# Patient Record
Sex: Female | Born: 1985 | Race: Black or African American | Hispanic: No | Marital: Single | Smoking: Current some day smoker
Health system: Southern US, Community
[De-identification: ages and names within clinical notes are randomized; demographics above are authoritative.]

## PROBLEM LIST (undated history)

## (undated) ENCOUNTER — Inpatient Hospital Stay (HOSPITAL_COMMUNITY): Payer: Self-pay

## (undated) DIAGNOSIS — A749 Chlamydial infection, unspecified: Secondary | ICD-10-CM

## (undated) DIAGNOSIS — F4310 Post-traumatic stress disorder, unspecified: Secondary | ICD-10-CM

## (undated) DIAGNOSIS — B009 Herpesviral infection, unspecified: Secondary | ICD-10-CM

## (undated) DIAGNOSIS — N200 Calculus of kidney: Secondary | ICD-10-CM

## (undated) DIAGNOSIS — B999 Unspecified infectious disease: Secondary | ICD-10-CM

## (undated) DIAGNOSIS — O98211 Gonorrhea complicating pregnancy, first trimester: Secondary | ICD-10-CM

## (undated) DIAGNOSIS — N739 Female pelvic inflammatory disease, unspecified: Secondary | ICD-10-CM

## (undated) DIAGNOSIS — F319 Bipolar disorder, unspecified: Secondary | ICD-10-CM

## (undated) DIAGNOSIS — F32A Depression, unspecified: Secondary | ICD-10-CM

## (undated) DIAGNOSIS — R87619 Unspecified abnormal cytological findings in specimens from cervix uteri: Secondary | ICD-10-CM

## (undated) DIAGNOSIS — O98811 Other maternal infectious and parasitic diseases complicating pregnancy, first trimester: Secondary | ICD-10-CM

## (undated) DIAGNOSIS — N83209 Unspecified ovarian cyst, unspecified side: Secondary | ICD-10-CM

## (undated) DIAGNOSIS — L732 Hidradenitis suppurativa: Secondary | ICD-10-CM

## (undated) DIAGNOSIS — K056 Periodontal disease, unspecified: Secondary | ICD-10-CM

## (undated) DIAGNOSIS — Z8669 Personal history of other diseases of the nervous system and sense organs: Secondary | ICD-10-CM

## (undated) DIAGNOSIS — L309 Dermatitis, unspecified: Secondary | ICD-10-CM

## (undated) DIAGNOSIS — F329 Major depressive disorder, single episode, unspecified: Secondary | ICD-10-CM

## (undated) DIAGNOSIS — Z202 Contact with and (suspected) exposure to infections with a predominantly sexual mode of transmission: Secondary | ICD-10-CM

## (undated) DIAGNOSIS — IMO0002 Reserved for concepts with insufficient information to code with codable children: Secondary | ICD-10-CM

## (undated) DIAGNOSIS — Z8619 Personal history of other infectious and parasitic diseases: Secondary | ICD-10-CM

## (undated) HISTORY — DX: Personal history of other diseases of the nervous system and sense organs: Z86.69

## (undated) HISTORY — DX: Personal history of other infectious and parasitic diseases: Z86.19

## (undated) HISTORY — PX: NO PAST SURGERIES: SHX2092

## (undated) HISTORY — DX: Chlamydial infection, unspecified: A74.9

## (undated) HISTORY — DX: Major depressive disorder, single episode, unspecified: F32.9

## (undated) HISTORY — DX: Reserved for concepts with insufficient information to code with codable children: IMO0002

## (undated) HISTORY — DX: Unspecified abnormal cytological findings in specimens from cervix uteri: R87.619

## (undated) HISTORY — DX: Herpesviral infection, unspecified: B00.9

## (undated) HISTORY — DX: Gonorrhea complicating pregnancy, first trimester: O98.211

## (undated) HISTORY — DX: Other maternal infectious and parasitic diseases complicating pregnancy, first trimester: O98.811

## (undated) HISTORY — DX: Depression, unspecified: F32.A

---
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
## 2004-01-30 ENCOUNTER — Emergency Department (HOSPITAL_COMMUNITY): Admission: EM | Admit: 2004-01-30 | Discharge: 2004-01-30 | Payer: Self-pay | Admitting: Emergency Medicine

## 2004-04-11 ENCOUNTER — Emergency Department (HOSPITAL_COMMUNITY): Admission: EM | Admit: 2004-04-11 | Discharge: 2004-04-11 | Payer: Self-pay | Admitting: Family Medicine

## 2005-08-01 ENCOUNTER — Inpatient Hospital Stay (HOSPITAL_COMMUNITY): Admission: AD | Admit: 2005-08-01 | Discharge: 2005-08-01 | Payer: Self-pay | Admitting: Obstetrics & Gynecology

## 2005-11-19 ENCOUNTER — Emergency Department (HOSPITAL_COMMUNITY): Admission: EM | Admit: 2005-11-19 | Discharge: 2005-11-19 | Payer: Self-pay | Admitting: Family Medicine

## 2006-08-08 ENCOUNTER — Emergency Department (HOSPITAL_COMMUNITY): Admission: EM | Admit: 2006-08-08 | Discharge: 2006-08-08 | Payer: Self-pay | Admitting: Emergency Medicine

## 2007-05-21 ENCOUNTER — Emergency Department (HOSPITAL_COMMUNITY): Admission: EM | Admit: 2007-05-21 | Discharge: 2007-05-21 | Payer: Self-pay | Admitting: Family Medicine

## 2007-05-22 ENCOUNTER — Inpatient Hospital Stay (HOSPITAL_COMMUNITY): Admission: AD | Admit: 2007-05-22 | Discharge: 2007-05-22 | Payer: Self-pay | Admitting: Obstetrics

## 2007-09-02 ENCOUNTER — Emergency Department (HOSPITAL_COMMUNITY): Admission: EM | Admit: 2007-09-02 | Discharge: 2007-09-02 | Payer: Self-pay | Admitting: Emergency Medicine

## 2007-11-18 ENCOUNTER — Emergency Department (HOSPITAL_COMMUNITY): Admission: EM | Admit: 2007-11-18 | Discharge: 2007-11-18 | Payer: Self-pay | Admitting: Family Medicine

## 2007-12-16 ENCOUNTER — Emergency Department (HOSPITAL_COMMUNITY): Admission: EM | Admit: 2007-12-16 | Discharge: 2007-12-16 | Payer: Self-pay | Admitting: Emergency Medicine

## 2008-08-18 ENCOUNTER — Emergency Department (HOSPITAL_COMMUNITY): Admission: EM | Admit: 2008-08-18 | Discharge: 2008-08-18 | Payer: Self-pay | Admitting: Emergency Medicine

## 2008-09-08 ENCOUNTER — Emergency Department (HOSPITAL_COMMUNITY): Admission: EM | Admit: 2008-09-08 | Discharge: 2008-09-08 | Payer: Self-pay | Admitting: Emergency Medicine

## 2009-04-20 ENCOUNTER — Inpatient Hospital Stay (HOSPITAL_COMMUNITY): Admission: AD | Admit: 2009-04-20 | Discharge: 2009-04-20 | Payer: Self-pay | Admitting: Obstetrics

## 2009-09-07 ENCOUNTER — Emergency Department (HOSPITAL_COMMUNITY): Admission: EM | Admit: 2009-09-07 | Discharge: 2009-09-08 | Payer: Self-pay | Admitting: Emergency Medicine

## 2010-07-15 ENCOUNTER — Inpatient Hospital Stay (HOSPITAL_COMMUNITY)
Admission: AD | Admit: 2010-07-15 | Discharge: 2010-07-15 | Disposition: A | Discharge status: Home / Self Care | Payer: Medicaid Other | Source: Ambulatory Visit | Attending: Obstetrics | Admitting: Obstetrics

## 2010-07-15 DIAGNOSIS — N760 Acute vaginitis: Secondary | ICD-10-CM | POA: Insufficient documentation

## 2010-07-15 DIAGNOSIS — N949 Unspecified condition associated with female genital organs and menstrual cycle: Secondary | ICD-10-CM | POA: Insufficient documentation

## 2010-07-15 DIAGNOSIS — A6000 Herpesviral infection of urogenital system, unspecified: Secondary | ICD-10-CM | POA: Insufficient documentation

## 2010-07-15 LAB — WET PREP, GENITAL: Yeast Wet Prep HPF POC: NONE SEEN

## 2010-07-16 LAB — GC/CHLAMYDIA PROBE AMP, GENITAL
Chlamydia, DNA Probe: NEGATIVE
GC Probe Amp, Genital: NEGATIVE

## 2010-09-05 LAB — URINALYSIS, ROUTINE W REFLEX MICROSCOPIC
Nitrite: NEGATIVE
Specific Gravity, Urine: 1.028 (ref 1.005–1.030)
Urobilinogen, UA: 0.2 mg/dL (ref 0.0–1.0)

## 2010-09-05 LAB — GC/CHLAMYDIA PROBE AMP, GENITAL
Chlamydia, DNA Probe: NEGATIVE
GC Probe Amp, Genital: NEGATIVE

## 2010-09-05 LAB — URINE MICROSCOPIC-ADD ON

## 2010-09-05 LAB — WET PREP, GENITAL

## 2010-09-15 LAB — URINALYSIS, ROUTINE W REFLEX MICROSCOPIC
Glucose, UA: NEGATIVE mg/dL
Leukocytes, UA: NEGATIVE
Protein, ur: NEGATIVE mg/dL
Specific Gravity, Urine: 1.025 (ref 1.005–1.030)
pH: 5.5 (ref 5.0–8.0)

## 2010-09-15 LAB — DIFFERENTIAL
Basophils Absolute: 0.1 10*3/uL (ref 0.0–0.1)
Basophils Relative: 2 % — ABNORMAL HIGH (ref 0–1)
Eosinophils Relative: 1 % (ref 0–5)
Monocytes Absolute: 0.5 10*3/uL (ref 0.1–1.0)
Neutro Abs: 2.8 10*3/uL (ref 1.7–7.7)

## 2010-09-15 LAB — CBC
Hemoglobin: 14.1 g/dL (ref 12.0–15.0)
MCHC: 33.9 g/dL (ref 30.0–36.0)
Platelets: 254 10*3/uL (ref 150–400)
RDW: 12.9 % (ref 11.5–15.5)

## 2010-09-15 LAB — URINE MICROSCOPIC-ADD ON

## 2010-09-15 LAB — WET PREP, GENITAL: Yeast Wet Prep HPF POC: NONE SEEN

## 2010-09-15 LAB — GC/CHLAMYDIA PROBE AMP, GENITAL: GC Probe Amp, Genital: NEGATIVE

## 2010-09-23 LAB — COMPREHENSIVE METABOLIC PANEL
ALT: 31 U/L (ref 0–35)
Alkaline Phosphatase: 92 U/L (ref 39–117)
CO2: 25 mEq/L (ref 19–32)
Glucose, Bld: 109 mg/dL — ABNORMAL HIGH (ref 70–99)
Potassium: 3.9 mEq/L (ref 3.5–5.1)
Sodium: 137 mEq/L (ref 135–145)
Total Protein: 8.1 g/dL (ref 6.0–8.3)

## 2010-09-23 LAB — URINALYSIS, ROUTINE W REFLEX MICROSCOPIC
Glucose, UA: NEGATIVE mg/dL
pH: 6 (ref 5.0–8.0)

## 2010-09-23 LAB — DIFFERENTIAL
Basophils Relative: 0 % (ref 0–1)
Eosinophils Absolute: 0 10*3/uL (ref 0.0–0.7)
Monocytes Relative: 4 % (ref 3–12)
Neutrophils Relative %: 80 % — ABNORMAL HIGH (ref 43–77)

## 2010-09-23 LAB — URINE MICROSCOPIC-ADD ON

## 2010-09-23 LAB — GLUCOSE, CAPILLARY

## 2010-09-23 LAB — POCT PREGNANCY, URINE: Preg Test, Ur: NEGATIVE

## 2010-09-23 LAB — RAPID URINE DRUG SCREEN, HOSP PERFORMED: Barbiturates: NOT DETECTED

## 2010-09-23 LAB — CBC
Hemoglobin: 15.7 g/dL — ABNORMAL HIGH (ref 12.0–15.0)
RBC: 5.14 MIL/uL — ABNORMAL HIGH (ref 3.87–5.11)
RDW: 12.7 % (ref 11.5–15.5)
WBC: 7 10*3/uL (ref 4.0–10.5)

## 2010-12-05 LAB — RPR: RPR: NONREACTIVE

## 2010-12-05 LAB — GC/CHLAMYDIA PROBE AMP, GENITAL: Chlamydia: NEGATIVE

## 2010-12-13 ENCOUNTER — Inpatient Hospital Stay (HOSPITAL_COMMUNITY)
Admission: AD | Admit: 2010-12-13 | Discharge: 2010-12-13 | Disposition: A | Discharge status: Home / Self Care | Payer: Medicaid Other | Source: Ambulatory Visit | Attending: Obstetrics | Admitting: Obstetrics

## 2010-12-13 DIAGNOSIS — O209 Hemorrhage in early pregnancy, unspecified: Secondary | ICD-10-CM

## 2010-12-13 LAB — CBC
MCH: 30 pg (ref 26.0–34.0)
MCHC: 34 g/dL (ref 30.0–36.0)
MCV: 88.2 fL (ref 78.0–100.0)
Platelets: 245 10*3/uL (ref 150–400)
RDW: 12.5 % (ref 11.5–15.5)

## 2010-12-14 LAB — RH IMMUNE GLOBULIN WORKUP (NOT WOMEN'S HOSP): Unit division: 0

## 2011-02-21 ENCOUNTER — Encounter (HOSPITAL_COMMUNITY): Payer: Self-pay | Admitting: *Deleted

## 2011-02-21 ENCOUNTER — Inpatient Hospital Stay (HOSPITAL_COMMUNITY)
Admission: AD | Admit: 2011-02-21 | Discharge: 2011-02-21 | Disposition: A | Discharge status: Home / Self Care | Payer: Medicaid Other | Source: Ambulatory Visit | Attending: Obstetrics | Admitting: Obstetrics

## 2011-02-21 DIAGNOSIS — R109 Unspecified abdominal pain: Secondary | ICD-10-CM

## 2011-02-21 DIAGNOSIS — O26899 Other specified pregnancy related conditions, unspecified trimester: Secondary | ICD-10-CM

## 2011-02-21 DIAGNOSIS — O4700 False labor before 37 completed weeks of gestation, unspecified trimester: Secondary | ICD-10-CM | POA: Insufficient documentation

## 2011-02-21 LAB — URINALYSIS, ROUTINE W REFLEX MICROSCOPIC
Nitrite: NEGATIVE
Protein, ur: NEGATIVE mg/dL
Specific Gravity, Urine: >1.03 — ABNORMAL HIGH (ref 1.005–1.030)
Urobilinogen, UA: 1 mg/dL (ref 0.0–1.0)

## 2011-02-21 NOTE — ED Provider Notes (Signed)
History   Pt presents today via EMS c/o ctx that started about 1hr ago. She denis vag dc or bleeding. She reports GFM. Her last episode of intercourse was last night.   Chief Complaint  Patient presents with  . Abdominal Pain   HPI  OB History    Grav Para Term Preterm Abortions TAB SAB Ect Mult Living   1               No past medical history on file.  No past surgical history on file.  No family history on file.  History  Substance Use Topics  . Smoking status: Not on file  . Smokeless tobacco: Not on file  . Alcohol Use: Not on file    Allergies: Allergies not on file  No prescriptions prior to admission    Review of Systems  Constitutional: Negative for fever.  Cardiovascular: Negative for chest pain.  Gastrointestinal: Positive for abdominal pain. Negative for nausea, vomiting, diarrhea and constipation.  Genitourinary: Negative for dysuria, urgency, frequency and hematuria.  Neurological: Negative for dizziness and headaches.  Psychiatric/Behavioral: Negative for depression and suicidal ideas.   Physical Exam   Blood pressure 114/61, pulse 79, temperature 98.2 F (36.8 C), temperature source Oral, resp. rate 20.  Physical Exam  Constitutional: She is oriented to person, place, and time. She appears well-developed and well-nourished. No distress.  HENT:  Head: Normocephalic and atraumatic.  Eyes: EOM are normal. Pupils are equal, round, and reactive to light.  GI: Soft. She exhibits no distension. There is no tenderness. There is no rebound and no guarding.  Genitourinary: No bleeding around the vagina. No vaginal discharge found.       Cervix is Lg/closed and firm.  Neurological: She is alert and oriented to person, place, and time.  Skin: Skin is warm and dry. She is not diaphoretic.  Psychiatric: She has a normal mood and affect. Her behavior is normal. Judgment and thought content normal.    MAU Course  Procedures  NST shows no ctx.  Results  for orders placed during the hospital encounter of 02/21/11 (from the past 48 hour(s))  URINALYSIS, ROUTINE W REFLEX MICROSCOPIC     Status: Abnormal   Collection Time   02/21/11  4:29 PM      Component Value Range Comment   Color, Urine YELLOW  YELLOW     Appearance CLEAR  CLEAR     Specific Gravity, Urine >1.030 (*) 1.005 - 1.030     pH 6.0  5.0 - 8.0     Glucose, UA NEGATIVE  NEGATIVE (mg/dL)    Hgb urine dipstick NEGATIVE  NEGATIVE     Bilirubin Urine NEGATIVE  NEGATIVE     Ketones, ur NEGATIVE  NEGATIVE (mg/dL)    Protein, ur NEGATIVE  NEGATIVE (mg/dL)    Urobilinogen, UA 1.0  0.0 - 1.0 (mg/dL)    Nitrite NEGATIVE  NEGATIVE     Leukocytes, UA NEGATIVE  NEGATIVE  MICROSCOPIC NOT DONE ON URINES WITH NEGATIVE PROTEIN, BLOOD, LEUKOCYTES, NITRITE, OR GLUCOSE <1000 mg/dL.     Assessment and Plan  Abd pain: no evidence of PTL at this time. Pt sx most likely secondary to recent intercourse. Pt sx have now completely resolved. She has f/u scheduled. Discussed diet, activity, risks, and precautions.  Clinton Gallant. Tyrek Lawhorn III, DrHSc, MPAS, PA-C  02/21/2011, 4:18 PM   Henrietta Hoover, PA 02/21/11 1654

## 2011-02-21 NOTE — ED Notes (Signed)
Bedside US confirms vtx position

## 2011-02-21 NOTE — Progress Notes (Signed)
States started having pain around 3 p.m.  No bleeding or leaking.  Had intercourse last nighj.  Came in by EMS

## 2011-03-07 LAB — COMPREHENSIVE METABOLIC PANEL
ALT: 27
AST: 27
Albumin: 4.3
Alkaline Phosphatase: 98
CO2: 22
Chloride: 102
Creatinine, Ser: 0.91
GFR calc Af Amer: >60
GFR calc non Af Amer: >60
Potassium: 3.1 — ABNORMAL LOW
Sodium: 135
Total Bilirubin: 1.1

## 2011-03-07 LAB — DIFFERENTIAL
Basophils Absolute: 0
Basophils Relative: 0
Eosinophils Absolute: 0
Eosinophils Relative: 0
Lymphocytes Relative: 14
Monocytes Absolute: 0.4

## 2011-03-07 LAB — URINALYSIS, ROUTINE W REFLEX MICROSCOPIC
Hgb urine dipstick: NEGATIVE
Ketones, ur: 40 — AB
Protein, ur: 30 — AB
Urobilinogen, UA: 0.2

## 2011-03-07 LAB — LIPASE, BLOOD: Lipase: 25

## 2011-03-07 LAB — POCT PREGNANCY, URINE: Operator id: 108131

## 2011-03-07 LAB — URINE MICROSCOPIC-ADD ON

## 2011-03-07 LAB — CBC
MCV: 86.9
Platelets: 259
RBC: 5.02
WBC: 7.5

## 2011-03-10 LAB — URINALYSIS, ROUTINE W REFLEX MICROSCOPIC
Bilirubin Urine: NEGATIVE
Hgb urine dipstick: NEGATIVE
Ketones, ur: 40 — AB
Nitrite: NEGATIVE
Specific Gravity, Urine: 1.041 — ABNORMAL HIGH
Urobilinogen, UA: 0.2

## 2011-03-10 LAB — POCT I-STAT, CHEM 8
BUN: 14
Calcium, Ion: 1.09 — ABNORMAL LOW
Chloride: 100
HCT: 51 — ABNORMAL HIGH
Sodium: 135
TCO2: 26

## 2011-03-10 LAB — POCT PREGNANCY, URINE
Operator id: 261601
Preg Test, Ur: NEGATIVE

## 2011-03-10 LAB — URINE MICROSCOPIC-ADD ON

## 2011-03-21 LAB — POCT URINALYSIS DIP (DEVICE)
Hgb urine dipstick: NEGATIVE
Nitrite: NEGATIVE
Urobilinogen, UA: 2 — ABNORMAL HIGH
pH: 6.5

## 2011-03-21 LAB — POCT PREGNANCY, URINE
Operator id: 116391
Preg Test, Ur: NEGATIVE

## 2011-03-21 LAB — WET PREP, GENITAL

## 2011-04-12 ENCOUNTER — Other Ambulatory Visit: Payer: Self-pay | Admitting: Obstetrics

## 2011-04-20 ENCOUNTER — Inpatient Hospital Stay (HOSPITAL_COMMUNITY)
Admission: AD | Admit: 2011-04-20 | Discharge: 2011-04-20 | Disposition: A | Discharge status: Home / Self Care | Payer: Medicaid Other | Source: Ambulatory Visit | Attending: Obstetrics | Admitting: Obstetrics

## 2011-04-20 DIAGNOSIS — Z3480 Encounter for supervision of other normal pregnancy, unspecified trimester: Secondary | ICD-10-CM | POA: Insufficient documentation

## 2011-04-20 DIAGNOSIS — Z298 Encounter for other specified prophylactic measures: Secondary | ICD-10-CM | POA: Insufficient documentation

## 2011-04-20 DIAGNOSIS — Z2989 Encounter for other specified prophylactic measures: Secondary | ICD-10-CM | POA: Insufficient documentation

## 2011-04-20 MED ORDER — RHO D IMMUNE GLOBULIN 1500 UNIT/2ML IJ SOLN
300.0000 ug | Freq: Once | INTRAMUSCULAR | Status: AC
  Administered 2011-04-20: 300 ug via INTRAMUSCULAR
  Filled 2011-04-20: qty 2

## 2011-04-20 NOTE — Plan of Care (Signed)
Rhophylac information sheet given to the patient waiting in the lobby. Explained the time required to process the order of 1 1/2 hours. Patient understands.

## 2011-04-21 LAB — RH IG WORKUP (INCLUDES ABO/RH): Antibody Screen: NEGATIVE

## 2011-05-22 ENCOUNTER — Inpatient Hospital Stay (HOSPITAL_COMMUNITY)
Admission: AD | Admit: 2011-05-22 | Discharge: 2011-05-23 | Disposition: A | Discharge status: Home / Self Care | Payer: Medicaid Other | Source: Ambulatory Visit | Attending: Obstetrics | Admitting: Obstetrics

## 2011-05-22 DIAGNOSIS — L02219 Cutaneous abscess of trunk, unspecified: Secondary | ICD-10-CM | POA: Insufficient documentation

## 2011-05-22 DIAGNOSIS — O99891 Other specified diseases and conditions complicating pregnancy: Secondary | ICD-10-CM | POA: Insufficient documentation

## 2011-05-22 DIAGNOSIS — L03319 Cellulitis of trunk, unspecified: Secondary | ICD-10-CM | POA: Insufficient documentation

## 2011-05-22 DIAGNOSIS — O4700 False labor before 37 completed weeks of gestation, unspecified trimester: Secondary | ICD-10-CM | POA: Insufficient documentation

## 2011-05-22 DIAGNOSIS — L02214 Cutaneous abscess of groin: Secondary | ICD-10-CM

## 2011-05-22 MED ORDER — CEPHALEXIN 500 MG PO CAPS
500.0000 mg | ORAL_CAPSULE | Freq: Once | ORAL | Status: AC
  Administered 2011-05-22: 500 mg via ORAL
  Filled 2011-05-22: qty 1

## 2011-05-22 MED ORDER — OXYCODONE-ACETAMINOPHEN 5-325 MG PO TABS
2.0000 | ORAL_TABLET | Freq: Once | ORAL | Status: AC
  Administered 2011-05-22: 2 via ORAL
  Filled 2011-05-22: qty 2

## 2011-05-22 NOTE — Progress Notes (Signed)
Pt c/o pain in right groin.  States she felt it coming on a few days ago and it got worse today; says she can't walk or close her legs.

## 2011-05-22 NOTE — ED Provider Notes (Signed)
History     No chief complaint on file.  HPI This is a 25 y.o. female at [redacted]w[redacted]d who presents via EMS with c/o painful right groin lesion. States it started two days ago and has gotten progressively worse. Also complains of pelvic pressure and frequency.    OB History    Grav Para Term Preterm Abortions TAB SAB Ect Mult Living   1               No past medical history on file.  No past surgical history on file.  No family history on file.  History  Substance Use Topics  . Smoking status: Not on file  . Smokeless tobacco: Not on file  . Alcohol Use: Not on file    Allergies: No Known Allergies  Prescriptions prior to admission  Medication Sig Dispense Refill  . prenatal vitamin w/FE, FA (PRENATAL 1 + 1) 27-1 MG TABS Take 1 tablet by mouth daily.        . valACYclovir (VALTREX) 1000 MG tablet Take 500 mg by mouth daily.          ROS As above  Physical Exam   There were no vitals taken for this visit.  Physical Exam  Constitutional: She is oriented to person, place, and time. She appears well-developed and well-nourished.  HENT:  Head: Normocephalic.  Cardiovascular: Normal rate.   Respiratory: Effort normal.  GI: Soft.  Genitourinary: Vagina normal and uterus normal. No vaginal discharge found.        FHR reactive UCs irregular  Musculoskeletal: Normal range of motion.  Neurological: She is alert and oriented to person, place, and time.  Skin: Skin is warm and dry.       There is a quarter sized abscess on Right Groin which is firm and not fluctuant. There is no point to it. + erethema. Slightly elongated.   Psychiatric: She has a normal mood and affect.    MAU Course  Procedures  MDM   Assessment and Plan  A:  Right Groin Abscess      Contractions and pelvic discomfort related to late pregnancy P:  D/C home per Dr Gaynell Face      Rx Keflex 500mg  qid x 7 days      Rx Percocet      Given one dose of each while here      Follow up in  office  [facility] 05/22/2011, 11:24 PM

## 2011-05-23 ENCOUNTER — Encounter (HOSPITAL_COMMUNITY): Payer: Self-pay | Admitting: Advanced Practice Midwife

## 2011-05-23 MED ORDER — OXYCODONE-ACETAMINOPHEN 5-325 MG PO TABS: 1.0000 | ORAL_TABLET | ORAL | Status: AC | PRN

## 2011-05-23 MED ORDER — CEPHALEXIN 500 MG PO CAPS: 500.0000 mg | ORAL_CAPSULE | Freq: Four times a day (QID) | ORAL | Status: AC

## 2011-06-14 NOTE — L&D Delivery Note (Signed)
Delivery Note At 3:53 PM a viable female was delivered via Vaginal, Spontaneous Delivery (Presentation: Middle Occiput Anterior).  APGAR: 8, 9; weight 7 lb 1.1 oz (3205 g).   Placenta status: , .  Cord:  with the following complications: None.  Cord pH: not done  Anesthesia: Epidural  Episiotomy:  Lacerations:  Suture Repair: 2.0 Est. Blood Loss (mL):   Mom to postpartum.  Baby to nursery-stable.  Nobuo Nunziata A 06/28/2011, 4:05 PM

## 2011-06-23 ENCOUNTER — Encounter (HOSPITAL_COMMUNITY): Payer: Self-pay | Admitting: *Deleted

## 2011-06-23 ENCOUNTER — Telehealth (HOSPITAL_COMMUNITY): Payer: Self-pay | Admitting: *Deleted

## 2011-06-23 NOTE — Telephone Encounter (Signed)
Preadmission screen  

## 2011-06-28 ENCOUNTER — Inpatient Hospital Stay (HOSPITAL_COMMUNITY)
Admission: AD | Admit: 2011-06-28 | Discharge: 2011-06-30 | DRG: 775 | Disposition: A | Discharge status: Home / Self Care | Payer: Medicaid Other | Source: Ambulatory Visit | Attending: Obstetrics | Admitting: Obstetrics

## 2011-06-28 ENCOUNTER — Inpatient Hospital Stay (HOSPITAL_COMMUNITY): Payer: Medicaid Other | Admitting: Anesthesiology

## 2011-06-28 ENCOUNTER — Encounter (HOSPITAL_COMMUNITY): Payer: Self-pay | Admitting: Anesthesiology

## 2011-06-28 ENCOUNTER — Encounter (HOSPITAL_COMMUNITY): Payer: Self-pay | Admitting: *Deleted

## 2011-06-28 LAB — RH IG WORKUP (INCLUDES ABO/RH)
ABO/RH(D): B NEG
Fetal Screen: NEGATIVE
Unit division: 0

## 2011-06-28 LAB — CBC
HCT: 38.6 % (ref 36.0–46.0)
Platelets: 227 10*3/uL (ref 150–400)
RDW: 13.8 % (ref 11.5–15.5)
WBC: 8.2 10*3/uL (ref 4.0–10.5)

## 2011-06-28 LAB — STREP B DNA PROBE: GBS: NEGATIVE

## 2011-06-28 MED ORDER — EPHEDRINE 5 MG/ML INJ: 10.0000 mg | INTRAVENOUS | Status: DC | PRN

## 2011-06-28 MED ORDER — LACTATED RINGERS IV SOLN
INTRAVENOUS | Status: DC
  Administered 2011-06-28: 125 mL/h via INTRAVENOUS
  Administered 2011-06-28: 07:00:00 via INTRAVENOUS

## 2011-06-28 MED ORDER — LACTATED RINGERS IV SOLN
500.0000 mL | INTRAVENOUS | Status: DC | PRN
  Administered 2011-06-28: 500 mL via INTRAVENOUS

## 2011-06-28 MED ORDER — ONDANSETRON HCL 4 MG/2ML IJ SOLN: 4.0000 mg | INTRAMUSCULAR | Status: DC | PRN

## 2011-06-28 MED ORDER — OXYTOCIN BOLUS FROM INFUSION
500.0000 mL | Freq: Once | INTRAVENOUS | Status: DC
  Administered 2011-06-28: 500 mL via INTRAVENOUS
  Filled 2011-06-28: qty 500

## 2011-06-28 MED ORDER — ONDANSETRON HCL 4 MG/2ML IJ SOLN
4.0000 mg | Freq: Four times a day (QID) | INTRAMUSCULAR | Status: DC | PRN
  Administered 2011-06-28: 4 mg via INTRAVENOUS
  Filled 2011-06-28: qty 2

## 2011-06-28 MED ORDER — LACTATED RINGERS IV SOLN: 500.0000 mL | Freq: Once | INTRAVENOUS | Status: DC

## 2011-06-28 MED ORDER — PHENYLEPHRINE 40 MCG/ML (10ML) SYRINGE FOR IV PUSH (FOR BLOOD PRESSURE SUPPORT)
80.0000 ug | PREFILLED_SYRINGE | INTRAVENOUS | Status: DC | PRN
  Filled 2011-06-28: qty 5

## 2011-06-28 MED ORDER — EPHEDRINE 5 MG/ML INJ
10.0000 mg | INTRAVENOUS | Status: DC | PRN
  Filled 2011-06-28: qty 4

## 2011-06-28 MED ORDER — TETANUS-DIPHTH-ACELL PERTUSSIS 5-2.5-18.5 LF-MCG/0.5 IM SUSP: 0.5000 mL | Freq: Once | INTRAMUSCULAR | Status: DC

## 2011-06-28 MED ORDER — LIDOCAINE HCL (PF) 1 % IJ SOLN: 30.0000 mL | INTRAMUSCULAR | Status: DC | PRN

## 2011-06-28 MED ORDER — OXYCODONE-ACETAMINOPHEN 5-325 MG PO TABS: 2.0000 | ORAL_TABLET | ORAL | Status: DC | PRN

## 2011-06-28 MED ORDER — DIBUCAINE 1 % RE OINT: 1.0000 "application " | TOPICAL_OINTMENT | RECTAL | Status: DC | PRN

## 2011-06-28 MED ORDER — BENZOCAINE-MENTHOL 20-0.5 % EX AERO
1.0000 "application " | INHALATION_SPRAY | CUTANEOUS | Status: DC | PRN
  Administered 2011-06-28: 1 via TOPICAL

## 2011-06-28 MED ORDER — SENNOSIDES-DOCUSATE SODIUM 8.6-50 MG PO TABS
2.0000 | ORAL_TABLET | Freq: Every day | ORAL | Status: DC
  Administered 2011-06-29: 2 via ORAL

## 2011-06-28 MED ORDER — ZOLPIDEM TARTRATE 5 MG PO TABS: 5.0000 mg | ORAL_TABLET | Freq: Every evening | ORAL | Status: DC | PRN

## 2011-06-28 MED ORDER — OXYTOCIN 20 UNITS IN LACTATED RINGERS INFUSION - SIMPLE: 125.0000 mL/h | Freq: Once | INTRAVENOUS | Status: DC

## 2011-06-28 MED ORDER — SIMETHICONE 80 MG PO CHEW: 80.0000 mg | CHEWABLE_TABLET | ORAL | Status: DC | PRN

## 2011-06-28 MED ORDER — IBUPROFEN 600 MG PO TABS: 600.0000 mg | ORAL_TABLET | Freq: Four times a day (QID) | ORAL | Status: DC | PRN

## 2011-06-28 MED ORDER — SODIUM BICARBONATE 8.4 % IV SOLN
INTRAVENOUS | Status: DC | PRN
  Administered 2011-06-28: 4 mL via EPIDURAL

## 2011-06-28 MED ORDER — PHENYLEPHRINE 40 MCG/ML (10ML) SYRINGE FOR IV PUSH (FOR BLOOD PRESSURE SUPPORT): 80.0000 ug | PREFILLED_SYRINGE | INTRAVENOUS | Status: DC | PRN

## 2011-06-28 MED ORDER — ACETAMINOPHEN 325 MG PO TABS: 650.0000 mg | ORAL_TABLET | ORAL | Status: DC | PRN

## 2011-06-28 MED ORDER — IBUPROFEN 600 MG PO TABS
600.0000 mg | ORAL_TABLET | Freq: Four times a day (QID) | ORAL | Status: DC
  Administered 2011-06-28 – 2011-06-30 (×8): 600 mg via ORAL
  Filled 2011-06-28 (×7): qty 1

## 2011-06-28 MED ORDER — LANOLIN HYDROUS EX OINT: TOPICAL_OINTMENT | CUTANEOUS | Status: DC | PRN

## 2011-06-28 MED ORDER — DIPHENHYDRAMINE HCL 25 MG PO CAPS
25.0000 mg | ORAL_CAPSULE | Freq: Four times a day (QID) | ORAL | Status: DC | PRN
  Administered 2011-06-28: 25 mg via ORAL
  Filled 2011-06-28: qty 1

## 2011-06-28 MED ORDER — PRENATAL MULTIVITAMIN CH
1.0000 | ORAL_TABLET | Freq: Every day | ORAL | Status: DC
  Administered 2011-06-29 – 2011-06-30 (×2): 1 via ORAL
  Filled 2011-06-28 (×2): qty 1

## 2011-06-28 MED ORDER — ONDANSETRON HCL 4 MG PO TABS: 4.0000 mg | ORAL_TABLET | ORAL | Status: DC | PRN

## 2011-06-28 MED ORDER — OXYTOCIN 20 UNITS IN LACTATED RINGERS INFUSION - SIMPLE
1.0000 m[IU]/min | INTRAVENOUS | Status: DC
  Administered 2011-06-28: 1 m[IU]/min via INTRAVENOUS
  Filled 2011-06-28: qty 1000

## 2011-06-28 MED ORDER — FLEET ENEMA 7-19 GM/118ML RE ENEM: 1.0000 | ENEMA | RECTAL | Status: DC | PRN

## 2011-06-28 MED ORDER — CITRIC ACID-SODIUM CITRATE 334-500 MG/5ML PO SOLN: 30.0000 mL | ORAL | Status: DC | PRN

## 2011-06-28 MED ORDER — WITCH HAZEL-GLYCERIN EX PADS: 1.0000 "application " | MEDICATED_PAD | CUTANEOUS | Status: DC | PRN

## 2011-06-28 MED ORDER — PROMETHAZINE HCL 25 MG/ML IJ SOLN: 12.5000 mg | INTRAMUSCULAR | Status: DC | PRN

## 2011-06-28 MED ORDER — FERROUS SULFATE 325 (65 FE) MG PO TABS
325.0000 mg | ORAL_TABLET | Freq: Two times a day (BID) | ORAL | Status: DC
  Administered 2011-06-29 – 2011-06-30 (×3): 325 mg via ORAL
  Filled 2011-06-28 (×4): qty 1

## 2011-06-28 MED ORDER — FENTANYL 2.5 MCG/ML BUPIVACAINE 1/10 % EPIDURAL INFUSION (WH - ANES)
14.0000 mL/h | INTRAMUSCULAR | Status: DC
  Administered 2011-06-28 (×2): 14 mL/h via EPIDURAL
  Filled 2011-06-28 (×3): qty 60

## 2011-06-28 MED ORDER — BUTORPHANOL TARTRATE 2 MG/ML IJ SOLN: 1.0000 mg | INTRAMUSCULAR | Status: DC | PRN

## 2011-06-28 MED ORDER — DIPHENHYDRAMINE HCL 50 MG/ML IJ SOLN: 12.5000 mg | INTRAMUSCULAR | Status: DC | PRN

## 2011-06-28 MED ORDER — TERBUTALINE SULFATE 1 MG/ML IJ SOLN: 0.2500 mg | Freq: Once | INTRAMUSCULAR | Status: DC | PRN

## 2011-06-28 MED ORDER — BENZOCAINE-MENTHOL 20-0.5 % EX AERO
INHALATION_SPRAY | CUTANEOUS | Status: AC
  Administered 2011-06-28: 1 via TOPICAL
  Filled 2011-06-28: qty 56

## 2011-06-28 MED ORDER — OXYCODONE-ACETAMINOPHEN 5-325 MG PO TABS
1.0000 | ORAL_TABLET | ORAL | Status: DC | PRN
  Administered 2011-06-28 – 2011-06-29 (×5): 1 via ORAL
  Administered 2011-06-29: 2 via ORAL
  Administered 2011-06-30 (×2): 1 via ORAL
  Filled 2011-06-28 (×9): qty 1

## 2011-06-28 MED ORDER — FENTANYL 2.5 MCG/ML BUPIVACAINE 1/10 % EPIDURAL INFUSION (WH - ANES)
INTRAMUSCULAR | Status: DC | PRN
  Administered 2011-06-28: 14 mL/h via EPIDURAL

## 2011-06-28 NOTE — Progress Notes (Signed)
Maternal tracing noted with pt sitting directly up in bed.

## 2011-06-28 NOTE — Progress Notes (Signed)
1553 spontaneous vaginal delivery of viable female apgars 8 and 0

## 2011-06-28 NOTE — Anesthesia Procedure Notes (Signed)

## 2011-06-28 NOTE — Progress Notes (Signed)
Pt up to bathroom at this time

## 2011-06-28 NOTE — Progress Notes (Signed)
Notified of pt presenting for labor eval.  Notified of VE and contraction pattern.  Order to monitor for an hour and recheck cervix.  Call Dr. Gaynell Face with VE.

## 2011-06-28 NOTE — Progress Notes (Signed)
Pt presents to mau for contractions that started around 0700 yesterday. States + fetal movement.

## 2011-06-28 NOTE — Anesthesia Preprocedure Evaluation (Signed)
Anesthesia Evaluation  Patient identified by MRN, date of birth, ID band Patient awake    Reviewed: Allergy & Precautions, H&P , Patient's Chart, lab work & pertinent test results  Airway Mallampati: II TM Distance: >3 FB Neck ROM: full    Dental  (+) Teeth Intact   Pulmonary  clear to auscultation        Cardiovascular regular Normal    Neuro/Psych    GI/Hepatic   Endo/Other    Renal/GU      Musculoskeletal   Abdominal   Peds  Hematology   Anesthesia Other Findings       Reproductive/Obstetrics (+) Pregnancy                           Anesthesia Physical Anesthesia Plan  ASA: III  Anesthesia Plan: Epidural   Post-op Pain Management:    Induction:   Airway Management Planned:   Additional Equipment:   Intra-op Plan:   Post-operative Plan:   Informed Consent: I have reviewed the patients History and Physical, chart, labs and discussed the procedure including the risks, benefits and alternatives for the proposed anesthesia with the patient or authorized representative who has indicated his/her understanding and acceptance.   Dental Advisory Given  Plan Discussed with:   Anesthesia Plan Comments: (Labs checked- platelets confirmed with RN in room. Fetal heart tracing, per RN, reported to be stable enough for sitting procedure. Discussed epidural, and patient consents to the procedure:  included risk of possible headache,backache, failed block, allergic reaction, and nerve injury. This patient was asked if she had any questions or concerns before the procedure started. )        Anesthesia Quick Evaluation  

## 2011-06-28 NOTE — Progress Notes (Signed)
Notified of minimal cervical change.  Notified of FHR and contraction pattern.  Admit orders received.

## 2011-06-28 NOTE — H&P (Signed)
And and and is Dr. Francoise Ceo dictating the history and physical on  Diamond Burton she's a 26 year old gravida 2 para 101 at 40 weeks and 4 days is now in labor and he is due on 1113 cervix is 3 cm 90% vertex -2-3 membranes were ruptured artificially the fluid clear IUPC was inserted her GBS was negative patient was is Rh- and she received RhoGAM she also has a history of herpes and has been on Valtrex 500 mg daily throat pregnancy she is contracting irregularly Past medical history history of herpes Past surgical history negative Social history negative System review negative Physical exam revealed a well-developed female in labor HEENT negative Heart regular rhythm no murmurs no gallops Breasts negative Abdomen term estimated fetal weight 7 pounds Pelvic as described above Extremities negative and and

## 2011-06-29 ENCOUNTER — Inpatient Hospital Stay (HOSPITAL_COMMUNITY): Admission: RE | Admit: 2011-06-29 | Payer: Medicaid Other | Source: Ambulatory Visit

## 2011-06-29 LAB — CBC
HCT: 35.1 % — ABNORMAL LOW (ref 36.0–46.0)
Hemoglobin: 12 g/dL (ref 12.0–15.0)
RDW: 13.8 % (ref 11.5–15.5)
WBC: 12.4 10*3/uL — ABNORMAL HIGH (ref 4.0–10.5)

## 2011-06-29 MED ORDER — RHO D IMMUNE GLOBULIN 1500 UNIT/2ML IJ SOLN
300.0000 ug | Freq: Once | INTRAMUSCULAR | Status: AC
  Administered 2011-06-29: 300 ug via INTRAMUSCULAR
  Filled 2011-06-29: qty 2

## 2011-06-29 NOTE — Anesthesia Postprocedure Evaluation (Signed)
  Anesthesia Post-op Note  Patient: Diamond Burton  Procedure(s) Performed: * No procedures listed *  Patient Location: Mother/Baby  Anesthesia Type: Epidural  Level of Consciousness: awake, alert  and oriented  Airway and Oxygen Therapy: Patient Spontanous Breathing  Post-op Pain: mild  Post-op Assessment: Patient's Cardiovascular Status Stable, Respiratory Function Stable, Patent Airway, No signs of Nausea or vomiting and Pain level controlled  Post-op Vital Signs: stable  Complications: No apparent anesthesia complications

## 2011-06-29 NOTE — Progress Notes (Signed)
UR chart review completed.  

## 2011-06-29 NOTE — Progress Notes (Signed)
Patient ID: Diamond Burton, female   DOB: Dec 01, 1985, 26 y.o.   MRN: [id number] Postpartum day one Vital signs normal Fundus firm Lochia moderate Legs negative doing well

## 2011-06-30 NOTE — Progress Notes (Signed)
Pt was discharged before Sw could see. Referral reason, history of depression.  

## 2011-06-30 NOTE — Discharge Summary (Signed)
Obstetric Discharge Summary Reason for Admission: onset of labor Prenatal Procedures: none Intrapartum Procedures: spontaneous vaginal delivery Postpartum Procedures: none Complications-Operative and Postpartum: none Hemoglobin  Date Value Range Status  06/29/2011 12.0  12.0-15.0 (g/dL) Final     HCT  Date Value Range Status  06/29/2011 35.1* 36.0-46.0 (%) Final    Discharge Diagnoses: Term Pregnancy-delivered  Discharge Information: Date: 06/30/2011 Activity: pelvic rest Diet: routine Medications: Percocet Condition: stable Instructions: refer to practice specific booklet Discharge to: home Follow-up Information    Follow up with Shelva Hetzer A, MD. Call in 6 weeks.   Contact information:   [address] [place] [address] [phone]          Newborn Data: Live born female  Birth Weight: 7 lb 1.1 oz (3205 g) APGAR: 8, 9  Home with mother.  Diamond Burton A 06/30/2011, 7:01 AM

## 2011-07-11 ENCOUNTER — Other Ambulatory Visit: Payer: Self-pay | Admitting: Obstetrics

## 2011-10-08 ENCOUNTER — Emergency Department (HOSPITAL_COMMUNITY)
Admission: EM | Admit: 2011-10-08 | Discharge: 2011-10-08 | Disposition: A | Discharge status: Home / Self Care | Payer: Medicaid Other | Attending: Emergency Medicine | Admitting: Emergency Medicine

## 2011-10-08 ENCOUNTER — Encounter (HOSPITAL_COMMUNITY): Payer: Self-pay | Admitting: *Deleted

## 2011-10-08 DIAGNOSIS — X838XXA Intentional self-harm by other specified means, initial encounter: Secondary | ICD-10-CM | POA: Insufficient documentation

## 2011-10-08 DIAGNOSIS — S61519A Laceration without foreign body of unspecified wrist, initial encounter: Secondary | ICD-10-CM

## 2011-10-08 DIAGNOSIS — S61509A Unspecified open wound of unspecified wrist, initial encounter: Secondary | ICD-10-CM | POA: Insufficient documentation

## 2011-10-08 MED ORDER — TETANUS-DIPHTH-ACELL PERTUSSIS 5-2.5-18.5 LF-MCG/0.5 IM SUSP
0.5000 mL | Freq: Once | INTRAMUSCULAR | Status: AC
  Administered 2011-10-08: 0.5 mL via INTRAMUSCULAR
  Filled 2011-10-08: qty 0.5

## 2011-10-08 MED ORDER — BACITRACIN ZINC 500 UNIT/GM EX OINT
TOPICAL_OINTMENT | CUTANEOUS | Status: AC
  Administered 2011-10-08: 1
  Filled 2011-10-08: qty 3.6

## 2011-10-08 NOTE — Discharge Instructions (Signed)
Laceration Care, Adult A laceration is a cut that goes through all layers of the skin. The cut goes into the tissue beneath the skin. HOME CARE For stitches (sutures) or staples:  Keep the cut clean and dry.   If you have a bandage (dressing), change it at least once a day. Change the bandage if it gets wet or dirty, or as told by your doctor.   Wash the cut with soap and water 2 times a day. Rinse the cut with water. Pat it dry with a clean towel.   Put a thin layer of medicated cream on the cut as told by your doctor.   You may shower after the first 24 hours. Do not soak the cut in water until the stitches are removed.   Only take medicines as told by your doctor.   Have your stitches or staples removed as told by your doctor.  For skin adhesive strips:  Keep the cut clean and dry.   Do not get the strips wet. You may take a bath, but be careful to keep the cut dry.   If the cut gets wet, pat it dry with a clean towel.   The strips will fall off on their own. Do not remove the strips that are still stuck to the cut.  For wound glue:  You may shower or take baths. Do not soak or scrub the cut. Do not swim. Avoid heavy sweating until the glue falls off on its own. After a shower or bath, pat the cut dry with a clean towel.   Do not put medicine on your cut until the glue falls off.   If you have a bandage, do not put tape over the glue.   Avoid lots of sunlight or tanning lamps until the glue falls off. Put sunscreen on the cut for the first year to reduce your scar.   The glue will fall off on its own. Do not pick at the glue.  You may need a tetanus shot if:  You cannot remember when you had your last tetanus shot.   You have never had a tetanus shot.  If you need a tetanus shot and you choose not to have one, you may get tetanus. Sickness from tetanus can be serious. GET HELP RIGHT AWAY IF:   Your pain does not get better with medicine.   Your arm, hand, leg, or  foot loses feeling (numbness) or changes color.   Your cut is bleeding.   Your joint feels weak, or you cannot use your joint.   You have painful lumps on your body.   Your cut is red, puffy (swollen), or painful.   You have a red line on the skin near the cut.   You have yellowish-white fluid (pus) coming from the cut.   You have a fever.   You have a bad smell coming from the cut or bandage.   Your cut breaks open before or after stitches are removed.   You notice something coming out of the cut, such as wood or glass.   You cannot move a finger or toe.  MAKE SURE YOU:   Understand these instructions.   Will watch your condition.   Will get help right away if you are not doing well or get worse.  Document Released: 11/16/2007 Document Revised: 05/19/2011 Document Reviewed: 11/23/2010 [facility] Patient Information 2012 [place], [place]. Patient may use Tylenol or ibuprofen for discomfort.  Every 4-6 hours as needed  You should have the sutures removed in 10 days

## 2011-10-08 NOTE — ED Notes (Signed)
NP at bedside.
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
## 2011-10-08 NOTE — ED Notes (Addendum)
CSI at bedside for photographs, [employer] also at bedside. Pt tearful but cooperative at present, will monitor.
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
## 2011-10-08 NOTE — ED Notes (Signed)
NP at bedside to suture lac.

## 2011-10-09 NOTE — ED Provider Notes (Signed)
Medical screening examination/treatment/procedure(s) were performed by non-physician practitioner and as supervising physician I was immediately available for consultation/collaboration.  Toy Baker, MD 10/09/11 [phone]

## 2012-03-09 ENCOUNTER — Emergency Department (HOSPITAL_COMMUNITY): Payer: Medicaid Other

## 2012-03-09 ENCOUNTER — Emergency Department (HOSPITAL_COMMUNITY)
Admission: EM | Admit: 2012-03-09 | Discharge: 2012-03-10 | Disposition: A | Discharge status: Home / Self Care | Payer: Medicaid Other | Attending: Emergency Medicine | Admitting: Emergency Medicine

## 2012-03-09 ENCOUNTER — Encounter (HOSPITAL_COMMUNITY): Payer: Self-pay | Admitting: Emergency Medicine

## 2012-03-09 DIAGNOSIS — F17200 Nicotine dependence, unspecified, uncomplicated: Secondary | ICD-10-CM | POA: Insufficient documentation

## 2012-03-09 DIAGNOSIS — F419 Anxiety disorder, unspecified: Secondary | ICD-10-CM

## 2012-03-09 DIAGNOSIS — F411 Generalized anxiety disorder: Secondary | ICD-10-CM | POA: Insufficient documentation

## 2012-03-09 DIAGNOSIS — M940 Chondrocostal junction syndrome [Tietze]: Secondary | ICD-10-CM

## 2012-03-09 DIAGNOSIS — E876 Hypokalemia: Secondary | ICD-10-CM | POA: Insufficient documentation

## 2012-03-09 LAB — URINALYSIS, ROUTINE W REFLEX MICROSCOPIC
Glucose, UA: NEGATIVE mg/dL
Hgb urine dipstick: NEGATIVE
Protein, ur: NEGATIVE mg/dL

## 2012-03-09 LAB — POCT I-STAT, CHEM 8
BUN: 13 mg/dL (ref 6–23)
Creatinine, Ser: 1.1 mg/dL (ref 0.50–1.10)
Glucose, Bld: 92 mg/dL (ref 70–99)
Hemoglobin: 17 g/dL — ABNORMAL HIGH (ref 12.0–15.0)
TCO2: 25 mmol/L (ref 0–100)

## 2012-03-09 LAB — POCT PREGNANCY, URINE: Preg Test, Ur: NEGATIVE

## 2012-03-09 MED ORDER — POTASSIUM CHLORIDE 20 MEQ/15ML (10%) PO LIQD
40.0000 meq | Freq: Once | ORAL | Status: AC
  Administered 2012-03-09: 40 meq via ORAL
  Filled 2012-03-09: qty 30

## 2012-03-09 MED ORDER — IBUPROFEN 800 MG PO TABS
800.0000 mg | ORAL_TABLET | Freq: Once | ORAL | Status: AC
  Administered 2012-03-09: 800 mg via ORAL
  Filled 2012-03-09: qty 1

## 2012-03-09 MED ORDER — HYDROXYZINE HCL 25 MG PO TABS: 25.0000 mg | ORAL_TABLET | Freq: Four times a day (QID) | ORAL | Status: DC

## 2012-03-09 NOTE — ED Provider Notes (Signed)
History     CSN: [id number]  Arrival date & time 03/09/12  1729   First MD Initiated Contact with Patient 03/09/12 2138      Chief Complaint  Patient presents with  . Chest Pain    (Consider location/radiation/quality/duration/timing/severity/associated sxs/prior treatment) HPI  26 y.o. female INAD c/o pain to sternum and and SOB worsening over the course of 24 hours. Feels better when taking deep breath, movement exacerbates. Pain is 8/10. Under stress. Denies cough, fever, nausea vomiting, change in bowel or bladder habits. No family history of early cardiac death. No recent travel or supplemental estrogen.   Past Medical History  Diagnosis Date  . History of gonorrhea   . History of chlamydia   . Herpes     frequent outbreaks on valtrex 500  . Abnormal Pap smear   . Depression     while pregnant was on zoloft but now counseling  . Erb's palsy     L arm    Past Surgical History  Procedure Date  . No past surgeries     Family History  Problem Relation Age of Onset  . Depression Mother   . Diabetes Mother   . Hypertension Mother   . Asthma Sister   . Asthma Daughter   . Hypertension Maternal Grandmother     History  Substance Use Topics  . Smoking status: Current Every Day Smoker -- 0.5 packs/day    Types: Cigarettes  . Smokeless tobacco: Never Used  . Alcohol Use: Yes     occ    OB History    Grav Para Term Preterm Abortions TAB SAB Ect Mult Living   2 2 2       2       Review of Systems  Constitutional: Negative for fever.  Respiratory: Positive for shortness of breath. Negative for wheezing.   Cardiovascular: Positive for chest pain.  Gastrointestinal: Negative for nausea, vomiting, abdominal pain and diarrhea.  All other systems reviewed and are negative.    Allergies  Review of patient's allergies indicates no known allergies.  Home Medications   Current Outpatient Rx  Name Route Sig Dispense Refill  . PRESCRIPTION MEDICATION Oral  Take 1-2 tablets by mouth daily. Medication name unknown-fluid pill    . VALACYCLOVIR HCL 500 MG PO TABS Oral Take 500 mg by mouth daily.      BP 131/83  Pulse 80  Temp 97.8 F (36.6 C) (Oral)  Resp 17  SpO2 100%  LMP 01/21/2012  Physical Exam  Nursing note and vitals reviewed. Constitutional: She is oriented to person, place, and time. She appears well-developed and well-nourished. No distress.  HENT:  Head: Normocephalic.  Mouth/Throat: Oropharynx is clear and moist.  Eyes: Conjunctivae normal and EOM are normal. Pupils are equal, round, and reactive to light.  Neck: Normal range of motion. Neck supple. No JVD present.  Cardiovascular: Normal rate, regular rhythm, normal heart sounds and intact distal pulses.  Exam reveals no gallop and no friction rub.   No murmur heard. Pulmonary/Chest: Effort normal and breath sounds normal. No stridor. No respiratory distress. She has no wheezes. She has no rales. She exhibits no tenderness.       Pain to sternum it reproducible by palpation  Abdominal: Soft. Bowel sounds are normal. She exhibits no distension and no mass. There is no tenderness. There is no rebound and no guarding.  Musculoskeletal: Normal range of motion. She exhibits no edema and no tenderness.  Neurological: She is alert and  oriented to person, place, and time.  Skin: Skin is warm.  Psychiatric: She has a normal mood and affect.    ED Course  Procedures (including critical care time)  Labs Reviewed  POCT I-STAT, CHEM 8 - Abnormal; Notable for the following:    Potassium 3.1 (*)     Calcium, Ion 1.10 (*)     Hemoglobin 17.0 (*)     HCT 50.0 (*)     All other components within normal limits  URINALYSIS, ROUTINE W REFLEX MICROSCOPIC  POCT PREGNANCY, URINE  LAB REPORT - SCANNED   No results found.   Date: 03/12/2012  Rate: 101  Rhythm: normal sinus rhythm  QRS Axis: normal  Intervals: normal  ST/T Wave abnormalities: normal  Conduction Disutrbances:none   Narrative Interpretation:   Old EKG Reviewed: none available   1. Costochondritis   2. Hypokalemia   3. Anxiety       MDM  Patient with nonischemic EKG and clear chest x-ray. I-STAT shows hypokalemia at 3.1, which I will replete orally. Vital signs stable and patient stable for discharge at this time with outpatient followup. Return precautions given.        Wynetta Emery, PA-C 03/12/12 [phone]

## 2012-03-09 NOTE — ED Notes (Signed)
Pt is concerned that she may pass out RN offered pt O2 and deep breathing exercise. Pt is comfortable at this time.

## 2012-03-09 NOTE — ED Notes (Signed)
Reassurance given, pt c/o dizziness. Updated on care, explained that we will continue to monitor.

## 2012-03-09 NOTE — ED Notes (Signed)
C/O having shortness of breath 2 days ago. States that this AM it became worse. C/O having a pulling chest pain that is located at the lower end of her sternum.  Pressing this area worsens the pain. Patient states that she has been under a lot of stress lately. Denies nausea and vomiting but C/O having cold sweats.

## 2012-03-09 NOTE — ED Notes (Signed)
Pt to ED via [facility].  Reports R sided chest "pulling sensation" with nausea and sob.  Denies vomiting. Pt states last time she felt like this she was pregnant.  Reports LMP 01/21/12

## 2012-03-09 NOTE — ED Notes (Addendum)
Pt sounds congested and nasal of voice quality, but denies being sick or having allergies.

## 2012-03-11 ENCOUNTER — Encounter (HOSPITAL_COMMUNITY): Payer: Self-pay | Admitting: Emergency Medicine

## 2012-03-11 ENCOUNTER — Emergency Department (HOSPITAL_COMMUNITY)
Admission: EM | Admit: 2012-03-11 | Discharge: 2012-03-11 | Disposition: A | Discharge status: Home / Self Care | Payer: Medicaid Other | Attending: Emergency Medicine | Admitting: Emergency Medicine

## 2012-03-11 DIAGNOSIS — R0789 Other chest pain: Secondary | ICD-10-CM

## 2012-03-11 DIAGNOSIS — F17200 Nicotine dependence, unspecified, uncomplicated: Secondary | ICD-10-CM | POA: Insufficient documentation

## 2012-03-11 DIAGNOSIS — R071 Chest pain on breathing: Secondary | ICD-10-CM | POA: Insufficient documentation

## 2012-03-11 DIAGNOSIS — F419 Anxiety disorder, unspecified: Secondary | ICD-10-CM

## 2012-03-11 DIAGNOSIS — F411 Generalized anxiety disorder: Secondary | ICD-10-CM | POA: Insufficient documentation

## 2012-03-11 MED ORDER — ALPRAZOLAM 0.5 MG PO TABS: 0.5000 mg | ORAL_TABLET | Freq: Three times a day (TID) | ORAL | Status: DC | PRN

## 2012-03-11 MED ORDER — IBUPROFEN 600 MG PO TABS: 600.0000 mg | ORAL_TABLET | Freq: Four times a day (QID) | ORAL | Status: DC | PRN

## 2012-03-11 NOTE — ED Provider Notes (Signed)
History     CSN: [id number]  Arrival date & time 03/11/12  1750   First MD Initiated Contact with Patient 03/11/12 2255      Chief Complaint  Patient presents with  . Chest Pain  . Shortness of Breath  . Panic Attack    (Consider location/radiation/quality/duration/timing/severity/associated sxs/prior treatment) Patient is a 26 y.o. female presenting with chest pain and shortness of breath. The history is provided by the patient.  Chest Pain The chest pain began 3 - 5 days ago. Primary symptoms include shortness of breath. Pertinent negatives for primary symptoms include no abdominal pain, no nausea and no vomiting.  Pertinent negatives for associated symptoms include no numbness and no weakness.    Shortness of Breath  Associated symptoms include chest pain and shortness of breath.   patient has had left-sided chest pain the last 3 days. She was seen in ER 2 days ago and diagnosed with chest wall pain/costochondritis. She states she's had continued pain. She states she was given some medicine for her anxiety at that time too but has not taken it. She states she's worried about it still. No trouble breathing currently. No cough. No fevers. No leg swelling. She smokes about a pack a week. She denies personal cardiac disease.  Past Medical History  Diagnosis Date  . History of gonorrhea   . History of chlamydia   . Herpes     frequent outbreaks on valtrex 500  . Abnormal Pap smear   . Depression     while pregnant was on zoloft but now counseling  . Erb's palsy     L arm    Past Surgical History  Procedure Date  . No past surgeries     Family History  Problem Relation Age of Onset  . Depression Mother   . Diabetes Mother   . Hypertension Mother   . Asthma Sister   . Asthma Daughter   . Hypertension Maternal Grandmother     History  Substance Use Topics  . Smoking status: Current Every Day Smoker -- 0.5 packs/day    Types: Cigarettes  . Smokeless tobacco: Never  Used  . Alcohol Use: Yes     occ    OB History    Grav Para Term Preterm Abortions TAB SAB Ect Mult Living   2 2 2       2       Review of Systems  Constitutional: Negative for activity change and appetite change.  HENT: Negative for neck stiffness.   Eyes: Negative for pain.  Respiratory: Positive for shortness of breath. Negative for chest tightness.   Cardiovascular: Positive for chest pain. Negative for leg swelling.  Gastrointestinal: Negative for nausea, vomiting, abdominal pain and diarrhea.  Genitourinary: Negative for flank pain.  Musculoskeletal: Negative for back pain.  Skin: Negative for rash.  Neurological: Negative for weakness, numbness and headaches.  Psychiatric/Behavioral: Negative for behavioral problems.    Allergies  Review of patient's allergies indicates no known allergies.  Home Medications   Current Outpatient Rx  Name Route Sig Dispense Refill  . HYDROXYZINE HCL 25 MG PO TABS Oral Take 1 tablet (25 mg total) by mouth every 6 (six) hours. 12 tablet 0  . VALACYCLOVIR HCL 500 MG PO TABS Oral Take 500 mg by mouth daily.    [name] ALPRAZOLAM 0.5 MG PO TABS Oral Take 1 tablet (0.5 mg total) by mouth 3 (three) times daily as needed for anxiety. 10 tablet 0  . IBUPROFEN 600 MG  PO TABS Oral Take 1 tablet (600 mg total) by mouth every 6 (six) hours as needed for pain. 20 tablet 0    BP 121/78  Pulse 60  Temp 98.7 F (37.1 C) (Oral)  Resp 18  SpO2 100%  LMP 02/21/2012  Physical Exam  Nursing note and vitals reviewed. Constitutional: She is oriented to person, place, and time. She appears well-developed and well-nourished.  HENT:  Head: Normocephalic and atraumatic.  Eyes: EOM are normal. Pupils are equal, round, and reactive to light.  Neck: Normal range of motion. Neck supple.  Cardiovascular: Normal rate, regular rhythm and normal heart sounds.   No murmur heard. Pulmonary/Chest: Effort normal and breath sounds normal. No respiratory distress. She has  no wheezes. She has no rales. She exhibits tenderness.       Tenderness right parasternal area without rash.  Abdominal: Soft. Bowel sounds are normal. She exhibits no distension. There is no tenderness. There is no rebound and no guarding.  Musculoskeletal: Normal range of motion. She exhibits no edema.  Neurological: She is alert and oriented to person, place, and time. No cranial nerve deficit.  Skin: Skin is warm and dry.  Psychiatric: She has a normal mood and affect. Her speech is normal.       anxious    ED Course  Procedures (including critical care time)  Labs Reviewed - No data to display No results found.   1. Chest wall pain   2. Anxiety     Date: 03/11/2012  Rate: 101  Rhythm: sinus tachycardia  QRS Axis: normal  Intervals: normal  ST/T Wave abnormalities: normal  Conduction Disutrbances:none  Narrative Interpretation:   Old EKG Reviewed: unchanged     MDM  Patient presents with continued right chest wall pain. Tender to palpation. Lungs are clear. EKG is reassuring. Seen 2 days ago for the same. Negative chest x-ray at that time. Doubt pulmonary embolism. Most likely anxiety of the chest wall pain. She'll be given anti-inflammatories and Xanax.        Juliet Rude. Rubin Payor, MD 03/11/12 [phone]

## 2012-03-11 NOTE — ED Notes (Signed)
MD at bedside. 

## 2012-03-11 NOTE — ED Notes (Addendum)
Pt reports panic attack that began two days ago, which she obtained treatment from a [facility]. Pt reports chest pain in mid chest. Pt reports panic attack started 30 minutes ago. Pt reports anxiety, shortness of breath, and multiple stressors.Oxygen saturation 100% on room air, lungs CTA,. NAD.

## 2012-03-11 NOTE — ED Notes (Signed)
Discharge instructions reviewed. Patient verbalizes understanding.

## 2012-03-11 NOTE — ED Notes (Signed)
Pt sts she began feeling chest pain x 3 days ago, was seen at [facility] x 2 days ago and told that she has inflammation of her rib cartridge and that this is normal. Sts she has been having anxiety attacks and shob. Patient has had EKG. Pain 10/10 when she takes deep breaths and sts that palpation makes it worse. Pt sts she feels dehydrated.

## 2012-03-12 NOTE — ED Provider Notes (Signed)
Medical screening examination/treatment/procedure(s) were performed by non-physician practitioner and as supervising physician I was immediately available for consultation/collaboration.  Benney Somm[place] R. Mary-Ann Pennella, MD 03/12/12 2138 

## 2012-05-25 ENCOUNTER — Encounter (HOSPITAL_BASED_OUTPATIENT_CLINIC_OR_DEPARTMENT_OTHER): Payer: Self-pay | Admitting: *Deleted

## 2012-05-25 DIAGNOSIS — F411 Generalized anxiety disorder: Secondary | ICD-10-CM | POA: Insufficient documentation

## 2012-05-25 DIAGNOSIS — F3289 Other specified depressive episodes: Secondary | ICD-10-CM | POA: Insufficient documentation

## 2012-05-25 DIAGNOSIS — B009 Herpesviral infection, unspecified: Secondary | ICD-10-CM | POA: Insufficient documentation

## 2012-05-25 DIAGNOSIS — Z79899 Other long term (current) drug therapy: Secondary | ICD-10-CM | POA: Insufficient documentation

## 2012-05-25 DIAGNOSIS — F17200 Nicotine dependence, unspecified, uncomplicated: Secondary | ICD-10-CM | POA: Insufficient documentation

## 2012-05-25 DIAGNOSIS — F329 Major depressive disorder, single episode, unspecified: Secondary | ICD-10-CM | POA: Insufficient documentation

## 2012-05-25 DIAGNOSIS — Z8619 Personal history of other infectious and parasitic diseases: Secondary | ICD-10-CM | POA: Insufficient documentation

## 2012-05-25 NOTE — ED Notes (Signed)
Pt is from [facility] for marijuana use and sts she has had increased anxiety for the past 2 weeks but she does not have any anxiety meds ordered there.

## 2012-05-26 ENCOUNTER — Emergency Department (HOSPITAL_BASED_OUTPATIENT_CLINIC_OR_DEPARTMENT_OTHER)
Admission: EM | Admit: 2012-05-26 | Discharge: 2012-05-26 | Disposition: A | Discharge status: Home / Self Care | Payer: Medicaid Other | Attending: Emergency Medicine | Admitting: Emergency Medicine

## 2012-05-26 DIAGNOSIS — F419 Anxiety disorder, unspecified: Secondary | ICD-10-CM

## 2012-05-26 MED ORDER — HYDROXYZINE HCL 25 MG PO TABS
50.0000 mg | ORAL_TABLET | Freq: Once | ORAL | Status: AC
  Administered 2012-05-26: 50 mg via ORAL
  Filled 2012-05-26: qty 2

## 2012-05-26 MED ORDER — HYDROXYZINE HCL 50 MG PO TABS: 50.0000 mg | ORAL_TABLET | Freq: Four times a day (QID) | ORAL | Status: DC | PRN

## 2012-05-26 NOTE — ED Provider Notes (Signed)
History     CSN: [id number]  Arrival date & time 05/25/12  2348   First MD Initiated Contact with Patient 05/26/12 0126      Chief Complaint  Patient presents with  . Anxiety    (Consider location/radiation/quality/duration/timing/severity/associated sxs/prior treatment) HPI This is a 26 year old female who who states she was recently sent to [facility] drug rehabilitation center by her probation officer because her urine drug screen showed positive for marijuana. She has about a one-year history of panic attacks. They may happen several times a day. They may last several minutes to over an hour. They're characterized by chest tightness, tachycardia, periorbital and digital paresthesias as well as itching. She was previously on Xanax for these but is not permitted Xanax or other controlled substances in rehabilitation. She requests something to help with her symptoms. She states that she misses her children and the stress of this is causing her panic attacks to get worse over about the past 2 weeks. She denies any suicidal or homicidal ideation. She is not having a panic attack at the present moment.  Past Medical History  Diagnosis Date  . History of gonorrhea   . History of chlamydia   . Herpes     frequent outbreaks on valtrex 500  . Abnormal Pap smear   . Depression     while pregnant was on zoloft but now counseling  . Erb's palsy     L arm    Past Surgical History  Procedure Date  . No past surgeries     Family History  Problem Relation Age of Onset  . Depression Mother   . Diabetes Mother   . Hypertension Mother   . Asthma Sister   . Asthma Daughter   . Hypertension Maternal Grandmother     History  Substance Use Topics  . Smoking status: Current Every Day Smoker -- 0.5 packs/day    Types: Cigarettes  . Smokeless tobacco: Never Used  . Alcohol Use: Yes     Comment: occ    OB History    Grav Para Term Preterm Abortions TAB SAB Ect Mult Living   2 2 2        2       Review of Systems  All other systems reviewed and are negative.    Allergies  Review of patient's allergies indicates no known allergies.  Home Medications   Current Outpatient Rx  Name  Route  Sig  Dispense  Refill  . ALPRAZOLAM 0.5 MG PO TABS   Oral   Take 1 tablet (0.5 mg total) by mouth 3 (three) times daily as needed for anxiety.   10 tablet   0   . IBUPROFEN 600 MG PO TABS   Oral   Take 1 tablet (600 mg total) by mouth every 6 (six) hours as needed for pain.   20 tablet   0   . VALACYCLOVIR HCL 500 MG PO TABS   Oral   Take 500 mg by mouth daily.         [name] HYDROXYZINE HCL 25 MG PO TABS   Oral   Take 1 tablet (25 mg total) by mouth every 6 (six) hours.   12 tablet   0     BP 124/76  Pulse 69  Temp 97.9 F (36.6 C) (Oral)  Resp 18  SpO2 100%  LMP 04/23/2012  Physical Exam General: Well-developed, well-nourished female in no acute distress; appearance consistent with age of record HENT: normocephalic, atraumatic Eyes:  pupils equal round and reactive to light; extraocular muscles intact Neck: supple Heart: regular rate and rhythm Lungs: clear to auscultation bilaterally Abdomen: soft; nondistended Extremities: No deformity; full range of motion Neurologic: Awake, alert; motor function intact in all extremities and symmetric; no facial droop Skin: Warm and dry Psychiatric: Normal mood and affect    ED Course  Procedures (including critical care time)     MDM  We will try patient on Atarax. This is not a controlled substance nor is it associated with abuse. She was advised that it may cause drowsiness.        Hanley Seamen, MD 05/26/12 [phone]

## 2012-05-26 NOTE — ED Notes (Signed)
MD at bedside. 

## 2012-05-30 ENCOUNTER — Emergency Department (HOSPITAL_BASED_OUTPATIENT_CLINIC_OR_DEPARTMENT_OTHER)
Admission: EM | Admit: 2012-05-30 | Discharge: 2012-05-31 | Disposition: A | Discharge status: Home / Self Care | Payer: Medicaid Other | Attending: Emergency Medicine | Admitting: Emergency Medicine

## 2012-05-30 ENCOUNTER — Encounter (HOSPITAL_BASED_OUTPATIENT_CLINIC_OR_DEPARTMENT_OTHER): Payer: Self-pay | Admitting: *Deleted

## 2012-05-30 DIAGNOSIS — N390 Urinary tract infection, site not specified: Secondary | ICD-10-CM | POA: Insufficient documentation

## 2012-05-30 DIAGNOSIS — F17200 Nicotine dependence, unspecified, uncomplicated: Secondary | ICD-10-CM | POA: Insufficient documentation

## 2012-05-30 DIAGNOSIS — F329 Major depressive disorder, single episode, unspecified: Secondary | ICD-10-CM | POA: Insufficient documentation

## 2012-05-30 DIAGNOSIS — F3289 Other specified depressive episodes: Secondary | ICD-10-CM | POA: Insufficient documentation

## 2012-05-30 DIAGNOSIS — Z8669 Personal history of other diseases of the nervous system and sense organs: Secondary | ICD-10-CM | POA: Insufficient documentation

## 2012-05-30 DIAGNOSIS — L98499 Non-pressure chronic ulcer of skin of other sites with unspecified severity: Secondary | ICD-10-CM

## 2012-05-30 DIAGNOSIS — Z79899 Other long term (current) drug therapy: Secondary | ICD-10-CM | POA: Insufficient documentation

## 2012-05-30 DIAGNOSIS — Z8619 Personal history of other infectious and parasitic diseases: Secondary | ICD-10-CM | POA: Insufficient documentation

## 2012-05-30 DIAGNOSIS — Z3202 Encounter for pregnancy test, result negative: Secondary | ICD-10-CM | POA: Insufficient documentation

## 2012-05-30 LAB — URINALYSIS, ROUTINE W REFLEX MICROSCOPIC
Bilirubin Urine: NEGATIVE
Glucose, UA: NEGATIVE mg/dL
Hgb urine dipstick: NEGATIVE
Protein, ur: NEGATIVE mg/dL
Specific Gravity, Urine: 1.023 (ref 1.005–1.030)
Urobilinogen, UA: 1 mg/dL (ref 0.0–1.0)

## 2012-05-30 LAB — URINE MICROSCOPIC-ADD ON

## 2012-05-30 NOTE — ED Provider Notes (Addendum)
History     CSN: [id number]  Arrival date & time 05/30/12  2308   First MD Initiated Contact with Patient 05/30/12 2359      Chief Complaint  Patient presents with  . Genital lesions     (Consider location/radiation/quality/duration/timing/severity/associated sxs/prior treatment) HPI This is a 26 year old female who is in [facility] by court order after failing a drug screen. She is here with a two-day history of sores on her left buttock near the inferior aspect of her vulva. She states she has a history of herpes but does not believe that this is herpes. She states the lesions burn when urine gets on them. She denies frank dysuria. She is also complaining of anxiety and is requesting Xanax; she was seen here several days ago for the same thing and prescribed Atarax and told that she is not permitted to take benzodiazepines while at [facility]. The symptoms are mild.  Past Medical History  Diagnosis Date  . History of gonorrhea   . History of chlamydia   . Herpes     frequent outbreaks on valtrex 500  . Abnormal Pap smear   . Depression     while pregnant was on zoloft but now counseling  . Erb's palsy     L arm    Past Surgical History  Procedure Date  . No past surgeries     Family History  Problem Relation Age of Onset  . Depression Mother   . Diabetes Mother   . Hypertension Mother   . Asthma Sister   . Asthma Daughter   . Hypertension Maternal Grandmother     History  Substance Use Topics  . Smoking status: Current Every Day Smoker -- 0.5 packs/day    Types: Cigarettes  . Smokeless tobacco: Never Used  . Alcohol Use: Yes     Comment: occ    OB History    Grav Para Term Preterm Abortions TAB SAB Ect Mult Living   2 2 2       2       Review of Systems  All other systems reviewed and are negative.    Allergies  Review of patient's allergies indicates no known allergies.  Home Medications   Current Outpatient Rx  Name  Route  Sig  Dispense  Refill   . HYDROXYZINE HCL 25 MG PO TABS   Oral   Take 1 tablet (25 mg total) by mouth every 6 (six) hours.   12 tablet   0   . HYDROXYZINE HCL 50 MG PO TABS   Oral   Take 1 tablet (50 mg total) by mouth every 6 (six) hours as needed for itching or anxiety.   30 tablet   0   . IBUPROFEN 600 MG PO TABS   Oral   Take 1 tablet (600 mg total) by mouth every 6 (six) hours as needed for pain.   20 tablet   0   . VALACYCLOVIR HCL 500 MG PO TABS   Oral   Take 500 mg by mouth daily.         [name] ALPRAZOLAM 0.5 MG PO TABS   Oral   Take 1 tablet (0.5 mg total) by mouth 3 (three) times daily as needed for anxiety.   10 tablet   0     BP 120/68  Pulse 73  Temp 98.1 F (36.7 C) (Oral)  Resp 18  Ht 5\' 6"  (1.676 m)  Wt 215 lb (97.523 kg)  BMI 34.70 kg/m2  SpO2 100%  LMP 05/14/2012  Breastfeeding? No  Physical Exam General: Well-developed, well-nourished female in no acute distress; appearance consistent with age of record HENT: normocephalic, atraumatic Eyes: pupils equal round and reactive to light; extraocular muscles intact Neck: supple Heart: regular rate and rhythm Lungs: clear to auscultation bilaterally Abdomen: soft; nondistended GU: Normal external genitalia; 2 small (0.5cm) ulcerations of skin of left buttock lateral and inferior to the vulva without surrounding edema or erythema Extremities: No deformity; full range of motion Neurologic: Awake, alert; motor function intact in all extremities and symmetric; no facial droop Skin: Warm and dry Psychiatric: Repeatedly asking for anxiolytics    ED Course  Procedures (including critical care time)     MDM   Nursing notes and vitals signs, including pulse oximetry, reviewed.  Summary of this visit's results, reviewed by myself:  Labs:  Results for orders placed during the hospital encounter of 05/30/12 (from the past 24 hour(s))  URINALYSIS, ROUTINE W REFLEX MICROSCOPIC     Status: Abnormal   Collection Time    05/30/12 11:18 PM      Component Value Range   Color, Urine YELLOW  YELLOW   APPearance CLEAR  CLEAR   Specific Gravity, Urine 1.023  1.005 - 1.030   pH 6.0  5.0 - 8.0   Glucose, UA NEGATIVE  NEGATIVE mg/dL   Hgb urine dipstick NEGATIVE  NEGATIVE   Bilirubin Urine NEGATIVE  NEGATIVE   Ketones, ur NEGATIVE  NEGATIVE mg/dL   Protein, ur NEGATIVE  NEGATIVE mg/dL   Urobilinogen, UA 1.0  0.0 - 1.0 mg/dL   Nitrite NEGATIVE  NEGATIVE   Leukocytes, UA MODERATE (*) NEGATIVE  PREGNANCY, URINE     Status: Normal   Collection Time   05/30/12 11:18 PM      Component Value Range   Preg Test, Ur NEGATIVE  NEGATIVE  URINE MICROSCOPIC-ADD ON     Status: Abnormal   Collection Time   05/30/12 11:18 PM      Component Value Range   Squamous Epithelial / LPF RARE  RARE   WBC, UA 7-10  <3 WBC/hpf   RBC / HPF 0-2  <3 RBC/hpf   Bacteria, UA FEW (*) RARE   Urine-Other MUCOUS PRESENT              Hanley Seamen, MD 05/31/12 0013  Hanley Seamen, MD 05/31/12 1610

## 2012-05-30 NOTE — ED Notes (Signed)
Pt also c/o anxiety attack and needed to be put back on her Xanax which they d/c'd while she has been at [facility]. Pt is in NAD in triage.

## 2012-05-30 NOTE — ED Notes (Signed)
Pt here from [facility] c/o sores to her vaginal area for few days. Pt has hx of herpes but states this is not a herpes outbreak.

## 2012-05-31 MED ORDER — LIDOCAINE HCL 2 % EX GEL
CUTANEOUS | Status: DC | PRN
  Administered 2012-05-31: 20 via TOPICAL
  Filled 2012-05-31: qty 20

## 2012-05-31 MED ORDER — NITROFURANTOIN MONOHYD MACRO 100 MG PO CAPS
100.0000 mg | ORAL_CAPSULE | Freq: Once | ORAL | Status: AC
  Administered 2012-05-31: 100 mg via ORAL
  Filled 2012-05-31: qty 1

## 2012-05-31 MED ORDER — NITROFURANTOIN MONOHYD MACRO 100 MG PO CAPS: 100.0000 mg | ORAL_CAPSULE | Freq: Two times a day (BID) | ORAL | Status: DC

## 2012-05-31 MED ORDER — MUPIROCIN CALCIUM 2 % EX CREA
TOPICAL_CREAM | Freq: Once | CUTANEOUS | Status: AC
  Administered 2012-05-31: via TOPICAL
  Filled 2012-05-31: qty 15

## 2012-05-31 NOTE — ED Notes (Signed)
MD at bedside. 

## 2012-06-01 LAB — URINE CULTURE

## 2013-11-07 ENCOUNTER — Encounter (HOSPITAL_COMMUNITY): Payer: Self-pay | Admitting: Emergency Medicine

## 2013-11-07 ENCOUNTER — Emergency Department (INDEPENDENT_AMBULATORY_CARE_PROVIDER_SITE_OTHER)
Admission: EM | Admit: 2013-11-07 | Discharge: 2013-11-07 | Disposition: A | Discharge status: Home / Self Care | Payer: Medicaid Other | Source: Home / Self Care | Attending: Family Medicine | Admitting: Family Medicine

## 2013-11-07 DIAGNOSIS — L0291 Cutaneous abscess, unspecified: Secondary | ICD-10-CM

## 2013-11-07 DIAGNOSIS — L0390 Cellulitis, unspecified: Secondary | ICD-10-CM

## 2013-11-07 LAB — POCT PREGNANCY, URINE: PREG TEST UR: NEGATIVE

## 2013-11-07 MED ORDER — TRAMADOL HCL 50 MG PO TABS: 50.0000 mg | ORAL_TABLET | Freq: Four times a day (QID) | ORAL | Status: DC | PRN

## 2013-11-07 MED ORDER — SODIUM BICARBONATE 4 % IV SOLN
INTRAVENOUS | Status: AC
  Filled 2013-11-07: qty 5

## 2013-11-07 MED ORDER — SULFAMETHOXAZOLE-TRIMETHOPRIM 800-160 MG PO TABS: 2.0000 | ORAL_TABLET | Freq: Two times a day (BID) | ORAL | Status: DC

## 2013-11-07 NOTE — Discharge Instructions (Signed)
Thank you for coming in today. Return in 3 days to remove the packing.  Use contraception while taking the antibiotic.   Abscess Care After An abscess (also called a boil or furuncle) is an infected area that contains a collection of pus. Signs and symptoms of an abscess include pain, tenderness, redness, or hardness, or you may feel a moveable soft area under your skin. An abscess can occur anywhere in the body. The infection may spread to surrounding tissues causing cellulitis. A cut (incision) by the surgeon was made over your abscess and the pus was drained out. Gauze may have been packed into the space to provide a drain that will allow the cavity to heal from the inside outwards. The boil may be painful for 5 to 7 days. Most people with a boil do not have high fevers. Your abscess, if seen early, may not have localized, and may not have been lanced. If not, another appointment may be required for this if it does not get better on its own or with medications. HOME CARE INSTRUCTIONS   Only take over-the-counter or prescription medicines for pain, discomfort, or fever as directed by your caregiver.  When you bathe, soak and then remove gauze or iodoform packs at least daily or as directed by your caregiver. You may then wash the wound gently with mild soapy water. Repack with gauze or do as your caregiver directs. SEEK IMMEDIATE MEDICAL CARE IF:   You develop increased pain, swelling, redness, drainage, or bleeding in the wound site.  You develop signs of generalized infection including muscle aches, chills, fever, or a general ill feeling.  An oral temperature above 102 F (38.9 C) develops, not controlled by medication. See your caregiver for a recheck if you develop any of the symptoms described above. If medications (antibiotics) were prescribed, take them as directed. Document Released: 12/16/2004 Document Revised: 08/22/2011 Document Reviewed: 08/13/2007 [facility] Patient Information  2014 [place], [place].

## 2013-11-07 NOTE — ED Notes (Signed)
Pt c/o multiple abscess on left axilla onset yest Sx include pain 10/10 and swelling Denies f/v/n/d, drainage Alert w/no signs of acute distress.

## 2013-11-07 NOTE — ED Provider Notes (Signed)
Diamond Burton is a 28 y.o. female who presents to Urgent Care today for abscess left axilla present for one day. Patient has had recurrent abscesses that drained spontaneously in the same area. No fevers or chills nausea vomiting or diarrhea. Pain is severe. No treatments tried yet.   Past Medical History  Diagnosis Date  . History of gonorrhea   . History of chlamydia   . Herpes     frequent outbreaks on valtrex 500  . Abnormal Pap smear   . Depression     while pregnant was on zoloft but now counseling  . Erb's palsy     L arm   History  Substance Use Topics  . Smoking status: Current Every Day Smoker -- 0.50 packs/day    Types: Cigarettes  . Smokeless tobacco: Never Used  . Alcohol Use: Yes     Comment: occ   ROS as above Medications: No current facility-administered medications for this encounter.   Current Outpatient Prescriptions  Medication Sig Dispense Refill  . ALPRAZolam (XANAX) 0.5 MG tablet Take 1 tablet (0.5 mg total) by mouth 3 (three) times daily as needed for anxiety.  10 tablet  0  . hydrOXYzine (ATARAX/VISTARIL) 25 MG tablet Take 1 tablet (25 mg total) by mouth every 6 (six) hours.  12 tablet  0  . hydrOXYzine (ATARAX/VISTARIL) 50 MG tablet Take 1 tablet (50 mg total) by mouth every 6 (six) hours as needed for itching or anxiety.  30 tablet  0  . ibuprofen (ADVIL,MOTRIN) 600 MG tablet Take 1 tablet (600 mg total) by mouth every 6 (six) hours as needed for pain.  20 tablet  0  . nitrofurantoin, macrocrystal-monohydrate, (MACROBID) 100 MG capsule Take 1 capsule (100 mg total) by mouth 2 (two) times daily.  14 capsule  0  . sulfamethoxazole-trimethoprim (SEPTRA DS) 800-160 MG per tablet Take 2 tablets by mouth 2 (two) times daily.  28 tablet  0  . traMADol (ULTRAM) 50 MG tablet Take 1 tablet (50 mg total) by mouth every 6 (six) hours as needed.  15 tablet  0  . valACYclovir (VALTREX) 500 MG tablet Take 500 mg by mouth daily.        Exam:  BP 123/79  Pulse 69   Temp(Src) 97.7 F (36.5 C) (Oral)  Resp 20  SpO2 100% Gen: Well NAD Skin: Left axilla with indurated erythematous area of fluctuance Center. Tender to touch.  Abscess incision and drainage:  Consent obtained and timeout performed. Skin cleansed with alcohol. Cold spray and lidocaine with epinephrine and bicarbonate buffer injected into the area of fluctuance. 5 mL used.  The area of fluctuance was sharply incised and a moderate amount of pus was drained and cultured. More dissection was used to break up loculations. The initial incision was enlarged and the wound was packed with about 3 inches of 1/2 inch packing material. A dressing was applied. Patient tolerated the procedure well  Results for orders placed during the hospital encounter of 11/07/13 (from the past 24 hour(s))  POCT PREGNANCY, URINE     Status: None   Collection Time    11/07/13  4:22 PM      Result Value Ref Range   Preg Test, Ur NEGATIVE  NEGATIVE   No results found.  Assessment and Plan: 28 y.o. female with abscess incised and drained. Culture pending. EmpiricTreatment with Bactrim. Tramadol for pain control. Return in 3 days for wound check and packing removal.  Discussed warning signs or symptoms. Please see  discharge instructions. Patient expresses understanding.    Rodolph Bong, MD 11/07/13 [phone]

## 2013-11-10 LAB — CULTURE, ROUTINE-ABSCESS

## 2014-04-14 ENCOUNTER — Encounter (HOSPITAL_COMMUNITY): Payer: Self-pay | Admitting: Emergency Medicine

## 2014-06-03 ENCOUNTER — Encounter (HOSPITAL_COMMUNITY): Payer: Self-pay | Admitting: *Deleted

## 2014-06-03 ENCOUNTER — Inpatient Hospital Stay (HOSPITAL_COMMUNITY): Payer: Medicaid Other

## 2014-06-03 ENCOUNTER — Inpatient Hospital Stay (HOSPITAL_COMMUNITY)
Admission: AD | Admit: 2014-06-03 | Discharge: 2014-06-03 | Disposition: A | Discharge status: Home / Self Care | Payer: Self-pay | Source: Ambulatory Visit | Attending: Family Medicine | Admitting: Family Medicine

## 2014-06-03 DIAGNOSIS — N8320 Unspecified ovarian cysts: Secondary | ICD-10-CM | POA: Insufficient documentation

## 2014-06-03 DIAGNOSIS — N83202 Unspecified ovarian cyst, left side: Secondary | ICD-10-CM

## 2014-06-03 DIAGNOSIS — R102 Pelvic and perineal pain: Secondary | ICD-10-CM | POA: Insufficient documentation

## 2014-06-03 LAB — WET PREP, GENITAL
Clue Cells Wet Prep HPF POC: NONE SEEN
Trich, Wet Prep: NONE SEEN
YEAST WET PREP: NONE SEEN

## 2014-06-03 LAB — URINE MICROSCOPIC-ADD ON

## 2014-06-03 LAB — CBC
HCT: 38.6 % (ref 36.0–46.0)
Hemoglobin: 13.1 g/dL (ref 12.0–15.0)
MCH: 30 pg (ref 26.0–34.0)
MCHC: 33.9 g/dL (ref 30.0–36.0)
MCV: 88.5 fL (ref 78.0–100.0)
PLATELETS: 267 10*3/uL (ref 150–400)
RBC: 4.36 MIL/uL (ref 3.87–5.11)
RDW: 12.8 % (ref 11.5–15.5)
WBC: 6 10*3/uL (ref 4.0–10.5)

## 2014-06-03 LAB — URINALYSIS, ROUTINE W REFLEX MICROSCOPIC
Bilirubin Urine: NEGATIVE
Glucose, UA: NEGATIVE mg/dL
Ketones, ur: NEGATIVE mg/dL
LEUKOCYTES UA: NEGATIVE
NITRITE: NEGATIVE
PH: 6.5 (ref 5.0–8.0)
Protein, ur: NEGATIVE mg/dL
SPECIFIC GRAVITY, URINE: 1.02 (ref 1.005–1.030)
Urobilinogen, UA: 0.2 mg/dL (ref 0.0–1.0)

## 2014-06-03 LAB — HCG, QUANTITATIVE, PREGNANCY

## 2014-06-03 LAB — POCT PREGNANCY, URINE: Preg Test, Ur: NEGATIVE

## 2014-06-03 MED ORDER — LACTATED RINGERS IV BOLUS (SEPSIS)
1000.0000 mL | Freq: Once | INTRAVENOUS | Status: AC
  Administered 2014-06-03: 1000 mL via INTRAVENOUS

## 2014-06-03 MED ORDER — OXYCODONE-ACETAMINOPHEN 5-325 MG PO TABS: 1.0000 | ORAL_TABLET | ORAL | Status: DC | PRN

## 2014-06-03 MED ORDER — KETOROLAC TROMETHAMINE 30 MG/ML IJ SOLN
30.0000 mg | INTRAMUSCULAR | Status: AC
  Administered 2014-06-03: 30 mg via INTRAVENOUS
  Filled 2014-06-03: qty 1

## 2014-06-03 MED ORDER — OXYCODONE-ACETAMINOPHEN 5-325 MG PO TABS
2.0000 | ORAL_TABLET | ORAL | Status: AC
  Administered 2014-06-03: 2 via ORAL
  Filled 2014-06-03: qty 2

## 2014-06-03 NOTE — MAU Note (Signed)
Pt came in by EMS with C/O severe abd pain that feels like contractions.  Pt states she started her period yesterday, pain has become increasingly severe.

## 2014-06-03 NOTE — Discharge Instructions (Signed)
Ovarian Cyst  An ovarian cyst is a fluid-filled sac that forms on an ovary. The ovaries are small organs that produce eggs in women. Various types of cysts can form on the ovaries. Most are not cancerous. Many do not cause problems, and they often go away on their own. Some may cause symptoms and require treatment. Common types of ovarian cysts include:   Functional cysts--These cysts may occur every month during the menstrual cycle. This is normal. The cysts usually go away with the next menstrual cycle if the woman does not get pregnant. Usually, there are no symptoms with a functional cyst.   Endometrioma cysts--These cysts form from the tissue that lines the uterus. They are also called "chocolate cysts" because they become filled with blood that turns brown. This type of cyst can cause pain in the lower abdomen during intercourse and with your menstrual period.   Cystadenoma cysts--This type develops from the cells on the outside of the ovary. These cysts can get very big and cause lower abdomen pain and pain with intercourse. This type of cyst can twist on itself, cut off its blood supply, and cause severe pain. It can also easily rupture and cause a lot of pain.   Dermoid cysts--This type of cyst is sometimes found in both ovaries. These cysts may contain different kinds of body tissue, such as skin, teeth, hair, or cartilage. They usually do not cause symptoms unless they get very big.   Theca lutein cysts--These cysts occur when too much of a certain hormone (human chorionic gonadotropin) is produced and overstimulates the ovaries to produce an egg. This is most common after procedures used to assist with the conception of a baby (in vitro fertilization).  CAUSES    Fertility drugs can cause a condition in which multiple large cysts are formed on the ovaries. This is called ovarian hyperstimulation syndrome.   A condition called polycystic ovary syndrome can cause hormonal imbalances that can lead to  nonfunctional ovarian cysts.  SIGNS AND SYMPTOMS   Many ovarian cysts do not cause symptoms. If symptoms are present, they may include:   Pelvic pain or pressure.   Pain in the lower abdomen.   Pain during sexual intercourse.   Increasing girth (swelling) of the abdomen.   Abnormal menstrual periods.   Increasing pain with menstrual periods.   Stopping having menstrual periods without being pregnant.  DIAGNOSIS   These cysts are commonly found during a routine or annual pelvic exam. Tests may be ordered to find out more about the cyst. These tests may include:   Ultrasound.   X-ray of the pelvis.   CT scan.   MRI.   Blood tests.  TREATMENT   Many ovarian cysts go away on their own without treatment. Your health care provider may want to check your cyst regularly for 2-3 months to see if it changes. For women in menopause, it is particularly important to monitor a cyst closely because of the higher rate of ovarian cancer in menopausal women. When treatment is needed, it may include any of the following:   A procedure to drain the cyst (aspiration). This may be done using a long needle and ultrasound. It can also be done through a laparoscopic procedure. This involves using a thin, lighted tube with a tiny camera on the end (laparoscope) inserted through a small incision.   Surgery to remove the whole cyst. This may be done using laparoscopic surgery or an open surgery involving a larger incision   in the lower abdomen.   Hormone treatment or birth control pills. These methods are sometimes used to help dissolve a cyst.  HOME CARE INSTRUCTIONS    Only take over-the-counter or prescription medicines as directed by your health care provider.   Follow up with your health care provider as directed.   Get regular pelvic exams and Pap tests.  SEEK MEDICAL CARE IF:    Your periods are late, irregular, or painful, or they stop.   Your pelvic pain or abdominal pain does not go away.   Your abdomen becomes  larger or swollen.   You have pressure on your bladder or trouble emptying your bladder completely.   You have pain during sexual intercourse.   You have feelings of fullness, pressure, or discomfort in your stomach.   You lose weight for no apparent reason.   You feel generally ill.   You become constipated.   You lose your appetite.   You develop acne.   You have an increase in body and facial hair.   You are gaining weight, without changing your exercise and eating habits.   You think you are pregnant.  SEEK IMMEDIATE MEDICAL CARE IF:    You have increasing abdominal pain.   You feel sick to your stomach (nauseous), and you throw up (vomit).   You develop a fever that comes on suddenly.   You have abdominal pain during a bowel movement.   Your menstrual periods become heavier than usual.  MAKE SURE YOU:   Understand these instructions.   Will watch your condition.   Will get help right away if you are not doing well or get worse.  Document Released: 05/30/2005 Document Revised: 06/04/2013 Document Reviewed: 02/04/2013  Exit[employer] Patient Information 2015 [employer]. This information is not intended to replace advice given to you by your health care provider. Make sure you discuss any questions you have with your health care provider.      Pelvic Pain  Female pelvic pain can be caused by many different things and start from a variety of places. Pelvic pain refers to pain that is located in the lower half of the abdomen and between your hips. The pain may occur over a short period of time (acute) or may be reoccurring (chronic). The cause of pelvic pain may be related to disorders affecting the female reproductive organs (gynecologic), but it may also be related to the bladder, kidney stones, an intestinal complication, or muscle or skeletal problems. Getting help right away for pelvic pain is important, especially if there has been severe, sharp, or a sudden onset of unusual pain. It is also  important to get help right away because some types of pelvic pain can be life threatening.   CAUSES   Below are only some of the causes of pelvic pain. The causes of pelvic pain can be in one of several categories.    Gynecologic.   Pelvic inflammatory disease.   Sexually transmitted infection.   Ovarian cyst or a twisted ovarian ligament (ovarian torsion).   Uterine lining that grows outside the uterus (endometriosis).   Fibroids, cysts, or tumors.   Ovulation.   Pregnancy.   Pregnancy that occurs outside the uterus (ectopic pregnancy).   Miscarriage.   Labor.   Abruption of the placenta or ruptured uterus.   Infection.   Uterine infection (endometritis).   Bladder infection.   Diverticulitis.   Miscarriage related to a uterine infection (septic abortion).   Bladder.   Inflammation   of the bladder (cystitis).   Kidney stone(s).   Gastrointestinal.   Constipation.   Diverticulitis.   Neurologic.   Trauma.   Feeling pelvic pain because of mental or emotional causes (psychosomatic).   Cancers of the bowel or pelvis.  EVALUATION   Your caregiver will want to take a careful history of your concerns. This includes recent changes in your health, a careful gynecologic history of your periods (menses), and a sexual history. Obtaining your family history and medical history is also important. Your caregiver may suggest a pelvic exam. A pelvic exam will help identify the location and severity of the pain. It also helps in the evaluation of which organ system may be involved. In order to identify the cause of the pelvic pain and be properly treated, your caregiver may order tests. These tests may include:    A pregnancy test.   Pelvic ultrasonography.   An X-ray exam of the abdomen.   A urinalysis or evaluation of vaginal discharge.   Blood tests.  HOME CARE INSTRUCTIONS    Only take over-the-counter or prescription medicines for pain, discomfort, or fever as directed by your caregiver.     Rest as directed by your caregiver.    Eat a balanced diet.    Drink enough fluids to make your urine clear or pale yellow, or as directed.    Avoid sexual intercourse if it causes pain.    Apply warm or cold compresses to the lower abdomen depending on which one helps the pain.    Avoid stressful situations.    Keep a journal of your pelvic pain. Write down when it started, where the pain is located, and if there are things that seem to be associated with the pain, such as food or your menstrual cycle.   Follow up with your caregiver as directed.   SEEK MEDICAL CARE IF:   Your medicine does not help your pain.   You have abnormal vaginal discharge.  SEEK IMMEDIATE MEDICAL CARE IF:    You have heavy bleeding from the vagina.    Your pelvic pain increases.    You feel light-headed or faint.    You have chills.    You have pain with urination or blood in your urine.    You have uncontrolled diarrhea or vomiting.    You have a fever or persistent symptoms for more than 3 days.   You have a fever and your symptoms suddenly get worse.    You are being physically or sexually abused.   MAKE SURE YOU:   Understand these instructions.   Will watch your condition.   Will get help if you are not doing well or get worse.  Document Released: 04/26/2004 Document Revised: 10/14/2013 Document Reviewed: 09/19/2011  [employer] Patient Information 2015 [employer]. This information is not intended to replace advice given to you by your health care provider. Make sure you discuss any questions you have with your health care provider.

## 2014-06-03 NOTE — MAU Provider Note (Signed)
History     CSN: [id number]  Arrival date and time: 06/03/14 1014   First Provider Initiated Contact with Patient 06/03/14 1051      Chief Complaint  Patient presents with  . Abdominal Pain   HPI Diamond Burton 28 y.o. [id number] nonpregnant female complains of terrible cramps.  Her period started yesterday with the cramps.  Bleeding was heavier yesterday and lighter tdoay.  She was due to have her menses 05/29/14.  She has not used any contraception/hormones.  Cramping is worse today 12/10.  She has nausea, lightheadedness, sweating, chills.  She has not eaten today.  She denies fever, diarrhea, constipation.   OB History    Gravida Para Term Preterm AB TAB SAB Ectopic Multiple Living   2 2 2       2       Past Medical History  Diagnosis Date  . History of gonorrhea   . History of chlamydia   . Herpes     frequent outbreaks on valtrex 500  . Abnormal Pap smear   . Depression     while pregnant was on zoloft but now counseling  . Erb's palsy     L arm    Past Surgical History  Procedure Laterality Date  . No past surgeries      Family History  Problem Relation Age of Onset  . Depression Mother   . Diabetes Mother   . Hypertension Mother   . Asthma Sister   . Asthma Daughter   . Hypertension Maternal Grandmother     History  Substance Use Topics  . Smoking status: Current Every Day Smoker -- 0.50 packs/day    Types: Cigarettes  . Smokeless tobacco: Never Used  . Alcohol Use: No     Comment: occ    Allergies: No Known Allergies  Prescriptions prior to admission  Medication Sig Dispense Refill Last Dose  . ibuprofen (ADVIL,MOTRIN) 600 MG tablet Take 1 tablet (600 mg total) by mouth every 6 (six) hours as needed for pain. 20 tablet 0 06/02/2014 at Unknown time  . ALPRAZolam (XANAX) 0.5 MG tablet Take 1 tablet (0.5 mg total) by mouth 3 (three) times daily as needed for anxiety. (Patient not taking: Reported on 06/03/2014) 10 tablet 0 Unknown at Unknown time   . hydrOXYzine (ATARAX/VISTARIL) 25 MG tablet Take 1 tablet (25 mg total) by mouth every 6 (six) hours. (Patient not taking: Reported on 06/03/2014) 12 tablet 0 Unknown at Unknown time  . hydrOXYzine (ATARAX/VISTARIL) 50 MG tablet Take 1 tablet (50 mg total) by mouth every 6 (six) hours as needed for itching or anxiety. (Patient not taking: Reported on 06/03/2014) 30 tablet 0 Unknown at Unknown time  . nitrofurantoin, macrocrystal-monohydrate, (MACROBID) 100 MG capsule Take 1 capsule (100 mg total) by mouth 2 (two) times daily. (Patient not taking: Reported on 06/03/2014) 14 capsule 0 Unknown at Unknown time  . sulfamethoxazole-trimethoprim (SEPTRA DS) 800-160 MG per tablet Take 2 tablets by mouth 2 (two) times daily. (Patient not taking: Reported on 06/03/2014) 28 tablet 0   . traMADol (ULTRAM) 50 MG tablet Take 1 tablet (50 mg total) by mouth every 6 (six) hours as needed. (Patient not taking: Reported on 06/03/2014) 15 tablet 0     ROS Pertinent ROS in HPI Physical Exam   Blood pressure 127/66, pulse 72, temperature 97.6 F (36.4 C), temperature source Oral, resp. rate 20, last menstrual period 06/02/2014.  Physical Exam  Constitutional: She is oriented to person, place, and time. She appears  well-developed and well-nourished. No distress.  HENT:  Head: Normocephalic and atraumatic.  Eyes: EOM are normal.  Neck: Normal range of motion.  Cardiovascular: Normal rate and regular rhythm.   Respiratory: Effort normal and breath sounds normal. No respiratory distress.  GI: Soft. Bowel sounds are normal. She exhibits no distension. There is no tenderness. There is no rebound.  Genitourinary:  Small amt of dark red blood present in vagina.  Musculoskeletal: Normal range of motion.  Neurological: She is alert and oriented to person, place, and time.  Skin: Skin is warm and dry.  Psychiatric: She has a normal mood and affect.    MAU Course  Procedures  MDM Quant HCG ordered as pt is not  convinced by urine PRT.  Quant is negative.  IM Toradol given.  Pt reports it only brings pain down to 9/10.  Percocet x 2 given and pelvic U/S. Ovarian cyst on U/S.  Pt reports pain level 0/10.  Will discharge with symptom control.  Assessment and Plan  A: Pelvic pain.Ovarian Cyst  P: Discharge to home Percocet PRN May use OTC Ibuprofen for symptom control See Dr. Gaynell FaceMarshall asap for further management Patient may return to MAU as needed or if her condition were to change or worsen   Bertram Denvereague Clark, Karen E 06/03/2014, 10:52 AM

## 2014-06-04 LAB — GC/CHLAMYDIA PROBE AMP
CT Probe RNA: NEGATIVE
GC Probe RNA: NEGATIVE

## 2014-09-06 ENCOUNTER — Encounter (HOSPITAL_COMMUNITY): Payer: Self-pay | Admitting: Emergency Medicine

## 2014-09-06 ENCOUNTER — Emergency Department (HOSPITAL_COMMUNITY): Payer: Medicaid Other

## 2014-09-06 ENCOUNTER — Emergency Department (HOSPITAL_COMMUNITY)
Admission: EM | Admit: 2014-09-06 | Discharge: 2014-09-06 | Disposition: A | Discharge status: Home / Self Care | Payer: Self-pay | Attending: Emergency Medicine | Admitting: Emergency Medicine

## 2014-09-06 DIAGNOSIS — Z3202 Encounter for pregnancy test, result negative: Secondary | ICD-10-CM | POA: Insufficient documentation

## 2014-09-06 DIAGNOSIS — R1013 Epigastric pain: Secondary | ICD-10-CM | POA: Insufficient documentation

## 2014-09-06 DIAGNOSIS — R1031 Right lower quadrant pain: Secondary | ICD-10-CM | POA: Insufficient documentation

## 2014-09-06 DIAGNOSIS — Z8619 Personal history of other infectious and parasitic diseases: Secondary | ICD-10-CM | POA: Insufficient documentation

## 2014-09-06 DIAGNOSIS — Z79899 Other long term (current) drug therapy: Secondary | ICD-10-CM | POA: Insufficient documentation

## 2014-09-06 DIAGNOSIS — Z8659 Personal history of other mental and behavioral disorders: Secondary | ICD-10-CM | POA: Insufficient documentation

## 2014-09-06 DIAGNOSIS — R1011 Right upper quadrant pain: Secondary | ICD-10-CM | POA: Insufficient documentation

## 2014-09-06 DIAGNOSIS — R109 Unspecified abdominal pain: Secondary | ICD-10-CM

## 2014-09-06 DIAGNOSIS — Z720 Tobacco use: Secondary | ICD-10-CM | POA: Insufficient documentation

## 2014-09-06 LAB — URINALYSIS, ROUTINE W REFLEX MICROSCOPIC
Bilirubin Urine: NEGATIVE
Glucose, UA: NEGATIVE mg/dL
Hgb urine dipstick: NEGATIVE
Ketones, ur: NEGATIVE mg/dL
Leukocytes, UA: NEGATIVE
Nitrite: NEGATIVE
Protein, ur: NEGATIVE mg/dL
Specific Gravity, Urine: 1.024 (ref 1.005–1.030)
Urobilinogen, UA: 0.2 mg/dL (ref 0.0–1.0)
pH: 5 (ref 5.0–8.0)

## 2014-09-06 LAB — CBC WITH DIFFERENTIAL/PLATELET
Basophils Absolute: 0 10*3/uL (ref 0.0–0.1)
Basophils Relative: 0 % (ref 0–1)
Eosinophils Absolute: 0.1 10*3/uL (ref 0.0–0.7)
Eosinophils Relative: 1 % (ref 0–5)
HCT: 41.7 % (ref 36.0–46.0)
Hemoglobin: 13.8 g/dL (ref 12.0–15.0)
Lymphocytes Relative: 18 % (ref 12–46)
Lymphs Abs: 1.7 10*3/uL (ref 0.7–4.0)
MCH: 30 pg (ref 26.0–34.0)
MCHC: 33.1 g/dL (ref 30.0–36.0)
MCV: 90.7 fL (ref 78.0–100.0)
Monocytes Absolute: 0.5 10*3/uL (ref 0.1–1.0)
Monocytes Relative: 6 % (ref 3–12)
Neutro Abs: 6.8 10*3/uL (ref 1.7–7.7)
Neutrophils Relative %: 75 % (ref 43–77)
Platelets: 263 10*3/uL (ref 150–400)
RBC: 4.6 MIL/uL (ref 3.87–5.11)
RDW: 13.2 % (ref 11.5–15.5)
WBC: 9.1 10*3/uL (ref 4.0–10.5)

## 2014-09-06 LAB — COMPREHENSIVE METABOLIC PANEL
ALT: 15 U/L (ref 0–35)
AST: 16 U/L (ref 0–37)
Albumin: 3.9 g/dL (ref 3.5–5.2)
Alkaline Phosphatase: 83 U/L (ref 39–117)
Anion gap: 7 (ref 5–15)
BUN: 12 mg/dL (ref 6–23)
CO2: 24 mmol/L (ref 19–32)
Calcium: 8.5 mg/dL (ref 8.4–10.5)
Chloride: 105 mmol/L (ref 96–112)
Creatinine, Ser: 0.73 mg/dL (ref 0.50–1.10)
GFR calc Af Amer: >90 mL/min (ref >90)
GFR calc non Af Amer: >90 mL/min (ref >90)
Glucose, Bld: 99 mg/dL (ref 70–99)
Potassium: 4 mmol/L (ref 3.5–5.1)
Sodium: 136 mmol/L (ref 135–145)
Total Bilirubin: 0.2 mg/dL — ABNORMAL LOW (ref 0.3–1.2)
Total Protein: 7.4 g/dL (ref 6.0–8.3)

## 2014-09-06 LAB — POC URINE PREG, ED: Preg Test, Ur: NEGATIVE

## 2014-09-06 MED ORDER — SODIUM CHLORIDE 0.9 % IV BOLUS (SEPSIS)
1000.0000 mL | Freq: Once | INTRAVENOUS | Status: AC
  Administered 2014-09-06: 1000 mL via INTRAVENOUS

## 2014-09-06 MED ORDER — HYDROCODONE-ACETAMINOPHEN 5-325 MG PO TABS: 1.0000 | ORAL_TABLET | Freq: Four times a day (QID) | ORAL | Status: DC | PRN

## 2014-09-06 MED ORDER — PROMETHAZINE HCL 25 MG PO TABS: 25.0000 mg | ORAL_TABLET | Freq: Three times a day (TID) | ORAL | Status: DC | PRN

## 2014-09-06 MED ORDER — ONDANSETRON HCL 4 MG/2ML IJ SOLN
4.0000 mg | Freq: Once | INTRAMUSCULAR | Status: AC
  Administered 2014-09-06: 4 mg via INTRAVENOUS
  Filled 2014-09-06: qty 2

## 2014-09-06 MED ORDER — MORPHINE SULFATE 4 MG/ML IJ SOLN
4.0000 mg | Freq: Once | INTRAMUSCULAR | Status: AC
  Administered 2014-09-06: 4 mg via INTRAVENOUS
  Filled 2014-09-06: qty 1

## 2014-09-06 NOTE — ED Notes (Signed)
Patient here with complaints of nausea, vomiting that started yesterday. 4mg  Zofran given in route with some relief. CBG 92.

## 2014-09-06 NOTE — ED Notes (Signed)
Bed: FA21WA03 Expected date: 09/06/14 Expected time: 1:05 PM Means of arrival: Ambulance Comments: abd pain

## 2014-09-06 NOTE — ED Provider Notes (Signed)
CSN: [id number]     Arrival date & time 09/06/14  1315 History   First MD Initiated Contact with Patient 09/06/14 1331     Chief Complaint  Patient presents with  . Nausea  . Emesis     (Consider location/radiation/quality/duration/timing/severity/associated sxs/prior Treatment) HPI   The patient is a 29 y/o female who presents to the emergency department with nausea, vomiting,  abdominal and back pain. The nausea was present upon waking this morning, she has vomited several times but not since presenting to the ED via EMS. She notes that her sheets were wet with sweat upon waking. She also noted shooting back pain in the lower right back and abdominal cramping on the right side. The patient states this abdominal pain is somewhat like the pain she has had with an ovarian cyst on her left side. The pain is relieved by sitting up. She has not found any aggravating factors saying it comes and goes "randomly." She states that she ate leftover fried rice, hotdogs, and fish yesterday. She has not tolerated any food today due to nausea. She denies headache, fever, rigors, cough, chest pain, palpitations, dysuria, hematuria, vaginal discharge or bleeding, weakness, numbness/tingling. She denies shortness of breath apart from episodes of vomiting. She does endorse an episode of diarrhea this morning without blood.    Past Medical History  Diagnosis Date  . History of gonorrhea   . History of chlamydia   . Herpes     frequent outbreaks on valtrex 500  . Abnormal Pap smear   . Depression     while pregnant was on zoloft but now counseling  . Erb's palsy     L arm   Past Surgical History  Procedure Laterality Date  . No past surgeries     Family History  Problem Relation Age of Onset  . Depression Mother   . Diabetes Mother   . Hypertension Mother   . Asthma Sister   . Asthma Daughter   . Hypertension Maternal Grandmother    History  Substance Use Topics  . Smoking status: Current  Every Day Smoker -- 0.50 packs/day    Types: Cigarettes  . Smokeless tobacco: Never Used  . Alcohol Use: No     Comment: occ   OB History    Gravida Para Term Preterm AB TAB SAB Ectopic Multiple Living   Review of Systems  All other systems negative except as documented in the HPI. All pertinent positives and negatives as reviewed in the HPI.   Allergies  Review of patient's allergies indicates no known allergies.  Home Medications   Prior to Admission medications   Medication Sig Start Date End Date Taking? Authorizing Provider  ibuprofen (ADVIL,MOTRIN) 200 MG tablet Take 1,000 mg by mouth every 6 (six) hours as needed for headache or moderate pain.   Yes Historical Provider, MD  oxyCODONE-acetaminophen (ROXICET) 5-325 MG per tablet Take 1-2 tablets by mouth every 4 (four) hours as needed for severe pain. 06/03/14  Yes Scot Jun Teague Clark, PA-C  valACYclovir (VALTREX) 500 MG tablet Take 500 mg by mouth 2 (two) times daily as needed (outbreak.).   Yes Historical Provider, MD  ibuprofen (ADVIL,MOTRIN) 600 MG tablet Take 1 tablet (600 mg total) by mouth every 6 (six) hours as needed for pain. Patient not taking: Reported on 09/06/2014 03/11/12   Benjiman Core, MD   BP 106/66 mmHg  Pulse 62  Temp(Src) 97.3 F (36.3 C) (Oral)  Resp 16  SpO2 100% Physical Exam  Constitutional: She is oriented to person, place, and time. She appears well-developed and well-nourished. No distress.  HENT:  Head: Normocephalic and atraumatic.  Mouth/Throat: Oropharynx is clear and moist.  Eyes: EOM are normal. Pupils are equal, round, and reactive to light.  Neck: Normal range of motion. Neck supple.  Cardiovascular: Normal rate, regular rhythm, normal heart sounds and intact distal pulses.  Exam reveals no gallop and no friction rub.   No murmur heard. Pulmonary/Chest: Effort normal and breath sounds normal. No respiratory distress.  Abdominal: Soft. Bowel sounds are normal.  She exhibits no distension and no mass. There is tenderness in the right upper quadrant, right lower quadrant and epigastric area. There is no rebound and no CVA tenderness.  Musculoskeletal:       Lumbar back: She exhibits normal range of motion, no tenderness, no deformity and no laceration.  Lymphadenopathy:    She has no cervical adenopathy.  Neurological: She is alert and oriented to person, place, and time.  Skin: Skin is warm and dry. No rash noted. She is not diaphoretic. No erythema.  Psychiatric: She has a normal mood and affect.  Nursing note and vitals reviewed.   ED Course  Procedures (including critical care time) Labs Review Labs Reviewed  COMPREHENSIVE METABOLIC PANEL - Abnormal; Notable for the following:    Total Bilirubin 0.2 (*)    All other components within normal limits  URINALYSIS, ROUTINE W REFLEX MICROSCOPIC  CBC WITH DIFFERENTIAL/PLATELET  POC URINE PREG, ED    Imaging Review No results found.  Patient is feeling better following pain medications and antiemetics with fluids. Awaiting CT scan findings. Santiago GladHeather Laisure PA-C will follow up on the scan   Charlestine NightChristopher Loveda Colaizzi, PA-C 09/07/14 [phone]  Glynn OctaveStephen Rancour, MD 09/07/14 [phone]

## 2014-09-06 NOTE — ED Notes (Signed)
Pt ambulated to restroom with steady gait.

## 2014-09-06 NOTE — ED Notes (Signed)
Family updated on status (awaiting Ct results)

## 2014-09-06 NOTE — ED Provider Notes (Signed)
4:15 PM Patient signed out to me by Ebbie Ridgehris Lawyer, PA-C at shift change.  Patient presented today with nausea, vomiting, and diarrhea onset earlier today.  She was also complaining of flank pain.  Symptoms improved at this time.  CT renal stone study pending.  Plan is for the patient to be discharged home if negative.    4:50 PM IMPRESSION: 1. 5 mm nonobstructive calculus in the lower pole collecting system of the left kidney. 2. No ureteral stones or findings of urinary tract obstruction are noted at this time. 3. Trace volume of free fluid in the cul-de-sac, presumably physiologic in this young female patient. 4. Normal appendix.  CT scan not showing any obstructive stone.  Feel that the patient is stable for discharge.  Discussed CT results with patient at this time.  She reports that pain and nausea are controlled at this time.  Santiago GladHeather Hyman Crossan, PA-C 09/06/14 2100  Elwin MochaBlair Walden, MD 09/06/14 [phone]

## 2014-09-06 NOTE — Discharge Instructions (Signed)
Return here as needed.  Follow-up with your primary care doctor.  Slowly increase her fluid intake.  I think the nausea and vomiting is related to a viral type illness and then GI tract

## 2015-01-15 ENCOUNTER — Encounter (HOSPITAL_COMMUNITY): Payer: Self-pay | Admitting: *Deleted

## 2015-01-15 ENCOUNTER — Inpatient Hospital Stay (HOSPITAL_COMMUNITY): Payer: Medicaid Other

## 2015-01-15 ENCOUNTER — Inpatient Hospital Stay (HOSPITAL_COMMUNITY)
Admission: AD | Admit: 2015-01-15 | Discharge: 2015-01-15 | Disposition: A | Discharge status: Home / Self Care | Payer: Self-pay | Source: Ambulatory Visit | Attending: Family Medicine | Admitting: Family Medicine

## 2015-01-15 DIAGNOSIS — F17210 Nicotine dependence, cigarettes, uncomplicated: Secondary | ICD-10-CM | POA: Insufficient documentation

## 2015-01-15 DIAGNOSIS — R102 Pelvic and perineal pain: Secondary | ICD-10-CM

## 2015-01-15 DIAGNOSIS — L02221 Furuncle of abdominal wall: Secondary | ICD-10-CM | POA: Insufficient documentation

## 2015-01-15 LAB — URINALYSIS, ROUTINE W REFLEX MICROSCOPIC
BILIRUBIN URINE: NEGATIVE
GLUCOSE, UA: NEGATIVE mg/dL
Hgb urine dipstick: NEGATIVE
KETONES UR: NEGATIVE mg/dL
LEUKOCYTES UA: NEGATIVE
Nitrite: NEGATIVE
PROTEIN: NEGATIVE mg/dL
SPECIFIC GRAVITY, URINE: 1.015 (ref 1.005–1.030)
Urobilinogen, UA: 0.2 mg/dL (ref 0.0–1.0)
pH: 8 (ref 5.0–8.0)

## 2015-01-15 LAB — POCT PREGNANCY, URINE: PREG TEST UR: NEGATIVE

## 2015-01-15 MED ORDER — CEPHALEXIN 500 MG PO CAPS: 500.0000 mg | ORAL_CAPSULE | Freq: Four times a day (QID) | ORAL | Status: DC

## 2015-01-15 NOTE — MAU Note (Signed)
Pt presents complaining of lower abdominal pain and a lump in her vagina. Hx of cyst on left ovary. LMP 01/11/2015. Denies vaginal bleeding or discharge.

## 2015-01-15 NOTE — [facility] Provider Note (Signed)
None     Chief Complaint:  Abdominal Pain   Diamond Burton is  29 y.o. [id number].  Patient's last menstrual period was 01/11/2015.Diamond Burton  Her pregnancy status is negative.  She presents complaining of Abdominal Pain for several weeks. Has had an ovarian cyst and feels like this may be the same thing.  LMP 7/31. C/O "lump" onlower abdomen (not vaginal) for 2 weeks. Tender to touch.  Was thinking that her periods are Irregular, but upon further questioning, she has one q 31 days, but the flow can vary from 3-7 days.    Past Medical History  Diagnosis Date  . History of gonorrhea   . History of chlamydia   . Herpes     frequent outbreaks on valtrex 500  . Abnormal Pap smear   . Depression     while pregnant was on zoloft but now counseling  . Erb's palsy     L arm    Past Surgical History  Procedure Laterality Date  . No past surgeries      Family History  Problem Relation Age of Onset  . Depression Mother   . Diabetes Mother   . Hypertension Mother   . Asthma Sister   . Asthma Daughter   . Hypertension Maternal Grandmother     History  Substance Use Topics  . Smoking status: Current Every Day Smoker -- 0.50 packs/day    Types: Cigarettes  . Smokeless tobacco: Never Used  . Alcohol Use: No     Comment: occ    Allergies: No Known Allergies  Prescriptions prior to admission  Medication Sig Dispense Refill Last Dose  . HYDROcodone-acetaminophen (NORCO/VICODIN) 5-325 MG per tablet Take 1 tablet by mouth every 6 (six) hours as needed for moderate pain. 15 tablet 0   . ibuprofen (ADVIL,MOTRIN) 200 MG tablet Take 1,000 mg by mouth every 6 (six) hours as needed for headache or moderate pain.   Past Week at Unknown time  . ibuprofen (ADVIL,MOTRIN) 600 MG tablet Take 1 tablet (600 mg total) by mouth every 6 (six) hours as needed for pain. (Patient not taking: Reported on 09/06/2014) 20 tablet 0 06/02/2014 at Unknown time  . oxyCODONE-acetaminophen (ROXICET) 5-325 MG per tablet Take  1-2 tablets by mouth every 4 (four) hours as needed for severe pain. 10 tablet 0 didnt fill the prescription  . promethazine (PHENERGAN) 25 MG tablet Take 1 tablet (25 mg total) by mouth every 8 (eight) hours as needed for nausea or vomiting. 15 tablet 0   . valACYclovir (VALTREX) 500 MG tablet Take 500 mg by mouth 2 (two) times daily as needed (outbreak.).   unknown     Review of Systems   Constitutional: Negative for fever and chills Eyes: Negative for visual disturbances Respiratory: Negative for shortness of breath, dyspnea Cardiovascular: Negative for chest pain or palpitations  Gastrointestinal: Negative for vomiting, diarrhea and constipation Genitourinary: Negative for dysuria and urgency Musculoskeletal: Negative for back pain, joint pain, myalgias  Neurological: Negative for dizziness and headaches     Physical Exam   Blood pressure 132/59, pulse 69, temperature 98.1 F (36.7 C), temperature source Oral, resp. rate 18, height 5\' 5"  (1.651 m), weight 100.789 kg (222 lb 3.2 oz), last menstrual period 01/11/2015.  General: General appearance - alert, well appearing, and in no distress Chest - normal resp effort Heart - normal rate and regular rhythm Abdomen - tenderness noted in LLQ.  1-2 cm furuncle noted on lower abdominal wall.  No erythema, not ready  to lance Pelvic - examination not indicated Extremities - no pedal edema noted   Labs: Results for orders placed or performed during the hospital encounter of 01/15/15 (from the past 24 hour(s))  Urinalysis, Routine w reflex microscopic (not at [facility])   Collection Time: 01/15/15  5:26 PM  Result Value Ref Range   Color, Urine YELLOW YELLOW   APPearance CLEAR CLEAR   Specific Gravity, Urine 1.015 1.005 - 1.030   pH 8.0 5.0 - 8.0   Glucose, UA NEGATIVE NEGATIVE mg/dL   Hgb urine dipstick NEGATIVE NEGATIVE   Bilirubin Urine NEGATIVE NEGATIVE   Ketones, ur NEGATIVE NEGATIVE mg/dL   Protein, ur NEGATIVE NEGATIVE mg/dL    Urobilinogen, UA 0.2 0.0 - 1.0 mg/dL   Nitrite NEGATIVE NEGATIVE   Leukocytes, UA NEGATIVE NEGATIVE  Pregnancy, urine POC   Collection Time: 01/15/15  5:51 PM  Result Value Ref Range   Preg Test, Ur NEGATIVE NEGATIVE   Imaging Studies:   CLINICAL DATA:  Low pelvic pain.   EXAM: TRANSABDOMINAL AND TRANSVAGINAL ULTRASOUND OF PELVIS   TECHNIQUE: Both transabdominal and transvaginal ultrasound examinations of the pelvis were performed. Transabdominal technique was performed for global imaging of the pelvis including uterus, ovaries, adnexal regions, and pelvic cul-de-sac. It was necessary to proceed with endovaginal exam following the transabdominal exam to visualize the ovaries and endometrium.   COMPARISON:  06/03/2014   FINDINGS: Uterus   Measurements: 9 x 4 x 5 cm. No fibroids or other mass visualized.   Endometrium   Thickness: 6 mm.  No focal abnormality visualized.   Right ovary   Measurements: 26 x 21 x 21 mm. Normal appearance/no adnexal mass.   Left ovary   Measurements: 26 x 20 x 22 mm. Normal appearance/no adnexal mass.   Other findings   No free fluid.   IMPRESSION: Normal pelvic ultrasound.       Assessment: Abdominal wall furuncle  Plan: Furuncle care, Keflex  QID  CRESENZO-DISHMAN,Afiya Ferrebee

## 2015-01-15 NOTE — Discharge Instructions (Signed)
Abscess An abscess is an infected area that contains a collection of pus and debris.It can occur in almost any part of the body. An abscess is also known as a furuncle or boil. CAUSES  An abscess occurs when tissue gets infected. This can occur from blockage of oil or sweat glands, infection of hair follicles, or a minor injury to the skin. As the body tries to fight the infection, pus collects in the area and creates pressure under the skin. This pressure causes pain. People with weakened immune systems have difficulty fighting infections and get certain abscesses more often.  SYMPTOMS Usually an abscess develops on the skin and becomes a painful mass that is red, warm, and tender. If the abscess forms under the skin, you may feel a moveable soft area under the skin. Some abscesses break open (rupture) on their own, but most will continue to get worse without care. The infection can spread deeper into the body and eventually into the bloodstream, causing you to feel ill.  DIAGNOSIS  Your caregiver will take your medical history and perform a physical exam. A sample of fluid may also be taken from the abscess to determine what is causing your infection. TREATMENT  Your caregiver may prescribe antibiotic medicines to fight the infection. Your caregiver may need to make a small cut (incision) in the abscess to drain the pus. In some cases, gauze is packed into the abscess to reduce pain and to continue draining the area. HOME CARE INSTRUCTIONS   Only take over-the-counter or prescription medicines for pain, discomfort, or fever as directed by your caregiver.  If you were prescribed antibiotics, take them as directed. Finish them even if you start to feel better.  You may apply warm compresses to the area  To avoid spreading the infection:  Keep your draining abscess covered with a bandage.  Wash your hands well.  Do not share personal care items, towels, or whirlpools with others.  Avoid  skin contact with others.  Keep your skin and clothes clean around the abscess.  Keep all follow-up appointments as directed by your caregiver. SEEK MEDICAL CARE IF:   You have increased pain, swelling, redness, fluid drainage, or bleeding.  You have muscle aches, chills, or a general ill feeling.  You have a fever. MAKE SURE YOU:   Understand these instructions.  Will watch your condition.  Will get help right away if you are not doing well or get worse. Document Released: 03/09/2005 Document Revised: 11/29/2011 Document Reviewed: 08/12/2011 [facility] Patient Information 2015 [place], [place]. This information is not intended to replace advice given to you by your health care provider. Make sure you discuss any questions you have with your health care provider.

## 2015-07-19 ENCOUNTER — Encounter (HOSPITAL_COMMUNITY): Payer: Self-pay | Admitting: Nurse Practitioner

## 2015-07-19 ENCOUNTER — Emergency Department (HOSPITAL_COMMUNITY)
Admission: EM | Admit: 2015-07-19 | Discharge: 2015-07-19 | Disposition: A | Discharge status: Home / Self Care | Payer: Self-pay | Attending: Emergency Medicine | Admitting: Emergency Medicine

## 2015-07-19 DIAGNOSIS — N9412 Deep dyspareunia: Secondary | ICD-10-CM | POA: Insufficient documentation

## 2015-07-19 DIAGNOSIS — Z8619 Personal history of other infectious and parasitic diseases: Secondary | ICD-10-CM | POA: Insufficient documentation

## 2015-07-19 DIAGNOSIS — L0291 Cutaneous abscess, unspecified: Secondary | ICD-10-CM

## 2015-07-19 DIAGNOSIS — Z87891 Personal history of nicotine dependence: Secondary | ICD-10-CM | POA: Insufficient documentation

## 2015-07-19 DIAGNOSIS — Z3202 Encounter for pregnancy test, result negative: Secondary | ICD-10-CM | POA: Insufficient documentation

## 2015-07-19 DIAGNOSIS — L02412 Cutaneous abscess of left axilla: Secondary | ICD-10-CM | POA: Insufficient documentation

## 2015-07-19 DIAGNOSIS — N730 Acute parametritis and pelvic cellulitis: Secondary | ICD-10-CM

## 2015-07-19 DIAGNOSIS — Z8659 Personal history of other mental and behavioral disorders: Secondary | ICD-10-CM | POA: Insufficient documentation

## 2015-07-19 DIAGNOSIS — L03112 Cellulitis of left axilla: Secondary | ICD-10-CM

## 2015-07-19 DIAGNOSIS — K0889 Other specified disorders of teeth and supporting structures: Secondary | ICD-10-CM

## 2015-07-19 HISTORY — DX: Periodontal disease, unspecified: K05.6

## 2015-07-19 LAB — CBC WITH DIFFERENTIAL/PLATELET
BASOS ABS: 0.1 10*3/uL (ref 0.0–0.1)
BASOS PCT: 1 %
EOS ABS: 0.1 10*3/uL (ref 0.0–0.7)
EOS PCT: 2 %
HCT: 41.8 % (ref 36.0–46.0)
Hemoglobin: 14.5 g/dL (ref 12.0–15.0)
LYMPHS PCT: 35 %
Lymphs Abs: 2.2 10*3/uL (ref 0.7–4.0)
MCH: 30.2 pg (ref 26.0–34.0)
MCHC: 34.7 g/dL (ref 30.0–36.0)
MCV: 87.1 fL (ref 78.0–100.0)
MONO ABS: 0.6 10*3/uL (ref 0.1–1.0)
Monocytes Relative: 9 %
Neutro Abs: 3.3 10*3/uL (ref 1.7–7.7)
Neutrophils Relative %: 53 %
PLATELETS: 243 10*3/uL (ref 150–400)
RBC: 4.8 MIL/uL (ref 3.87–5.11)
RDW: 13.2 % (ref 11.5–15.5)
WBC: 6.2 10*3/uL (ref 4.0–10.5)

## 2015-07-19 LAB — I-STAT BETA HCG BLOOD, ED (MC, WL, AP ONLY)

## 2015-07-19 LAB — URINALYSIS, ROUTINE W REFLEX MICROSCOPIC
Bilirubin Urine: NEGATIVE
GLUCOSE, UA: NEGATIVE mg/dL
Hgb urine dipstick: NEGATIVE
KETONES UR: 40 mg/dL — AB
NITRITE: NEGATIVE
PH: 5.5 (ref 5.0–8.0)
PROTEIN: NEGATIVE mg/dL
Specific Gravity, Urine: 1.013 (ref 1.005–1.030)

## 2015-07-19 LAB — URINE MICROSCOPIC-ADD ON: RBC / HPF: NONE SEEN RBC/hpf (ref 0–5)

## 2015-07-19 LAB — WET PREP, GENITAL
SPERM: NONE SEEN
Trich, Wet Prep: NONE SEEN
YEAST WET PREP: NONE SEEN

## 2015-07-19 LAB — COMPREHENSIVE METABOLIC PANEL
ALBUMIN: 3.9 g/dL (ref 3.5–5.0)
ALT: 16 U/L (ref 14–54)
AST: 21 U/L (ref 15–41)
Alkaline Phosphatase: 80 U/L (ref 38–126)
Anion gap: 13 (ref 5–15)
BUN: 10 mg/dL (ref 6–20)
CHLORIDE: 101 mmol/L (ref 101–111)
CO2: 21 mmol/L — AB (ref 22–32)
CREATININE: 0.9 mg/dL (ref 0.44–1.00)
Calcium: 9.3 mg/dL (ref 8.9–10.3)
GFR calc Af Amer: >60 mL/min (ref >60)
Glucose, Bld: 79 mg/dL (ref 65–99)
POTASSIUM: 3.9 mmol/L (ref 3.5–5.1)
SODIUM: 135 mmol/L (ref 135–145)
Total Bilirubin: 0.7 mg/dL (ref 0.3–1.2)
Total Protein: 8.1 g/dL (ref 6.5–8.1)

## 2015-07-19 LAB — LIPASE, BLOOD: LIPASE: 33 U/L (ref 11–51)

## 2015-07-19 MED ORDER — METRONIDAZOLE 500 MG PO TABS: 500.0000 mg | ORAL_TABLET | Freq: Two times a day (BID) | ORAL | Status: DC

## 2015-07-19 MED ORDER — HYDROCODONE-ACETAMINOPHEN 5-325 MG PO TABS: 1.0000 | ORAL_TABLET | Freq: Four times a day (QID) | ORAL | Status: DC | PRN

## 2015-07-19 MED ORDER — AZITHROMYCIN 250 MG PO TABS
1000.0000 mg | ORAL_TABLET | Freq: Once | ORAL | Status: AC
  Administered 2015-07-19: 1000 mg via ORAL
  Filled 2015-07-19: qty 4

## 2015-07-19 MED ORDER — HYDROCODONE-ACETAMINOPHEN 5-325 MG PO TABS
2.0000 | ORAL_TABLET | Freq: Once | ORAL | Status: AC
  Administered 2015-07-19: 2 via ORAL
  Filled 2015-07-19: qty 2

## 2015-07-19 MED ORDER — DOXYCYCLINE HYCLATE 100 MG PO CAPS: 100.0000 mg | ORAL_CAPSULE | Freq: Two times a day (BID) | ORAL | Status: DC

## 2015-07-19 MED ORDER — CEFTRIAXONE SODIUM 250 MG IJ SOLR
250.0000 mg | Freq: Once | INTRAMUSCULAR | Status: AC
  Administered 2015-07-19: 250 mg via INTRAMUSCULAR
  Filled 2015-07-19: qty 250

## 2015-07-19 MED ORDER — LIDOCAINE-EPINEPHRINE (PF) 2 %-1:200000 IJ SOLN
10.0000 mL | Freq: Once | INTRAMUSCULAR | Status: DC
  Filled 2015-07-19: qty 10

## 2015-07-19 NOTE — ED Notes (Signed)
Pt states she is here for multiple complaints. She c/o painful abscess with purulent drainage, redness to L armpit onset last week. She also reports painful red sores to L breast around nipple since last week. She also states her entire gums are hurintg, severe pain, "feels like they are about to come out my mouth," states it keeps her up at night and she has been taking aleve with some relief but it wears off before time for the next dose. She reports spotting blood after sex 3 weeks ago and then she had irregular vaginal bleeding since. She has noticed some mild pelvic cramps also

## 2015-07-19 NOTE — Discharge Instructions (Signed)
Cellulitis °Cellulitis is an infection of the skin and the tissue beneath it. The infected area is usually red and tender. Cellulitis occurs most often in the arms and lower legs.  °CAUSES  °Cellulitis is caused by bacteria that enter the skin through cracks or cuts in the skin. The most common types of bacteria that cause cellulitis are staphylococci and streptococci. °SIGNS AND SYMPTOMS  °· Redness and warmth. °· Swelling. °· Tenderness or pain. °· Fever. °DIAGNOSIS  °Your health care provider can usually determine what is wrong based on a physical exam. Blood tests may also be done. °TREATMENT  °Treatment usually involves taking an antibiotic medicine. °HOME CARE INSTRUCTIONS  °· Take your antibiotic medicine as directed by your health care provider. Finish the antibiotic even if you start to feel better. °· Keep the infected arm or leg elevated to reduce swelling. °· Apply a warm cloth to the affected area up to 4 times per day to relieve pain. °· Take medicines only as directed by your health care provider. °· Keep all follow-up visits as directed by your health care provider. °SEEK MEDICAL CARE IF:  °· You notice red streaks coming from the infected area. °· Your red area gets larger or turns dark in color. °· Your bone or joint underneath the infected area becomes painful after the skin has healed. °· Your infection returns in the same area or another area. °· You notice a swollen bump in the infected area. °· You develop new symptoms. °· You have a fever. °SEEK IMMEDIATE MEDICAL CARE IF:  °· You feel very sleepy. °· You develop vomiting or diarrhea. °· You have a general ill feeling (malaise) with muscle aches and pains. °  °This information is not intended to replace advice given to you by your health care provider. Make sure you discuss any questions you have with your health care provider. °  °Document Released: 03/09/2005 Document Revised: 02/18/2015 Document Reviewed: 08/15/2011 °Elsevier Interactive  Patient Education ©2016 [employer] ° °Abscess °An abscess is an infected area that contains a collection of pus and debris. It can occur in almost any part of the body. An abscess is also known as a furuncle or boil. °CAUSES  °An abscess occurs when tissue gets infected. This can occur from blockage of oil or sweat glands, infection of hair follicles, or a minor injury to the skin. As the body tries to fight the infection, pus collects in the area and creates pressure under the skin. This pressure causes pain. People with weakened immune systems have difficulty fighting infections and get certain abscesses more often.  °SYMPTOMS °Usually an abscess develops on the skin and becomes a painful mass that is red, warm, and tender. If the abscess forms under the skin, you may feel a moveable soft area under the skin. Some abscesses break open (rupture) on their own, but most will continue to get worse without care. The infection can spread deeper into the body and eventually into the bloodstream, causing you to feel ill.  °DIAGNOSIS  °Your caregiver will take your medical history and perform a physical exam. A sample of fluid may also be taken from the abscess to determine what is causing your infection. °TREATMENT  °Your caregiver may prescribe antibiotic medicines to fight the infection. However, taking antibiotics alone usually does not cure an abscess. Your caregiver may need to make a small cut (incision) in the abscess to drain the pus. In some cases, gauze is packed into the abscess to reduce   pain and to continue draining the area. HOME CARE INSTRUCTIONS   Only take over-the-counter or prescription medicines for pain, discomfort, or fever as directed by your caregiver.  If you were prescribed antibiotics, take them as directed. Finish them even if you start to feel better.  If gauze is used, follow your caregiver's directions for changing the gauze.  To avoid spreading the infection:  Keep your  draining abscess covered with a bandage.  Wash your hands well.  Do not share personal care items, towels, or whirlpools with others.  Avoid skin contact with others.  Keep your skin and clothes clean around the abscess.  Keep all follow-up appointments as directed by your caregiver. SEEK MEDICAL CARE IF:   You have increased pain, swelling, redness, fluid drainage, or bleeding.  You have muscle aches, chills, or a general ill feeling.  You have a fever. MAKE SURE YOU:   Understand these instructions.  Will watch your condition.  Will get help right away if you are not doing well or get worse.   This information is not intended to replace advice given to you by your health care provider. Make sure you discuss any questions you have with your health care provider.   Document Released: 03/09/2005 Document Revised: 11/29/2011 Document Reviewed: 08/12/2011 Elsevier Interactive Patient Education 2016 [employer] Pelvic Inflammatory Disease Pelvic inflammatory disease (PID) refers to an infection in some or all of the female organs. The infection can be in the uterus, ovaries, fallopian tubes, or the surrounding tissues in the pelvis. PID can cause abdominal or pelvic pain that comes on suddenly (acute pelvic pain). PID is a serious infection because it can lead to lasting (chronic) pelvic pain or the inability to have children (infertility). CAUSES This condition is most often caused by an infection that is spread during sexual contact. However, the infection can also be caused by the normal bacteria that are found in the vaginal tissues if these bacteria travel upward into the reproductive organs. PID can also occur following:  The birth of a baby.  A miscarriage.  An abortion.  Major pelvic surgery.  The use of an intrauterine device (IUD).  A sexual assault. RISK FACTORS This condition is more likely to develop in women who:  Are younger than 31 years of age.  Are  sexually active at [facility] age.  Use nonbarrier contraception.  Have multiple sexual partners.  Have sex with someone who has symptoms of an STD (sexually transmitted disease).  Use oral contraception. At times, certain behaviors can also increase the possibility of getting PID, such as:  Using a vaginal douche.  Having an IUD in place. SYMPTOMS Symptoms of this condition include:  Abdominal or pelvic pain.  Fever.  Chills.  Abnormal vaginal discharge.  Abnormal uterine bleeding.  Unusual pain shortly after the end of a menstrual period.  Painful urination.  Pain with sexual intercourse.  Nausea and vomiting. DIAGNOSIS To diagnose this condition, your health care provider will do a physical exam and take your medical history. A pelvic exam typically reveals great tenderness in the uterus and the surrounding pelvic tissues. You may also have tests, such as:  Lab tests, including a pregnancy test, blood tests, and urine test.  Culture tests of the vagina and cervix to check for an STD.  Ultrasound.  A laparoscopic procedure to look inside the pelvis.  Examining vaginal secretions under a microscope. TREATMENT Treatment for this condition may involve one or more approaches.  Antibiotic medicines may  be prescribed to be taken by mouth.  Sexual partners may need to be treated if the infection is caused by an STD.  For more severe cases, hospitalization may be needed to give antibiotics directly into a vein through an IV tube.  Surgery may be needed if other treatments do not help, but this is rare. It may take weeks until you are completely well. If you are diagnosed with PID, you should also be checked for human immunodeficiency virus (HIV). Your health care provider may test you for infection again 3 months after treatment. You should not have unprotected sex. HOME CARE INSTRUCTIONS  Take over-the-counter and prescription medicines only as told by your health  care provider.  If you were prescribed an antibiotic medicine, take it as told by your health care provider. Do not stop taking the antibiotic even if you start to feel better.  Do not have sexual intercourse until treatment is completed or as told by your health care provider. If PID is confirmed, your recent sexual partners will need treatment, especially if you had unprotected sex.  Keep all follow-up visits as told by your health care provider. This is important. SEEK MEDICAL CARE IF:  You have increased or abnormal vaginal discharge.  Your pain does not improve.  You vomit.  You have a fever.  You cannot tolerate your medicines.  Your partner has an STD.  You have pain when you urinate. SEEK IMMEDIATE MEDICAL CARE IF:  You have increased abdominal or pelvic pain.  You have chills.  Your symptoms are not better in 72 hours even with treatment.   This information is not intended to replace advice given to you by your health care provider. Make sure you discuss any questions you have with your health care provider.   Document Released: 05/30/2005 Document Revised: 02/18/2015 Document Reviewed: 07/07/2014 Elsevier Interactive Patient Education [employer]

## 2015-07-19 NOTE — ED Provider Notes (Signed)
CSN: [id number]     Arrival date & time 07/19/15  1323 History   First MD Initiated Contact with Patient 07/19/15 1626     Chief Complaint  Patient presents with  . Skin Problem  . Vaginal Bleeding  . Dental Pain     (Consider location/radiation/quality/duration/timing/severity/associated sxs/prior Treatment) HPI Comments: Patient presents to the emergency department with multiple complaints.  1. Pelvic pain: Patient states that she has had some vaginal discharge, and some mild to moderate bilateral pelvic pain. She reports having some cramping. She reports having some spotting after intercourse 3 weeks ago. She is tried taking ibuprofen with no relief.  2. Dental pain: Patient states that she has gum disease. States that her gums have been hurting her. She denies any bleeding. Denies any associated fevers, chills, nausea, or vomiting. She does not have a dentist. She has tried ibuprofen with no relief.  3. Abscess: Patient complains of a left axillary abscess. She states that it has been developing over the past week. It has ruptured and is oozing purulent discharge. She denies any associated fevers or chills. Her symptoms are aggravated with palpation. She has tried ibuprofen with no relief.  The history is provided by the patient. No language interpreter was used.    Past Medical History  Diagnosis Date  . History of gonorrhea   . History of chlamydia   . Herpes     frequent outbreaks on valtrex 500  . Abnormal Pap smear   . Depression     while pregnant was on zoloft but now counseling  . Erb's palsy     L arm  . Periodontal disease    Past Surgical History  Procedure Laterality Date  . No past surgeries     Family History  Problem Relation Age of Onset  . Depression Mother   . Diabetes Mother   . Hypertension Mother   . Asthma Sister   . Asthma Daughter   . Hypertension Maternal Grandmother    Social History  Substance Use Topics  . Smoking status: Former Smoker  -- 0.50 packs/day    Types: Cigarettes  . Smokeless tobacco: Never Used  . Alcohol Use: Yes     Comment: occ   OB History    Gravida Para Term Preterm AB TAB SAB Ectopic Multiple Living   Review of Systems  Constitutional: Negative for fever and chills.  HENT: Positive for dental problem.   Respiratory: Negative for shortness of breath.   Cardiovascular: Negative for chest pain.  Gastrointestinal: Negative for nausea, vomiting, diarrhea and constipation.  Genitourinary: Positive for vaginal discharge and dyspareunia. Negative for dysuria.  Skin: Positive for color change and wound.  All other systems reviewed and are negative.     Allergies  Review of patient's allergies indicates no known allergies.  Home Medications   Prior to Admission medications   Medication Sig Start Date End Date Taking? Authorizing Provider  ibuprofen (ADVIL,MOTRIN) 200 MG tablet Take 800 mg by mouth every 6 (six) hours as needed for headache or moderate pain.    Yes Historical Provider, MD  naproxen sodium (ALEVE) 220 MG tablet Take 660 mg by mouth 2 (two) times daily as needed (pain).   Yes Historical Provider, MD  cephALEXin (KEFLEX) 500 MG capsule Take 1 capsule (500 mg total) by mouth 4 (four) times daily. Patient not taking: Reported on 07/19/2015 01/15/15   Jacklyn Shell, CNM  valACYclovir (VALTREX) 500 MG tablet Take 500 mg by mouth 2 (two) times daily as needed (outbreak.).    Historical Provider, MD   BP 136/95 mmHg  Pulse 68  Temp(Src) 97.9 F (36.6 C) (Oral)  Resp 22  SpO2 100% Physical Exam  Constitutional: She is oriented to person, place, and time. She appears well-developed and well-nourished.  HENT:  Head: Normocephalic and atraumatic.  Eyes: Conjunctivae and EOM are normal. Pupils are equal, round, and reactive to light.  Neck: Normal range of motion. Neck supple.  Cardiovascular: Normal rate and regular rhythm.  Exam reveals no gallop and no friction  rub.   No murmur heard. Pulmonary/Chest: Effort normal and breath sounds normal. No respiratory distress. She has no wheezes. She has no rales. She exhibits no tenderness.  Abdominal: Soft. Bowel sounds are normal. She exhibits no distension and no mass. There is no tenderness. There is no rebound and no guarding.  Genitourinary:  Pelvic exam chaperoned by female ER tech, no right or left adnexal tenderness, no uterine tenderness, moderate white vaginal discharge, no bleeding, no CMT or friability, no foreign body, no injury to the external genitalia, no other significant findings   Musculoskeletal: Normal range of motion. She exhibits no edema or tenderness.  Neurological: She is alert and oriented to person, place, and time.  Skin: Skin is warm and dry.  2 x 3 cm Draining abscess to left axilla, no induration  Psychiatric: She has a normal mood and affect. Her behavior is normal. Judgment and thought content normal.  Nursing note and vitals reviewed.   ED Course  Procedures (including critical care time) Results for orders placed or performed during the hospital encounter of 07/19/15  Wet prep, genital  Result Value Ref Range   Yeast Wet Prep HPF POC NONE SEEN NONE SEEN   Trich, Wet Prep NONE SEEN NONE SEEN   Clue Cells Wet Prep HPF POC PRESENT (A) NONE SEEN   WBC, Wet Prep HPF POC MANY (A) NONE SEEN   Sperm NONE SEEN   CBC with Differential/Platelet  Result Value Ref Range   WBC 6.2 4.0 - 10.5 K/uL   RBC 4.80 3.87 - 5.11 MIL/uL   Hemoglobin 14.5 12.0 - 15.0 g/dL   HCT 16.1 09.6 - 04.5 %   MCV 87.1 78.0 - 100.0 fL   MCH 30.2 26.0 - 34.0 pg   MCHC 34.7 30.0 - 36.0 g/dL   RDW 40.9 81.1 - 91.4 %   Platelets 243 150 - 400 K/uL   Neutrophils Relative % 53 %   Neutro Abs 3.3 1.7 - 7.7 K/uL   Lymphocytes Relative 35 %   Lymphs Abs 2.2 0.7 - 4.0 K/uL   Monocytes Relative 9 %   Monocytes Absolute 0.6 0.1 - 1.0 K/uL   Eosinophils Relative 2 %   Eosinophils Absolute 0.1 0.0 - 0.7  K/uL   Basophils Relative 1 %   Basophils Absolute 0.1 0.0 - 0.1 K/uL  Comprehensive metabolic panel  Result Value Ref Range   Sodium 135 135 - 145 mmol/L   Potassium 3.9 3.5 - 5.1 mmol/L   Chloride 101 101 - 111 mmol/L   CO2 21 (L) 22 - 32 mmol/L   Glucose, Bld 79 65 - 99 mg/dL   BUN 10 6 - 20 mg/dL   Creatinine, Ser 7.82 0.44 - 1.00 mg/dL   Calcium 9.3 8.9 - 95.6 mg/dL   Total Protein 8.1 6.5 - 8.1 g/dL   Albumin 3.9 3.5 - 5.0  g/dL   AST 21 15 - 41 U/L   ALT 16 14 - 54 U/L   Alkaline Phosphatase 80 38 - 126 U/L   Total Bilirubin 0.7 0.3 - 1.2 mg/dL   GFR calc non Af Amer >60 >60 mL/min   GFR calc Af Amer >60 >60 mL/min   Anion gap 13 5 - 15  Lipase, blood  Result Value Ref Range   Lipase 33 11 - 51 U/L  Urinalysis, Routine w reflex microscopic (not at [facility])  Result Value Ref Range   Color, Urine YELLOW YELLOW   APPearance CLOUDY (A) CLEAR   Specific Gravity, Urine 1.013 1.005 - 1.030   pH 5.5 5.0 - 8.0   Glucose, UA NEGATIVE NEGATIVE mg/dL   Hgb urine dipstick NEGATIVE NEGATIVE   Bilirubin Urine NEGATIVE NEGATIVE   Ketones, ur 40 (A) NEGATIVE mg/dL   Protein, ur NEGATIVE NEGATIVE mg/dL   Nitrite NEGATIVE NEGATIVE   Leukocytes, UA TRACE (A) NEGATIVE  Urine microscopic-add on  Result Value Ref Range   Squamous Epithelial / LPF 6-30 (A) NONE SEEN   WBC, UA 6-30 0 - 5 WBC/hpf   RBC / HPF NONE SEEN 0 - 5 RBC/hpf   Bacteria, UA RARE (A) NONE SEEN  I-Stat beta hCG blood, ED ([facility], [facility], [facility] only)  Result Value Ref Range   I-stat hCG, quantitative <5.0 <5 mIU/mL   Comment 3           No results found.  I have personally reviewed and evaluated these images and lab results as part of my medical decision-making.   EKG Interpretation None      MDM   Final diagnoses:  PID (acute pelvic inflammatory disease)  Abscess  Cellulitis of left axilla  Pain, dental    1. Vaginal discharge/pelvic cramps: Patient had copious amount of vaginal discharge. No bleeding. She does  have dyspareunia. Concern for PID. Will treat with Rocephin, azithromycin, and doxycycline. Wet prep is pending.  2. Dental pain: Patient will need to follow-up with her dentist. No evidence of abscess.  3. Abscess: Patient's left axillary abscess is draining spontaneously. Incision and drainage in ED. Moderate amount of purulent discharge. Patient should have adequate coverage with the doxycycline.  Patient will need to follow-up with her primary care provider and her OB/GYN. Treatment plan discussed in detail with the patient. She has been given written and verbal instructions. She is stable and ready for discharge.   Roxy Horseman, PA-C 07/19/15 1930  Arby Barrette, MD 07/19/15 [phone]

## 2015-07-20 LAB — GC/CHLAMYDIA PROBE AMP (~~LOC~~) NOT AT ARMC
Chlamydia: POSITIVE — AB
Neisseria Gonorrhea: NEGATIVE

## 2015-07-21 ENCOUNTER — Telehealth (HOSPITAL_COMMUNITY): Payer: Self-pay

## 2015-07-21 NOTE — Telephone Encounter (Signed)
Spoke with pt. Verified ID. Informed of labs. Treated per protocol. [employer]HS form faxed. Pt informed to abstain from sexual activity x 10 days and to notify partner for testing and treatment.  

## 2015-07-21 NOTE — Telephone Encounter (Signed)
Positive for chlamydia Treated per protocol. [employer] form faxed. Attempting to contact. Unable to reach by telephone. Letter sent to address on record.  

## 2015-08-01 ENCOUNTER — Telehealth (HOSPITAL_COMMUNITY): Payer: Self-pay

## 2015-08-01 NOTE — Telephone Encounter (Signed)
Unable to contact pt by mail or telephone. Unable to communicate lab results or treatment changes. 

## 2015-08-20 ENCOUNTER — Emergency Department (HOSPITAL_COMMUNITY)
Admission: EM | Admit: 2015-08-20 | Discharge: 2015-08-20 | Disposition: A | Discharge status: Home / Self Care | Payer: Self-pay | Attending: Emergency Medicine | Admitting: Emergency Medicine

## 2015-08-20 ENCOUNTER — Encounter (HOSPITAL_COMMUNITY): Payer: Self-pay | Admitting: Cardiology

## 2015-08-20 ENCOUNTER — Other Ambulatory Visit (HOSPITAL_COMMUNITY)
Admission: RE | Admit: 2015-08-20 | Discharge: 2015-08-20 | Disposition: A | Discharge status: Home / Self Care | Payer: Self-pay | Source: Ambulatory Visit | Attending: Emergency Medicine | Admitting: Emergency Medicine

## 2015-08-20 ENCOUNTER — Emergency Department (INDEPENDENT_AMBULATORY_CARE_PROVIDER_SITE_OTHER)
Admission: EM | Admit: 2015-08-20 | Discharge: 2015-08-20 | Disposition: A | Discharge status: Home / Self Care | Payer: Self-pay | Source: Home / Self Care | Attending: Emergency Medicine | Admitting: Emergency Medicine

## 2015-08-20 ENCOUNTER — Encounter (HOSPITAL_COMMUNITY): Payer: Self-pay | Admitting: *Deleted

## 2015-08-20 DIAGNOSIS — Z202 Contact with and (suspected) exposure to infections with a predominantly sexual mode of transmission: Secondary | ICD-10-CM | POA: Insufficient documentation

## 2015-08-20 DIAGNOSIS — N898 Other specified noninflammatory disorders of vagina: Secondary | ICD-10-CM

## 2015-08-20 DIAGNOSIS — N760 Acute vaginitis: Secondary | ICD-10-CM | POA: Insufficient documentation

## 2015-08-20 DIAGNOSIS — R110 Nausea: Secondary | ICD-10-CM | POA: Insufficient documentation

## 2015-08-20 DIAGNOSIS — L02419 Cutaneous abscess of limb, unspecified: Secondary | ICD-10-CM

## 2015-08-20 DIAGNOSIS — Z113 Encounter for screening for infections with a predominantly sexual mode of transmission: Secondary | ICD-10-CM | POA: Insufficient documentation

## 2015-08-20 DIAGNOSIS — L02411 Cutaneous abscess of right axilla: Secondary | ICD-10-CM | POA: Insufficient documentation

## 2015-08-20 LAB — POCT PREGNANCY, URINE: Preg Test, Ur: NEGATIVE

## 2015-08-20 MED ORDER — TRAMADOL HCL 50 MG PO TABS: 50.0000 mg | ORAL_TABLET | Freq: Four times a day (QID) | ORAL | Status: DC | PRN

## 2015-08-20 MED ORDER — SULFAMETHOXAZOLE-TRIMETHOPRIM 800-160 MG PO TABS: 1.0000 | ORAL_TABLET | Freq: Two times a day (BID) | ORAL | Status: AC

## 2015-08-20 NOTE — ED Notes (Signed)
Pt  Reports   Vaginal  Discharge     X  1  Week      -  Pt  Wants  To  Be  Checked  For  Std          Pt  Reports     Had    STD  THE  NOT  SO  DISTANT  PAST      SHE  ALSO  REPORTS   BOILS  UNDER  ARMS

## 2015-08-20 NOTE — ED Notes (Signed)
Pt reports she has been having vaginal dc for the past week. States she has also been nauseated and has boils under her right arm.

## 2015-08-20 NOTE — Discharge Instructions (Signed)
Abscess An abscess (boil or furuncle) is an infected area on or under the skin. This area is filled with yellowish-white fluid (pus) and other material (debris). HOME CARE   Only take medicines as told by your doctor.  If you were given antibiotic medicine, take it as directed. Finish the medicine even if you start to feel better.  If gauze is used, follow your doctor's directions for changing the gauze.  To avoid spreading the infection:  Keep your abscess covered with a bandage.  Wash your hands well.  Do not share personal care items, towels, or whirlpools with others.  Avoid skin contact with others.  Keep your skin and clothes clean around the abscess.  Keep all doctor visits as told. GET HELP RIGHT AWAY IF:   You have more pain, puffiness (swelling), or redness in the wound site.  You have more fluid or blood coming from the wound site.  You have muscle aches, chills, or you feel sick.  You have a fever. MAKE SURE YOU:   Understand these instructions.  Will watch your condition.  Will get help right away if you are not doing well or get worse.   This information is not intended to replace advice given to you by your health care provider. Make sure you discuss any questions you have with your health care provider.   Document Released: 11/16/2007 Document Revised: 11/29/2011 Document Reviewed: 08/13/2011 Elsevier Interactive Patient Education [employer]  Gonorrhea Gonorrhea is an infection that can cause serious problems. If left untreated, the infection may:   Damage the female or female organs.   Cause women to be unable to have children (sterility).   Harm a fetus if the infected woman is pregnant.  It is important to get treatment for gonorrhea as soon as possible. It is also necessary that all your sexual partners be tested for the infection.  CAUSES  Gonorrhea is caused by bacteria called Neisseria gonorrhoeae. The infection is spread from  person to person, usually by sexual contact (such as by anal, vaginal, or oral means). A newborn can contract the infection from his or her mother during birth.  RISK FACTORS  Being a woman younger than 31 years of age who is sexually active.  Being a woman 59 years of age or older who has:  A new sex partner.  More than one sex partner.  A sex partner who has a sexually transmitted disease (STD).  Using condoms inconsistently.  Currently having, or having previously had, an STD.  Exchanging sex or money or drugs. SYMPTOMS  Some people with gonorrhea do not have symptoms. Symptoms may be different in females and males.  Females The most common symptoms are:   Pain in the lower abdomen.   Fever with or without chills.  Other symptoms include:   Abnormal vaginal discharge.   Painful intercourse.   Burning or itching of the vagina or lips of the vagina.   Abnormal vaginal bleeding.   Pain when urinating.   Long-lasting (chronic) pain in the lower abdomen, especially during menstruation or intercourse.   Inability to become pregnant.   Going into premature labor.   Irritation, pain, bleeding, or discharge from the rectum. This may occur if the infection was spread by anal sex.   Sore throat or swollen lymph nodes in the neck. This may occur if the infection was spread by oral sex.  Males The most common symptoms are:   Discharge from the penis.   Pain  or burning during urination.   Pain or swelling in the testicles. Other symptoms may include:   Irritation, pain, bleeding, or discharge from the rectum. This may occur if the infection was spread by anal sex.   Sore throat, fever, or swollen lymph nodes in the neck. This may occur if the infection was spread by oral sex.  DIAGNOSIS  A diagnosis is made after a physical exam is done and a sample of discharge is examined under a microscope for the presence of the bacteria. The discharge may be  taken from the urethra, cervix, throat, or rectum.  TREATMENT  Gonorrhea is treated with antibiotic medicines. It is important for treatment to begin as soon as possible. Early treatment may prevent some problems from developing. Do not have sex. Avoid all types of sexual activity for 7 days after treatment is complete and until any sex partners have been treated. HOME CARE INSTRUCTIONS   Take medicines only as directed by your health care provider.   Take your antibiotic medicine as directed by your health care provider. Finish the antibiotic even if you start to feel better. Incomplete treatment will put you at risk for continued infection.   Do not have sex until treatment is complete or as directed by your health care provider.   Keep all follow-up visits as directed by your health care provider.   Not all test results are available during your visit. If your test results are not back during the visit, make an appointment with your health care provider to find out the results. Do not assume everything is normal if you have not heard from your health care provider or the medical facility. It is your responsibility to get your test results.  If you test positive for gonorrhea, inform your recent sexual partners. They need to be checked for gonorrhea even if they do not have symptoms. They may need treatment, even if they test negative for gonorrhea.  SEEK MEDICAL CARE IF:   You develop any bad reaction to the medicine you were prescribed. This may include:   A rash.   Nausea.   Vomiting.   Diarrhea.   Your symptoms do not improve after a few days of taking antibiotics.   Your symptoms get worse.   You develop increased pain, such as in the testicles (for males) or in the abdomen (for females).  You have a fever. MAKE SURE YOU:   Understand these instructions.  Will watch your condition.  Will get help right away if you are not doing well or get worse.   This  information is not intended to replace advice given to you by your health care provider. Make sure you discuss any questions you have with your health care provider.   Document Released: 05/27/2000 Document Revised: 06/20/2014 Document Reviewed: 12/05/2012 Elsevier Interactive Patient Education 2016 [employer] Chlamydia Test WHY AM I HAVING THIS TEST? This a test to see if you have chlamydia. Chlamydia is a common sexually transmitted disease (STD). Your health care provider may perform this test if you:  Are sexually active.  Have another STD.  Have complaints about pelvic pain, vaginal discharge, or both. WHAT KIND OF SAMPLE IS TAKEN? Depending on your symptoms, your health care provider may collect any one of the following samples:  A blood sample. This is usually collected by inserting a needle into a vein.  A tissue sample. This is collected by swabbing tissue of the eye, urethra, or cervix.  A sample of  sputum. This is collected by having you cough into a sterile container that is provided by the lab. HOW DO I PREPARE FOR THE TEST? There is no preparation required for this test. HOW ARE THE TEST RESULTS REPORTED? Your test results will be reported as either positive or negative. It is your responsibility to obtain your test results. Ask the lab or department performing the test when and how you will get your results. WHAT DO THE RESULTS MEAN? A positive result means that you have a chlamydia infection. Talk with your health care provider to discuss your results, treatment options, and if necessary, the need for more tests. Talk with your health care provider if you have any questions about your results.   This information is not intended to replace advice given to you by your health care provider. Make sure you discuss any questions you have with your health care provider.   Document Released: 06/22/2004 Document Revised: 06/20/2014 Document Reviewed: 10/23/2013 Elsevier  Interactive Patient Education [employer]

## 2015-08-20 NOTE — ED Provider Notes (Signed)
CSN: [id number]     Arrival date & time 08/20/15  1300 History   First MD Initiated Contact with Patient 08/20/15 1315     No chief complaint on file.  (Consider location/radiation/quality/duration/timing/severity/associated sxs/prior Treatment) HPI History obtained from patient:  Pt presents with the cc [id number] discharge Duration of symptoms:1 week Treatment prior to arrival: Recent PID treated at that time.  Pain score:2 Other symptoms include:yellow discharge   Past Medical History  Diagnosis Date  . History of gonorrhea   . History of chlamydia   . Herpes     frequent outbreaks on valtrex 500  . Abnormal Pap smear   . Depression     while pregnant was on zoloft but now counseling  . Erb's palsy     L arm  . Periodontal disease    Past Surgical History  Procedure Laterality Date  . No past surgeries     Family History  Problem Relation Age of Onset  . Depression Mother   . Diabetes Mother   . Hypertension Mother   . Asthma Sister   . Asthma Daughter   . Hypertension Maternal Grandmother    Social History  Substance Use Topics  . Smoking status: Former Smoker -- 0.50 packs/day    Types: Cigarettes  . Smokeless tobacco: Never Used  . Alcohol Use: Yes     Comment: occ   OB History    Gravida Para Term Preterm AB TAB SAB Ectopic Multiple Living   Review of Systems Vaginal discharge Allergies  Review of patient's allergies indicates no known allergies.  Home Medications   Prior to Admission medications   Medication Sig Start Date End Date Taking? Authorizing Provider  cephALEXin (KEFLEX) 500 MG capsule Take 1 capsule (500 mg total) by mouth 4 (four) times daily. Patient not taking: Reported on 07/19/2015 01/15/15   Jacklyn Shell, CNM  doxycycline (VIBRAMYCIN) 100 MG capsule Take 1 capsule (100 mg total) by mouth 2 (two) times daily. 07/19/15   Roxy Horseman, PA-C  HYDROcodone-acetaminophen (NORCO/VICODIN) 5-325 MG tablet Take  1-2 tablets by mouth every 6 (six) hours as needed. 07/19/15   Roxy Horseman, PA-C  ibuprofen (ADVIL,MOTRIN) 200 MG tablet Take 800 mg by mouth every 6 (six) hours as needed for headache or moderate pain.     Historical Provider, MD  metroNIDAZOLE (FLAGYL) 500 MG tablet Take 1 tablet (500 mg total) by mouth 2 (two) times daily. 07/19/15   Roxy Horseman, PA-C  naproxen sodium (ALEVE) 220 MG tablet Take 660 mg by mouth 2 (two) times daily as needed (pain).    Historical Provider, MD  valACYclovir (VALTREX) 500 MG tablet Take 500 mg by mouth 2 (two) times daily as needed (outbreak.).    Historical Provider, MD   Meds Ordered and Administered this Visit  Medications - No data to display  BP 104/73 mmHg  Pulse 62  Temp(Src) 98.3 F (36.8 C) (Oral)  Resp 16  SpO2 100%  LMP 07/27/2015 No data found.   Physical Exam NURSES NOTES AND VITAL SIGNS REVIEWED. CONSTITUTIONAL: Well developed, well nourished, no acute distress HEENT: normocephalic, atraumatic, right and left TM's are normal EYES: Conjunctiva normal NECK:normal ROM, supple, no adenopathy PULMONARY:No respiratory distress, normal effort, Lungs: CTAb/l, no wheezes, or increased work of breathing CARDIOVASCULAR: RRR, no murmur ABDOMEN: soft, ND, NT, +'ve BS MUSCULOSKELETAL: Normal ROM of all extremities,  SKIN: warm and dry without rash PSYCHIATRIC: Mood and  affect, behavior are normal Pelvic exam not done ED Course  Procedures (including critical care time)  Labs Review Labs Reviewed  URINE CYTOLOGY ANCILLARY ONLY    Imaging Review No results found.   Visual Acuity Review  Right Eye Distance:   Left Eye Distance:   Bilateral Distance:    Right Eye Near:   Left Eye Near:    Bilateral Near:         MDM   1. Vaginal discharge   2. Abscess, axilla    Patient is reassured that there are no issues that require transfer to higher level of care at this time.  Patient is advised to continue home symptomatic  treatment. Prescription is sent to  pharmacy patient has indicated.  Patient is advised that if there are new or worsening symptoms or attend the emergency department, or contact primary care provider. Instructions of care provided discharged home in stable condition. Return to work/school note provided.  THIS NOTE WAS GENERATED USING A VOICE RECOGNITION SOFTWARE PROGRAM. ALL REASONABLE EFFORTS  WERE MADE TO PROOFREAD THIS DOCUMENT FOR ACCURACY.     Tharon AquasFrank C Deyona Soza, PA 08/20/15 1415

## 2015-08-21 LAB — URINE CYTOLOGY ANCILLARY ONLY
CHLAMYDIA, DNA PROBE: NEGATIVE
NEISSERIA GONORRHEA: NEGATIVE
TRICH (WINDOWPATH): NEGATIVE

## 2015-08-27 ENCOUNTER — Telehealth (HOSPITAL_COMMUNITY): Payer: Self-pay | Admitting: Emergency Medicine

## 2015-08-27 NOTE — ED Notes (Signed)
x1 attempt  Called pt; # is invalid Need to give lab results from recent visit on 3/8  Per Dr. Dayton ScrapeMurray,  Please let patient know that tests for gonorrhea/chlamydia/trichomonas were negative. Recheck or followup pcp/Bernard Gaynell FaceMarshall for persistent sx's. LM

## 2016-01-27 ENCOUNTER — Encounter (HOSPITAL_COMMUNITY): Payer: Self-pay | Admitting: Emergency Medicine

## 2016-01-27 DIAGNOSIS — Z87891 Personal history of nicotine dependence: Secondary | ICD-10-CM | POA: Insufficient documentation

## 2016-01-27 DIAGNOSIS — L02411 Cutaneous abscess of right axilla: Secondary | ICD-10-CM | POA: Insufficient documentation

## 2016-01-27 MED ORDER — OXYCODONE-ACETAMINOPHEN 5-325 MG PO TABS
1.0000 | ORAL_TABLET | ORAL | Status: DC | PRN
  Administered 2016-01-27: 1 via ORAL
  Filled 2016-01-27: qty 1

## 2016-01-27 NOTE — ED Triage Notes (Signed)
Pt is c/o pain under her right arm  Pt states she has a "gash" under her arm  Pt states it hurts so bad it feels like it is going to fall off

## 2016-01-28 ENCOUNTER — Emergency Department (HOSPITAL_COMMUNITY)
Admission: EM | Admit: 2016-01-28 | Discharge: 2016-01-28 | Disposition: A | Discharge status: Home / Self Care | Payer: Medicaid Other | Attending: Emergency Medicine | Admitting: Emergency Medicine

## 2016-01-28 DIAGNOSIS — L02411 Cutaneous abscess of right axilla: Secondary | ICD-10-CM

## 2016-01-28 MED ORDER — LIDOCAINE-EPINEPHRINE 2 %-1:100000 IJ SOLN
20.0000 mL | Freq: Once | INTRAMUSCULAR | Status: AC
  Administered 2016-01-28: 20 mL
  Filled 2016-01-28: qty 1

## 2016-01-28 MED ORDER — OXYCODONE-ACETAMINOPHEN 5-325 MG PO TABS
1.0000 | ORAL_TABLET | Freq: Once | ORAL | Status: AC
  Administered 2016-01-28: 1 via ORAL
  Filled 2016-01-28: qty 1

## 2016-01-28 MED ORDER — OXYCODONE-ACETAMINOPHEN 5-325 MG PO TABS: 1.0000 | ORAL_TABLET | ORAL | 0 refills | Status: DC | PRN

## 2016-01-28 MED ORDER — DOXYCYCLINE HYCLATE 100 MG PO CAPS: 100.0000 mg | ORAL_CAPSULE | Freq: Two times a day (BID) | ORAL | 0 refills | Status: DC

## 2016-01-28 NOTE — ED Provider Notes (Signed)
[facility]-EMERGENCY DEPT Provider Note   CSN: [id number] Arrival date & time: 01/27/16  2300     History   Chief Complaint Chief Complaint  Patient presents with  . Abscess    HPI Diamond Burton is a 30 y.o. female.  Patient with recurrent axillary abscesses presents with new right abscess x 2 days. No fever. She reports minimal active drainage.    The history is provided by the patient. No language interpreter was used.  Abscess  Associated symptoms: no fever and no nausea     Past Medical History:  Diagnosis Date  . Abnormal Pap smear   . Depression    while pregnant was on zoloft but now counseling  . Erb's palsy    L arm  . Herpes    frequent outbreaks on valtrex 500  . History of chlamydia   . History of gonorrhea   . Periodontal disease     There are no active problems to display for this patient.   Past Surgical History:  Procedure Laterality Date  . NO PAST SURGERIES      OB History    Gravida Para Term Preterm AB Living   2 2 2     2    SAB TAB Ectopic Multiple Live Births           2       Home Medications    Prior to Admission medications   Medication Sig Start Date End Date Taking? Authorizing Provider  cephALEXin (KEFLEX) 500 MG capsule Take 1 capsule (500 mg total) by mouth 4 (four) times daily. Patient not taking: Reported on 07/19/2015 01/15/15   Jacklyn ShellFrances Cresenzo-Dishmon, CNM  doxycycline (VIBRAMYCIN) 100 MG capsule Take 1 capsule (100 mg total) by mouth 2 (two) times daily. 07/19/15   Roxy Horsemanobert Browning, PA-C  HYDROcodone-acetaminophen (NORCO/VICODIN) 5-325 MG tablet Take 1-2 tablets by mouth every 6 (six) hours as needed. 07/19/15   Roxy Horsemanobert Browning, PA-C  ibuprofen (ADVIL,MOTRIN) 200 MG tablet Take 800 mg by mouth every 6 (six) hours as needed for headache or moderate pain.     Historical Provider, MD  metroNIDAZOLE (FLAGYL) 500 MG tablet Take 1 tablet (500 mg total) by mouth 2 (two) times daily. 07/19/15   Roxy Horsemanobert Browning, PA-C  naproxen sodium  (ALEVE) 220 MG tablet Take 660 mg by mouth 2 (two) times daily as needed (pain).    Historical Provider, MD  traMADol (ULTRAM) 50 MG tablet Take 1 tablet (50 mg total) by mouth every 6 (six) hours as needed. 08/20/15   Tharon AquasFrank C Patrick, PA  valACYclovir (VALTREX) 500 MG tablet Take 500 mg by mouth 2 (two) times daily as needed (outbreak.).    Historical Provider, MD    Family History Family History  Problem Relation Age of Onset  . Depression Mother   . Diabetes Mother   . Hypertension Mother   . Asthma Sister   . Asthma Daughter   . Hypertension Maternal Grandmother     Social History Social History  Substance Use Topics  . Smoking status: Former Smoker    Packs/day: 0.50    Types: Cigarettes  . Smokeless tobacco: Never Used  . Alcohol use No     Comment: occ     Allergies   Review of patient's allergies indicates no known allergies.   Review of Systems Review of Systems  Constitutional: Negative for fever.  Gastrointestinal: Negative for nausea.  Musculoskeletal: Positive for myalgias.  Skin: Positive for wound.  Neurological: Negative for numbness.  Physical Exam Updated Vital Signs BP 123/67 (BP Location: Left Arm)   Pulse 88   Resp 20   LMP 12/26/2015   SpO2 99%   Physical Exam  Constitutional: She appears well-developed and well-nourished. No distress.  Cardiovascular: Intact distal pulses.   Musculoskeletal:  Right axilla has widespread induration with 1-2 cm areas of fluctuance x 2, one with minimal active drainage. Significantly tender.   Skin: Skin is warm and dry. There is erythema.  Erythematous area extends to proximal upper arm medially.     ED Treatments / Results  Labs (all labs ordered are listed, but only abnormal results are displayed) Labs Reviewed - No data to display  EKG  EKG Interpretation None       Radiology No results found.  Procedures Procedures (including critical care time) INCISION AND DRAINAGE Performed by:  Elpidio AnisUPSTILL, Avianah Pellman A Consent: Verbal consent obtained. Risks and benefits: risks, benefits and alternatives were discussed Type: abscess  Body area: right axilla  Anesthesia: local infiltration  Incision was made with a scalpel.  Local anesthetic: lidocaine 2% w/epinephrine  Anesthetic total: 2 ml  Complexity: complex Blunt dissection to break up loculations  Drainage: purulent  Drainage amount: moderate, purulent  Packing material: none  Patient tolerance: Patient tolerated the procedure well with no immediate complications.    Medications Ordered in ED Medications  oxyCODONE-acetaminophen (PERCOCET/ROXICET) 5-325 MG per tablet 1 tablet (1 tablet Oral Given 01/27/16 2325)  lidocaine-EPINEPHrine (XYLOCAINE W/EPI) 2 %-1:100000 (with pres) injection 20 mL (not administered)  oxyCODONE-acetaminophen (PERCOCET/ROXICET) 5-325 MG per tablet 1 tablet (1 tablet Oral Given 01/28/16 0334)     Initial Impression / Assessment and Plan / ED Course  I have reviewed the triage vital signs and the nursing notes.  Pertinent labs & imaging results that were available during my care of the patient were reviewed by me and considered in my medical decision making (see chart for details).  Clinical Course    Patient with hidradenitis now with recurrent right axillary abscess requiring I&D. No fever. Has surrounding cellulitis. Will start antibiotics, provide pain management. Recommended 2 day recheck.   Final Clinical Impressions(s) / ED Diagnoses   Final diagnoses:  None   1. Right axillary abscess  New Prescriptions New Prescriptions   No medications on file     Elpidio AnisShari Jhoselyn Ruffini, Cordelia Poche-C 01/28/16 0501    Paula LibraJohn Molpus, MD 01/28/16 [phone]

## 2016-03-08 ENCOUNTER — Encounter: Payer: Self-pay | Admitting: *Deleted

## 2016-03-23 ENCOUNTER — Emergency Department (HOSPITAL_COMMUNITY)
Admission: EM | Admit: 2016-03-23 | Discharge: 2016-03-23 | Disposition: A | Discharge status: Home / Self Care | Payer: Self-pay | Attending: Emergency Medicine | Admitting: Emergency Medicine

## 2016-03-23 ENCOUNTER — Encounter (HOSPITAL_COMMUNITY): Payer: Self-pay

## 2016-03-23 DIAGNOSIS — L02412 Cutaneous abscess of left axilla: Secondary | ICD-10-CM | POA: Insufficient documentation

## 2016-03-23 DIAGNOSIS — Z87891 Personal history of nicotine dependence: Secondary | ICD-10-CM | POA: Insufficient documentation

## 2016-03-23 DIAGNOSIS — L732 Hidradenitis suppurativa: Secondary | ICD-10-CM | POA: Insufficient documentation

## 2016-03-23 DIAGNOSIS — R079 Chest pain, unspecified: Secondary | ICD-10-CM | POA: Insufficient documentation

## 2016-03-23 DIAGNOSIS — Z79899 Other long term (current) drug therapy: Secondary | ICD-10-CM | POA: Insufficient documentation

## 2016-03-23 DIAGNOSIS — Z792 Long term (current) use of antibiotics: Secondary | ICD-10-CM | POA: Insufficient documentation

## 2016-03-23 DIAGNOSIS — R0602 Shortness of breath: Secondary | ICD-10-CM | POA: Insufficient documentation

## 2016-03-23 HISTORY — DX: Hidradenitis suppurativa: L73.2

## 2016-03-23 LAB — I-STAT TROPONIN, ED: Troponin i, poc: 0 ng/mL (ref 0.00–0.08)

## 2016-03-23 LAB — CBC WITH DIFFERENTIAL/PLATELET
Basophils Absolute: 0.1 10*3/uL (ref 0.0–0.1)
Basophils Relative: 1 %
Eosinophils Absolute: 0.1 10*3/uL (ref 0.0–0.7)
Eosinophils Relative: 1 %
HCT: 40.2 % (ref 36.0–46.0)
Hemoglobin: 14 g/dL (ref 12.0–15.0)
Lymphocytes Relative: 27 %
Lymphs Abs: 2.8 10*3/uL (ref 0.7–4.0)
MCH: 29.6 pg (ref 26.0–34.0)
MCHC: 34.8 g/dL (ref 30.0–36.0)
MCV: 85 fL (ref 78.0–100.0)
Monocytes Absolute: 0.7 10*3/uL (ref 0.1–1.0)
Monocytes Relative: 7 %
Neutro Abs: 7 10*3/uL (ref 1.7–7.7)
Neutrophils Relative %: 64 %
Platelets: 300 10*3/uL (ref 150–400)
RBC: 4.73 MIL/uL (ref 3.87–5.11)
RDW: 13.4 % (ref 11.5–15.5)
WBC: 10.7 10*3/uL — ABNORMAL HIGH (ref 4.0–10.5)

## 2016-03-23 MED ORDER — OXYCODONE-ACETAMINOPHEN 5-325 MG PO TABS
2.0000 | ORAL_TABLET | Freq: Once | ORAL | Status: AC
  Administered 2016-03-23: 2 via ORAL
  Filled 2016-03-23: qty 2

## 2016-03-23 MED ORDER — CLINDAMYCIN HCL 300 MG PO CAPS
300.0000 mg | ORAL_CAPSULE | Freq: Once | ORAL | Status: AC
  Administered 2016-03-23: 300 mg via ORAL
  Filled 2016-03-23 (×2): qty 1

## 2016-03-23 MED ORDER — OXYCODONE-ACETAMINOPHEN 5-325 MG PO TABS: 1.0000 | ORAL_TABLET | ORAL | 0 refills | Status: DC | PRN

## 2016-03-23 MED ORDER — LIDOCAINE HCL (PF) 1 % IJ SOLN
30.0000 mL | Freq: Once | INTRAMUSCULAR | Status: AC
  Administered 2016-03-23: 30 mL
  Filled 2016-03-23: qty 30

## 2016-03-23 MED ORDER — SULFAMETHOXAZOLE-TRIMETHOPRIM 800-160 MG PO TABS: 1.0000 | ORAL_TABLET | Freq: Once | ORAL | Status: DC

## 2016-03-23 MED ORDER — OXYCODONE-ACETAMINOPHEN 5-325 MG PO TABS
1.0000 | ORAL_TABLET | ORAL | Status: DC | PRN
  Administered 2016-03-23: 1 via ORAL
  Filled 2016-03-23 (×2): qty 1

## 2016-03-23 MED ORDER — CLINDAMYCIN HCL 300 MG PO CAPS: 300.0000 mg | ORAL_CAPSULE | Freq: Two times a day (BID) | ORAL | 0 refills | Status: DC

## 2016-03-23 NOTE — ED Provider Notes (Signed)
[facility]PT Provider Note   CSN: [id number] Arrival date & time: 03/23/16  1428  By signing my name below, I, Placido SouLogan Joldersma, attest that this documentation has been prepared under the direction and in the presence of Emerson Electriclexandra Safir Michalec, PA-C.  Electronically Signed: Placido SouLogan Joldersma, ED Scribe. 03/23/16. 5:52 PM.    History   Chief Complaint Chief Complaint  Patient presents with  . Recurrent Skin Infections    HPI HPI Comments: Diamond Burton is a 30 y.o. female with a h/o hydradenitis, anxiety and Erb's Palsy who presents to the Emergency Department complaining of multiple worsening points of pain and swelling to her bilateral axillary regions onset a few days ago. Pt reports associated CP and SOB which occurs when her pain worsens and when she is anxious. Patient with mild drainage from the affected regions. Her pain worsens with palpation and UE movement. Pt has no known drug allergies. She denies fevers, chills, nausea, vomiting or any urinary symptoms. Patient recently had an incision and drainage completed 1 month ago. She has not been on antibiotics since that time.  The history is provided by the patient. No language interpreter was used.    Past Medical History:  Diagnosis Date  . Abnormal Pap smear   . Depression    while pregnant was on zoloft but now counseling  . Erb's palsy    L arm  . Herpes    frequent outbreaks on valtrex 500  . History of chlamydia   . History of gonorrhea   . Hydradenitis   . Periodontal disease     Patient Active Problem List   Diagnosis Date Noted  . Hydradenitis     Past Surgical History:  Procedure Laterality Date  . NO PAST SURGERIES      OB History    Gravida Para Term Preterm AB Living   2 2 2     2    SAB TAB Ectopic Multiple Live Births           2       Home Medications    Prior to Admission medications   Medication Sig Start Date End Date Taking? Authorizing Provider  cephALEXin (KEFLEX) 500 MG capsule  Take 1 capsule (500 mg total) by mouth 4 (four) times daily. Patient not taking: Reported on 07/19/2015 01/15/15   Jacklyn ShellFrances Cresenzo-Dishmon, CNM  clindamycin (CLEOCIN) 300 MG capsule Take 1 capsule (300 mg total) by mouth 2 (two) times daily. 03/23/16   Emi HolesAlexandra M Yuette Putnam, PA-C  doxycycline (VIBRAMYCIN) 100 MG capsule Take 1 capsule (100 mg total) by mouth 2 (two) times daily. 01/28/16   Elpidio AnisShari Upstill, PA-C  HYDROcodone-acetaminophen (NORCO/VICODIN) 5-325 MG tablet Take 1-2 tablets by mouth every 6 (six) hours as needed. 07/19/15   Roxy Horsemanobert Browning, PA-C  ibuprofen (ADVIL,MOTRIN) 200 MG tablet Take 800 mg by mouth every 6 (six) hours as needed for headache or moderate pain.     Historical Provider, MD  metroNIDAZOLE (FLAGYL) 500 MG tablet Take 1 tablet (500 mg total) by mouth 2 (two) times daily. 07/19/15   Roxy Horsemanobert Browning, PA-C  naproxen sodium (ALEVE) 220 MG tablet Take 660 mg by mouth 2 (two) times daily as needed (pain).    Historical Provider, MD  oxyCODONE-acetaminophen (PERCOCET/ROXICET) 5-325 MG tablet Take 1-2 tablets by mouth every 4 (four) hours as needed for severe pain. 03/23/16   Emi HolesAlexandra M [place] Oborn, PA-C  traMADol (ULTRAM) 50 MG tablet Take 1 tablet (50 mg total) by mouth every 6 (six) hours as needed. 08/20/15  Tharon Aquas, PA  valACYclovir (VALTREX) 500 MG tablet Take 500 mg by mouth 2 (two) times daily as needed (outbreak.).    Historical Provider, MD    Family History Family History  Problem Relation Age of Onset  . Depression Mother   . Diabetes Mother   . Hypertension Mother   . Asthma Sister   . Asthma Daughter   . Hypertension Maternal Grandmother     Social History Social History  Substance Use Topics  . Smoking status: Former Smoker    Packs/day: 0.50    Types: Cigarettes  . Smokeless tobacco: Never Used  . Alcohol use No     Comment: occ     Allergies   Review of patient's allergies indicates no known allergies.   Review of Systems Review of Systems    Constitutional: Negative for chills and fever.  Respiratory: Positive for shortness of breath.   Cardiovascular: Positive for chest pain.  Gastrointestinal: Negative for nausea and vomiting.  Genitourinary: Negative for difficulty urinating, dysuria, frequency, hematuria and urgency.  Musculoskeletal: Positive for myalgias.  Skin: Positive for color change and rash.   Physical Exam Updated Vital Signs BP 131/68 (BP Location: Right Arm)   Pulse 86   Temp 98.4 F (36.9 C) (Oral)   Resp 16   LMP 02/24/2016 (Approximate)   SpO2 100%   Physical Exam  Constitutional: She appears well-developed and well-nourished. No distress.  HENT:  Head: Normocephalic and atraumatic.  Mouth/Throat: Oropharynx is clear and moist. No oropharyngeal exudate.  Eyes: Conjunctivae are normal. Pupils are equal, round, and reactive to light. Right eye exhibits no discharge. Left eye exhibits no discharge. No scleral icterus.  Neck: Normal range of motion. Neck supple. No thyromegaly present.  Cardiovascular: Normal rate, regular rhythm, normal heart sounds and intact distal pulses.  Exam reveals no gallop and no friction rub.   No murmur heard. Pulmonary/Chest: Effort normal and breath sounds normal. No stridor. No respiratory distress. She has no wheezes. She has no rales.  Abdominal: Soft. Bowel sounds are normal. She exhibits no distension. There is no tenderness. There is no rebound and no guarding.  Musculoskeletal: She exhibits no edema.  Lymphadenopathy:    She has no cervical adenopathy.  Neurological: She is alert. Coordination normal.  Skin: Skin is warm and dry. No rash noted. She is not diaphoretic. No pallor.  1 area fluctuance to left axilla measuring around 2 x 3 cm; areas of induration and small areas of drainage in bilateral axilla; see images  Psychiatric: She has a normal mood and affect.  Nursing note and vitals reviewed.        ED Treatments / Results  Labs (all labs ordered  are listed, but only abnormal results are displayed) Labs Reviewed  CBC WITH DIFFERENTIAL/PLATELET - Abnormal; Notable for the following:       Result Value   WBC 10.7 (*)    All other components within normal limits  I-STAT TROPOININ, ED    EKG  EKG Interpretation  Date/Time:  Wednesday March 23 2016 18:03:26 EDT Ventricular Rate:  69 PR Interval:  132 QRS Duration: 88 QT Interval:  372 QTC Calculation: 398 R Axis:   76 Text Interpretation:  Normal sinus rhythm Normal ECG Normal ECG Confirmed by Gerhard Munch  MD ([id number]) on 03/23/2016 6:20:29 PM       Radiology No results found.  Procedures .Marland KitchenIncision and Drainage Date/Time: 03/23/2016 6:35 PM Performed by: Emi Holes Authorized by: Emi Holes  Consent:    Consent obtained:  Verbal   Consent given by:  Patient   Risks discussed:  Pain Location:    Type:  Abscess   Location: L axilla. Anesthesia (see MAR for exact dosages):    Anesthesia method:  Local infiltration   Local anesthetic:  Lidocaine 1% w/o epi Procedure type:    Complexity:  Simple Procedure details:    Scalpel blade:  11   Drainage:  Purulent and bloody   Drainage amount:  Copious   Wound treatment:  Wound left open   Packing materials:  None Post-procedure details:    Patient tolerance of procedure:  Tolerated well, no immediate complications     DIAGNOSTIC STUDIES: Oxygen Saturation is 100% on RA, normal by my interpretation.    COORDINATION OF CARE: 5:47 PM Discussed next steps with pt. Pt verbalized understanding and is agreeable with the plan.    Medications Ordered in ED Medications  oxyCODONE-acetaminophen (PERCOCET/ROXICET) 5-325 MG per tablet 1 tablet (1 tablet Oral Given 03/23/16 1453)  oxyCODONE-acetaminophen (PERCOCET/ROXICET) 5-325 MG per tablet 2 tablet (2 tablets Oral Given 03/23/16 1811)  lidocaine (PF) (XYLOCAINE) 1 % injection 30 mL (30 mLs Infiltration Given 03/23/16 1815)  clindamycin (CLEOCIN)  capsule 300 mg (300 mg Oral Given 03/23/16 2017)     Initial Impression / Assessment and Plan / ED Course  I have reviewed the triage vital signs and the nursing notes.  Pertinent labs & imaging results that were available during my care of the patient were reviewed by me and considered in my medical decision making (see chart for details).  Clinical Course   Patient controlled the ED with Percocet.  Patient with skin abscess. Incision and drainage performed in the ED today. Wound recheck in 2 days. Supportive care and return precautions discussed.  Pt sent home with clindamycin and Percocet for pain control. Patient advised to also by probiotics as to prevent stomach upset and C. difficile. Patient complaining of chest pain and shortness of breath initially. EKG shows NSR. Troponin 0.0. Chest pain and shortness of breath resolved after patient's pain was controlled and after the procedure when patient was calmer. Patient advised to follow up and establish care with PCP. Patient understands and agrees with plan. I discussed patient case with Dr. Jeraldine Loots who guided patient's management and agrees with plan. Patient vitals stable throughout ED course and discharged in satisfactory condition.    I personally performed the services described in this documentation, which was scribed in my presence. The recorded information has been reviewed and is accurate.  Final Clinical Impressions(s) / ED Diagnoses   Final diagnoses:  Hydradenitis  Abscess of left axilla    New Prescriptions Discharge Medication List as of 03/23/2016  8:07 PM    START taking these medications   Details  clindamycin (CLEOCIN) 300 MG capsule Take 1 capsule (300 mg total) by mouth 2 (two) times daily., Starting Wed 03/23/2016, Print         Emi Holes, PA-C 03/23/16 2211    Gerhard Munch, MD 03/23/16 2252

## 2016-03-23 NOTE — Discharge Instructions (Signed)
Medications: Clindamycin, Percocet  Treatment: Take clindamycin twice daily as prescribed for 1 week. Take 1-2 Percocet every 4-6 hours as needed for severe pain. Do not drive or operate machinery when taking this medication and only take as prescribed. Keep dressing applied for 24 hours. At this time tomorrow, wash with warm soapy water and apply clean dressing.  Follow-up: Please return to emergency department in 2 days for wound recheck. Please return sooner if you develop any fever, increasing pain, redness, swelling, streaking from the area. Please follow-up and establish care with a primary care provider by calling the resources provided to you by the case manager.

## 2016-03-23 NOTE — Progress Notes (Addendum)
Pt c/o chest pain with movement. Pt denies any pain when taking a deep breath in. Pt was talking on the phone and does not appear in distress.left armpit I and D'd. Pt tolerated but stated she did not think she could have the other side done. Exudate serrous with think yellow and a foul odor. (6:45pm)Report to oncoming shift. (7:10pm)Pt given warm blankets and a pillow and asked if she could rest for a few minutes.

## 2016-03-23 NOTE — ED Triage Notes (Signed)
She c/o "bad abscesses under both my arms". She states hx of hydradenitis with multiple drainage of same. She denies fever and is in no distress.

## 2016-03-23 NOTE — Progress Notes (Signed)
EDCM spoke to patient at bedside. Patient confirms she does not have a pcp or insurance living in [place].  EDCM provided patient with contact information to [facility], informed patient of services there and walk in times.  EDCM also provided patient with list of pcps who accept self pay patients, list of discount pharmacies and websites needymeds.org and GoodRX.com for medication assistance, phone number to inquire about the orange card, phone number to inquire about Medicaid, phone number to inquire about the Affordable Care Act, financial resources in the community such as local churches, [employer], urban ministries, and dental assistance for uninsured patients.  Patient thankful for resources.  No further EDCM needs at this time. 

## 2016-05-29 ENCOUNTER — Inpatient Hospital Stay (HOSPITAL_COMMUNITY)
Admission: AD | Admit: 2016-05-29 | Discharge: 2016-05-29 | Disposition: A | Discharge status: Home / Self Care | Payer: Medicaid Other | Source: Ambulatory Visit | Attending: Obstetrics and Gynecology | Admitting: Obstetrics and Gynecology

## 2016-05-29 ENCOUNTER — Encounter (HOSPITAL_COMMUNITY): Payer: Self-pay | Admitting: *Deleted

## 2016-05-29 DIAGNOSIS — R109 Unspecified abdominal pain: Secondary | ICD-10-CM | POA: Insufficient documentation

## 2016-05-29 DIAGNOSIS — Z87442 Personal history of urinary calculi: Secondary | ICD-10-CM | POA: Insufficient documentation

## 2016-05-29 DIAGNOSIS — R102 Pelvic and perineal pain: Secondary | ICD-10-CM

## 2016-05-29 DIAGNOSIS — Z833 Family history of diabetes mellitus: Secondary | ICD-10-CM | POA: Diagnosis not present

## 2016-05-29 DIAGNOSIS — Z825 Family history of asthma and other chronic lower respiratory diseases: Secondary | ICD-10-CM | POA: Insufficient documentation

## 2016-05-29 DIAGNOSIS — Z8249 Family history of ischemic heart disease and other diseases of the circulatory system: Secondary | ICD-10-CM | POA: Diagnosis not present

## 2016-05-29 DIAGNOSIS — O3481 Maternal care for other abnormalities of pelvic organs, first trimester: Secondary | ICD-10-CM | POA: Diagnosis not present

## 2016-05-29 DIAGNOSIS — Z79899 Other long term (current) drug therapy: Secondary | ICD-10-CM | POA: Diagnosis not present

## 2016-05-29 DIAGNOSIS — O26891 Other specified pregnancy related conditions, first trimester: Secondary | ICD-10-CM | POA: Insufficient documentation

## 2016-05-29 DIAGNOSIS — O26899 Other specified pregnancy related conditions, unspecified trimester: Secondary | ICD-10-CM | POA: Diagnosis not present

## 2016-05-29 DIAGNOSIS — O99341 Other mental disorders complicating pregnancy, first trimester: Secondary | ICD-10-CM | POA: Diagnosis not present

## 2016-05-29 DIAGNOSIS — Z3A12 12 weeks gestation of pregnancy: Secondary | ICD-10-CM | POA: Diagnosis present

## 2016-05-29 DIAGNOSIS — Z87891 Personal history of nicotine dependence: Secondary | ICD-10-CM | POA: Insufficient documentation

## 2016-05-29 HISTORY — DX: Calculus of kidney: N20.0

## 2016-05-29 HISTORY — DX: Unspecified infectious disease: B99.9

## 2016-05-29 HISTORY — DX: Dermatitis, unspecified: L30.9

## 2016-05-29 HISTORY — DX: Unspecified ovarian cyst, unspecified side: N83.209

## 2016-05-29 HISTORY — DX: Female pelvic inflammatory disease, unspecified: N73.9

## 2016-05-29 LAB — URINALYSIS, ROUTINE W REFLEX MICROSCOPIC
BILIRUBIN URINE: NEGATIVE
GLUCOSE, UA: NEGATIVE mg/dL
Hgb urine dipstick: NEGATIVE
KETONES UR: NEGATIVE mg/dL
Leukocytes, UA: NEGATIVE
Nitrite: NEGATIVE
Protein, ur: NEGATIVE mg/dL
Specific Gravity, Urine: 1.02 (ref 1.005–1.030)
pH: 6 (ref 5.0–8.0)

## 2016-05-29 LAB — WET PREP, GENITAL
CLUE CELLS WET PREP: NONE SEEN
Sperm: NONE SEEN
Trich, Wet Prep: NONE SEEN
Yeast Wet Prep HPF POC: NONE SEEN

## 2016-05-29 MED ORDER — VALACYCLOVIR HCL 500 MG PO TABS: 500.0000 mg | ORAL_TABLET | Freq: Two times a day (BID) | ORAL | 2 refills | Status: DC | PRN

## 2016-05-29 NOTE — Discharge Instructions (Signed)
Round Ligament Pain Introduction The round ligament is a cord of muscle and tissue that helps to support the uterus. It can become a source of pain during pregnancy if it becomes stretched or twisted as the baby grows. The pain usually begins in the second trimester of pregnancy, and it can come and go until the baby is delivered. It is not a serious problem, and it does not cause harm to the baby. Round ligament pain is usually a short, sharp, and pinching pain, but it can also be a dull, lingering, and aching pain. The pain is felt in the lower side of the abdomen or in the groin. It usually starts deep in the groin and moves up to the outside of the hip area. Pain can occur with:  A sudden change in position.  Rolling over in bed.  Coughing or sneezing.  Physical activity. Follow these instructions at home: Watch your condition for any changes. Take these steps to help with your pain:  When the pain starts, relax. Then try:  Sitting down.  Flexing your knees up to your abdomen.  Lying on your side with one pillow under your abdomen and another pillow between your legs.  Sitting in a warm bath for 15-20 minutes or until the pain goes away.  Take over-the-counter and prescription medicines only as told by your health care provider.  Move slowly when you sit and stand.  Avoid long walks if they cause pain.  Stop or lessen your physical activities if they cause pain. Contact a health care provider if:  Your pain does not go away with treatment.  You feel pain in your back that you did not have before.  Your medicine is not helping. Get help right away if:  You develop a fever or chills.  You develop uterine contractions.  You develop vaginal bleeding.  You develop nausea or vomiting.  You develop diarrhea.  You have pain when you urinate. This information is not intended to replace advice given to you by your health care provider. Make sure you discuss any questions  you have with your health care provider. Document Released: 03/08/2008 Document Revised: 11/05/2015 Document Reviewed: 08/06/2014  2017 [employer]vier  

## 2016-05-29 NOTE — MAU Provider Note (Signed)
History   Patient Diamond Burton is a 30 year old G3P2002 at 12 weeks and 4 days here with complaints of suprapubic pain since 7 am this morning.   CSN: [id number]  Arrival date and time: 05/29/16 [id number]   First Provider Initiated Contact with Patient 05/29/16 [phone]      Chief Complaint  Patient presents with  . Abdominal Pain   Abdominal Pain  This is a new problem. The current episode started today. The onset quality is sudden. The problem has been unchanged. The pain is located in the suprapubic region. The pain is at a severity of 8/10. The quality of the pain is sharp. The abdominal pain does not radiate. Associated symptoms include constipation. Pertinent negatives include no anorexia, arthralgias, belching, diarrhea, dysuria, fever, flatus, frequency, headaches, hematochezia, hematuria, melena, myalgias, nausea, vomiting or weight loss. The pain is aggravated by certain positions. Relieved by: sitting still. She has tried nothing for the symptoms. The treatment provided no relief.    OB History    Gravida Para Term Preterm AB Living   3 2 2     2    SAB TAB Ectopic Multiple Live Births           2      Past Medical History:  Diagnosis Date  . Abnormal Pap smear   . Depression    while pregnant was on zoloft but now counseling  . Eczema   . Erb's palsy    L arm  . Erb's palsy    left arm  . Herpes    frequent outbreaks on valtrex 500  . History of chlamydia   . History of gonorrhea   . Hydradenitis   . Infection    UTI  . Kidney stones   . Ovarian cyst   . Periodontal disease   . PID (pelvic inflammatory disease)     Past Surgical History:  Procedure Laterality Date  . NO PAST SURGERIES      Family History  Problem Relation Age of Onset  . Depression Mother   . Diabetes Mother   . Hypertension Mother   . Heart disease Mother   . Asthma Sister   . Asthma Daughter   . Hypertension Maternal Grandmother   . Diabetes Maternal Grandmother     Social  History  Substance Use Topics  . Smoking status: Former Smoker    Packs/day: 0.50    Types: Cigarettes  . Smokeless tobacco: Never Used  . Alcohol use No     Comment: occ    Allergies: No Known Allergies  Prescriptions Prior to Admission  Medication Sig Dispense Refill Last Dose  . cephALEXin (KEFLEX) 500 MG capsule Take 1 capsule (500 mg total) by mouth 4 (four) times daily. (Patient not taking: Reported on 07/19/2015) 40 capsule 0 Not Taking at Unknown time  . clindamycin (CLEOCIN) 300 MG capsule Take 1 capsule (300 mg total) by mouth 2 (two) times daily. 14 capsule 0   . doxycycline (VIBRAMYCIN) 100 MG capsule Take 1 capsule (100 mg total) by mouth 2 (two) times daily. 20 capsule 0   . HYDROcodone-acetaminophen (NORCO/VICODIN) 5-325 MG tablet Take 1-2 tablets by mouth every 6 (six) hours as needed. 12 tablet 0   . ibuprofen (ADVIL,MOTRIN) 200 MG tablet Take 800 mg by mouth every 6 (six) hours as needed for headache or moderate pain.    07/18/2015 at Unknown time  . metroNIDAZOLE (FLAGYL) 500 MG tablet Take 1 tablet (500 mg total) by mouth 2 (  two) times daily. 14 tablet 0   . naproxen sodium (ALEVE) 220 MG tablet Take 660 mg by mouth 2 (two) times daily as needed (pain).   07/19/2015 at 800  . oxyCODONE-acetaminophen (PERCOCET/ROXICET) 5-325 MG tablet Take 1-2 tablets by mouth every 4 (four) hours as needed for severe pain. 15 tablet 0   . traMADol (ULTRAM) 50 MG tablet Take 1 tablet (50 mg total) by mouth every 6 (six) hours as needed. 15 tablet 0   . valACYclovir (VALTREX) 500 MG tablet Take 500 mg by mouth 2 (two) times daily as needed (outbreak.).   2 years ago    Review of Systems  Constitutional: Negative for fever and weight loss.  HENT: Negative.   Eyes: Negative.   Respiratory: Negative.   Cardiovascular: Negative.   Gastrointestinal: Positive for abdominal pain and constipation. Negative for anorexia, diarrhea, flatus, hematochezia, melena, nausea and vomiting.  Genitourinary:  Negative for dysuria, frequency and hematuria.  Musculoskeletal: Negative for arthralgias and myalgias.  Skin: Negative.   Neurological: Negative for headaches.  Psychiatric/Behavioral: Negative.    Physical Exam   Blood pressure 116/66, pulse 89, temperature 98.9 F (37.2 C), temperature source Oral, resp. rate 18, last menstrual period 02/24/2016.  Physical Exam  Constitutional: She is oriented to person, place, and time. She appears well-developed and well-nourished.  HENT:  Head: Normocephalic.  Neck: Normal range of motion.  Respiratory: Effort normal. No respiratory distress. She has no wheezes. She has no rales. She exhibits no tenderness.  GI: Soft. Bowel sounds are normal. She exhibits no distension and no mass. There is no tenderness. There is no rebound and no guarding.  Genitourinary: No vaginal discharge found.  Genitourinary Comments: One small herpetic lesion present on left labia. External labia and vaginal normal. Cervix is pink and nonerythematous. No CMT.   Musculoskeletal: Normal range of motion.  Neurological: She is alert and oriented to person, place, and time.  Skin: Skin is warm and dry.  Psychiatric: She has a normal mood and affect.    MAU Course  Procedures  MDM -wet prep -Gc CT  Assessment and Plan  -Round ligament pain  -Wet prep normal, GC CT pending.  -UA normal  -Patient to be discharged home with instructions for maintaining a health pregnancy, belly band, tylenol and heating pad for pain. Instructed to return to MAU if pain increases or bleeding begins.   -Rx for Valtrex to be sent  Charlesetta GaribaldiKathryn Lorraine Mansoor Hillyard CNM 05/29/2016, 8:57 AM

## 2016-05-29 NOTE — MAU Note (Addendum)
Pain in lower abd started this morning.woke her during the night, pressure in LLQ/groin area.  Feels like inflammation.  Denies any bleeding.

## 2016-05-30 LAB — GC/CHLAMYDIA PROBE AMP (~~LOC~~) NOT AT ARMC
CHLAMYDIA, DNA PROBE: NEGATIVE
Neisseria Gonorrhea: NEGATIVE

## 2016-06-01 ENCOUNTER — Ambulatory Visit (INDEPENDENT_AMBULATORY_CARE_PROVIDER_SITE_OTHER): Payer: Medicaid Other | Admitting: Obstetrics & Gynecology

## 2016-06-01 ENCOUNTER — Encounter: Payer: Self-pay | Admitting: Obstetrics & Gynecology

## 2016-06-01 ENCOUNTER — Other Ambulatory Visit (HOSPITAL_COMMUNITY)
Admission: RE | Admit: 2016-06-01 | Discharge: 2016-06-01 | Disposition: A | Discharge status: Home / Self Care | Payer: Medicaid Other | Source: Ambulatory Visit | Attending: Obstetrics & Gynecology | Admitting: Obstetrics & Gynecology

## 2016-06-01 DIAGNOSIS — Z01419 Encounter for gynecological examination (general) (routine) without abnormal findings: Secondary | ICD-10-CM | POA: Diagnosis present

## 2016-06-01 DIAGNOSIS — Z3481 Encounter for supervision of other normal pregnancy, first trimester: Secondary | ICD-10-CM

## 2016-06-01 DIAGNOSIS — O98119 Syphilis complicating pregnancy, unspecified trimester: Secondary | ICD-10-CM

## 2016-06-01 DIAGNOSIS — Z8619 Personal history of other infectious and parasitic diseases: Secondary | ICD-10-CM | POA: Insufficient documentation

## 2016-06-01 DIAGNOSIS — Z1151 Encounter for screening for human papillomavirus (HPV): Secondary | ICD-10-CM | POA: Diagnosis present

## 2016-06-01 DIAGNOSIS — O0990 Supervision of high risk pregnancy, unspecified, unspecified trimester: Secondary | ICD-10-CM

## 2016-06-01 DIAGNOSIS — O98111 Syphilis complicating pregnancy, first trimester: Secondary | ICD-10-CM

## 2016-06-01 DIAGNOSIS — O09899 Supervision of other high risk pregnancies, unspecified trimester: Secondary | ICD-10-CM | POA: Insufficient documentation

## 2016-06-01 NOTE — Patient Instructions (Signed)
Second Trimester of Pregnancy The second trimester is from week 13 through week 28 (months 4 through 6). The second trimester is often a time when you feel your best. Your body has also adjusted to being pregnant, and you begin to feel better physically. Usually, morning sickness has lessened or quit completely, you may have more energy, and you may have an increase in appetite. The second trimester is also a time when the fetus is growing rapidly. At the end of the sixth month, the fetus is about 9 inches long and weighs about 1 pounds. You will likely begin to feel the baby move (quickening) between 18 and 20 weeks of the pregnancy. Body changes during your second trimester Your body continues to go through many changes during your second trimester. The changes vary from woman to woman.  Your weight will continue to increase. You will notice your lower abdomen bulging out.  You may begin to get stretch marks on your hips, abdomen, and breasts.  You may develop headaches that can be relieved by medicines. The medicines should be approved by your health care provider.  You may urinate more often because the fetus is pressing on your bladder.  You may develop or continue to have heartburn as a result of your pregnancy.  You may develop constipation because certain hormones are causing the muscles that push waste through your intestines to slow down.  You may develop hemorrhoids or swollen, bulging veins (varicose veins).  You may have back pain. This is caused by:  Weight gain.  Pregnancy hormones that are relaxing the joints in your pelvis.  A shift in weight and the muscles that support your balance.  Your breasts will continue to grow and they will continue to become tender.  Your gums may bleed and may be sensitive to brushing and flossing.  Dark spots or blotches (chloasma, mask of pregnancy) may develop on your face. This will likely fade after the baby is born.  A dark line  from your belly button to the pubic area (linea nigra) may appear. This will likely fade after the baby is born.  You may have changes in your hair. These can include thickening of your hair, rapid growth, and changes in texture. Some women also have hair loss during or after pregnancy, or hair that feels dry or thin. Your hair will most likely return to normal after your baby is born. What to expect at prenatal visits During a routine prenatal visit:  You will be weighed to make sure you and the fetus are growing normally.  Your blood pressure will be taken.  Your abdomen will be measured to track your baby's growth.  The fetal heartbeat will be listened to.  Any test results from the previous visit will be discussed. Your health care provider may ask you:  How you are feeling.  If you are feeling the baby move.  If you have had any abnormal symptoms, such as leaking fluid, bleeding, severe headaches, or abdominal cramping.  If you are using any tobacco products, including cigarettes, chewing tobacco, and electronic cigarettes.  If you have any questions. Other tests that may be performed during your second trimester include:  Blood tests that check for:  Low iron levels (anemia).  Gestational diabetes (between 24 and 28 weeks).  Rh antibodies. This is to check for a protein on red blood cells (Rh factor).  Urine tests to check for infections, diabetes, or protein in the urine.  An ultrasound to   confirm the proper growth and development of the baby.  An amniocentesis to check for possible genetic problems.  Fetal screens for spina bifida and Down syndrome.  HIV (human immunodeficiency virus) testing. Routine prenatal testing includes screening for HIV, unless you choose not to have this test. Follow these instructions at home: Eating and drinking  Continue to eat regular, healthy meals.  Avoid raw meat, uncooked cheese, cat litter boxes, and soil used by cats. These  carry germs that can cause birth defects in the baby.  Take your prenatal vitamins.  Take 1500-2000 mg of calcium daily starting at the 20th week of pregnancy until you deliver your baby.  If you develop constipation:  Take over-the-counter or prescription medicines.  Drink enough fluid to keep your urine clear or pale yellow.  Eat foods that are high in fiber, such as fresh fruits and vegetables, whole grains, and beans.  Limit foods that are high in fat and processed sugars, such as fried and sweet foods. Activity  Exercise only as directed by your health care provider. Experiencing uterine cramps is a good sign to stop exercising.  Avoid heavy lifting, wear low heel shoes, and practice good posture.  Wear your seat belt at all times when driving.  Rest with your legs elevated if you have leg cramps or low back pain.  Wear a good support bra for breast tenderness.  Do not use hot tubs, steam rooms, or saunas. Lifestyle  Avoid all smoking, herbs, alcohol, and unprescribed drugs. These chemicals affect the formation and growth of the baby.  Do not use any products that contain nicotine or tobacco, such as cigarettes and e-cigarettes. If you need help quitting, ask your health care provider.  A sexual relationship may be continued unless your health care provider directs you otherwise. General instructions  Follow your health care provider's instructions regarding medicine use. There are medicines that are either safe or unsafe to take during pregnancy.  Take warm sitz baths to soothe any pain or discomfort caused by hemorrhoids. Use hemorrhoid cream if your health care provider approves.  If you develop varicose veins, wear support hose. Elevate your feet for 15 minutes, 3-4 times a day. Limit salt in your diet.  Visit your dentist if you have not gone yet during your pregnancy. Use a soft toothbrush to brush your teeth and be gentle when you floss.  Keep all follow-up  prenatal visits as told by your health care provider. This is important. Contact a health care provider if:  You have dizziness.  You have mild pelvic cramps, pelvic pressure, or nagging pain in the abdominal area.  You have persistent nausea, vomiting, or diarrhea.  You have a bad smelling vaginal discharge.  You have pain with urination. Get help right away if:  You have a fever.  You are leaking fluid from your vagina.  You have spotting or bleeding from your vagina.  You have severe abdominal cramping or pain.  You have rapid weight gain or weight loss.  You have shortness of breath with chest pain.  You notice sudden or extreme swelling of your face, hands, ankles, feet, or legs.  You have not felt your baby move in over an hour.  You have severe headaches that do not go away with medicine.  You have vision changes. Summary  The second trimester is from week 13 through week 28 (months 4 through 6). It is also a time when the fetus is growing rapidly.  Your body goes   through many changes during pregnancy. The changes vary from woman to woman.  Avoid all smoking, herbs, alcohol, and unprescribed drugs. These chemicals affect the formation and growth your baby.  Do not use any tobacco products, such as cigarettes, chewing tobacco, and e-cigarettes. If you need help quitting, ask your health care provider.  Contact your health care provider if you have any questions. Keep all prenatal visits as told by your health care provider. This is important. This information is not intended to replace advice given to you by your health care provider. Make sure you discuss any questions you have with your health care provider. Document Released: 05/24/2001 Document Revised: 11/05/2015 Document Reviewed: 07/31/2012 Elsevier Interactive Patient Education  2017 [employer]  

## 2016-06-01 NOTE — Progress Notes (Signed)
Patient has unsure dating. She also has questions about her syphilis diagnosis.

## 2016-06-01 NOTE — Progress Notes (Signed)
Subjective:    Diamond Burton is a G3P2002 2163w0d being seen today for her first obstetrical visit.  Her obstetrical history is significant for syphilis treated in october. Patient does not intend to breast feed. Pregnancy history fully reviewed.  Patient reports no complaints.  Vitals:   06/01/16 1111  BP: 112/71  Pulse: 88  Weight: 219 lb 6.4 oz (99.5 kg)    HISTORY: OB History  Gravida Para Term Preterm AB Living  3 2 2     2   SAB TAB Ectopic Multiple Live Births          2    # Outcome Date GA Lbr Len/2nd Weight Sex Delivery Anes PTL Lv  3 Current           2 Term 06/28/11 2730w4d 12:32 / 00:21 7 lb 1.1 oz (3.205 kg) F Vag-Spont EPI  LIV     Birth Comments: WNL  1 Term 2005 8063w0d  6 lb 8 oz (2.948 kg) F    LIV     Past Medical History:  Diagnosis Date  . Abnormal Pap smear   . Depression    while pregnant was on zoloft but now counseling  . Eczema   . Erb's palsy    L arm  . Erb's palsy    left arm  . Herpes    frequent outbreaks on valtrex 500  . History of chlamydia   . History of gonorrhea   . Hydradenitis   . Infection    UTI  . Kidney stones   . Ovarian cyst   . Periodontal disease   . PID (pelvic inflammatory disease)    Past Surgical History:  Procedure Laterality Date  . NO PAST SURGERIES     Family History  Problem Relation Age of Onset  . Depression Mother   . Diabetes Mother   . Hypertension Mother   . Heart disease Mother   . Asthma Sister   . Asthma Daughter   . Hypertension Maternal Grandmother   . Diabetes Maternal Grandmother      Exam    Uterus:     Pelvic Exam:    Perineum: No Hemorrhoids   Vulva: normal   Vagina:  normal mucosa   pH:     Cervix: no lesions   Adnexa: normal adnexa   Bony Pelvis: average  System: Breast:  normal appearance, no masses or tenderness   Skin: normal coloration and turgor, no rashes    Neurologic: oriented, normal mood   Extremities: normal strength, tone, and muscle mass   HEENT  thyroid without masses   Mouth/Teeth mucous membranes moist, pharynx normal without lesions and dental hygiene good   Neck supple   Cardiovascular: regular rate and rhythm   Respiratory:  appears well, vitals normal, no respiratory distress, acyanotic, normal RR   Abdomen: soft, non-tender; bowel sounds normal; no masses,  no organomegaly   Urinary: urethral meatus normal      Assessment:    Pregnancy: [id number] Patient Active Problem List   Diagnosis Date Noted  . Supervision of high risk pregnancy, antepartum 06/01/2016  . History of herpes genitalis 06/01/2016  . Syphilis affecting pregnancy, antepartum 06/01/2016  . Hydradenitis         Plan:     Initial labs drawn. Prenatal vitamins. Problem list reviewed and updated. Genetic Screening discussed Quad Screen: 15+ week.  Ultrasound discussed; fetal survfollow syphilis tiey: 18+ weeks.  Follow up in 4 weeks. 50% of 30 min visit spent on  counseling and coordination of care.  Follow syphilis titers   Scheryl DarterJames Arnold 06/01/2016

## 2016-06-03 LAB — PRENATAL PROFILE I(LABCORP)
Antibody Screen: NEGATIVE
BASOS ABS: 0.1 10*3/uL (ref 0.0–0.2)
Basos: 1 %
EOS (ABSOLUTE): 0.3 10*3/uL (ref 0.0–0.4)
EOS: 4 %
HEP B S AG: NEGATIVE
Hematocrit: 40.4 % (ref 34.0–46.6)
Hemoglobin: 13 g/dL (ref 11.1–15.9)
IMMATURE GRANS (ABS): 0 10*3/uL (ref 0.0–0.1)
IMMATURE GRANULOCYTES: 0 %
LYMPHS ABS: 2.7 10*3/uL (ref 0.7–3.1)
LYMPHS: 35 %
MCH: 29.3 pg (ref 26.6–33.0)
MCHC: 32.2 g/dL (ref 31.5–35.7)
MCV: 91 fL (ref 79–97)
MONOCYTES: 6 %
Monocytes Absolute: 0.5 10*3/uL (ref 0.1–0.9)
NEUTROS PCT: 54 %
Neutrophils Absolute: 4.3 10*3/uL (ref 1.4–7.0)
PLATELETS: 314 10*3/uL (ref 150–379)
RBC: 4.44 x10E6/uL (ref 3.77–5.28)
RDW: 14 % (ref 12.3–15.4)
RH TYPE: NEGATIVE
RPR: REACTIVE — AB
RUBELLA: 2.3 {index} (ref >0.99)
WBC: 7.7 10*3/uL (ref 3.4–10.8)

## 2016-06-03 LAB — CYTOLOGY - PAP
DIAGNOSIS: NEGATIVE
HPV (WINDOPATH): NOT DETECTED

## 2016-06-03 LAB — URINE CULTURE

## 2016-06-03 LAB — HEMOGLOBINOPATHY EVALUATION
HEMOGLOBIN A2 QUANTITATION: 2.7 % (ref 1.8–3.2)
HGB C: 0 %
HGB S: 0 %
HGB VARIANT: 0 %
Hemoglobin F Quantitation: 0 % (ref 0.0–2.0)
Hgb A: 97.3 % (ref 96.4–98.8)

## 2016-06-03 LAB — HIV ANTIBODY (ROUTINE TESTING W REFLEX): HIV SCREEN 4TH GENERATION: NONREACTIVE

## 2016-06-03 LAB — RPR, QUANT+TP ABS (REFLEX)
Rapid Plasma Reagin, Quant: 1:1 {titer} — ABNORMAL HIGH
T Pallidum Abs: POSITIVE — AB

## 2016-06-09 LAB — TOXASSURE SELECT 13 (MW), URINE

## 2016-06-13 NOTE — L&D Delivery Note (Signed)
Indication for operative vaginal delivery: Persistent fetal bradycardia/prolonged decel  Risks of vacuum assistance were discussed in detail, including but not limited to, bleeding, infection, damage to maternal tissues, fetal cephalohematoma, inability to effect vaginal delivery of the head or shoulder dystocia that cannot be resolved by established maneuvers and need for emergency cesarean section.  Patient gave verbal consent.  Patient was examined and found to be fully dilated with fetal station of +2. The soft vacuum soft cup was positioned over the sagittal suture 3 cm anterior to posterior fontanelle.  Pressure was then increased to 500 mmHg, and the patient was instructed to push.  Pulling was administered along the pelvic curve.  1 pull was administered during 1 push, NO popoffs.  The infant was then delivered atraumatically   31 y.o. G3P2002 at 4614w6d delivered via vacuum assistance a viable female infant in cephalic, LOA position. Loose nuchal cord x3, easily reduced at perineum. Right anterior shoulder delivered with ease. Thick meconium fluid followed delivery of baby. 60 sec delayed cord clamping. Cord clamped x2 and cut. Placenta delivered spontaneously intact, with 3VC. Fundus firm on exam with massage and pitocin. Good hemostasis noted.  Anesthesia: Epidural Laceration: Shallow 2nd degree perineal Suture: 3-0 Vicryl Good hemostasis noted. EBL: 100 cc  Mom and baby recovering in LDR.    Apgars: APGAR (1 MIN): 8   APGAR (5 MINS): 9     Weight: Pending skin to skin  Sponge and instrument count were correct x2. Placenta sent to pathology.  Dr. Shawnie PonsPratt was present for entire vacuum delivery.  Jen MowElizabeth Mumaw, DO [facility] Fellow [facility] 11/29/2016, 5:06 AM .

## 2016-07-05 ENCOUNTER — Ambulatory Visit (INDEPENDENT_AMBULATORY_CARE_PROVIDER_SITE_OTHER): Payer: Medicaid Other | Admitting: Certified Nurse Midwife

## 2016-07-05 ENCOUNTER — Other Ambulatory Visit (HOSPITAL_COMMUNITY)
Admission: RE | Admit: 2016-07-05 | Discharge: 2016-07-05 | Disposition: A | Discharge status: Home / Self Care | Payer: Medicaid Other | Source: Ambulatory Visit | Attending: Certified Nurse Midwife | Admitting: Certified Nurse Midwife

## 2016-07-05 VITALS — BP 125/73 | HR 93 | Temp 99.1°F | Wt 218.6 lb

## 2016-07-05 DIAGNOSIS — O0990 Supervision of high risk pregnancy, unspecified, unspecified trimester: Secondary | ICD-10-CM

## 2016-07-05 DIAGNOSIS — O98119 Syphilis complicating pregnancy, unspecified trimester: Secondary | ICD-10-CM

## 2016-07-05 DIAGNOSIS — Z8619 Personal history of other infectious and parasitic diseases: Secondary | ICD-10-CM

## 2016-07-05 DIAGNOSIS — O98112 Syphilis complicating pregnancy, second trimester: Secondary | ICD-10-CM

## 2016-07-05 NOTE — Progress Notes (Signed)
   PRENATAL VISIT NOTE  Subjective:  Diamond Burton is a 31 y.o. G3P2002 at 5281w6d being seen today for ongoing prenatal care.  She is currently monitored for the following issues for this high-risk pregnancy and has Hydradenitis; Supervision of high risk pregnancy, antepartum; History of herpes genitalis; and Syphilis affecting pregnancy, antepartum on her problem list.  Patient reports no bleeding, no contractions, no cramping, no leaking and rectal pain ?discharge, and constipation.  OTC miralax and colace..  Contractions: Not present. Vag. Bleeding: None.   . Denies leaking of fluid.   The following portions of the patient's history were reviewed and updated as appropriate: allergies, current medications, past family history, past medical history, past social history, past surgical history and problem list. Problem list updated.  Objective:   Vitals:   07/05/16 1057  BP: 125/73  Pulse: 93  Temp: 99.1 F (37.3 C)  Weight: 218 lb 9.6 oz (99.2 kg)    Fetal Status: Fetal Heart Rate (bpm): 142 Fundal Height: 18 cm       General:  Alert, oriented and cooperative. Patient is in no acute distress.  Skin: Skin is warm and dry. No rash noted.   Cardiovascular: Normal heart rate noted  Respiratory: Normal respiratory effort, no problems with respiration noted  Abdomen: Soft, gravid, appropriate for gestational age. Pain/Pressure: Present     Pelvic:  Cervical exam deferred        Extremities: Normal range of motion.  Edema: None  Mental Status: Normal mood and affect. Normal behavior. Normal judgment and thought content.   Rectal: no external hemorrhoids noted, sphincter intact.    Assessment and Plan:  Pregnancy: G3P2002 at 4281w6d  1. Syphilis affecting pregnancy, antepartum     Every trimester RPR titer.  Last titer: 1:1.    2. Supervision of high risk pregnancy, antepartum      ?vaginal discharge versus rectal - Cervicovaginal ancillary only - US MFM OB DETAIL +14 WK; Future -  MaterniT21 PLUS Core+SCA - AFP, Serum, Open Spina Bifida  3. History of herpes genitalis     Valtrex @36  weeks.   Preterm labor symptoms and general obstetric precautions including but not limited to vaginal bleeding, contractions, leaking of fluid and fetal movement were reviewed in detail with the patient. Please refer to After Visit Summary for other counseling recommendations.  Return in about 4 weeks (around 08/02/2016) for ROB.   Roe Coombsachelle A Jonah Nestle, CNM

## 2016-07-06 LAB — CERVICOVAGINAL ANCILLARY ONLY
BACTERIAL VAGINITIS: NEGATIVE
Candida vaginitis: NEGATIVE
Chlamydia: NEGATIVE
NEISSERIA GONORRHEA: NEGATIVE
TRICH (WINDOWPATH): NEGATIVE

## 2016-07-10 LAB — AFP, SERUM, OPEN SPINA BIFIDA
AFP MoM: 0.8
AFP VALUE AFPOSL: 28.3 ng/mL
GEST. AGE ON COLLECTION DATE: 17.6 wk
MATERNAL AGE AT EDD: 31.5 a
OSBR Risk 1 IN: 10000
Test Results:: NEGATIVE
WEIGHT: 218 [lb_av]

## 2016-07-11 ENCOUNTER — Other Ambulatory Visit: Payer: Self-pay | Admitting: *Deleted

## 2016-07-11 LAB — MATERNIT21 PLUS CORE+SCA
CHROMOSOME 13: NEGATIVE
CHROMOSOME 21: NEGATIVE
Chromosome 18: NEGATIVE
Y Chromosome: DETECTED

## 2016-07-11 MED ORDER — PRENATE MINI 18-0.6-0.4-350 MG PO CAPS: 1.0000 | ORAL_CAPSULE | Freq: Every day | ORAL | 11 refills | Status: DC

## 2016-07-11 NOTE — Progress Notes (Signed)
Pt states she called office this morning for PNV Rx to be sent. No PNV on med list. Prenate mini sent in to pharmacy.

## 2016-07-14 ENCOUNTER — Other Ambulatory Visit: Payer: Self-pay | Admitting: Certified Nurse Midwife

## 2016-07-14 ENCOUNTER — Ambulatory Visit (HOSPITAL_COMMUNITY)
Admission: RE | Admit: 2016-07-14 | Discharge: 2016-07-14 | Disposition: A | Discharge status: Home / Self Care | Payer: Medicaid Other | Source: Ambulatory Visit | Attending: Certified Nurse Midwife | Admitting: Certified Nurse Midwife

## 2016-07-14 DIAGNOSIS — O0990 Supervision of high risk pregnancy, unspecified, unspecified trimester: Secondary | ICD-10-CM

## 2016-07-14 DIAGNOSIS — O358XX0 Maternal care for other (suspected) fetal abnormality and damage, not applicable or unspecified: Secondary | ICD-10-CM | POA: Insufficient documentation

## 2016-07-14 DIAGNOSIS — Z363 Encounter for antenatal screening for malformations: Secondary | ICD-10-CM | POA: Diagnosis present

## 2016-07-14 DIAGNOSIS — Z3A20 20 weeks gestation of pregnancy: Secondary | ICD-10-CM | POA: Diagnosis not present

## 2016-08-02 ENCOUNTER — Ambulatory Visit (INDEPENDENT_AMBULATORY_CARE_PROVIDER_SITE_OTHER): Payer: Medicaid Other | Admitting: Certified Nurse Midwife

## 2016-08-02 VITALS — BP 112/71 | HR 80 | Wt 220.5 lb

## 2016-08-02 DIAGNOSIS — O98119 Syphilis complicating pregnancy, unspecified trimester: Secondary | ICD-10-CM

## 2016-08-02 DIAGNOSIS — E6609 Other obesity due to excess calories: Secondary | ICD-10-CM

## 2016-08-02 DIAGNOSIS — Z6833 Body mass index (BMI) 33.0-33.9, adult: Secondary | ICD-10-CM

## 2016-08-02 DIAGNOSIS — L732 Hidradenitis suppurativa: Secondary | ICD-10-CM

## 2016-08-02 DIAGNOSIS — O99210 Obesity complicating pregnancy, unspecified trimester: Secondary | ICD-10-CM | POA: Insufficient documentation

## 2016-08-02 DIAGNOSIS — Z8619 Personal history of other infectious and parasitic diseases: Secondary | ICD-10-CM

## 2016-08-02 DIAGNOSIS — O09892 Supervision of other high risk pregnancies, second trimester: Secondary | ICD-10-CM

## 2016-08-02 DIAGNOSIS — O0990 Supervision of high risk pregnancy, unspecified, unspecified trimester: Secondary | ICD-10-CM

## 2016-08-02 DIAGNOSIS — Z6791 Unspecified blood type, Rh negative: Secondary | ICD-10-CM | POA: Insufficient documentation

## 2016-08-02 DIAGNOSIS — O26899 Other specified pregnancy related conditions, unspecified trimester: Secondary | ICD-10-CM | POA: Insufficient documentation

## 2016-08-02 NOTE — Patient Instructions (Addendum)
Second Trimester of Pregnancy The second trimester is from week 13 through week 28 (months 4 through 6). The second trimester is often a time when you feel your best. Your body has also adjusted to being pregnant, and you begin to feel better physically. Usually, morning sickness has lessened or quit completely, you may have more energy, and you may have an increase in appetite. The second trimester is also a time when the fetus is growing rapidly. At the end of the sixth month, the fetus is about 9 inches long and weighs about 1 pounds. You will likely begin to feel the baby move (quickening) between 18 and 20 weeks of the pregnancy. Body changes during your second trimester Your body continues to go through many changes during your second trimester. The changes vary from woman to woman.  Your weight will continue to increase. You will notice your lower abdomen bulging out.  You may begin to get stretch marks on your hips, abdomen, and breasts.  You may develop headaches that can be relieved by medicines. The medicines should be approved by your health care provider.  You may urinate more often because the fetus is pressing on your bladder.  You may develop or continue to have heartburn as a result of your pregnancy.  You may develop constipation because certain hormones are causing the muscles that push waste through your intestines to slow down.  You may develop hemorrhoids or swollen, bulging veins (varicose veins).  You may have back pain. This is caused by:  Weight gain.  Pregnancy hormones that are relaxing the joints in your pelvis.  A shift in weight and the muscles that support your balance.  Your breasts will continue to grow and they will continue to become tender.  Your gums may bleed and may be sensitive to brushing and flossing.  Dark spots or blotches (chloasma, mask of pregnancy) may develop on your face. This will likely fade after the baby is born.  A dark line  from your belly button to the pubic area (linea nigra) may appear. This will likely fade after the baby is born.  You may have changes in your hair. These can include thickening of your hair, rapid growth, and changes in texture. Some women also have hair loss during or after pregnancy, or hair that feels dry or thin. Your hair will most likely return to normal after your baby is born. What to expect at prenatal visits During a routine prenatal visit:  You will be weighed to make sure you and the fetus are growing normally.  Your blood pressure will be taken.  Your abdomen will be measured to track your baby's growth.  The fetal heartbeat will be listened to.  Any test results from the previous visit will be discussed. Your health care provider may ask you:  How you are feeling.  If you are feeling the baby move.  If you have had any abnormal symptoms, such as leaking fluid, bleeding, severe headaches, or abdominal cramping.  If you are using any tobacco products, including cigarettes, chewing tobacco, and electronic cigarettes.  If you have any questions. Other tests that may be performed during your second trimester include:  Blood tests that check for:  Low iron levels (anemia).  Gestational diabetes (between 24 and 28 weeks).  Rh antibodies. This is to check for a protein on red blood cells (Rh factor).  Urine tests to check for infections, diabetes, or protein in the urine.  An ultrasound to   confirm the proper growth and development of the baby.  An amniocentesis to check for possible genetic problems.  Fetal screens for spina bifida and Down syndrome.  HIV (human immunodeficiency virus) testing. Routine prenatal testing includes screening for HIV, unless you choose not to have this test. Follow these instructions at home: Eating and drinking  Continue to eat regular, healthy meals.  Avoid raw meat, uncooked cheese, cat litter boxes, and soil used by cats. These  carry germs that can cause birth defects in the baby.  Take your prenatal vitamins.  Take 1500-2000 mg of calcium daily starting at the 20th week of pregnancy until you deliver your baby.  If you develop constipation:  Take over-the-counter or prescription medicines.  Drink enough fluid to keep your urine clear or pale yellow.  Eat foods that are high in fiber, such as fresh fruits and vegetables, whole grains, and beans.  Limit foods that are high in fat and processed sugars, such as fried and sweet foods. Activity  Exercise only as directed by your health care provider. Experiencing uterine cramps is a good sign to stop exercising.  Avoid heavy lifting, wear low heel shoes, and practice good posture.  Wear your seat belt at all times when driving.  Rest with your legs elevated if you have leg cramps or low back pain.  Wear a good support bra for breast tenderness.  Do not use hot tubs, steam rooms, or saunas. Lifestyle  Avoid all smoking, herbs, alcohol, and unprescribed drugs. These chemicals affect the formation and growth of the baby.  Do not use any products that contain nicotine or tobacco, such as cigarettes and e-cigarettes. If you need help quitting, ask your health care provider.  A sexual relationship may be continued unless your health care provider directs you otherwise. General instructions  Follow your health care provider's instructions regarding medicine use. There are medicines that are either safe or unsafe to take during pregnancy.  Take warm sitz baths to soothe any pain or discomfort caused by hemorrhoids. Use hemorrhoid cream if your health care provider approves.  If you develop varicose veins, wear support hose. Elevate your feet for 15 minutes, 3-4 times a day. Limit salt in your diet.  Visit your dentist if you have not gone yet during your pregnancy. Use a soft toothbrush to brush your teeth and be gentle when you floss.  Keep all follow-up  prenatal visits as told by your health care provider. This is important. Contact a health care provider if:  You have dizziness.  You have mild pelvic cramps, pelvic pressure, or nagging pain in the abdominal area.  You have persistent nausea, vomiting, or diarrhea.  You have a bad smelling vaginal discharge.  You have pain with urination. Get help right away if:  You have a fever.  You are leaking fluid from your vagina.  You have spotting or bleeding from your vagina.  You have severe abdominal cramping or pain.  You have rapid weight gain or weight loss.  You have shortness of breath with chest pain.  You notice sudden or extreme swelling of your face, hands, ankles, feet, or legs.  You have not felt your baby move in over an hour.  You have severe headaches that do not go away with medicine.  You have vision changes. Summary  The second trimester is from week 13 through week 28 (months 4 through 6). It is also a time when the fetus is growing rapidly.  Your body goes   through many changes during pregnancy. The changes vary from woman to woman.  Avoid all smoking, herbs, alcohol, and unprescribed drugs. These chemicals affect the formation and growth your baby.  Do not use any tobacco products, such as cigarettes, chewing tobacco, and e-cigarettes. If you need help quitting, ask your health care provider.  Contact your health care provider if you have any questions. Keep all prenatal visits as told by your health care provider. This is important. This information is not intended to replace advice given to you by your health care provider. Make sure you discuss any questions you have with your health care provider. Document Released: 05/24/2001 Document Revised: 11/05/2015 Document Reviewed: 07/31/2012 [employer]vier Interactive Patient Education  2017 [employer]  

## 2016-08-02 NOTE — Progress Notes (Signed)
   PRENATAL VISIT NOTE  Subjective:  Diamond Burton is a 31 y.o. G3P2002 at 55104w6d being seen today for ongoing prenatal care.  She is currently monitored for the following issues for this high-risk pregnancy and has Hydradenitis; Supervision of high risk pregnancy, antepartum; History of herpes genitalis; Syphilis affecting pregnancy, antepartum; Rh negative state in antepartum period; and Obese on her problem list.  Patient reports no complaints.  Contractions: Not present. Vag. Bleeding: None.  Movement: Present. Denies leaking of fluid.   The following portions of the patient's history were reviewed and updated as appropriate: allergies, current medications, past family history, past medical history, past social history, past surgical history and problem list. Problem list updated.  Objective:   Vitals:   08/02/16 1106  BP: 112/71  Pulse: 80  Weight: 220 lb 8 oz (100 kg)    Fetal Status: Fetal Heart Rate (bpm): 150 Fundal Height: 21 cm Movement: Present     General:  Alert, oriented and cooperative. Patient is in no acute distress.  Skin: Skin is warm and dry. No rash noted.   Cardiovascular: Normal heart rate noted  Respiratory: Normal respiratory effort, no problems with respiration noted  Abdomen: Soft, gravid, appropriate for gestational age. Pain/Pressure: Absent     Pelvic:  Cervical exam deferred        Extremities: Normal range of motion.  Edema: None  Mental Status: Normal mood and affect. Normal behavior. Normal judgment and thought content.   Assessment and Plan:  Pregnancy: G3P2002 at 23104w6d  1. Supervision of high risk pregnancy, antepartum     Doing well.   - RPR - Hemoglobin A1c  2. Hydradenitis     Stable  3. Syphilis affecting pregnancy, antepartum      RPR/Titers today  4. Rh negative state in antepartum period     Rhogam @28  wks  5. History of herpes genitalis     On valtrex  6. Class 1 obesity due to excess calories without serious comorbidity  with body mass index (BMI) of 33.0 to 33.9 in adult      Weight gain this pregnancy 11 lb total.   Preterm labor symptoms and general obstetric precautions including but not limited to vaginal bleeding, contractions, leaking of fluid and fetal movement were reviewed in detail with the patient. Please refer to After Visit Summary for other counseling recommendations.  Return in about 4 weeks (around 08/30/2016) for ROB, HOB.   Roe Coombsachelle A Mohanad Carsten, CNM

## 2016-08-03 LAB — RPR: RPR: NONREACTIVE

## 2016-08-03 LAB — HEMOGLOBIN A1C
Est. average glucose Bld gHb Est-mCnc: 100
HEMOGLOBIN A1C: 5.1 % (ref 4.8–5.6)

## 2016-08-05 ENCOUNTER — Other Ambulatory Visit: Payer: Self-pay | Admitting: Certified Nurse Midwife

## 2016-08-30 ENCOUNTER — Telehealth: Payer: Self-pay | Admitting: *Deleted

## 2016-08-30 ENCOUNTER — Ambulatory Visit (INDEPENDENT_AMBULATORY_CARE_PROVIDER_SITE_OTHER): Payer: Medicaid Other | Admitting: Obstetrics & Gynecology

## 2016-08-30 VITALS — BP 129/73 | HR 82 | Wt 227.6 lb

## 2016-08-30 DIAGNOSIS — O0990 Supervision of high risk pregnancy, unspecified, unspecified trimester: Secondary | ICD-10-CM

## 2016-08-30 DIAGNOSIS — L732 Hidradenitis suppurativa: Secondary | ICD-10-CM

## 2016-08-30 DIAGNOSIS — O0992 Supervision of high risk pregnancy, unspecified, second trimester: Secondary | ICD-10-CM

## 2016-08-30 MED ORDER — FLUCONAZOLE 150 MG PO TABS: 150.0000 mg | ORAL_TABLET | Freq: Once | ORAL | 0 refills | Status: AC

## 2016-08-30 MED ORDER — SULFAMETHOXAZOLE-TRIMETHOPRIM 800-160 MG PO TABS: 1.0000 | ORAL_TABLET | Freq: Two times a day (BID) | ORAL | 0 refills | Status: DC

## 2016-08-30 NOTE — Progress Notes (Signed)
Patient complains of vaginal pain, itching,discharge and swelling. She also complains of thinning of her hair and eyebrows.

## 2016-08-30 NOTE — Progress Notes (Signed)
   PRENATAL VISIT NOTE  Subjective:  Diamond Burton is a [age] y.o. G3P2002 at 3037w6d being seen today for ongoing prenatal care.  She is currently monitored for the following issues for this low-risk pregnancy and has Hydradenitis; Supervision of high risk pregnancy, antepartum; History of herpes genitalis; Syphilis affecting pregnancy, antepartum; Rh negative state in antepartum period; and Obese on her problem list. She has lesions on her vulva Patient reports vaginal irritation.  Contractions: Not present. Vag. Bleeding: None.  Movement: Present. Denies leaking of fluid.   The following portions of the patient's history were reviewed and updated as appropriate: allergies, current medications, past family history, past medical history, past social history, past surgical history and problem list. Problem list updated.  Objective:   Vitals:   08/30/16 1102  BP: 129/73  Pulse: 82  Weight: 227 lb 9.6 oz (103.2 kg)    Fetal Status: Fetal Heart Rate (bpm): 141   Movement: Present     General:  Alert, oriented and cooperative. Patient is in no acute distress.  Skin: Skin is warm and dry. No rash noted.   Cardiovascular: Normal heart rate noted  Respiratory: Normal respiratory effort, no problems with respiration noted  Abdomen: Soft, gravid, appropriate for gestational age. Pain/Pressure: Present     Pelvic:  Cervical exam deferred        Extremities: Normal range of motion.  Edema: None  Mental Status: Normal mood and affect. Normal behavior. Normal judgment and thought content.  Folliculitis on right vulva, perineum and right axilla Assessment and Plan:  Pregnancy: G3P2002 at 7237w6d  1. Supervision of high risk pregnancy, antepartum Folliculitis, Bactrim for 7 days and diflucan prn - fluconazole (DIFLUCAN) 150 MG tablet; Take 1 tablet (150 mg total) by mouth once.  Dispense: 1 tablet; Refill: 0  2. Hydradenitis  - sulfamethoxazole-trimethoprim (BACTRIM DS,SEPTRA DS) 800-160 MG  tablet; Take 1 tablet by mouth 2 (two) times daily.  Dispense: 14 tablet; Refill: 0  Preterm labor symptoms and general obstetric precautions including but not limited to vaginal bleeding, contractions, leaking of fluid and fetal movement were reviewed in detail with the patient. Please refer to After Visit Summary for other counseling recommendations.  Return in about 3 weeks (around 09/20/2016) for 2 hr GTT.   Adam PhenixJames G Joeanthony Seeling, MD

## 2016-08-30 NOTE — Patient Instructions (Signed)
Third Trimester of Pregnancy The third trimester is from week 28 through week 40 (months 7 through 9). The third trimester is a time when the unborn baby (fetus) is growing rapidly. At the end of the ninth month, the fetus is about 20 inches in length and weighs 6-10 pounds. Body changes during your third trimester Your body will continue to go through many changes during pregnancy. The changes vary from woman to woman. During the third trimester:  Your weight will continue to increase. You can expect to gain 25-35 pounds (11-16 kg) by the end of the pregnancy.  You may begin to get stretch marks on your hips, abdomen, and breasts.  You may urinate more often because the fetus is moving lower into your pelvis and pressing on your bladder.  You may develop or continue to have heartburn. This is caused by increased hormones that slow down muscles in the digestive tract.  You may develop or continue to have constipation because increased hormones slow digestion and cause the muscles that push waste through your intestines to relax.  You may develop hemorrhoids. These are swollen veins (varicose veins) in the rectum that can itch or be painful.  You may develop swollen, bulging veins (varicose veins) in your legs.  You may have increased body aches in the pelvis, back, or thighs. This is due to weight gain and increased hormones that are relaxing your joints.  You may have changes in your hair. These can include thickening of your hair, rapid growth, and changes in texture. Some women also have hair loss during or after pregnancy, or hair that feels dry or thin. Your hair will most likely return to normal after your baby is born.  Your breasts will continue to grow and they will continue to become tender. A yellow fluid (colostrum) may leak from your breasts. This is the first milk you are producing for your baby.  Your belly button may stick out.  You may notice more swelling in your hands,  face, or ankles.  You may have increased tingling or numbness in your hands, arms, and legs. The skin on your belly may also feel numb.  You may feel short of breath because of your expanding uterus.  You may have more problems sleeping. This can be caused by the size of your belly, increased need to urinate, and an increase in your body's metabolism.  You may notice the fetus "dropping," or moving lower in your abdomen (lightening).  You may have increased vaginal discharge.  You may notice your joints feel loose and you may have pain around your pelvic bone.  What to expect at prenatal visits You will have prenatal exams every 2 weeks until week 36. Then you will have weekly prenatal exams. During a routine prenatal visit:  You will be weighed to make sure you and the baby are growing normally.  Your blood pressure will be taken.  Your abdomen will be measured to track your baby's growth.  The fetal heartbeat will be listened to.  Any test results from the previous visit will be discussed.  You may have a cervical check near your due date to see if your cervix has softened or thinned (effaced).  You will be tested for Group B streptococcus. This happens between 35 and 37 weeks.  Your health care provider may ask you:  What your birth plan is.  How you are feeling.  If you are feeling the baby move.  If you have had   any abnormal symptoms, such as leaking fluid, bleeding, severe headaches, or abdominal cramping.  If you are using any tobacco products, including cigarettes, chewing tobacco, and electronic cigarettes.  If you have any questions.  Other tests or screenings that may be performed during your third trimester include:  Blood tests that check for low iron levels (anemia).  Fetal testing to check the health, activity level, and growth of the fetus. Testing is done if you have certain medical conditions or if there are problems during the  pregnancy.  Nonstress test (NST). This test checks the health of your baby to make sure there are no signs of problems, such as the baby not getting enough oxygen. During this test, a belt is placed around your belly. The baby is made to move, and its heart rate is monitored during movement.  What is false labor? False labor is a condition in which you feel small, irregular tightenings of the muscles in the womb (contractions) that usually go away with rest, changing position, or drinking water. These are called Braxton Hicks contractions. Contractions may last for hours, days, or even weeks before true labor sets in. If contractions come at regular intervals, become more frequent, increase in intensity, or become painful, you should see your health care provider. What are the signs of labor?  Abdominal cramps.  Regular contractions that start at 10 minutes apart and become stronger and more frequent with time.  Contractions that start on the top of the uterus and spread down to the lower abdomen and back.  Increased pelvic pressure and dull back pain.  A watery or bloody mucus discharge that comes from the vagina.  Leaking of amniotic fluid. This is also known as your "water breaking." It could be a slow trickle or a gush. Let your health care provider know if it has a color or strange odor. If you have any of these signs, call your health care provider right away, even if it is before your due date. Follow these instructions at home: Medicines  Follow your health care provider's instructions regarding medicine use. Specific medicines may be either safe or unsafe to take during pregnancy.  Take a prenatal vitamin that contains at least 600 micrograms (mcg) of folic acid.  If you develop constipation, try taking a stool softener if your health care provider approves. Eating and drinking  Eat a balanced diet that includes fresh fruits and vegetables, whole grains, good sources of protein  such as meat, eggs, or tofu, and low-fat dairy. Your health care provider will help you determine the amount of weight gain that is right for you.  Avoid raw meat and uncooked cheese. These carry germs that can cause birth defects in the baby.  If you have low calcium intake from food, talk to your health care provider about whether you should take a daily calcium supplement.  Eat four or five small meals rather than three large meals a day.  Limit foods that are high in fat and processed sugars, such as fried and sweet foods.  To prevent constipation: ? Drink enough fluid to keep your urine clear or pale yellow. ? Eat foods that are high in fiber, such as fresh fruits and vegetables, whole grains, and beans. Activity  Exercise only as directed by your health care provider. Most women can continue their usual exercise routine during pregnancy. Try to exercise for 30 minutes at least 5 days a week. Stop exercising if you experience uterine contractions.  Avoid heavy   lifting.  Do not exercise in extreme heat or humidity, or at high altitudes.  Wear low-heel, comfortable shoes.  Practice good posture.  You may continue to have sex unless your health care provider tells you otherwise. Relieving pain and discomfort  Take frequent breaks and rest with your legs elevated if you have leg cramps or low back pain.  Take warm sitz baths to soothe any pain or discomfort caused by hemorrhoids. Use hemorrhoid cream if your health care provider approves.  Wear a good support bra to prevent discomfort from breast tenderness.  If you develop varicose veins: ? Wear support pantyhose or compression stockings as told by your healthcare provider. ? Elevate your feet for 15 minutes, 3-4 times a day. Prenatal care  Write down your questions. Take them to your prenatal visits.  Keep all your prenatal visits as told by your health care provider. This is important. Safety  Wear your seat belt at  all times when driving.  Make a list of emergency phone numbers, including numbers for family, friends, the hospital, and police and fire departments. General instructions  Avoid cat litter boxes and soil used by cats. These carry germs that can cause birth defects in the baby. If you have a cat, ask someone to clean the litter box for you.  Do not travel far distances unless it is absolutely necessary and only with the approval of your health care provider.  Do not use hot tubs, steam rooms, or saunas.  Do not drink alcohol.  Do not use any products that contain nicotine or tobacco, such as cigarettes and e-cigarettes. If you need help quitting, ask your health care provider.  Do not use any medicinal herbs or unprescribed drugs. These chemicals affect the formation and growth of the baby.  Do not douche or use tampons or scented sanitary pads.  Do not cross your legs for long periods of time.  To prepare for the arrival of your baby: ? Take prenatal classes to understand, practice, and ask questions about labor and delivery. ? Make a trial run to the hospital. ? Visit the hospital and tour the maternity area. ? Arrange for maternity or paternity leave through employers. ? Arrange for family and friends to take care of pets while you are in the hospital. ? Purchase a rear-facing car seat and make sure you know how to install it in your car. ? Pack your hospital bag. ? Prepare the baby's nursery. Make sure to remove all pillows and stuffed animals from the baby's crib to prevent suffocation.  Visit your dentist if you have not gone during your pregnancy. Use a soft toothbrush to brush your teeth and be gentle when you floss. Contact a health care provider if:  You are unsure if you are in labor or if your water has broken.  You become dizzy.  You have mild pelvic cramps, pelvic pressure, or nagging pain in your abdominal area.  You have lower back pain.  You have persistent  nausea, vomiting, or diarrhea.  You have an unusual or bad smelling vaginal discharge.  You have pain when you urinate. Get help right away if:  Your water breaks before 37 weeks.  You have regular contractions less than 5 minutes apart before 37 weeks.  You have a fever.  You are leaking fluid from your vagina.  You have spotting or bleeding from your vagina.  You have severe abdominal pain or cramping.  You have rapid weight loss or weight gain.    You have shortness of breath with chest pain.  You notice sudden or extreme swelling of your face, hands, ankles, feet, or legs.  Your baby makes fewer than 10 movements in 2 hours.  You have severe headaches that do not go away when you take medicine.  You have vision changes. Summary  The third trimester is from week 28 through week 40, months 7 through 9. The third trimester is a time when the unborn baby (fetus) is growing rapidly.  During the third trimester, your discomfort may increase as you and your baby continue to gain weight. You may have abdominal, leg, and back pain, sleeping problems, and an increased need to urinate.  During the third trimester your breasts will keep growing and they will continue to become tender. A yellow fluid (colostrum) may leak from your breasts. This is the first milk you are producing for your baby.  False labor is a condition in which you feel small, irregular tightenings of the muscles in the womb (contractions) that eventually go away. These are called Braxton Hicks contractions. Contractions may last for hours, days, or even weeks before true labor sets in.  Signs of labor can include: abdominal cramps; regular contractions that start at 10 minutes apart and become stronger and more frequent with time; watery or bloody mucus discharge that comes from the vagina; increased pelvic pressure and dull back pain; and leaking of amniotic fluid. This information is not intended to replace advice  given to you by your health care provider. Make sure you discuss any questions you have with your health care provider. Document Released: 05/24/2001 Document Revised: 11/05/2015 Document Reviewed: 07/31/2012 Elsevier Interactive Patient Education  2017 [employer]  

## 2016-08-30 NOTE — Telephone Encounter (Signed)
Pharmacy calling about antibiotic prescribed by provider- would like to verify that provider wants that particular antibiotic due to class of medication and patient's pregnant state. Please call them to verify. They will not fill without a call back.

## 2016-08-31 ENCOUNTER — Telehealth: Payer: Self-pay | Admitting: *Deleted

## 2016-08-31 NOTE — Telephone Encounter (Signed)
[facility] is calling requesting call back from provider regarding Rx written for patient.  Pharmacist: Hessie DienerAlan [phone]

## 2016-09-02 ENCOUNTER — Telehealth: Payer: Self-pay | Admitting: *Deleted

## 2016-09-02 NOTE — Telephone Encounter (Signed)
Pharmacy calling to request a change in Rx for patient. Call forwarded to Dr Clearance CootsHarper and he contacted them to change Rx.

## 2016-09-20 ENCOUNTER — Other Ambulatory Visit: Payer: Medicaid Other

## 2016-09-20 ENCOUNTER — Encounter: Payer: Self-pay | Admitting: Obstetrics

## 2016-09-20 ENCOUNTER — Ambulatory Visit (INDEPENDENT_AMBULATORY_CARE_PROVIDER_SITE_OTHER): Payer: Medicaid Other | Admitting: Obstetrics

## 2016-09-20 ENCOUNTER — Encounter: Payer: Self-pay | Admitting: *Deleted

## 2016-09-20 VITALS — BP 116/77 | HR 79 | Wt 229.4 lb

## 2016-09-20 DIAGNOSIS — Z3480 Encounter for supervision of other normal pregnancy, unspecified trimester: Secondary | ICD-10-CM

## 2016-09-20 DIAGNOSIS — Z3483 Encounter for supervision of other normal pregnancy, third trimester: Secondary | ICD-10-CM

## 2016-09-20 DIAGNOSIS — O0990 Supervision of high risk pregnancy, unspecified, unspecified trimester: Secondary | ICD-10-CM

## 2016-09-20 NOTE — Progress Notes (Signed)
Subjective:  Diamond Burton is a 31 y.o. G3P2002 at [redacted]w[redacted]d being seen today for ongoing prenatal care.  She is currently monitored for the following issues for this low-risk pregnancy and has Hydradenitis; Supervision of high risk pregnancy, antepartum; History of herpes genitalis; Syphilis affecting pregnancy, antepartum; Rh negative state in antepartum period; and Obese on her problem list.  Patient reports no complaints.  Contractions: Not present. Vag. Bleeding: None.  Movement: Present. Denies leaking of fluid.   The following portions of the patient's history were reviewed and updated as appropriate: allergies, current medications, past family history, past medical history, past social history, past surgical history and problem list. Problem list updated.  Objective:   Vitals:   09/20/16 0958  BP: 116/77  Pulse: 79  Weight: 229 lb 6.4 oz (104.1 kg)    Fetal Status: Fetal Heart Rate (bpm): 140   Movement: Present     General:  Alert, oriented and cooperative. Patient is in no acute distress.  Skin: Skin is warm and dry. No rash noted.   Cardiovascular: Normal heart rate noted  Respiratory: Normal respiratory effort, no problems with respiration noted  Abdomen: Soft, gravid, appropriate for gestational age. Pain/Pressure: Present     Pelvic:  Cervical exam deferred        Extremities: Normal range of motion.  Edema: None  Mental Status: Normal mood and affect. Normal behavior. Normal judgment and thought content.   Urinalysis:      Assessment and Plan:  Pregnancy: G3P2002 at 105w6d  1. Supervision of high risk pregnancy, antepartum Rx: - Glucose Tolerance, 2 Hours w/1 Hour - CBC - HIV antibody (with reflex) - RPR - Antibody screen  Preterm labor symptoms and general obstetric precautions including but not limited to vaginal bleeding, contractions, leaking of fluid and fetal movement were reviewed in detail with the patient. Please refer to After Visit Summary for other  counseling recommendations.  Return in 2 weeks (on 10/04/2016).   Brock Bad, MDPatient ID: Diamond Burton, female   DOB: Sep 22, 1985, 31 y.o.   MRN: [id number]

## 2016-09-22 LAB — CBC
HEMOGLOBIN: 12.2 g/dL (ref 11.1–15.9)
Hematocrit: 36.7 % (ref 34.0–46.6)
MCH: 29.9 pg (ref 26.6–33.0)
MCHC: 33.2 g/dL (ref 31.5–35.7)
MCV: 90 fL (ref 79–97)
PLATELETS: 281 10*3/uL (ref 150–379)
RBC: 4.08 x10E6/uL (ref 3.77–5.28)
RDW: 14.7 % (ref 12.3–15.4)
WBC: 8.1 10*3/uL (ref 3.4–10.8)

## 2016-09-22 LAB — RPR, QUANT+TP ABS (REFLEX): T Pallidum Abs: POSITIVE — AB

## 2016-09-22 LAB — GLUCOSE TOLERANCE, 2 HOURS W/ 1HR
GLUCOSE, FASTING: 81 mg/dL (ref 65–91)
Glucose, 1 hour: 108 mg/dL (ref 65–179)
Glucose, 2 hour: 99 mg/dL (ref 65–152)

## 2016-09-22 LAB — RPR: RPR Ser Ql: REACTIVE — AB

## 2016-09-22 LAB — HIV ANTIBODY (ROUTINE TESTING W REFLEX): HIV Screen 4th Generation wRfx: NONREACTIVE

## 2016-09-22 LAB — ANTIBODY SCREEN: ANTIBODY SCREEN: NEGATIVE

## 2016-10-04 ENCOUNTER — Ambulatory Visit (INDEPENDENT_AMBULATORY_CARE_PROVIDER_SITE_OTHER): Payer: Medicaid Other | Admitting: Obstetrics

## 2016-10-04 ENCOUNTER — Encounter: Payer: Self-pay | Admitting: Obstetrics

## 2016-10-04 ENCOUNTER — Other Ambulatory Visit (HOSPITAL_COMMUNITY)
Admission: RE | Admit: 2016-10-04 | Discharge: 2016-10-04 | Disposition: A | Discharge status: Home / Self Care | Payer: Medicaid Other | Source: Ambulatory Visit | Attending: Obstetrics | Admitting: Obstetrics

## 2016-10-04 VITALS — BP 124/80 | HR 79 | Temp 98.7°F | Wt 232.0 lb

## 2016-10-04 DIAGNOSIS — N898 Other specified noninflammatory disorders of vagina: Secondary | ICD-10-CM

## 2016-10-04 DIAGNOSIS — O09893 Supervision of other high risk pregnancies, third trimester: Secondary | ICD-10-CM

## 2016-10-04 DIAGNOSIS — E6609 Other obesity due to excess calories: Secondary | ICD-10-CM

## 2016-10-04 DIAGNOSIS — Z6791 Unspecified blood type, Rh negative: Secondary | ICD-10-CM

## 2016-10-04 DIAGNOSIS — Z3A30 30 weeks gestation of pregnancy: Secondary | ICD-10-CM | POA: Diagnosis not present

## 2016-10-04 DIAGNOSIS — Z6833 Body mass index (BMI) 33.0-33.9, adult: Secondary | ICD-10-CM

## 2016-10-04 DIAGNOSIS — O26893 Other specified pregnancy related conditions, third trimester: Secondary | ICD-10-CM | POA: Insufficient documentation

## 2016-10-04 DIAGNOSIS — O26899 Other specified pregnancy related conditions, unspecified trimester: Principal | ICD-10-CM

## 2016-10-04 DIAGNOSIS — A6009 Herpesviral infection of other urogenital tract: Secondary | ICD-10-CM

## 2016-10-04 DIAGNOSIS — Z3689 Encounter for other specified antenatal screening: Secondary | ICD-10-CM

## 2016-10-04 DIAGNOSIS — O98313 Other infections with a predominantly sexual mode of transmission complicating pregnancy, third trimester: Secondary | ICD-10-CM

## 2016-10-04 MED ORDER — VALACYCLOVIR HCL 1 G PO TABS: 1000.0000 mg | ORAL_TABLET | Freq: Two times a day (BID) | ORAL | 3 refills | Status: DC

## 2016-10-04 NOTE — Progress Notes (Signed)
Subjective:  Diamond Burton is a 31 y.o. G3P2002 at [redacted]w[redacted]d being seen today for ongoing prenatal care.  She is currently monitored for the following issues for this high risk pregnancy and has Hydradenitis; Supervision of high risk pregnancy, antepartum; History of herpes genitalis; Syphilis affecting pregnancy, antepartum; Rh negative state in antepartum period; and Obese on her problem list.  Patient reports no complaints.  Contractions: Not present. Vag. Bleeding: None.  Movement: Present. Denies leaking of fluid.   The following portions of the patient's history were reviewed and updated as appropriate: allergies, current medications, past family history, past medical history, past social history, past surgical history and problem list. Problem list updated.  Objective:   Vitals:   10/04/16 1010  BP: 124/80  Pulse: 79  Temp: 98.7 F (37.1 C)  Weight: 232 lb (105.2 kg)    Fetal Status: Fetal Heart Rate (bpm): 140   Movement: Present     General:  Alert, oriented and cooperative. Patient is in no acute distress.  Skin: Skin is warm and dry. No rash noted.   Cardiovascular: Normal heart rate noted  Respiratory: Normal respiratory effort, no problems with respiration noted  Abdomen: Soft, gravid, appropriate for gestational age. Pain/Pressure: Absent     Pelvic:  Cervical exam deferred        Extremities: Normal range of motion.  Edema: None  Mental Status: Normal mood and affect. Normal behavior. Normal judgment and thought content.   Urinalysis: Urine Protein: Negative Urine Glucose: Negative  Assessment and Plan:  Pregnancy: G3P2002 at [redacted]w[redacted]d  1. Vaginal discharge during pregnancy, antepartum Rx: - Cervicovaginal ancillary only  2. Encounter for ultrasound to assess interval growth of fetus - suspect LGA Rx: - [place] MFM OB FOLLOW UP; Future  3. Genital herpes affecting pregnancy in third trimester Rx: - valACYclovir (VALTREX) 1000 MG tablet; Take 1 tablet (1,000 mg total)  by mouth 2 (two) times daily.  Dispense: 90 tablet; Refill: 3  4. Class 1 obesity due to excess calories without serious comorbidity with body mass index (BMI) of 33.0 to 33.9 in adult   5. Rh negative state in antepartum period - needs Rhogam next visit  Preterm labor symptoms and general obstetric precautions including but not limited to vaginal bleeding, contractions, leaking of fluid and fetal movement were reviewed in detail with the patient. Please refer to After Visit Summary for other counseling recommendations.  Return in about 2 weeks (around 10/18/2016) for ROB.   Brock Bad, MD

## 2016-10-05 ENCOUNTER — Other Ambulatory Visit: Payer: Self-pay | Admitting: Obstetrics

## 2016-10-05 DIAGNOSIS — A5901 Trichomonal vulvovaginitis: Secondary | ICD-10-CM

## 2016-10-05 LAB — CERVICOVAGINAL ANCILLARY ONLY
Bacterial vaginitis: NEGATIVE
Candida vaginitis: NEGATIVE
Chlamydia: NEGATIVE
Neisseria Gonorrhea: NEGATIVE
Trichomonas: POSITIVE — AB

## 2016-10-05 MED ORDER — METRONIDAZOLE 500 MG PO TABS: 2000.0000 mg | ORAL_TABLET | Freq: Once | ORAL | 0 refills | Status: AC

## 2016-10-06 ENCOUNTER — Ambulatory Visit (HOSPITAL_COMMUNITY): Payer: Medicaid Other

## 2016-10-14 ENCOUNTER — Other Ambulatory Visit: Payer: Self-pay | Admitting: Obstetrics

## 2016-10-14 ENCOUNTER — Ambulatory Visit (HOSPITAL_COMMUNITY)
Admission: RE | Admit: 2016-10-14 | Discharge: 2016-10-14 | Disposition: A | Discharge status: Home / Self Care | Payer: Medicaid Other | Source: Ambulatory Visit | Attending: Obstetrics | Admitting: Obstetrics

## 2016-10-14 DIAGNOSIS — Z3689 Encounter for other specified antenatal screening: Secondary | ICD-10-CM | POA: Diagnosis not present

## 2016-10-14 DIAGNOSIS — O365930 Maternal care for other known or suspected poor fetal growth, third trimester, not applicable or unspecified: Secondary | ICD-10-CM | POA: Insufficient documentation

## 2016-10-14 DIAGNOSIS — Z362 Encounter for other antenatal screening follow-up: Secondary | ICD-10-CM

## 2016-10-14 DIAGNOSIS — Z3A33 33 weeks gestation of pregnancy: Secondary | ICD-10-CM | POA: Insufficient documentation

## 2016-10-17 ENCOUNTER — Other Ambulatory Visit (HOSPITAL_COMMUNITY): Payer: Self-pay | Admitting: *Deleted

## 2016-10-17 DIAGNOSIS — O365930 Maternal care for other known or suspected poor fetal growth, third trimester, not applicable or unspecified: Secondary | ICD-10-CM

## 2016-10-18 ENCOUNTER — Ambulatory Visit (INDEPENDENT_AMBULATORY_CARE_PROVIDER_SITE_OTHER): Payer: Medicaid Other | Admitting: Obstetrics

## 2016-10-18 ENCOUNTER — Encounter: Payer: Self-pay | Admitting: Obstetrics

## 2016-10-18 VITALS — BP 126/70 | HR 94 | Wt 229.4 lb

## 2016-10-18 DIAGNOSIS — Z6791 Unspecified blood type, Rh negative: Secondary | ICD-10-CM

## 2016-10-18 DIAGNOSIS — L0232 Furuncle of buttock: Secondary | ICD-10-CM

## 2016-10-18 DIAGNOSIS — O09892 Supervision of other high risk pregnancies, second trimester: Secondary | ICD-10-CM

## 2016-10-18 DIAGNOSIS — O365921 Maternal care for other known or suspected poor fetal growth, second trimester, fetus 1: Secondary | ICD-10-CM

## 2016-10-18 DIAGNOSIS — O0990 Supervision of high risk pregnancy, unspecified, unspecified trimester: Secondary | ICD-10-CM

## 2016-10-18 DIAGNOSIS — O0992 Supervision of high risk pregnancy, unspecified, second trimester: Secondary | ICD-10-CM

## 2016-10-18 DIAGNOSIS — O26899 Other specified pregnancy related conditions, unspecified trimester: Secondary | ICD-10-CM

## 2016-10-18 DIAGNOSIS — O360930 Maternal care for other rhesus isoimmunization, third trimester, not applicable or unspecified: Secondary | ICD-10-CM | POA: Diagnosis not present

## 2016-10-18 DIAGNOSIS — O365990 Maternal care for other known or suspected poor fetal growth, unspecified trimester, not applicable or unspecified: Secondary | ICD-10-CM

## 2016-10-18 DIAGNOSIS — Z3480 Encounter for supervision of other normal pregnancy, unspecified trimester: Secondary | ICD-10-CM

## 2016-10-18 MED ORDER — RHO D IMMUNE GLOBULIN 1500 UNIT/2ML IJ SOSY
300.0000 ug | PREFILLED_SYRINGE | Freq: Once | INTRAMUSCULAR | Status: AC
  Administered 2016-10-18: 300 ug via INTRAMUSCULAR

## 2016-10-18 MED ORDER — RHO D IMMUNE GLOBULIN 1500 UNIT/2ML IJ SOSY: 300.0000 ug | PREFILLED_SYRINGE | Freq: Once | INTRAMUSCULAR | 0 refills | Status: DC

## 2016-10-18 MED ORDER — RHO D IMMUNE GLOBULIN 1500 UNIT/2ML IJ SOSY: 300.0000 ug | PREFILLED_SYRINGE | Freq: Once | INTRAMUSCULAR | Status: DC

## 2016-10-18 MED ORDER — AMOXICILLIN-POT CLAVULANATE 875-125 MG PO TABS: 1.0000 | ORAL_TABLET | Freq: Two times a day (BID) | ORAL | 1 refills | Status: DC

## 2016-10-18 NOTE — Progress Notes (Signed)
Subjective:  Diamond Burton is a 31 y.o. G3P2002 at 416w6d being seen today for ongoing prenatal care.  She is currently monitored for the following issues for this high-risk pregnancy and has Hydradenitis; Supervision of high risk pregnancy, antepartum; History of herpes genitalis; Syphilis affecting pregnancy, antepartum; Rh negative state in antepartum period; and Obese on her problem list.  Patient reports boils outside of vagina.  Contractions: Irregular. Vag. Bleeding: None.  Movement: Present. Denies leaking of fluid.   The following portions of the patient's history were reviewed and updated as appropriate: allergies, current medications, past family history, past medical history, past social history, past surgical history and problem list. Problem list updated.  Objective:   Vitals:   10/18/16 1106  BP: 126/70  Pulse: 94  Weight: 229 lb 6.4 oz (104.1 kg)    Fetal Status: Fetal Heart Rate (bpm): 140   Movement: Present     General:  Alert, oriented and cooperative. Patient is in no acute distress.  Skin: Skin is warm and dry. No rash noted.   Cardiovascular: Normal heart rate noted  Respiratory: Normal respiratory effort, no problems with respiration noted  Abdomen: Soft, gravid, appropriate for gestational age. Pain/Pressure: Present     Pelvic:  Cervical exam deferred        Extremities: Normal range of motion.  Edema: None  Mental Status: Normal mood and affect. Normal behavior. Normal judgment and thought content.   Urinalysis:      Assessment and Plan:  Pregnancy: G3P2002 at 9816w6d  1. Rh negative state in antepartum period Rx: - rho (d) immune globulin (RHIG/RHOPHYLAC) injection 300 mcg; Inject 2 mLs (300 mcg total) into the muscle once.  2. Boil, buttock Rx: - amoxicillin-clavulanate (AUGMENTIN) 875-125 MG tablet; Take 1 tablet by mouth 2 (two) times daily.  Dispense: 20 tablet; Refill: 1  3. IUGR - weekly BPP with UA Dopplers  Preterm labor symptoms and  general obstetric precautions including but not limited to vaginal bleeding, contractions, leaking of fluid and fetal movement were reviewed in detail with the patient. Please refer to After Visit Summary for other counseling recommendations.  Return in 1 week (on 10/25/2016) for ROB.  Weekly NST.   Brock BadHarper, Charles A, MDPatient ID: Diamond Burton, female   DOB: Jul 22, 1985, 31 y.o.   MRN: [id number]

## 2016-10-18 NOTE — Progress Notes (Signed)
Pt has been vomiting daily in the morning and at night. States that this started after taking atb that she was rxd last. Also complains of having boils in her vagina.

## 2016-10-20 ENCOUNTER — Ambulatory Visit (HOSPITAL_COMMUNITY)
Admission: RE | Admit: 2016-10-20 | Discharge: 2016-10-20 | Disposition: A | Discharge status: Home / Self Care | Payer: Medicaid Other | Source: Ambulatory Visit | Attending: Obstetrics | Admitting: Obstetrics

## 2016-10-20 ENCOUNTER — Encounter (HOSPITAL_COMMUNITY): Payer: Self-pay

## 2016-10-20 DIAGNOSIS — Z3A34 34 weeks gestation of pregnancy: Secondary | ICD-10-CM | POA: Diagnosis not present

## 2016-10-20 DIAGNOSIS — O365930 Maternal care for other known or suspected poor fetal growth, third trimester, not applicable or unspecified: Secondary | ICD-10-CM | POA: Insufficient documentation

## 2016-10-27 ENCOUNTER — Encounter (HOSPITAL_COMMUNITY): Payer: Self-pay

## 2016-10-27 ENCOUNTER — Ambulatory Visit (INDEPENDENT_AMBULATORY_CARE_PROVIDER_SITE_OTHER): Payer: Medicaid Other | Admitting: Obstetrics

## 2016-10-27 ENCOUNTER — Other Ambulatory Visit (HOSPITAL_COMMUNITY)
Admission: RE | Admit: 2016-10-27 | Discharge: 2016-10-27 | Disposition: A | Discharge status: Home / Self Care | Payer: Medicaid Other | Source: Ambulatory Visit | Attending: Obstetrics | Admitting: Obstetrics

## 2016-10-27 ENCOUNTER — Ambulatory Visit (HOSPITAL_COMMUNITY)
Admission: RE | Admit: 2016-10-27 | Discharge: 2016-10-27 | Disposition: A | Discharge status: Home / Self Care | Payer: Medicaid Other | Source: Ambulatory Visit | Attending: Obstetrics | Admitting: Obstetrics

## 2016-10-27 ENCOUNTER — Ambulatory Visit (HOSPITAL_COMMUNITY): Payer: Medicaid Other

## 2016-10-27 ENCOUNTER — Encounter: Payer: Self-pay | Admitting: Obstetrics

## 2016-10-27 ENCOUNTER — Other Ambulatory Visit (HOSPITAL_COMMUNITY): Payer: Self-pay | Admitting: *Deleted

## 2016-10-27 VITALS — BP 119/70 | HR 82 | Wt 232.1 lb

## 2016-10-27 DIAGNOSIS — L298 Other pruritus: Secondary | ICD-10-CM

## 2016-10-27 DIAGNOSIS — O0990 Supervision of high risk pregnancy, unspecified, unspecified trimester: Secondary | ICD-10-CM

## 2016-10-27 DIAGNOSIS — N898 Other specified noninflammatory disorders of vagina: Secondary | ICD-10-CM

## 2016-10-27 DIAGNOSIS — O365930 Maternal care for other known or suspected poor fetal growth, third trimester, not applicable or unspecified: Secondary | ICD-10-CM

## 2016-10-27 DIAGNOSIS — B369 Superficial mycosis, unspecified: Secondary | ICD-10-CM

## 2016-10-27 DIAGNOSIS — B3731 Acute candidiasis of vulva and vagina: Secondary | ICD-10-CM

## 2016-10-27 DIAGNOSIS — O365990 Maternal care for other known or suspected poor fetal growth, unspecified trimester, not applicable or unspecified: Secondary | ICD-10-CM

## 2016-10-27 DIAGNOSIS — B373 Candidiasis of vulva and vagina: Secondary | ICD-10-CM

## 2016-10-27 DIAGNOSIS — O0993 Supervision of high risk pregnancy, unspecified, third trimester: Secondary | ICD-10-CM

## 2016-10-27 MED ORDER — FLUCONAZOLE 150 MG PO TABS: 150.0000 mg | ORAL_TABLET | Freq: Once | ORAL | 0 refills | Status: AC

## 2016-10-27 MED ORDER — CLOTRIMAZOLE 1 % EX CREA: 1.0000 "application " | TOPICAL_CREAM | Freq: Two times a day (BID) | CUTANEOUS | 0 refills | Status: DC

## 2016-10-27 NOTE — Progress Notes (Signed)
Subjective:  Diamond Burton is a 31 y.o. G3P2002 at 6010w1d being seen today for ongoing prenatal care.  She is currently monitored for the following issues for this high-risk pregnancy and has Hydradenitis; Supervision of high risk pregnancy, antepartum; History of herpes genitalis; Syphilis affecting pregnancy, antepartum; Rh negative state in antepartum period; and Obese on her problem list.  Patient reports backache, nausea and vaginal irritation.  Contractions: Irregular. Vag. Bleeding: None.  Movement: Present. Denies leaking of fluid.   The following portions of the patient's history were reviewed and updated as appropriate: allergies, current medications, past family history, past medical history, past social history, past surgical history and problem list. Problem list updated.  Objective:   Vitals:   10/27/16 1346  BP: 119/70  Pulse: 82  Weight: 232 lb 1.6 oz (105.3 kg)    Fetal Status: Fetal Heart Rate (bpm): 140   Movement: Present     General:  Alert, oriented and cooperative. Patient is in no acute distress.  Skin: Skin is warm and dry. No rash noted.   Cardiovascular: Normal heart rate noted  Respiratory: Normal respiratory effort, no problems with respiration noted  Abdomen: Soft, gravid, appropriate for gestational age. Pain/Pressure: Present     Pelvic:  Cervical exam deferred        Extremities: Normal range of motion.  Edema: None  Mental Status: Normal mood and affect. Normal behavior. Normal judgment and thought content.   Urinalysis:      Assessment and Plan:  Pregnancy: G3P2002 at 4810w1d  1. Vaginal itching Rx - Cervicovaginal ancillary only - Diflucan Rx  2. Supervision of high risk pregnancy, antepartum   3. Poor fetal growth affecting management of mother, antepartum, single or unspecified fetus - Ultrasound for interval growth:this week  Preterm labor symptoms and general obstetric precautions including but not limited to vaginal bleeding,  contractions, leaking of fluid and fetal movement were reviewed in detail with the patient. Please refer to After Visit Summary for other counseling recommendations.  Return in 2 weeks (on 11/10/2016) for ROB.  GBS.   Brock BadHarper, Charles A, MDPatient ID: Diamond Burton, female   DOB: August 20, 1985, 31 y.o.   MRN: [id number]

## 2016-10-27 NOTE — Progress Notes (Signed)
Pt presents for routine prenatal visit. She also complains of having some vaginal itching, and would like to be checked.

## 2016-10-28 LAB — CERVICOVAGINAL ANCILLARY ONLY
BACTERIAL VAGINITIS: NEGATIVE
CANDIDA VAGINITIS: POSITIVE — AB
CHLAMYDIA, DNA PROBE: NEGATIVE
NEISSERIA GONORRHEA: NEGATIVE
Trichomonas: NEGATIVE

## 2016-10-30 ENCOUNTER — Other Ambulatory Visit: Payer: Self-pay | Admitting: Obstetrics

## 2016-10-30 DIAGNOSIS — B3731 Acute candidiasis of vulva and vagina: Secondary | ICD-10-CM

## 2016-10-30 DIAGNOSIS — B373 Candidiasis of vulva and vagina: Secondary | ICD-10-CM

## 2016-10-30 MED ORDER — FLUCONAZOLE 150 MG PO TABS: 150.0000 mg | ORAL_TABLET | Freq: Once | ORAL | 0 refills | Status: AC

## 2016-10-31 ENCOUNTER — Encounter: Payer: Self-pay | Admitting: *Deleted

## 2016-11-03 ENCOUNTER — Ambulatory Visit (HOSPITAL_COMMUNITY): Payer: Medicaid Other

## 2016-11-03 ENCOUNTER — Ambulatory Visit (HOSPITAL_COMMUNITY): Admission: RE | Admit: 2016-11-03 | Payer: Medicaid Other | Source: Ambulatory Visit

## 2016-11-10 ENCOUNTER — Ambulatory Visit (INDEPENDENT_AMBULATORY_CARE_PROVIDER_SITE_OTHER): Payer: Medicaid Other | Admitting: Obstetrics

## 2016-11-10 ENCOUNTER — Other Ambulatory Visit (HOSPITAL_COMMUNITY): Payer: Self-pay | Admitting: Obstetrics and Gynecology

## 2016-11-10 ENCOUNTER — Ambulatory Visit (HOSPITAL_COMMUNITY)
Admission: RE | Admit: 2016-11-10 | Discharge: 2016-11-10 | Disposition: A | Discharge status: Home / Self Care | Payer: Medicaid Other | Source: Ambulatory Visit | Attending: Obstetrics | Admitting: Obstetrics

## 2016-11-10 ENCOUNTER — Encounter (HOSPITAL_COMMUNITY): Payer: Self-pay

## 2016-11-10 ENCOUNTER — Other Ambulatory Visit (HOSPITAL_COMMUNITY)
Admission: RE | Admit: 2016-11-10 | Discharge: 2016-11-10 | Disposition: A | Discharge status: Home / Self Care | Payer: Medicaid Other | Source: Ambulatory Visit | Attending: Obstetrics | Admitting: Obstetrics

## 2016-11-10 VITALS — BP 120/71 | HR 80 | Wt 234.3 lb

## 2016-11-10 DIAGNOSIS — O0990 Supervision of high risk pregnancy, unspecified, unspecified trimester: Secondary | ICD-10-CM | POA: Diagnosis present

## 2016-11-10 DIAGNOSIS — O365910 Maternal care for other known or suspected poor fetal growth, first trimester, not applicable or unspecified: Secondary | ICD-10-CM

## 2016-11-10 DIAGNOSIS — Z3A37 37 weeks gestation of pregnancy: Secondary | ICD-10-CM

## 2016-11-10 DIAGNOSIS — O365930 Maternal care for other known or suspected poor fetal growth, third trimester, not applicable or unspecified: Secondary | ICD-10-CM

## 2016-11-10 DIAGNOSIS — O365990 Maternal care for other known or suspected poor fetal growth, unspecified trimester, not applicable or unspecified: Secondary | ICD-10-CM

## 2016-11-10 DIAGNOSIS — N766 Ulceration of vulva: Secondary | ICD-10-CM | POA: Diagnosis present

## 2016-11-10 DIAGNOSIS — O26899 Other specified pregnancy related conditions, unspecified trimester: Secondary | ICD-10-CM

## 2016-11-10 DIAGNOSIS — O09893 Supervision of other high risk pregnancies, third trimester: Secondary | ICD-10-CM

## 2016-11-10 DIAGNOSIS — Z6791 Unspecified blood type, Rh negative: Secondary | ICD-10-CM

## 2016-11-10 DIAGNOSIS — O0993 Supervision of high risk pregnancy, unspecified, third trimester: Secondary | ICD-10-CM

## 2016-11-10 LAB — OB RESULTS CONSOLE GC/CHLAMYDIA: GC PROBE AMP, GENITAL: NEGATIVE

## 2016-11-10 MED ORDER — AMOXICILLIN-POT CLAVULANATE 875-125 MG PO TABS: 1.0000 | ORAL_TABLET | Freq: Two times a day (BID) | ORAL | 2 refills | Status: DC

## 2016-11-10 NOTE — Progress Notes (Signed)
Subjective:  Diamond Burton is a 31 y.o. G3P2002 at 6538w1d being seen today for ongoing prenatal care.  She is currently monitored for the following issues for this high-risk pregnancy and has Hydradenitis; Supervision of high risk pregnancy, antepartum; History of herpes genitalis; Syphilis affecting pregnancy, antepartum; Rh negative state in antepartum period; and Obese on her problem list.  Patient reports sore in labial area, painful, different from boils.  Contractions: Irregular. Vag. Bleeding: None.  Movement: Present. Denies leaking of fluid.   The following portions of the patient's history were reviewed and updated as appropriate: allergies, current medications, past family history, past medical history, past social history, past surgical history and problem list. Problem list updated.  Objective:   Vitals:   11/10/16 1055  BP: 120/71  Pulse: 80  Weight: 234 lb 4.8 oz (106.3 kg)    Fetal Status:     Movement: Present     General:  Alert, oriented and cooperative. Patient is in no acute distress.  Skin: Skin is warm and dry. No rash noted.   Cardiovascular: Normal heart rate noted  Respiratory: Normal respiratory effort, no problems with respiration noted  Abdomen: Soft, gravid, appropriate for gestational age. Pain/Pressure: Present     Pelvic:  Cervical exam deferred      Right labia minora ulceration, tender.  Herpes Cx done  Extremities: Normal range of motion.  Edema: None  Mental Status: Normal mood and affect. Normal behavior. Normal judgment and thought content.   Urinalysis:      Assessment and Plan:  Pregnancy: G3P2002 at 2438w1d  1. Supervision of high risk pregnancy, antepartum Rx: - Cervicovaginal ancillary only - Strep Gp B NAA  2. Rh negative state in antepartum period - Rhogam given at 28 weeks  3. Poor fetal growth affecting management of mother, antepartum, single or unspecified fetus - follow up ultrasound at [facility] today   4. Ulcer of genital  labia - Herpes culture done Rx: - continue Valtrex bid - amoxicillin-clavulanate (AUGMENTIN) 875-125 MG tablet; Take 1 tablet by mouth 2 (two) times daily.  Dispense: 28 tablet; Refill: 2  Preterm labor symptoms and general obstetric precautions including but not limited to vaginal bleeding, contractions, leaking of fluid and fetal movement were reviewed in detail with the patient. Please refer to After Visit Summary for other counseling recommendations.  Return in about 1 week (around 11/17/2016) for ROB.   Brock BadHarper, Chasya Zenz A, MDPatient ID: Diamond Burton, female   DOB: May 01, 1986, 31 y.o.   MRN: [id number]

## 2016-11-10 NOTE — Progress Notes (Signed)
Patient reports good fetal movement and contractions that come and go with pressure. Pt complains of boils and vaginal irritation and wants STD testing.

## 2016-11-11 LAB — CERVICOVAGINAL ANCILLARY ONLY
Bacterial vaginitis: NEGATIVE
CHLAMYDIA, DNA PROBE: NEGATIVE
Candida vaginitis: NEGATIVE
Neisseria Gonorrhea: NEGATIVE
Trichomonas: NEGATIVE

## 2016-11-12 LAB — OB RESULTS CONSOLE GBS: STREP GROUP B AG: POSITIVE

## 2016-11-12 LAB — STREP GP B NAA: Strep Gp B NAA: POSITIVE — AB

## 2016-11-13 LAB — HERPES SIMPLEX VIRUS CULTURE

## 2016-11-17 ENCOUNTER — Encounter: Payer: Medicaid Other | Admitting: Obstetrics

## 2016-11-17 ENCOUNTER — Encounter (HOSPITAL_COMMUNITY): Payer: Self-pay

## 2016-11-17 ENCOUNTER — Ambulatory Visit (HOSPITAL_COMMUNITY)
Admission: RE | Admit: 2016-11-17 | Discharge: 2016-11-17 | Disposition: A | Discharge status: Home / Self Care | Payer: Medicaid Other | Source: Ambulatory Visit | Attending: Obstetrics | Admitting: Obstetrics

## 2016-11-17 DIAGNOSIS — O365990 Maternal care for other known or suspected poor fetal growth, unspecified trimester, not applicable or unspecified: Secondary | ICD-10-CM

## 2016-11-17 DIAGNOSIS — O365930 Maternal care for other known or suspected poor fetal growth, third trimester, not applicable or unspecified: Secondary | ICD-10-CM | POA: Diagnosis not present

## 2016-11-17 DIAGNOSIS — Z3689 Encounter for other specified antenatal screening: Secondary | ICD-10-CM | POA: Insufficient documentation

## 2016-11-17 DIAGNOSIS — Z3A38 38 weeks gestation of pregnancy: Secondary | ICD-10-CM | POA: Insufficient documentation

## 2016-11-21 ENCOUNTER — Encounter: Payer: Self-pay | Admitting: Obstetrics

## 2016-11-21 ENCOUNTER — Ambulatory Visit (INDEPENDENT_AMBULATORY_CARE_PROVIDER_SITE_OTHER): Payer: Medicaid Other | Admitting: Obstetrics

## 2016-11-21 VITALS — BP 117/72 | HR 86 | Wt 234.0 lb

## 2016-11-21 DIAGNOSIS — O365990 Maternal care for other known or suspected poor fetal growth, unspecified trimester, not applicable or unspecified: Secondary | ICD-10-CM

## 2016-11-21 DIAGNOSIS — O365993 Maternal care for other known or suspected poor fetal growth, unspecified trimester, fetus 3: Secondary | ICD-10-CM

## 2016-11-21 DIAGNOSIS — O0990 Supervision of high risk pregnancy, unspecified, unspecified trimester: Secondary | ICD-10-CM

## 2016-11-21 DIAGNOSIS — B3731 Acute candidiasis of vulva and vagina: Secondary | ICD-10-CM

## 2016-11-21 DIAGNOSIS — B373 Candidiasis of vulva and vagina: Secondary | ICD-10-CM

## 2016-11-21 DIAGNOSIS — O0993 Supervision of high risk pregnancy, unspecified, third trimester: Secondary | ICD-10-CM

## 2016-11-21 MED ORDER — TERCONAZOLE 0.8 % VA CREA: 1.0000 | TOPICAL_CREAM | Freq: Every day | VAGINAL | 0 refills | Status: DC

## 2016-11-21 NOTE — Progress Notes (Signed)
Patient is still itching, the Rx for the Yeast Infection did not work.

## 2016-11-21 NOTE — Progress Notes (Signed)
Subjective:  Diamond Burton is a 31 y.o. G3P2002 at 3251w5d being seen today for ongoing prenatal care.  She is currently monitored for the following issues for this high-risk pregnancy and has Hydradenitis; Supervision of high risk pregnancy, antepartum; History of herpes genitalis; Syphilis affecting pregnancy, antepartum; Rh negative state in antepartum period; and Obese on her problem list.  Patient reports no complaints.  Contractions: Not present. Vag. Bleeding: None.  Movement: Present. Denies leaking of fluid.   The following portions of the patient's history were reviewed and updated as appropriate: allergies, current medications, past family history, past medical history, past social history, past surgical history and problem list. Problem list updated.  Objective:   Vitals:   11/21/16 1331  BP: 117/72  Pulse: 86  Weight: 234 lb (106.1 kg)    Fetal Status: Fetal Heart Rate (bpm): 140   Movement: Present     General:  Alert, oriented and cooperative. Patient is in no acute distress.  Skin: Skin is warm and dry. No rash noted.   Cardiovascular: Normal heart rate noted  Respiratory: Normal respiratory effort, no problems with respiration noted  Abdomen: Soft, gravid, appropriate for gestational age. Pain/Pressure: Present     Pelvic:  Cervical exam deferred        Extremities: Normal range of motion.  Edema: None  Mental Status: Normal mood and affect. Normal behavior. Normal judgment and thought content.   Urinalysis:      Assessment and Plan:  Pregnancy: G3P2002 at 6551w5d 1. Supervision of high risk pregnancy, antepartum   2. Poor fetal growth affecting management of mother, antepartum, single or unspecified fetus.  AC = 8th%tile. Rx: - Fetal nonstress test:  Reactive.  FHR 140-150 bpm.  Good variability with 15x15 accels and no decels.  3. Candida vaginitis Rx: - terconazole (TERAZOL 3) 0.8 % vaginal cream; Place 1 applicator vaginally at bedtime.  Dispense: 20 g;  Refill: 0  There are no diagnoses linked to this encounter. Term labor symptoms and general obstetric precautions including but not limited to vaginal bleeding, contractions, leaking of fluid and fetal movement were reviewed in detail with the patient. Please refer to After Visit Summary for other counseling recommendations.  IOL scheduled on 11-28-16, at 39-.5 weeks for growth restriction   Brock BadHarper, Charles A, MDPatient ID: Diamond Sireniarra Welcher, female   DOB: 05/09/1986, 31 y.o.   MRN: [id number]   Patient ID: Diamond Sireniarra Eckhardt, female   DOB: 05/09/1986, 31 y.o.   MRN: [id number]

## 2016-11-22 ENCOUNTER — Telehealth (HOSPITAL_COMMUNITY): Payer: Self-pay | Admitting: *Deleted

## 2016-11-22 NOTE — Telephone Encounter (Signed)
Preadmission screen  

## 2016-11-24 ENCOUNTER — Ambulatory Visit (HOSPITAL_COMMUNITY): Admission: RE | Admit: 2016-11-24 | Payer: Medicaid Other | Source: Ambulatory Visit

## 2016-11-28 ENCOUNTER — Inpatient Hospital Stay (HOSPITAL_COMMUNITY): Payer: Medicaid Other | Admitting: Anesthesiology

## 2016-11-28 ENCOUNTER — Encounter (HOSPITAL_COMMUNITY): Payer: Self-pay

## 2016-11-28 ENCOUNTER — Inpatient Hospital Stay (HOSPITAL_COMMUNITY)
Admission: RE | Admit: 2016-11-28 | Discharge: 2016-11-30 | DRG: 774 | Disposition: A | Discharge status: Home / Self Care | Payer: Medicaid Other | Source: Ambulatory Visit | Attending: Family Medicine | Admitting: Family Medicine

## 2016-11-28 DIAGNOSIS — Z6839 Body mass index (BMI) 39.0-39.9, adult: Secondary | ICD-10-CM | POA: Diagnosis not present

## 2016-11-28 DIAGNOSIS — Z87898 Personal history of other specified conditions: Secondary | ICD-10-CM | POA: Diagnosis present

## 2016-11-28 DIAGNOSIS — A6000 Herpesviral infection of urogenital system, unspecified: Secondary | ICD-10-CM | POA: Diagnosis present

## 2016-11-28 DIAGNOSIS — O365930 Maternal care for other known or suspected poor fetal growth, third trimester, not applicable or unspecified: Principal | ICD-10-CM | POA: Diagnosis present

## 2016-11-28 DIAGNOSIS — O9832 Other infections with a predominantly sexual mode of transmission complicating childbirth: Secondary | ICD-10-CM | POA: Diagnosis present

## 2016-11-28 DIAGNOSIS — Z87891 Personal history of nicotine dependence: Secondary | ICD-10-CM | POA: Diagnosis not present

## 2016-11-28 DIAGNOSIS — O99824 Streptococcus B carrier state complicating childbirth: Secondary | ICD-10-CM | POA: Diagnosis present

## 2016-11-28 DIAGNOSIS — Z3A39 39 weeks gestation of pregnancy: Secondary | ICD-10-CM

## 2016-11-28 DIAGNOSIS — O99214 Obesity complicating childbirth: Secondary | ICD-10-CM | POA: Diagnosis present

## 2016-11-28 LAB — CBC
HEMATOCRIT: 39.8 % (ref 36.0–46.0)
HEMOGLOBIN: 13.7 g/dL (ref 12.0–15.0)
MCH: 30.7 pg (ref 26.0–34.0)
MCHC: 34.4 g/dL (ref 30.0–36.0)
MCV: 89.2 fL (ref 78.0–100.0)
Platelets: 261 10*3/uL (ref 150–400)
RBC: 4.46 MIL/uL (ref 3.87–5.11)
RDW: 13.9 % (ref 11.5–15.5)
WBC: 9.9 10*3/uL (ref 4.0–10.5)

## 2016-11-28 MED ORDER — LIDOCAINE HCL (PF) 1 % IJ SOLN
INTRAMUSCULAR | Status: DC | PRN
  Administered 2016-11-28 (×2): 7 mL via EPIDURAL

## 2016-11-28 MED ORDER — LACTATED RINGERS IV SOLN: 500.0000 mL | INTRAVENOUS | Status: DC | PRN

## 2016-11-28 MED ORDER — ACETAMINOPHEN 325 MG PO TABS
650.0000 mg | ORAL_TABLET | ORAL | Status: DC | PRN
  Administered 2016-11-28: 650 mg via ORAL
  Filled 2016-11-28: qty 2

## 2016-11-28 MED ORDER — LACTATED RINGERS IV SOLN: 500.0000 mL | Freq: Once | INTRAVENOUS | Status: AC

## 2016-11-28 MED ORDER — FENTANYL 2.5 MCG/ML BUPIVACAINE 1/10 % EPIDURAL INFUSION (WH - ANES)
14.0000 mL/h | INTRAMUSCULAR | Status: DC | PRN
  Administered 2016-11-28 (×2): 14 mL/h via EPIDURAL
  Filled 2016-11-28 (×2): qty 100

## 2016-11-28 MED ORDER — OXYTOCIN 40 UNITS IN LACTATED RINGERS INFUSION - SIMPLE MED
2.5000 [IU]/h | INTRAVENOUS | Status: DC
  Filled 2016-11-28: qty 1000

## 2016-11-28 MED ORDER — OXYCODONE-ACETAMINOPHEN 5-325 MG PO TABS: 2.0000 | ORAL_TABLET | ORAL | Status: DC | PRN

## 2016-11-28 MED ORDER — OXYTOCIN 40 UNITS IN LACTATED RINGERS INFUSION - SIMPLE MED
1.0000 m[IU]/min | INTRAVENOUS | Status: DC
  Administered 2016-11-28: 2 m[IU]/min via INTRAVENOUS

## 2016-11-28 MED ORDER — PHENYLEPHRINE 40 MCG/ML (10ML) SYRINGE FOR IV PUSH (FOR BLOOD PRESSURE SUPPORT)
80.0000 ug | PREFILLED_SYRINGE | INTRAVENOUS | Status: DC | PRN
  Filled 2016-11-28: qty 5
  Filled 2016-11-28: qty 10

## 2016-11-28 MED ORDER — DIPHENHYDRAMINE HCL 50 MG/ML IJ SOLN: 12.5000 mg | INTRAMUSCULAR | Status: DC | PRN

## 2016-11-28 MED ORDER — PENICILLIN G POTASSIUM 5000000 UNITS IJ SOLR
5.0000 10*6.[IU] | Freq: Once | INTRAVENOUS | Status: AC
  Administered 2016-11-28: 5 10*6.[IU] via INTRAVENOUS
  Filled 2016-11-28: qty 5

## 2016-11-28 MED ORDER — OXYTOCIN BOLUS FROM INFUSION
500.0000 mL | Freq: Once | INTRAVENOUS | Status: AC
  Administered 2016-11-29: 500 mL via INTRAVENOUS

## 2016-11-28 MED ORDER — FENTANYL CITRATE (PF) 100 MCG/2ML IJ SOLN
100.0000 ug | INTRAMUSCULAR | Status: DC | PRN
  Filled 2016-11-28: qty 2

## 2016-11-28 MED ORDER — EPHEDRINE 5 MG/ML INJ
10.0000 mg | INTRAVENOUS | Status: DC | PRN
  Filled 2016-11-28: qty 2

## 2016-11-28 MED ORDER — LACTATED RINGERS IV SOLN
INTRAVENOUS | Status: DC
  Administered 2016-11-28: 125 mL via INTRAUTERINE

## 2016-11-28 MED ORDER — LACTATED RINGERS IV SOLN
INTRAVENOUS | Status: DC
  Administered 2016-11-28 – 2016-11-29 (×3): via INTRAVENOUS

## 2016-11-28 MED ORDER — TERBUTALINE SULFATE 1 MG/ML IJ SOLN
0.2500 mg | Freq: Once | INTRAMUSCULAR | Status: DC | PRN
  Filled 2016-11-28: qty 1

## 2016-11-28 MED ORDER — LIDOCAINE HCL (PF) 1 % IJ SOLN
30.0000 mL | INTRAMUSCULAR | Status: DC | PRN
  Filled 2016-11-28: qty 30

## 2016-11-28 MED ORDER — FLEET ENEMA 7-19 GM/118ML RE ENEM: 1.0000 | ENEMA | RECTAL | Status: DC | PRN

## 2016-11-28 MED ORDER — SOD CITRATE-CITRIC ACID 500-334 MG/5ML PO SOLN
30.0000 mL | ORAL | Status: DC | PRN
  Filled 2016-11-28: qty 15

## 2016-11-28 MED ORDER — MISOPROSTOL 25 MCG QUARTER TABLET
25.0000 ug | ORAL_TABLET | ORAL | Status: DC
  Administered 2016-11-28: 25 ug via VAGINAL
  Filled 2016-11-28 (×4): qty 1

## 2016-11-28 MED ORDER — OXYCODONE-ACETAMINOPHEN 5-325 MG PO TABS: 1.0000 | ORAL_TABLET | ORAL | Status: DC | PRN

## 2016-11-28 MED ORDER — ONDANSETRON HCL 4 MG/2ML IJ SOLN
4.0000 mg | Freq: Four times a day (QID) | INTRAMUSCULAR | Status: DC | PRN
  Administered 2016-11-28: 4 mg via INTRAVENOUS
  Filled 2016-11-28: qty 2

## 2016-11-28 MED ORDER — PENICILLIN G POT IN DEXTROSE 60000 UNIT/ML IV SOLN
3.0000 10*6.[IU] | INTRAVENOUS | Status: DC
  Administered 2016-11-28 (×4): 3 10*6.[IU] via INTRAVENOUS
  Filled 2016-11-28 (×7): qty 50

## 2016-11-28 MED ORDER — PHENYLEPHRINE 40 MCG/ML (10ML) SYRINGE FOR IV PUSH (FOR BLOOD PRESSURE SUPPORT)
80.0000 ug | PREFILLED_SYRINGE | INTRAVENOUS | Status: DC | PRN
  Filled 2016-11-28: qty 5

## 2016-11-28 NOTE — Anesthesia Preprocedure Evaluation (Signed)
Anesthesia Evaluation  Patient identified by MRN, date of birth, ID band Patient awake    Reviewed: Allergy & Precautions, H&P , NPO status , Patient's Chart, lab work & pertinent test results  Airway Mallampati: II  TM Distance: >3 FB Neck ROM: full    Dental no notable dental hx. (+) Teeth Intact   Pulmonary former smoker,    Pulmonary exam normal breath sounds clear to auscultation       Cardiovascular Normal cardiovascular exam Rhythm:regular Rate:Normal     Neuro/Psych    GI/Hepatic negative GI ROS, Neg liver ROS,   Endo/Other  Morbid obesity  Renal/GU   negative genitourinary   Musculoskeletal negative musculoskeletal ROS (+)   Abdominal (+) + obese,   Peds  Hematology   Anesthesia Other Findings       Reproductive/Obstetrics (+) Pregnancy                             Anesthesia Physical  Anesthesia Plan  ASA: III  Anesthesia Plan: Epidural   Post-op Pain Management:    Induction:   PONV Risk Score and Plan:   Airway Management Planned:   Additional Equipment:   Intra-op Plan:   Post-operative Plan:   Informed Consent: I have reviewed the patients History and Physical, chart, labs and discussed the procedure including the risks, benefits and alternatives for the proposed anesthesia with the patient or authorized representative who has indicated his/her understanding and acceptance.   Dental Advisory Given  Plan Discussed with:   Anesthesia Plan Comments: ( )        Anesthesia Quick Evaluation

## 2016-11-28 NOTE — Anesthesia Procedure Notes (Signed)
Epidural Patient location during procedure: OB Start time: 11/28/2016 2:56 PM End time: 11/28/2016 3:00 PM  Staffing Anesthesiologist: Leilani AbleHATCHETT, Irianna Gilday Performed: anesthesiologist   Preanesthetic Checklist Completed: patient identified, surgical consent, pre-op evaluation, timeout performed, IV checked, risks and benefits discussed and monitors and equipment checked  Epidural Patient position: sitting Prep: site prepped and draped and DuraPrep Patient monitoring: continuous pulse ox and blood pressure Approach: midline Location: L3-L4 Injection technique: LOR air  Needle:  Needle type: Tuohy  Needle gauge: 17 G Needle length: 9 cm and 9 Needle insertion depth: 9 cm Catheter type: closed end flexible Catheter size: 19 Gauge Catheter at skin depth: 15 cm Test dose: negative  Assessment Sensory level: T9 Events: blood not aspirated, injection not painful, no injection resistance, negative IV test and no paresthesia  Additional Notes Reason for block:procedure for pain

## 2016-11-28 NOTE — Anesthesia Pain Management Evaluation Note (Signed)
  CRNA Pain Management Visit Note  Patient: Diamond Burton, 31 y.o., female  "Hello I am a member of the anesthesia team at [facility]. We have an anesthesia team available at all times to provide care throughout the hospital, including epidural management and anesthesia for C-section. I don't know your plan for the delivery whether it a natural birth, water birth, IV sedation, nitrous supplementation, doula or epidural, but we want to meet your pain goals."   1.Was your pain managed to your expectations on prior hospitalizations?   Yes   2.What is your expectation for pain management during this hospitalization?     Epidural  3.How can we help you reach that goal? epidural  Record the patient's initial score and the patient's pain goal.   Pain: 1  Pain Goal: 8 [facility] wants you to be able to say your pain was always managed very well.  Carson Meche 11/28/2016

## 2016-11-28 NOTE — H&P (Signed)
Diamond Burton is a 31 y.o. female G3P2002 at 39.5 weeks presenting for induction of labor for growth restriction. Her last ultrasound showed an EFW at 37% and AC at 8% tile with normal doppler studies. She denies contractions, vaginal bleeding or leaking of fluid. She has a history of herpes, on prophylaxis, and was [facility] and had titers followed throughout the pregnancy.   Clinic   [facility] Prenatal Labs  Dating    LMP, consistent with 19 wk us Blood type: B/Negative/-- (12/20 1405)   Genetic Screen 1 Screen:    AFP: nml    MAT21, neg, female Antibody:Negative (04/10 1040)  Anatomic US  Nml @ 19 wks, female Rubella: 2.30 (12/20 1405)  GTT Early:               Third trimester:  RPR: Reactive (04/10 1040)   Flu vaccine    Decline HBsAg: Negative (12/20 1405)   TDaP vaccine  declines                                             Rhogam: HIV: Non Reactive (04/10 1040)   Baby Food        Bottle                                       UUV:[id number]:Positive (05/31 1203)(For PCN allergy, check sensitivities)  Contraception        unsure Pap:06/01/16: normal  Circumcision yes   Pediatrician        [facility]   Support Person         None    OB History    Gravida Para Term Preterm AB Living   3 2 2     2    SAB TAB Ectopic Multiple Live Births           2     Past Medical History:  Diagnosis Date  . Abnormal Pap smear   . Depression    while pregnant was on zoloft but now counseling  . Eczema   . Erb's palsy    L arm  . Erb's palsy    left arm  . Herpes    frequent outbreaks on valtrex 500  . History of chlamydia   . History of gonorrhea   . Hydradenitis   . Infection    UTI  . Kidney stones   . Ovarian cyst   . Periodontal disease   . PID (pelvic inflammatory disease)    Past Surgical History:  Procedure Laterality Date  . NO PAST SURGERIES     Family History: family history includes Asthma in her daughter and sister; Depression in her mother;  Diabetes in her maternal grandmother and mother; Heart disease in her mother; Hypertension in her maternal grandmother and mother. Social History:  reports that she has quit smoking. Her smoking use included Cigarettes. She smoked 0.50 packs per day. She has never used smokeless tobacco. She reports that she does not drink alcohol or use drugs.    Maternal Diabetes: No Genetic Screening: Normal Maternal Ultrasounds/Referrals: Abnormal:  Findings:   Other: growth restriction Fetal Ultrasounds or other Referrals:  None Maternal Substance Abuse:  No Significant Maternal Medications:  None Significant Maternal Lab Results:  Lab values  include: Group B Strep positive Other Comments:  None  Review of Systems  Constitutional: Negative.  Negative for chills and fever.  Eyes: Negative.  Negative for blurred vision.  Respiratory: Negative.  Negative for shortness of breath.   Cardiovascular: Negative.  Negative for chest pain.  Gastrointestinal: Negative.  Negative for abdominal pain.  Genitourinary: Negative.   Neurological: Negative.  Negative for headaches.   Maternal Medical History:  Reason for admission: Induction of labor  Contractions: Frequency: irregular.   Duration is approximately 40 seconds.   Perceived severity is mild.    Fetal activity: Perceived fetal activity is normal.   Last perceived fetal movement was within the past hour.    Prenatal complications: IUGR.   Prenatal Complications - Diabetes: none.    Dilation: Closed Effacement (%): 50 Station: Ballotable Exam by:: Cleone Slim, SNM Last menstrual period 02/24/2016. Maternal Exam:  Uterine Assessment: Contraction strength is mild.  Contraction duration is 40 seconds. Contraction frequency is irregular.   Abdomen: Estimated fetal weight is 6lbs on 5/31.   Fetal presentation: vertex  Introitus: Ferning test: not done.  Nitrazine test: not done. Amniotic fluid character: not assessed.  Cervix: Cervix  evaluated by digital exam.     Fetal Exam Fetal Monitor Review: Mode: ultrasound.   Baseline rate: 120.  Variability: moderate (6-25 bpm).   Pattern: accelerations present and no decelerations.    Fetal State Assessment: Category I - tracings are normal.     Physical Exam  Nursing note and vitals reviewed. Constitutional: She is oriented to person, place, and time. She appears well-developed and well-nourished.  HENT:  Head: Normocephalic and atraumatic.  Eyes: Conjunctivae are normal. No scleral icterus.  Cardiovascular: Normal rate, regular rhythm and normal heart sounds.   Respiratory: Effort normal and breath sounds normal. No respiratory distress.  GI: Soft. There is no tenderness.  Neurological: She is alert and oriented to person, place, and time.  Skin: Skin is warm and dry.  Psychiatric: She has a normal mood and affect. Her behavior is normal. Judgment and thought content normal.    Prenatal labs: ABO, Rh: B/Negative/-- (12/20 1405) Antibody: Negative (04/10 1040) Rubella: 2.30 (12/20 1405) RPR: Reactive (04/10 1040)  HBsAg: Negative (12/20 1405)  HIV: Non Reactive (04/10 1040)  GBS: Positive (05/31 1203)   Assessment/Plan: IUP at term. IOL for growth restriction. GBS pos  Admit to birthing suites. Cytotec for cervical ripening.  PCN for GBS prophylaxis when active.  Patient plans epidural for pain management.  Anticipate NSVD   Cleone Slim SNM 11/28/2016, 7:56 AM  I confirm that I have verified the information documented in the nurse midwife student's note and that I have also personally reperformed the physical exam and all medical decision making activities.   Thressa Sheller 8:01 AM

## 2016-11-28 NOTE — Progress Notes (Signed)
Patient ID: Diamond Burton, female   DOB: 05-Apr-1986, 31 y.o.   MRN: [id number]  S: Patient seen & examined. Patient comfortable with epidural that was just placed. RN called to confirm presentation, as she could not feel presenting part on vaginal exam.    O:  Vitals:   11/28/16 1257 11/28/16 1340 11/28/16 1345 11/28/16 1424  BP: 123/72 135/68    Pulse: 71 71    Resp:  18    Temp:    98.3 F (36.8 C)  TempSrc:    Oral  SpO2:  99% 100%   Weight:      Height:        Dilation: 2 Effacement (%): 60, 70 Cervical Position: Posterior Station: -3 Presentation: Vertex Exam by:: Mumaw  Vertex felt. Bedside ultrasound performed, confirmed presentation as cephalic. Anterior- Right lateral placenta noted on BSUS.  FHT: 130bpm, mod var, +accels, a couple variable decels with return to baseline IUPC: <200 MVU, q5-297min (about 20 MVU each contraction)  FSE replaced due to fell off during epidural IUPC placed with ease Light meconium- blood tinged fluid returning  A/P: When reactive FHT, will start pitocin 1x1 Continue expectant management Anticipate SVD but guarded

## 2016-11-28 NOTE — Progress Notes (Signed)
Patient ID: Diamond Burton, female   DOB: 11/06/1985, 31 y.o.   MRN: [id number]  S: Patient seen & examined for prolonged deceleration. Patient had position changes and oxygen mask on, pitocin was turned off (only at 2 milliunits, had been on for only 10 minutes after starting).     O:  Vitals:   11/28/16 1113 11/28/16 1130 11/28/16 1206 11/28/16 1218  BP: (!) 110/57 132/73  (!) 152/79  Pulse: 91 81  74  Resp: 18 19  18   Temp:   98.1 F (36.7 C)   TempSrc:   Oral   Weight:      Height:        Dilation: 2 Effacement (%): 70 Cervical Position: Posterior Station: -3 Presentation: Vertex Exam by:: Aldora Perman  Prior to decel, FHT 130 bpm, mod var, +accels, no decels then went into a 12 minute prolonged decel nadir in 60s, with return to baseline, new baseline of 120 bpm moderate variability, a single accel (15x15).    FB removed. FSE placed.   TOCO: Occasional  A/P: Pitocin off FSE placed IUPC will be placed before restarting pitocin in over 1 hour if baby is reactive Guarded prognosis for vaginal delivery

## 2016-11-29 ENCOUNTER — Encounter (HOSPITAL_COMMUNITY): Payer: Self-pay

## 2016-11-29 DIAGNOSIS — O99824 Streptococcus B carrier state complicating childbirth: Secondary | ICD-10-CM

## 2016-11-29 DIAGNOSIS — Z3A39 39 weeks gestation of pregnancy: Secondary | ICD-10-CM

## 2016-11-29 LAB — RPR: RPR: REACTIVE — AB

## 2016-11-29 LAB — RPR, QUANT+TP ABS (REFLEX)
Rapid Plasma Reagin, Quant: 1:1 {titer} — ABNORMAL HIGH
T Pallidum Abs: POSITIVE — AB

## 2016-11-29 MED ORDER — DIPHENHYDRAMINE HCL 25 MG PO CAPS: 25.0000 mg | ORAL_CAPSULE | Freq: Four times a day (QID) | ORAL | Status: DC | PRN

## 2016-11-29 MED ORDER — SIMETHICONE 80 MG PO CHEW: 80.0000 mg | CHEWABLE_TABLET | ORAL | Status: DC | PRN

## 2016-11-29 MED ORDER — ONDANSETRON HCL 4 MG/2ML IJ SOLN: 4.0000 mg | INTRAMUSCULAR | Status: DC | PRN

## 2016-11-29 MED ORDER — DIBUCAINE 1 % RE OINT: 1.0000 "application " | TOPICAL_OINTMENT | RECTAL | Status: DC | PRN

## 2016-11-29 MED ORDER — ACETAMINOPHEN 325 MG PO TABS
650.0000 mg | ORAL_TABLET | ORAL | Status: DC | PRN
  Filled 2016-11-29: qty 2

## 2016-11-29 MED ORDER — TETANUS-DIPHTH-ACELL PERTUSSIS 5-2.5-18.5 LF-MCG/0.5 IM SUSP
0.5000 mL | Freq: Once | INTRAMUSCULAR | Status: AC
  Administered 2016-11-30: 0.5 mL via INTRAMUSCULAR
  Filled 2016-11-29: qty 0.5

## 2016-11-29 MED ORDER — IBUPROFEN 600 MG PO TABS
600.0000 mg | ORAL_TABLET | Freq: Four times a day (QID) | ORAL | Status: DC
  Administered 2016-11-29 – 2016-11-30 (×6): 600 mg via ORAL
  Filled 2016-11-29 (×6): qty 1

## 2016-11-29 MED ORDER — SODIUM CHLORIDE 0.9 % IV SOLN: 250.0000 mL | INTRAVENOUS | Status: DC | PRN

## 2016-11-29 MED ORDER — ZOLPIDEM TARTRATE 5 MG PO TABS: 5.0000 mg | ORAL_TABLET | Freq: Every evening | ORAL | Status: DC | PRN

## 2016-11-29 MED ORDER — BENZOCAINE-MENTHOL 20-0.5 % EX AERO
1.0000 "application " | INHALATION_SPRAY | CUTANEOUS | Status: DC | PRN
  Administered 2016-11-29 – 2016-11-30 (×2): 1 via TOPICAL
  Filled 2016-11-29 (×2): qty 56

## 2016-11-29 MED ORDER — OXYTOCIN 40 UNITS IN LACTATED RINGERS INFUSION - SIMPLE MED: 2.5000 [IU]/h | INTRAVENOUS | Status: DC | PRN

## 2016-11-29 MED ORDER — WITCH HAZEL-GLYCERIN EX PADS: 1.0000 "application " | MEDICATED_PAD | CUTANEOUS | Status: DC | PRN

## 2016-11-29 MED ORDER — PRENATAL MULTIVITAMIN CH
1.0000 | ORAL_TABLET | Freq: Every day | ORAL | Status: DC
  Administered 2016-11-29 – 2016-11-30 (×2): 1 via ORAL
  Filled 2016-11-29 (×2): qty 1

## 2016-11-29 MED ORDER — SODIUM CHLORIDE 0.9% FLUSH: 3.0000 mL | INTRAVENOUS | Status: DC | PRN

## 2016-11-29 MED ORDER — SODIUM CHLORIDE 0.9% FLUSH: 3.0000 mL | Freq: Two times a day (BID) | INTRAVENOUS | Status: DC

## 2016-11-29 MED ORDER — SENNOSIDES-DOCUSATE SODIUM 8.6-50 MG PO TABS
2.0000 | ORAL_TABLET | ORAL | Status: DC
  Administered 2016-11-29: 2 via ORAL
  Filled 2016-11-29: qty 2

## 2016-11-29 MED ORDER — ONDANSETRON HCL 4 MG PO TABS: 4.0000 mg | ORAL_TABLET | ORAL | Status: DC | PRN

## 2016-11-29 MED ORDER — COCONUT OIL OIL: 1.0000 "application " | TOPICAL_OIL | Status: DC | PRN

## 2016-11-29 NOTE — Anesthesia Postprocedure Evaluation (Signed)
Anesthesia Post Note  Patient: Diamond Burton  Procedure(s) Performed: * No procedures listed *     Patient location during evaluation: Mother Baby Anesthesia Type: Epidural Level of consciousness: awake and alert and oriented Pain management: satisfactory to patient Vital Signs Assessment: post-procedure vital signs reviewed and stable Respiratory status: spontaneous breathing and nonlabored ventilation Cardiovascular status: stable Postop Assessment: no headache, no backache, no signs of nausea or vomiting, adequate PO intake and patient able to bend at knees (patient up walking) Anesthetic complications: no    Last Vitals:  Vitals:   11/29/16 0720 11/29/16 1200  BP: 124/66 129/63  Pulse: 85 82  Resp:  20  Temp: 37.1 C 36.8 C    Last Pain:  Vitals:   11/29/16 1200  TempSrc: Oral  PainSc:    Pain Goal: Patients Stated Pain Goal: 8 (11/28/16 0825)               Madison HickmanGREGORY,Yarnell Arvidson

## 2016-11-29 NOTE — Plan of Care (Signed)
Problem: Education: Goal: Knowledge of condition will improve Outcome: Completed/Met Date Met: 11/29/16 Discussed skin to skin benefits, bleeding and bladder care; answered all of patient's questions.     

## 2016-11-30 MED ORDER — IBUPROFEN 600 MG PO TABS: 600.0000 mg | ORAL_TABLET | Freq: Four times a day (QID) | ORAL | 2 refills | Status: DC

## 2016-11-30 MED ORDER — RHO D IMMUNE GLOBULIN 1500 UNIT/2ML IJ SOSY
300.0000 ug | PREFILLED_SYRINGE | Freq: Once | INTRAMUSCULAR | Status: AC
  Administered 2016-11-30: 300 ug via INTRAMUSCULAR
  Filled 2016-11-30: qty 2

## 2016-11-30 NOTE — Discharge Summary (Signed)
OB Discharge Summary     Patient Name: Diamond Burton DOB: [birth year] MRN: [id number]  Date of admission: 11/28/2016 Delivering MD: Michaele Offer   Date of discharge: 11/30/2016  Admitting diagnosis: 39wks induction Intrauterine pregnancy: [redacted]w[redacted]d     Secondary diagnosis:  Active Problems:   Pregnancy affected by fetal growth restriction  Additional problems: HSV, Syphilis HX, Obesity, HS     Discharge diagnosis: Term Pregnancy Delivered and operative vaginal delivery                                                                                                Post partum procedures:rhogam  Augmentation: AROM and Pitocin  Complications: None  Hospital course:  Induction of Labor With Vaginal Delivery   31 y.o. yo G3P3003 at [redacted]w[redacted]d was admitted to the hospital 11/28/2016 for induction of labor.  Indication for induction: IUGR.  Patient had an uncomplicated labor course as follows: Membrane Rupture Time/Date: 1:05 PM ,11/28/2016   Intrapartum Procedures: Episiotomy: None [1]                                         Lacerations:  2nd degree [3]  Patient had delivery of a Viable infant.  Information for the patient's newborn:  Kharis, Lapenna Boy Gyselle [[id number]]  Delivery Method: Vaginal, Vacuum (Extractor) (Filed from Delivery Summary)   11/29/2016  Details of delivery can be found in separate delivery note.  Patient had a routine postpartum course. Patient is discharged home 11/30/16.  Physical exam  Vitals:   11/29/16 0720 11/29/16 1200 11/29/16 2012 11/30/16 0552  BP: 124/66 129/63 (!) 104/51 132/70  Pulse: 85 82 70 72  Resp:  20 18 18   Temp: 98.7 F (37.1 C) 98.3 F (36.8 C)  98.7 F (37.1 C)  TempSrc: Oral Oral  Oral  SpO2:      Weight:      Height:       General: alert, cooperative and no distress Lochia: appropriate Uterine Fundus: firm Incision: N/A DVT Evaluation: No evidence of DVT seen on physical exam. No cords or calf tenderness. No  significant calf/ankle edema. Labs: Lab Results  Component Value Date   WBC 9.9 11/28/2016   HGB 13.7 11/28/2016   HCT 39.8 11/28/2016   MCV 89.2 11/28/2016   PLT 261 11/28/2016   CMP Latest Ref Rng & Units 07/19/2015  Glucose 65 - 99 mg/dL 79  BUN 6 - 20 mg/dL 10  Creatinine 5.78 - 4.69 mg/dL 6.29  Sodium 528 - 413 mmol/L 135  Potassium 3.5 - 5.1 mmol/L 3.9  Chloride 101 - 111 mmol/L 101  CO2 22 - 32 mmol/L 21(L)  Calcium 8.9 - 10.3 mg/dL 9.3  Total Protein 6.5 - 8.1 g/dL 8.1  Total Bilirubin 0.3 - 1.2 mg/dL 0.7  Alkaline Phos 38 - 126 U/L 80  AST 15 - 41 U/L 21  ALT 14 - 54 U/L 16    Discharge instruction: per After Visit Summary and "Baby and Me Booklet".  After visit  meds:  Allergies as of 11/30/2016   No Known Allergies     Medication List    STOP taking these medications   amoxicillin-clavulanate 875-125 MG tablet Commonly known as:  AUGMENTIN   valACYclovir 1000 MG tablet Commonly known as:  VALTREX     TAKE these medications   clotrimazole 1 % cream Commonly known as:  LOTRIMIN Apply 1 application topically 2 (two) times daily.   ibuprofen 600 MG tablet Commonly known as:  ADVIL,MOTRIN Take 1 tablet (600 mg total) by mouth every 6 (six) hours.   PRENATE MINI 18-0.6-0.4-350 MG Caps Take 1 capsule by mouth daily.   terconazole 0.8 % vaginal cream Commonly known as:  TERAZOL 3 Place 1 applicator vaginally at bedtime.       Diet: routine diet  Activity: Advance as tolerated. Pelvic rest for 6 weeks.   Outpatient follow up:4 weeks Follow up Appt:No future appointments. Follow up Visit:No Follow-up on file.  Postpartum contraception: Combination OCPs and abstinence  Newborn Data: Live born female  Birth Weight: 5 lb 11 oz (2580 g) APGAR: 8, 9  Baby Feeding: Bottle Disposition:home with mother   11/30/2016 Roe Coombsachelle A Yuriana Gaal, CNM

## 2016-11-30 NOTE — Clinical Social Work Maternal (Signed)
CLINICAL SOCIAL WORK MATERNAL/CHILD NOTE  Patient Details  Name: Diamond Burton MRN: [id number] Date of Birth: 01/14/1986  Date:  11/30/2016  Clinical Social Worker Initiating Note:  Terri Piedra, LCSW Date/ Time Initiated:  11/30/16/1100     Child's Name:  Tristen Stamas   Legal Guardian:      Need for Interpreter:  None   Date of Referral:  11/30/16     Reason for Referral:  Current Substance Use/Substance Use During Pregnancy , Other (Comment) (Hx Dep)   Referral Source:  Central Nursery   Address:  [address] [place], [place] [address]  Phone number:  [phone]   Household Members:  Minor Children, Significant Other (MOB has two daughters at home, ages 105 and 76.  FOB/Charles Kennon Rounds also lives in the home.)   Natural Supports (not living in the home):  Immediate Family   Professional Supports: None   Employment:     Type of Work: FOB works for [employer]   Education:      Museum/gallery curator Resources:  [employer]   Other Resources:  Physicist, medical  (MOB plans to apply for [facility])   Cultural/Religious Considerations Which May Impact Care: None stated.    Strengths:  Ability to meet basic needs , Pediatrician chosen , Home prepared for child  (MOB's children currently have pediatric follow up at [facility], but wishes to have all three of her children go to [facility]  )   Risk Factors/Current Problems:  Substance Use  (marijuana use)   Cognitive State:  Able to Concentrate , Alert , Linear Thinking    Mood/Affect:  Comfortable , Calm , Interested    CSW Assessment: CSW met with MOB and FOB in MOB's first floor room/105 to offer support and complete assessment due to hx of depression and marijuana use.  Parents were pleasant and receptive of CSW's visit.  MOB gave consent to speak openly with FOB present.   MOB reports that this is her 3rd child, and that she does not wish to have any more children due to her experience with this labor and delivery.  She  described it as "long, painful, and scary."  She is "excited" about her baby and reports this is her first boy.  FOB has 10 other children, whom he states are "grown."  FOB appears much older than MOB.  They state they have been together for 4 years.  MOB told CSW "he wants to get married, but I'm not ready."  She states FOB is from [place], but currently lives in the home with her and her two daughters.  Her two children are being cared for by her sister while she is in the hospital.  She reports that her sister is a good support.  MOB states she has all necessary supplies for infant at home and is aware of SIDS precautions as reviewed by CSW.  She states that she knows about SIDS and takes the precautions very seriously, which CSW commends. MOB states she felt depressed during pregnancy due to feeling "fat and out of shape."  She described these depressive symptoms as very different from her past experiences with depression because her symptoms were not brought on by someone else.  She reports that she felt depressed during and after her first pregnancy because her mother passed away when she was 2 months pregnant.  She reports depression after her second pregnancy because of issues with that child's father.  She reports feeling well emotionally at this time and does  not feel she needs any mental health treatment at this time.  She reports receiving counseling approximately 10 years ago and taking an antidepressant as a teenager.  She was engaged and attentive to education regarding PMADs provided by CSW.  CSW encouraged her to utilize the New Mom Checklist from Postpartum Progress to self-evaluate during the postpartum time period and speak with a medical professional if she identifies concerns at any time.  MOB reports that she reads the Bible to cope with stress and that "it helps me every time."  She added that she believes God will not give her more than she can handle. CSW also inquired about substance use and  informed MOB of baby's positive UDS for THC.  MOB was understanding of hospital drug screen policy and mandated reporting.  She admits to smoking marijuana occasionally to increase appetite and decrease nausea.  She denies all other substance use.  She reports that she has had involvement with CPS in the past due to calls made by her younger daughter's father, but state she has never had a case substantiated.  CSW made report to [facility] CPS.  CPS involvement due to marijuana use should not delay baby's discharge when medically ready as CPS can follow up with parents in the home.  CSW will monitor CDS results and update CPS worker as needed.  CSW Plan/Description:  Child Protective Service Report , Information/Referral to [employer] , No Further Intervention Required/No Barriers to Discharge, Patient/Family Education     Alphonzo Cruise, [place] 11/30/2016, 1:13 PM

## 2016-11-30 NOTE — Progress Notes (Signed)
Post Partum Day #1 Subjective: no complaints, up ad lib, voiding and tolerating PO  Objective: Blood pressure 132/70, pulse 72, temperature 98.7 F (37.1 C), temperature source Oral, resp. rate 18, height 5\' 5"  (1.651 m), weight 234 lb (106.1 kg), last menstrual period 02/24/2016, SpO2 99 %, unknown if currently breastfeeding.  Physical Exam:  General: alert, cooperative and no distress Lochia: appropriate Uterine Fundus: firm Incision: no significant drainage, no dehiscence, no significant erythema DVT Evaluation: No evidence of DVT seen on physical exam. No cords or calf tenderness. No significant calf/ankle edema.   Recent Labs  11/28/16 0811  HGB 13.7  HCT 39.8    Assessment/Plan: Discharge home and Contraception OCPs   LOS: 2 days   Roe CoombsRachelle A Ezekeil Bethel, CNM 11/30/2016, 8:51 AM

## 2016-11-30 NOTE — Discharge Instructions (Signed)

## 2016-12-01 LAB — RH IG WORKUP (INCLUDES ABO/RH)
ABO/RH(D): B NEG
FETAL SCREEN: NEGATIVE
Gestational Age(Wks): 39.6
UNIT DIVISION: 0

## 2016-12-02 LAB — BPAM RBC
Blood Product Expiration Date: 201807072359
Blood Product Expiration Date: 201807072359
UNIT TYPE AND RH: 9500
UNIT TYPE AND RH: 9500

## 2016-12-02 LAB — TYPE AND SCREEN
ABO/RH(D): B NEG
Antibody Screen: POSITIVE
DAT, IgG: NEGATIVE
UNIT DIVISION: 0
Unit division: 0

## 2017-01-24 ENCOUNTER — Encounter: Payer: Self-pay | Admitting: Obstetrics

## 2017-01-24 ENCOUNTER — Ambulatory Visit (INDEPENDENT_AMBULATORY_CARE_PROVIDER_SITE_OTHER): Payer: Medicaid Other | Admitting: Obstetrics

## 2017-01-24 ENCOUNTER — Other Ambulatory Visit (HOSPITAL_COMMUNITY)
Admission: RE | Admit: 2017-01-24 | Discharge: 2017-01-24 | Disposition: A | Discharge status: Home / Self Care | Payer: Medicaid Other | Source: Ambulatory Visit | Attending: Obstetrics | Admitting: Obstetrics

## 2017-01-24 VITALS — BP 118/75 | HR 86 | Ht 65.0 in | Wt 229.2 lb

## 2017-01-24 DIAGNOSIS — L0293 Carbuncle, unspecified: Secondary | ICD-10-CM | POA: Diagnosis not present

## 2017-01-24 DIAGNOSIS — N898 Other specified noninflammatory disorders of vagina: Secondary | ICD-10-CM | POA: Diagnosis not present

## 2017-01-24 DIAGNOSIS — Z202 Contact with and (suspected) exposure to infections with a predominantly sexual mode of transmission: Secondary | ICD-10-CM | POA: Diagnosis not present

## 2017-01-24 MED ORDER — AMOXICILLIN-POT CLAVULANATE 875-125 MG PO TABS: 1.0000 | ORAL_TABLET | Freq: Two times a day (BID) | ORAL | 2 refills | Status: DC

## 2017-01-24 NOTE — Progress Notes (Signed)
Post Partum Exam  Diamond Burton is a 31 y.o. [id number] female who presents for a postpartum visit. She is 7 weeks postpartum following a spontaneous vaginal delivery. I have fully reviewed the prenatal and intrapartum course. The delivery was at 39 gestational weeks.  Anesthesia: epidural. Postpartum course has been normal. Baby's course has been normal. Baby is feeding by bottle - Similac Neosure. Bleeding no bleeding. Bowel function is normal. Bladder function is normal. Patient is sexually active. Contraception method is none. Postpartum depression screening:neg  The following portions of the patient's history were reviewed and updated as appropriate: allergies, current medications, past family history, past medical history, past social history, past surgical history and problem list.  Review of Systems A comprehensive review of systems was negative except for: Integument/breast: positive for skin lesion(s)   Recurrent boils.  Objective:  unknown if currently breastfeeding.  General:  alert and no distress   Breasts:  inspection negative, no nipple discharge or bleeding, no masses or nodularity palpable  Lungs: clear to auscultation bilaterally  Heart:  regular rate and rhythm, S1, S2 normal, no murmur, click, rub or gallop  Abdomen: soft, non-tender; bowel sounds normal; no masses,  no organomegaly   Vulva:  positive for boils vulva and mons pubis  Vagina: normal vagina, no discharge, exudate, lesion, or erythema  Cervix:  no cervical motion tenderness  Corpus: normal size, contour, position, consistency, mobility, non-tender  Adnexa:  no mass, fullness, tenderness  Rectal Exam: Not performed.        Assessment and Plan:    1. Vaginal discharge Rx: - Cervicovaginal ancillary only  2. Exposure to STD Rx: - HIV antibody - RPR - Hepatitis B surface antigen - Hepatitis C antibody  3. Recurrent boils Rx: - amoxicillin-clavulanate (AUGMENTIN) 875-125 MG tablet; Take 1 tablet by  mouth 2 (two) times daily.  Dispense: 28 tablet; Refill: 2  Plan:   1. Contraception: none 2. Continue Augmentin prn for recurrent boils 3. Follow up in: 4 months or as needed.

## 2017-01-25 LAB — CERVICOVAGINAL ANCILLARY ONLY
BACTERIAL VAGINITIS: NEGATIVE
Candida vaginitis: NEGATIVE
Chlamydia: NEGATIVE
Neisseria Gonorrhea: NEGATIVE
Trichomonas: NEGATIVE

## 2017-01-25 LAB — HIV ANTIBODY (ROUTINE TESTING W REFLEX): HIV Screen 4th Generation wRfx: NONREACTIVE

## 2017-01-25 LAB — HEPATITIS C ANTIBODY

## 2017-01-25 LAB — RPR: RPR Ser Ql: NONREACTIVE

## 2017-01-25 LAB — HEPATITIS B SURFACE ANTIGEN: HEP B S AG: NEGATIVE

## 2017-02-07 ENCOUNTER — Telehealth: Payer: Self-pay

## 2017-02-07 NOTE — Telephone Encounter (Signed)
Returned call and advised of lab results. 

## 2017-02-13 ENCOUNTER — Encounter (HOSPITAL_COMMUNITY): Payer: Self-pay | Admitting: Emergency Medicine

## 2017-02-13 DIAGNOSIS — Z79899 Other long term (current) drug therapy: Secondary | ICD-10-CM | POA: Insufficient documentation

## 2017-02-13 DIAGNOSIS — R1030 Lower abdominal pain, unspecified: Secondary | ICD-10-CM | POA: Insufficient documentation

## 2017-02-13 DIAGNOSIS — Z87891 Personal history of nicotine dependence: Secondary | ICD-10-CM | POA: Insufficient documentation

## 2017-02-13 NOTE — ED Triage Notes (Signed)
Pt is c/o lower abd pain that started yesterday  Pt states she feels like her stomach is distended  Pt has diarrhea but denies nausea or vomiting    Pt states she had a baby 2 months ago  Pt is not breast feeding

## 2017-02-14 ENCOUNTER — Emergency Department (HOSPITAL_COMMUNITY): Payer: Self-pay

## 2017-02-14 ENCOUNTER — Emergency Department (HOSPITAL_COMMUNITY)
Admission: EM | Admit: 2017-02-14 | Discharge: 2017-02-14 | Disposition: A | Discharge status: Home / Self Care | Payer: Self-pay | Attending: Emergency Medicine | Admitting: Emergency Medicine

## 2017-02-14 ENCOUNTER — Encounter (HOSPITAL_COMMUNITY): Payer: Self-pay | Admitting: Radiology

## 2017-02-14 DIAGNOSIS — R109 Unspecified abdominal pain: Secondary | ICD-10-CM

## 2017-02-14 LAB — URINALYSIS, ROUTINE W REFLEX MICROSCOPIC
BILIRUBIN URINE: NEGATIVE
Glucose, UA: NEGATIVE mg/dL
Hgb urine dipstick: NEGATIVE
KETONES UR: NEGATIVE mg/dL
LEUKOCYTES UA: NEGATIVE
Nitrite: NEGATIVE
Protein, ur: NEGATIVE mg/dL
SPECIFIC GRAVITY, URINE: 1.02 (ref 1.005–1.030)
pH: 5 (ref 5.0–8.0)

## 2017-02-14 LAB — CBC
HEMATOCRIT: 41.1 % (ref 36.0–46.0)
Hemoglobin: 14 g/dL (ref 12.0–15.0)
MCH: 30.5 pg (ref 26.0–34.0)
MCHC: 34.1 g/dL (ref 30.0–36.0)
MCV: 89.5 fL (ref 78.0–100.0)
Platelets: 293 10*3/uL (ref 150–400)
RBC: 4.59 MIL/uL (ref 3.87–5.11)
RDW: 13.5 % (ref 11.5–15.5)
WBC: 8.6 10*3/uL (ref 4.0–10.5)

## 2017-02-14 LAB — COMPREHENSIVE METABOLIC PANEL
ALBUMIN: 3.7 g/dL (ref 3.5–5.0)
ALT: 24 U/L (ref 14–54)
AST: 18 U/L (ref 15–41)
Alkaline Phosphatase: 94 U/L (ref 38–126)
Anion gap: 6 (ref 5–15)
BUN: 13 mg/dL (ref 6–20)
CHLORIDE: 106 mmol/L (ref 101–111)
CO2: 25 mmol/L (ref 22–32)
Calcium: 9.2 mg/dL (ref 8.9–10.3)
Creatinine, Ser: 0.85 mg/dL (ref 0.44–1.00)
GFR calc Af Amer: >60 mL/min (ref >60)
GLUCOSE: 99 mg/dL (ref 65–99)
POTASSIUM: 4.1 mmol/L (ref 3.5–5.1)
SODIUM: 137 mmol/L (ref 135–145)
Total Bilirubin: 0.5 mg/dL (ref 0.3–1.2)
Total Protein: 7.6 g/dL (ref 6.5–8.1)

## 2017-02-14 LAB — WET PREP, GENITAL
Clue Cells Wet Prep HPF POC: NONE SEEN
Sperm: NONE SEEN
Trich, Wet Prep: NONE SEEN
Yeast Wet Prep HPF POC: NONE SEEN

## 2017-02-14 LAB — LIPASE, BLOOD: Lipase: 29 U/L (ref 11–51)

## 2017-02-14 LAB — I-STAT BETA HCG BLOOD, ED (MC, WL, AP ONLY)

## 2017-02-14 MED ORDER — FLUCONAZOLE 150 MG PO TABS: 150.0000 mg | ORAL_TABLET | Freq: Every day | ORAL | 1 refills | Status: DC

## 2017-02-14 MED ORDER — IOPAMIDOL (ISOVUE-300) INJECTION 61%
100.0000 mL | Freq: Once | INTRAVENOUS | Status: AC | PRN
  Administered 2017-02-14: 100 mL via INTRAVENOUS

## 2017-02-14 MED ORDER — SODIUM CHLORIDE 0.9 % IV BOLUS (SEPSIS): 1000.0000 mL | Freq: Once | INTRAVENOUS | Status: DC

## 2017-02-14 MED ORDER — IOPAMIDOL (ISOVUE-300) INJECTION 61%
INTRAVENOUS | Status: DC
Start: 2017-02-14 — End: 2017-02-14
  Filled 2017-02-14: qty 100

## 2017-02-14 NOTE — ED Notes (Signed)
Pelvic supplies at bedside. 

## 2017-02-14 NOTE — ED Provider Notes (Signed)
[facility]-EMERGENCY DEPT Provider Note   CSN: [id number] Arrival date & time: 02/13/17  2340     History   Chief Complaint Chief Complaint  Patient presents with  . Abdominal Pain    HPI Diamond Burton is a 31 y.o. female.  Patient is a 31 year old female with past medical history of UTI, pelvic inflammatory disease, and recent childbirth 2 months ago. She presents for evaluation of lower abdominal pain. This is been present for the past 3 days and is worsening. She reports a vaginal discharge and believe she may have a yeast infection. She is on chronic antibiotics secondary to hidradenitis suppurativa. She denies any urinary complaints. She denies any fevers or chills. She denies any constipation or diarrhea.   The history is provided by the patient.  Abdominal Pain   This is a new problem. Episode onset: 3 days ago. The problem occurs constantly. The problem has been gradually worsening. The pain is associated with an unknown factor. The pain is located in the suprapubic region. The quality of the pain is pressure-like. The pain is moderate. Pertinent negatives include fever, flatus, vomiting, dysuria and hematuria. Exacerbated by: Movement and palpation. Nothing relieves the symptoms.    Past Medical History:  Diagnosis Date  . Abnormal Pap smear   . Depression    while pregnant was on zoloft but now counseling  . Eczema   . Erb's palsy    L arm  . Erb's palsy    left arm  . Herpes    frequent outbreaks on valtrex 500  . History of chlamydia   . History of gonorrhea   . Hydradenitis   . Infection    UTI  . Kidney stones   . Ovarian cyst   . Periodontal disease   . PID (pelvic inflammatory disease)     Patient Active Problem List   Diagnosis Date Noted  . Pregnancy affected by fetal growth restriction 11/28/2016  . Rh negative state in antepartum period 08/02/2016  . Obese 08/02/2016  . Supervision of high risk pregnancy, antepartum 06/01/2016  . History of herpes  genitalis 06/01/2016  . Syphilis affecting pregnancy, antepartum 06/01/2016  . Hydradenitis     Past Surgical History:  Procedure Laterality Date  . NO PAST SURGERIES      OB History    Gravida Para Term Preterm AB Living   3 3 3     3    SAB TAB Ectopic Multiple Live Births         0 3       Home Medications    Prior to Admission medications   Medication Sig Start Date End Date Taking? Authorizing Provider  amoxicillin-clavulanate (AUGMENTIN) 875-125 MG tablet Take 1 tablet by mouth 2 (two) times daily. 01/24/17   Brock Bad, MD  ibuprofen (ADVIL,MOTRIN) 600 MG tablet Take 1 tablet (600 mg total) by mouth every 6 (six) hours. 11/30/16   Denney, Rachelle A, CNM  Prenat-FeCbn-FeAsp-Meth-FA-DHA (PRENATE MINI) 18-0.6-0.4-350 MG CAPS Take 1 capsule by mouth daily. 07/11/16   Brock Bad, MD  valACYclovir (VALTREX) 1000 MG tablet Take 1,000 mg by mouth 2 (two) times daily.    [provider]    Family History Family History  Problem Relation Age of Onset  . Depression Mother   . Diabetes Mother   . Hypertension Mother   . Heart disease Mother   . Asthma Sister   . Asthma Daughter   . Hypertension Maternal Grandmother   . Diabetes Maternal  Grandmother     Social History Social History  Substance Use Topics  . Smoking status: Former Smoker    Packs/day: 0.50    Years: 2.00    Types: Cigarettes  . Smokeless tobacco: Never Used  . Alcohol use No     Comment: occ     Allergies   Patient has no known allergies.   Review of Systems Review of Systems  Constitutional: Negative for fever.  Gastrointestinal: Positive for abdominal pain. Negative for flatus and vomiting.  Genitourinary: Negative for dysuria and hematuria.  All other systems reviewed and are negative.    Physical Exam Updated Vital Signs BP 121/77 (BP Location: Right Arm)   Pulse 61   Temp 98.3 F (36.8 C) (Oral)   Resp 14   Ht 5\' 5"  (1.651 m)   Wt 105.2 kg (232 lb)   SpO2  99%   BMI 38.61 kg/m   Physical Exam  Constitutional: She is oriented to person, place, and time. She appears well-developed and well-nourished. No distress.  HENT:  Head: Normocephalic and atraumatic.  Neck: Normal range of motion. Neck supple.  Cardiovascular: Normal rate and regular rhythm.  Exam reveals no gallop and no friction rub.   No murmur heard. Pulmonary/Chest: Effort normal and breath sounds normal. No respiratory distress. She has no wheezes.  Abdominal: Soft. Bowel sounds are normal. She exhibits no distension. There is tenderness. There is no rebound and no guarding.  There is tenderness to palpation in the suprapubic region.  Musculoskeletal: Normal range of motion.  Neurological: She is alert and oriented to person, place, and time.  Skin: Skin is warm and dry. She is not diaphoretic.  Nursing note and vitals reviewed.    ED Treatments / Results  Labs (all labs ordered are listed, but only abnormal results are displayed) Labs Reviewed  URINALYSIS, ROUTINE W REFLEX MICROSCOPIC - Abnormal; Notable for the following:       Result Value   APPearance TURBID (*)    Bacteria, UA MANY (*)    Squamous Epithelial / LPF 6-30 (*)    All other components within normal limits  WET PREP, GENITAL  LIPASE, BLOOD  COMPREHENSIVE METABOLIC PANEL  CBC  I-STAT BETA HCG BLOOD, ED ([facility], [facility], [facility] ONLY)  GC/CHLAMYDIA PROBE AMP () NOT AT [facility]    EKG  EKG Interpretation None       Radiology No results found.  Procedures Procedures (including critical care time)  Medications Ordered in ED Medications  sodium chloride 0.9 % bolus 1,000 mL (not administered)     Initial Impression / Assessment and Plan / ED Course  I have reviewed the triage vital signs and the nursing notes.  Pertinent labs & imaging results that were available during my care of the patient were reviewed by me and considered in my medical decision making (see chart for details).  Patient  presents with complaints of suprapubic abdominal pain and bloating. She is 2 months status post childbirth. Her workup reveals no significant laboratory abnormalities and CT scan that shows no acute intra-abdominal process. Pelvic examination was within normal limits with only moderate white cells. She reports a history of yeast infections that occur with some frequency and is requesting something in case this is the problem. She will be given Diflucan. GC and Chlamydia cultures are pending. As needed for any problems.  Final Clinical Impressions(s) / ED Diagnoses   Final diagnoses:  None    New Prescriptions New Prescriptions   No  medications on file     Geoffery Lyons, MD 02/14/17 [phone]

## 2017-02-14 NOTE — Discharge Instructions (Signed)
Diflucan as prescribed.  Return to the emergency department if you develop worsening pain, high fever, bloody stool, or other new and concerning symptoms.

## 2017-02-16 LAB — GC/CHLAMYDIA PROBE AMP (~~LOC~~) NOT AT ARMC
Chlamydia: NEGATIVE
Neisseria Gonorrhea: NEGATIVE

## 2017-08-30 ENCOUNTER — Encounter: Payer: Self-pay | Admitting: *Deleted

## 2017-09-02 ENCOUNTER — Encounter (HOSPITAL_COMMUNITY): Payer: Self-pay | Admitting: *Deleted

## 2017-09-02 ENCOUNTER — Emergency Department (HOSPITAL_COMMUNITY)
Admission: EM | Admit: 2017-09-02 | Discharge: 2017-09-03 | Disposition: A | Discharge status: Home / Self Care | Payer: Self-pay | Attending: Emergency Medicine | Admitting: Emergency Medicine

## 2017-09-02 DIAGNOSIS — Z87891 Personal history of nicotine dependence: Secondary | ICD-10-CM | POA: Insufficient documentation

## 2017-09-02 DIAGNOSIS — Z79899 Other long term (current) drug therapy: Secondary | ICD-10-CM | POA: Insufficient documentation

## 2017-09-02 DIAGNOSIS — R1033 Periumbilical pain: Secondary | ICD-10-CM | POA: Insufficient documentation

## 2017-09-02 DIAGNOSIS — R109 Unspecified abdominal pain: Secondary | ICD-10-CM

## 2017-09-02 DIAGNOSIS — L03112 Cellulitis of left axilla: Secondary | ICD-10-CM

## 2017-09-02 DIAGNOSIS — N898 Other specified noninflammatory disorders of vagina: Secondary | ICD-10-CM

## 2017-09-02 LAB — CBC WITH DIFFERENTIAL/PLATELET
Basophils Absolute: 0.1 10*3/uL (ref 0.0–0.1)
Basophils Relative: 1 %
EOS ABS: 0.2 10*3/uL (ref 0.0–0.7)
Eosinophils Relative: 2 %
HCT: 42.4 % (ref 36.0–46.0)
HEMOGLOBIN: 14.6 g/dL (ref 12.0–15.0)
LYMPHS ABS: 4.4 10*3/uL — AB (ref 0.7–4.0)
Lymphocytes Relative: 45 %
MCH: 29.8 pg (ref 26.0–34.0)
MCHC: 34.4 g/dL (ref 30.0–36.0)
MCV: 86.5 fL (ref 78.0–100.0)
MONOS PCT: 7 %
Monocytes Absolute: 0.7 10*3/uL (ref 0.1–1.0)
NEUTROS PCT: 45 %
Neutro Abs: 4.4 10*3/uL (ref 1.7–7.7)
Platelets: 293 10*3/uL (ref 150–400)
RBC: 4.9 MIL/uL (ref 3.87–5.11)
RDW: 13 % (ref 11.5–15.5)
WBC: 9.7 10*3/uL (ref 4.0–10.5)

## 2017-09-02 LAB — COMPREHENSIVE METABOLIC PANEL
ALT: 28 U/L (ref 14–54)
AST: 22 U/L (ref 15–41)
Albumin: 3.9 g/dL (ref 3.5–5.0)
Alkaline Phosphatase: 84 U/L (ref 38–126)
Anion gap: 11 (ref 5–15)
BUN: 11 mg/dL (ref 6–20)
CHLORIDE: 105 mmol/L (ref 101–111)
CO2: 21 mmol/L — AB (ref 22–32)
CREATININE: 0.67 mg/dL (ref 0.44–1.00)
Calcium: 9.1 mg/dL (ref 8.9–10.3)
GFR calc Af Amer: >60 mL/min (ref >60)
GFR calc non Af Amer: >60 mL/min (ref >60)
Glucose, Bld: 89 mg/dL (ref 65–99)
Potassium: 3.4 mmol/L — ABNORMAL LOW (ref 3.5–5.1)
SODIUM: 137 mmol/L (ref 135–145)
Total Bilirubin: 0.7 mg/dL (ref 0.3–1.2)
Total Protein: 8.2 g/dL — ABNORMAL HIGH (ref 6.5–8.1)

## 2017-09-02 LAB — WET PREP, GENITAL
Clue Cells Wet Prep HPF POC: NONE SEEN
Sperm: NONE SEEN
Trich, Wet Prep: NONE SEEN
YEAST WET PREP: NONE SEEN

## 2017-09-02 LAB — URINALYSIS, ROUTINE W REFLEX MICROSCOPIC
BILIRUBIN URINE: NEGATIVE
Glucose, UA: NEGATIVE mg/dL
HGB URINE DIPSTICK: NEGATIVE
KETONES UR: 5 mg/dL — AB
NITRITE: NEGATIVE
PROTEIN: NEGATIVE mg/dL
Specific Gravity, Urine: 1.027 (ref 1.005–1.030)
pH: 5 (ref 5.0–8.0)

## 2017-09-02 LAB — LIPASE, BLOOD: LIPASE: 35 U/L (ref 11–51)

## 2017-09-02 LAB — I-STAT BETA HCG BLOOD, ED (MC, WL, AP ONLY): I-stat hCG, quantitative: <5 m[IU]/mL (ref <5)

## 2017-09-02 MED ORDER — FENTANYL CITRATE (PF) 100 MCG/2ML IJ SOLN
50.0000 ug | Freq: Once | INTRAMUSCULAR | Status: AC
  Administered 2017-09-02: 50 ug via INTRAVENOUS
  Filled 2017-09-02: qty 2

## 2017-09-02 NOTE — ED Triage Notes (Signed)
Pt states she is having a hidradenitis flare up and is having pain in her breasts, vagina, rectum, right torso. Pt also states she is vomiting blood and has yellow vaginal discharge.

## 2017-09-02 NOTE — ED Provider Notes (Signed)
[facility]-EMERGENCY DEPT Provider Note   CSN: [id number] Arrival date & time: 09/02/17  1732     History   Chief Complaint Chief Complaint  Patient presents with  . Hematemesis  . Hydradenitis flare up  . Vaginal Discharge    HPI Diamond Burton is a 32 y.o. 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 Patient reports she has had a right side pain for 1 week.  She states that pain is a "sharp, pinching pain" that is constantly there.  She states nothing makes it better.  She states that the pain is worse with movement.  She has tried Advil with minimal improvement in symptoms.  She reports decreased appetite secondary to symptoms.  She states that pain is worse after drinking alcohol.  Patient reports she had one episode of vomiting.  She states that the vomiting looked like food with small streaks of blood.  No frank or gross blood.  She has not had any diarrhea.  Additionally, patient reports that she has had 3 days of yellow discharge.  She states she is currently sexually active with one partner.  She states she does have a history of STDs and states that she was last treated 6 months ago.  Her last menstrual cycle was on 08/13/17.  Patient also reports that she is having a flareup of her hidradenitis to her BUE.   The history is provided by the patient.    Past Medical History:  Diagnosis Date  . Abnormal Pap smear   . Depression    while pregnant was on zoloft but now counseling  . Eczema   . Erb's palsy    L arm  . Erb's palsy    left arm  . Herpes    frequent outbreaks on valtrex 500  . History of chlamydia   . History of gonorrhea   . Hydradenitis   . Infection    UTI  . Kidney stones   . Ovarian cyst   . Periodontal disease   . PID (pelvic inflammatory disease)     Patient Active Problem List   Diagnosis Date Noted  . Pregnancy affected by fetal growth restriction 11/28/2016  . Rh  negative state in antepartum period 08/02/2016  . Obese 08/02/2016  . Supervision of high risk pregnancy, antepartum 06/01/2016  . History of herpes genitalis 06/01/2016  . Syphilis affecting pregnancy, antepartum 06/01/2016  . Hydradenitis     Past Surgical History:  Procedure Laterality Date  . NO PAST SURGERIES       OB History    Gravida  3   Para  3   Term  3   Preterm      AB      Living  3     SAB      TAB      Ectopic      Multiple  0   Live Births  3            Home Medications    Prior to Admission medications   Medication Sig Start Date End Date Taking? Authorizing Provider  valACYclovir (VALTREX) 1000 MG tablet Take 1,000 mg by mouth daily.    Yes [provider]  amoxicillin-clavulanate (AUGMENTIN) 875-125 MG tablet Take 1 tablet by mouth 2 (two) times daily. Patient not taking: Reported on 09/02/2017 01/24/17   Brock Bad, MD  cephALEXin (KEFLEX) 500 MG capsule Take 1 capsule (500 mg  total) by mouth 4 (four) times daily. 09/03/17   Maxwell Caul, PA-C  fluconazole (DIFLUCAN) 150 MG tablet Take 1 tablet (150 mg total) by mouth daily. Patient not taking: Reported on 09/02/2017 02/14/17   Geoffery Lyons, MD  ibuprofen (ADVIL,MOTRIN) 600 MG tablet Take 1 tablet (600 mg total) by mouth every 6 (six) hours. Patient not taking: Reported on 09/02/2017 11/30/16   Orvilla Cornwall A, CNM  Prenat-FeCbn-FeAsp-Meth-FA-DHA (PRENATE MINI) 18-0.6-0.4-350 MG CAPS Take 1 capsule by mouth daily. Patient not taking: Reported on 09/02/2017 07/11/16   Brock Bad, MD    Family History Family History  Problem Relation Age of Onset  . Depression Mother   . Diabetes Mother   . Hypertension Mother   . Heart disease Mother   . Asthma Sister   . Asthma Daughter   . Hypertension Maternal Grandmother   . Diabetes Maternal Grandmother     Social History Social History   Tobacco Use  . Smoking status: Former Smoker    Packs/day: 0.50     Years: 2.00    Pack years: 1.00    Types: Cigarettes  . Smokeless tobacco: Never Used  Substance Use Topics  . Alcohol use: No    Comment: occ  . Drug use: No     Allergies   Patient has no known allergies.   Review of Systems Review of Systems  Constitutional: Negative for chills and fever.  HENT: Negative for congestion.   Respiratory: Negative for cough and shortness of breath.   Cardiovascular: Negative for chest pain.  Gastrointestinal: Positive for abdominal pain, nausea and vomiting. Negative for diarrhea.  Genitourinary: Negative for dysuria and hematuria.  Musculoskeletal: Negative for back pain and neck pain.  Skin: Positive for color change and wound. Negative for rash.  Neurological: Negative for dizziness, weakness, numbness and headaches.  Psychiatric/Behavioral: Negative for confusion.  All other systems reviewed and are negative.    Physical Exam Updated Vital Signs BP 126/87 (BP Location: Right Arm)   Pulse 69   Temp 97.7 F (36.5 C) (Oral)   Resp 14   Ht 5\' 6"  (1.676 m)   Wt 105.4 kg (232 lb 6.4 oz)   LMP 08/11/2017   SpO2 100%   BMI 37.51 kg/m   Physical Exam  Constitutional: She is oriented to person, place, and time. She appears well-developed and well-nourished.  HENT:  Head: Normocephalic and atraumatic.  Mouth/Throat: Oropharynx is clear and moist and mucous membranes are normal.  Eyes: Pupils are equal, round, and reactive to light. Conjunctivae, EOM and lids are normal.  Neck: Full passive range of motion without pain.  Cardiovascular: Normal rate, regular rhythm, normal heart sounds and normal pulses. Exam reveals no gallop and no friction rub.  No murmur heard. Pulmonary/Chest: Effort normal and breath sounds normal.  The exam was performed with a chaperone present.  Left breast with two small, superficial bumps noted. No overlying warmth, erythema. No masses palpated.  No tenderness palpation to anterior chest wall.  No deformity or  crepitus noted.   Abdominal: Soft. Normal appearance. There is tenderness. There is no rigidity, no guarding and no CVA tenderness.    Abdomen is soft, nondistended.  Diffuse tenderness noted to the right lateral side of the abdomen that extends over to the right upper quadrant.  No McBurney's point tenderness.  No CVA tenderness bilaterally.  Genitourinary: Uterus normal. Cervix exhibits discharge. Cervix exhibits no motion tenderness and no friability. Right adnexum displays no mass and no tenderness.  Left adnexum displays no mass and no tenderness. Vaginal discharge found.  Genitourinary Comments: The exam was performed with a chaperone present. Normal external female genitalia. No lesions, rash, or sores.  White/yellow discharge noted.  No CMT, friability.  No adnexal mass or tenderness bilaterally.   Musculoskeletal: Normal range of motion.  Neurological: She is alert and oriented to person, place, and time.  Skin: Skin is warm and dry. Capillary refill takes less than 2 seconds.  Diffuse warmth, erythema, induration noted to the left axilla.  No crepitus noted.  No fluctuance.  No active purulent drainage.  Psychiatric: She has a normal mood and affect. Her speech is normal.  Nursing note and vitals reviewed.    ED Treatments / Results  Labs (all labs ordered are listed, but only abnormal results are displayed) Labs Reviewed  WET PREP, GENITAL - Abnormal; Notable for the following components:      Result Value   WBC, Wet Prep HPF POC MODERATE (*)    All other components within normal limits  COMPREHENSIVE METABOLIC PANEL - Abnormal; Notable for the following components:   Potassium 3.4 (*)    CO2 21 (*)    Total Protein 8.2 (*)    All other components within normal limits  CBC WITH DIFFERENTIAL/PLATELET - Abnormal; Notable for the following components:   Lymphs Abs 4.4 (*)    All other components within normal limits  URINALYSIS, ROUTINE W REFLEX MICROSCOPIC - Abnormal; Notable  for the following components:   APPearance HAZY (*)    Ketones, ur 5 (*)    Leukocytes, UA SMALL (*)    Bacteria, UA RARE (*)    Squamous Epithelial / LPF 6-30 (*)    All other components within normal limits  LIPASE, BLOOD  I-STAT BETA HCG BLOOD, ED ([facility], [facility], [facility] ONLY)  GC/CHLAMYDIA PROBE AMP ([place]) NOT AT [facility]    EKG None  Radiology Ct Abdomen Pelvis W Contrast  Result Date: 09/03/2017 CLINICAL DATA:  Hidradenitis flare. Hematemesis and vaginal discharge. EXAM: CT ABDOMEN AND PELVIS WITH CONTRAST TECHNIQUE: Multidetector CT imaging of the abdomen and pelvis was performed using the standard protocol following bolus administration of intravenous contrast. CONTRAST:  100mL ISOVUE-300 IOPAMIDOL (ISOVUE-300) INJECTION 61% COMPARISON:  CT abdomen pelvis 02/14/2017 FINDINGS: Lower chest: No basilar pulmonary nodules or pleural effusion. No apical pericardial effusion. Skin and subcutaneous inflammation of the left breast. Hepatobiliary: Normal hepatic contours and density. No intra- or extrahepatic biliary dilatation. Normal gallbladder. Pancreas: Normal parenchymal contours without ductal dilatation. No peripancreatic fluid collection. Spleen: Normal. Adrenals/Urinary Tract: --Adrenal glands: Normal. --Right kidney/ureter: No hydronephrosis, perinephric stranding or nephrolithiasis. No obstructing ureteral stones. --Left kidney/ureter: Nonobstructing lower pole stone measuring 4 mm. --Urinary bladder: Normal appearance for the degree of distention. Stomach/Bowel: --Stomach/Duodenum: No hiatal hernia or other gastric abnormality. Normal duodenal course. --Small bowel: No dilatation or inflammation. --Colon: No focal abnormality. --Appendix: Normal. Vascular/Lymphatic: Normal course and caliber of the major abdominal vessels. No abdominal or pelvic lymphadenopathy. Reproductive: Normal uterus and ovaries. Musculoskeletal. No bony spinal canal stenosis or focal osseous abnormality. Other: There is a  small area of subcutaneous induration and skin thickening at the left aspect of the upper gluteal cleft. IMPRESSION: 1. Skin thickening and subcutaneous inflammation of the lower outer quadrant of the left breast and the left upper aspect of the intergluteal cleft. 2. Nonobstructive left nephrolithiasis. 3. No acute intra-abdominal abnormality. Electronically Signed   By: Deatra RobinsonKevin  Herman M.D.   On: 09/03/2017 01:09  Procedures Procedures (including critical care time)  Medications Ordered in ED Medications  fentaNYL (SUBLIMAZE) injection 50 mcg (50 mcg Intravenous Given 09/02/17 2204)  iopamidol (ISOVUE-300) 61 % injection 100 mL (100 mLs Intravenous Contrast Given 09/03/17 0029)  iopamidol (ISOVUE-300) 61 % injection (  Contrast Given 09/03/17 0100)  sodium chloride 0.9 % injection (  Given by Other 09/03/17 0100)  cephALEXin (KEFLEX) capsule 500 mg (500 mg Oral Given 09/03/17 0128)     Initial Impression / Assessment and Plan / ED Course  I have reviewed the triage vital signs and the nursing notes.  Pertinent labs & imaging results that were available during my care of the patient were reviewed by me and considered in my medical decision making (see chart for details).     33 year old female with multiple complaints including vaginal discharge, right side pain, worsening of her hidradenitis separative a, vomiting.  Patient reports she has had this ongoing right-sided pain for the last week.  States it is like a sharp, pinching pain.  Associated with one episode of vomiting that occurred 1 week ago.  Patient reports that the emesis was blood-tinged but denies any frank or gross hematemesis.  No diarrhea.  Patient also reports worsening of her hidradenitis separative on the left axilla.  Patient denies any fevers.  Additionally, patient reports 3 days of vaginal discharge.  She is currently sexually active with one partner.  They do not use protection.  Patient denies any vaginal bleeding. Patient  is afebril, non-toxic appearing, sitting comfortably on examination table. Vital signs reviewed and stable.  Patient has diffuse tenderness noted to the right sided abdomen on the lateral side.  No McBurney's point tenderness.  No CVA tenderness bilaterally.  Patient with left axilla warmth, erythema, induration.  No active purulent drainage.  No fluctuance noted.  No crepitus noted.  Consider acute infectious etiology.  Plan to check basic labs.  Pelvic exam as documented above.  There was white/yellow discharge noted.  No CMT.  No adnexal mass or tenderness.  Exam not concerning for pelvic inflammatory disease.  I discussed with patient regarding the discharge.  Explained her that the gonorrhea and Chlamydia results do not come back in 2 days.  I discussed treatment options, including getting treatment today here in the ED or wait until the results come back.  Patient states that she would rather wait until the results come back if she does not have a concern for gonorrhea or chlamydia at this time.  CMP shows bicarb of 21, otherwise unremarkable.  UA shows small leukocytes, no other acute infectious etiology.  CBC without any significant leukocytosis, anemia.  Lipase unremarkable.  I discussed results with patient.  She reports improvement in pain after analgesics but still having some tenderness to that right abdomen.  No McBurney's point tenderness.  I discussed further treatment options here in the ED, including chest x-ray for evaluation of rib abnormalities, CT abdomen pelvis given that she is continued to have persistent pain.  Patient does not wish to have a chest x-ray at this time.  She is agreeable to CT abdomen pelvis for further evaluation.  CT abdomen pelvis reviewed.  Negative for any acute intra-abdominal abnormality.  They do see some thickening of the skin next to the left axilla which correlates to patient's cellulitis.  I discussed results with patient.  We will plan to start patient on  antibiotics for treatment of cellulitis of the left axilla likely associated with her hidradenitis supportive of.  No indication for incision and drainage in the ED at this time.  Additionally, instructed patient follow-up with [facility] for further evaluation of the left breast.  Patient also given outpatient referral to general surgery for treatment of hidradenitis separative a.  Patient is tolerating p.o. in the department without any difficulty.  Vital signs are stable.  Patient stable for discharge at this time. Patient had ample opportunity for questions and discussion. All patient's questions were answered with full understanding. Strict return precautions discussed. Patient expresses understanding and agreement to plan.   Final Clinical Impressions(s) / ED Diagnoses   Final diagnoses:  Vaginal discharge  Abdominal pain, unspecified abdominal location  Cellulitis of left axilla    ED Discharge Orders        Ordered    cephALEXin (KEFLEX) 500 MG capsule  4 times daily     09/03/17 0121       Maxwell Caul, PA-C 09/03/17 0136    Lorre Nick, MD 09/03/17 1553

## 2017-09-03 ENCOUNTER — Emergency Department (HOSPITAL_COMMUNITY): Payer: Self-pay

## 2017-09-03 ENCOUNTER — Encounter (HOSPITAL_COMMUNITY): Payer: Self-pay | Admitting: Radiology

## 2017-09-03 MED ORDER — CEPHALEXIN 500 MG PO CAPS
500.0000 mg | ORAL_CAPSULE | Freq: Once | ORAL | Status: AC
  Administered 2017-09-03: 500 mg via ORAL
  Filled 2017-09-03: qty 1

## 2017-09-03 MED ORDER — IOPAMIDOL (ISOVUE-300) INJECTION 61%
100.0000 mL | Freq: Once | INTRAVENOUS | Status: AC | PRN
  Administered 2017-09-03: 100 mL via INTRAVENOUS

## 2017-09-03 MED ORDER — IOPAMIDOL (ISOVUE-300) INJECTION 61%
INTRAVENOUS | Status: AC
  Administered 2017-09-03: 01:00:00
  Filled 2017-09-03: qty 100

## 2017-09-03 MED ORDER — CEPHALEXIN 500 MG PO CAPS: 500.0000 mg | ORAL_CAPSULE | Freq: Four times a day (QID) | ORAL | 0 refills | Status: DC

## 2017-09-03 MED ORDER — SODIUM CHLORIDE 0.9 % IJ SOLN
INTRAMUSCULAR | Status: AC
  Administered 2017-09-03: 01:00:00
  Filled 2017-09-03: qty 50

## 2017-09-03 NOTE — Discharge Instructions (Addendum)
You can take Tylenol or Ibuprofen as directed for pain. You can alternate Tylenol and Ibuprofen every 4 hours. If you take Tylenol at 1pm, then you can take Ibuprofen at 5pm. Then you can take Tylenol again at 9pm.   The test results with take 2-3 days to return. If there is an abnormal result, you will be notified. If you do not hear anything, that means the results were negative. You can also log on MyChart to see the results. If you are positive, Your sexual partner needs to be treated too. Do not have sexual intercourse for the next 7 days and after your partner has been treated.   Take antibiotics as directed. Please take all of your antibiotics until finished.  Apply warm compresses to the arm to help with the symptoms.  As we discussed, you can follow-up with the outpatient general surgeon for further evaluation.  Also follow-up with the breast center regarding the symptoms of your left breast.   Return to the emergency department for fever, worsening abdominal pain, vomiting, fever, chest pain, difficulty breathing or any other worsening or concerning symptoms.

## 2017-09-04 LAB — GC/CHLAMYDIA PROBE AMP (~~LOC~~) NOT AT ARMC
CHLAMYDIA, DNA PROBE: NEGATIVE
NEISSERIA GONORRHEA: NEGATIVE

## 2017-10-13 ENCOUNTER — Other Ambulatory Visit: Payer: Self-pay | Admitting: Family Medicine

## 2017-10-13 DIAGNOSIS — N6310 Unspecified lump in the right breast, unspecified quadrant: Secondary | ICD-10-CM

## 2017-10-13 DIAGNOSIS — N6320 Unspecified lump in the left breast, unspecified quadrant: Principal | ICD-10-CM

## 2017-10-25 ENCOUNTER — Ambulatory Visit
Admission: RE | Admit: 2017-10-25 | Discharge: 2017-10-25 | Disposition: A | Discharge status: Home / Self Care | Payer: Medicaid Other | Source: Ambulatory Visit | Attending: Family Medicine | Admitting: Family Medicine

## 2017-10-25 ENCOUNTER — Ambulatory Visit: Payer: Self-pay

## 2017-10-25 ENCOUNTER — Other Ambulatory Visit: Payer: Self-pay | Admitting: Family Medicine

## 2017-10-25 DIAGNOSIS — N6310 Unspecified lump in the right breast, unspecified quadrant: Secondary | ICD-10-CM

## 2017-10-25 DIAGNOSIS — N6320 Unspecified lump in the left breast, unspecified quadrant: Principal | ICD-10-CM

## 2017-10-30 ENCOUNTER — Telehealth: Payer: Self-pay | Admitting: *Deleted

## 2017-10-30 NOTE — Telephone Encounter (Signed)
Called pt to offer appt either at 330pm or 400pm today with Dr. Anne Hahn. He had cx. She declined stating she was out of the state traveling. She will be back Friday. Advised I will call if any other future cx.  Advised her current appt scheduled for 12/15/17 at 930am.

## 2017-11-22 ENCOUNTER — Encounter: Payer: Self-pay | Admitting: Neurology

## 2017-12-15 ENCOUNTER — Ambulatory Visit: Payer: Medicaid Other | Admitting: Obstetrics

## 2017-12-15 ENCOUNTER — Ambulatory Visit: Payer: Medicaid Other | Admitting: Neurology

## 2017-12-15 ENCOUNTER — Encounter

## 2017-12-19 ENCOUNTER — Encounter: Payer: Self-pay | Admitting: Obstetrics

## 2017-12-19 ENCOUNTER — Ambulatory Visit: Payer: Medicaid Other | Admitting: Obstetrics

## 2017-12-19 ENCOUNTER — Other Ambulatory Visit (HOSPITAL_COMMUNITY)
Admission: RE | Admit: 2017-12-19 | Discharge: 2017-12-19 | Disposition: A | Discharge status: Home / Self Care | Payer: Medicaid Other | Source: Ambulatory Visit | Attending: Obstetrics | Admitting: Obstetrics

## 2017-12-19 VITALS — BP 119/75 | HR 76 | Wt 237.4 lb

## 2017-12-19 DIAGNOSIS — Z01419 Encounter for gynecological examination (general) (routine) without abnormal findings: Secondary | ICD-10-CM | POA: Insufficient documentation

## 2017-12-19 DIAGNOSIS — Z30011 Encounter for initial prescription of contraceptive pills: Secondary | ICD-10-CM | POA: Diagnosis not present

## 2017-12-19 DIAGNOSIS — N926 Irregular menstruation, unspecified: Secondary | ICD-10-CM | POA: Diagnosis not present

## 2017-12-19 DIAGNOSIS — N898 Other specified noninflammatory disorders of vagina: Secondary | ICD-10-CM | POA: Insufficient documentation

## 2017-12-19 DIAGNOSIS — R102 Pelvic and perineal pain unspecified side: Secondary | ICD-10-CM

## 2017-12-19 DIAGNOSIS — Z3009 Encounter for other general counseling and advice on contraception: Secondary | ICD-10-CM

## 2017-12-19 DIAGNOSIS — E669 Obesity, unspecified: Secondary | ICD-10-CM

## 2017-12-19 DIAGNOSIS — Z3202 Encounter for pregnancy test, result negative: Secondary | ICD-10-CM

## 2017-12-19 LAB — POCT URINE PREGNANCY: PREG TEST UR: NEGATIVE

## 2017-12-19 MED ORDER — DROSPIRENONE-ETHINYL ESTRADIOL 3-0.02 MG PO TABS: 1.0000 | ORAL_TABLET | Freq: Every day | ORAL | 11 refills | Status: DC

## 2017-12-19 MED ORDER — IBUPROFEN 800 MG PO TABS: 800.0000 mg | ORAL_TABLET | Freq: Three times a day (TID) | ORAL | 5 refills | Status: DC | PRN

## 2017-12-19 NOTE — Progress Notes (Signed)
Patient ID: Diamond Burton, female   DOB: 01/24/1986, 32 y.o.   MRN: [id number]  Chief Complaint  Patient presents with  . Pelvic Pain    HPI Diamond Burton is a 32 y.o. female.  LLQ pelvic pain for ~ 3 weeks, with abdominal bloating.  Also complains of irregular, painful cycles. HPI  Past Medical History:  Diagnosis Date  . Abnormal Pap smear   . Depression    while pregnant was on zoloft but now counseling  . Eczema   . Erb's palsy    L arm  . Erb's palsy    left arm  . Herpes    frequent outbreaks on valtrex 500  . History of chlamydia   . History of gonorrhea   . Hydradenitis   . Infection    UTI  . Kidney stones   . Ovarian cyst   . Periodontal disease   . PID (pelvic inflammatory disease)     Past Surgical History:  Procedure Laterality Date  . NO PAST SURGERIES      Family History  Problem Relation Age of Onset  . Depression Mother   . Diabetes Mother   . Hypertension Mother   . Heart disease Mother   . Asthma Sister   . Asthma Daughter   . Hypertension Maternal Grandmother   . Diabetes Maternal Grandmother     Social History Social History   Tobacco Use  . Smoking status: Former Smoker    Packs/day: 0.50    Years: 2.00    Pack years: 1.00    Types: Cigarettes  . Smokeless tobacco: Never Used  Substance Use Topics  . Alcohol use: No    Comment: occ  . Drug use: No    No Known Allergies  Current Outpatient Medications  Medication Sig Dispense Refill  . benztropine (COGENTIN) 0.5 MG tablet Take 0.5 mg by mouth 2 (two) times daily.  0  . clindamycin (CLEOCIN T) 1 % external solution APPLY TO UNDERARMS,GROIN AND BUTTOCK TWICE A DAY  6  . [facility] MELATONIN 3 MG TABS TAKE 1 TO 2 TABLET AT BEDTIME, AS NEEDED  0  . minocycline (MINOCIN,DYNACIN) 100 MG capsule Take 100 mg by mouth 2 (two) times daily.  0  . valACYclovir (VALTREX) 1000 MG tablet Take 1,000 mg by mouth daily.     [name] amoxicillin-clavulanate (AUGMENTIN) 875-125 MG tablet Take 1 tablet  by mouth 2 (two) times daily. (Patient not taking: Reported on 09/02/2017) 28 tablet 2  . cephALEXin (KEFLEX) 500 MG capsule Take 1 capsule (500 mg total) by mouth 4 (four) times daily. (Patient not taking: Reported on 12/19/2017) 20 capsule 0  . drospirenone-ethinyl estradiol (YAZ) 3-0.02 MG tablet Take 1 tablet by mouth daily. 1 Package 11  . fluconazole (DIFLUCAN) 150 MG tablet Take 1 tablet (150 mg total) by mouth daily. (Patient not taking: Reported on 09/02/2017) 1 tablet 1  . ibuprofen (ADVIL,MOTRIN) 600 MG tablet Take 1 tablet (600 mg total) by mouth every 6 (six) hours. (Patient not taking: Reported on 09/02/2017) 120 tablet 2  . ibuprofen (ADVIL,MOTRIN) 800 MG tablet Take 1 tablet (800 mg total) by mouth every 8 (eight) hours as needed. 30 tablet 5  . Prenat-FeCbn-FeAsp-Meth-FA-DHA (PRENATE MINI) 18-0.6-0.4-350 MG CAPS Take 1 capsule by mouth daily. (Patient not taking: Reported on 09/02/2017) 30 capsule 11   No current facility-administered medications for this visit.     Review of Systems Review of Systems Constitutional: negative for fatigue and weight loss Respiratory: negative for cough  and wheezing Cardiovascular: negative for chest pain, fatigue and palpitations Gastrointestinal: negative for abdominal pain and change in bowel habits Genitourinary:negative Integument/breast: negative for nipple discharge Musculoskeletal:negative for myalgias Neurological: negative for gait problems and tremors Behavioral/Psych: negative for abusive relationship, depression Endocrine: negative for temperature intolerance      Blood pressure 119/75, pulse 76, weight 237 lb 6.4 oz (107.7 kg), last menstrual period 12/08/2017, not currently breastfeeding.  Physical Exam Physical Exam General:   alert  Skin:   no rash or abnormalities  Lungs:   clear to auscultation bilaterally  Heart:   regular rate and rhythm, S1, S2 normal, no murmur, click, rub or gallop  Breasts:   normal without suspicious  masses, skin or nipple changes or axillary nodes  Abdomen:  normal findings: no organomegaly, soft, non-tender and no hernia  Pelvis:  External genitalia: normal general appearance Urinary system: urethral meatus normal and bladder without fullness, nontender Vaginal: normal without tenderness, induration or masses Cervix: normal appearance Adnexa: normal bimanual exam Uterus: anteverted and non-tender, normal size    50% of 15 min visit spent on counseling and coordination of care.   Data Reviewed Wet prep Cultures  Assessment     1. Encounter for routine gynecological examination with Papanicolaou smear of cervix Rx: - Cytology - PAP  2. Pelvic pain Rx: - ibuprofen (ADVIL,MOTRIN) 800 MG tablet; Take 1 tablet (800 mg total) by mouth every 8 (eight) hours as needed.  Dispense: 30 tablet; Refill: 5 - US PELVIC COMPLETE WITH TRANSVAGINAL; Future  3. Vaginal discharge Rx: - Cervicovaginal ancillary only  4. Irregular periods/menstrual cycles - needs cycle regulation  5. Encounter for other general counseling and advice on contraception - OCP's discussed, recommended and agreed to, mainly for cycle regulation  6. Encounter for initial prescription of contraceptive pills Rx: - POCT urine pregnancy - drospirenone-ethinyl estradiol (YAZ) 3-0.02 MG tablet; Take 1 tablet by mouth daily.  Dispense: 1 Package; Refill: 11  7. Obesity (BMI 35.0-39.9 without comorbidity) - program of caloric reduction, exercise and behavioral modification recommended for weight loss and cycle regulation    Plan    Follow up in 2 weeks   Orders Placed This Encounter  Procedures  . US PELVIC COMPLETE WITH TRANSVAGINAL    Standing Status:   Future    Standing Expiration Date:   02/20/2019    Order Specific Question:   Reason for Exam (SYMPTOM  OR DIAGNOSIS REQUIRED)    Answer:   Pelvic pain    Order Specific Question:   Preferred imaging [facility]?    [facility]  . POCT urine  pregnancy   Meds ordered this encounter  Medications  . ibuprofen (ADVIL,MOTRIN) 800 MG tablet    Sig: Take 1 tablet (800 mg total) by mouth every 8 (eight) hours as needed.    Dispense:  30 tablet    Refill:  5  . drospirenone-ethinyl estradiol (YAZ) 3-0.02 MG tablet    Sig: Take 1 tablet by mouth daily.    Dispense:  1 Package    Refill:  11     Brock BadHARLES A. HARPER MD 12-19-2017

## 2017-12-19 NOTE — Progress Notes (Signed)
Pt c/o pelvic pain x 3 weeks with abdominal bloating. Pt also c/o two periods last 2 months and dysmenorrhea.

## 2017-12-20 ENCOUNTER — Other Ambulatory Visit: Payer: Self-pay | Admitting: Obstetrics

## 2017-12-20 DIAGNOSIS — B373 Candidiasis of vulva and vagina: Secondary | ICD-10-CM

## 2017-12-20 DIAGNOSIS — B3731 Acute candidiasis of vulva and vagina: Secondary | ICD-10-CM

## 2017-12-20 LAB — CERVICOVAGINAL ANCILLARY ONLY
BACTERIAL VAGINITIS: NEGATIVE
Candida vaginitis: POSITIVE — AB
Chlamydia: NEGATIVE
Neisseria Gonorrhea: NEGATIVE
TRICH (WINDOWPATH): NEGATIVE

## 2017-12-20 MED ORDER — FLUCONAZOLE 150 MG PO TABS: 150.0000 mg | ORAL_TABLET | Freq: Once | ORAL | 0 refills | Status: AC

## 2017-12-21 LAB — CYTOLOGY - PAP
Diagnosis: NEGATIVE
HPV: NOT DETECTED

## 2017-12-26 ENCOUNTER — Ambulatory Visit (HOSPITAL_COMMUNITY)
Admission: RE | Admit: 2017-12-26 | Discharge: 2017-12-26 | Disposition: A | Discharge status: Home / Self Care | Payer: Medicaid Other | Source: Ambulatory Visit | Attending: Obstetrics | Admitting: Obstetrics

## 2017-12-26 DIAGNOSIS — R102 Pelvic and perineal pain: Secondary | ICD-10-CM | POA: Insufficient documentation

## 2018-01-09 ENCOUNTER — Ambulatory Visit: Payer: Medicaid Other | Admitting: Obstetrics

## 2018-02-05 ENCOUNTER — Ambulatory Visit: Payer: Medicaid Other | Admitting: Neurology

## 2018-02-05 ENCOUNTER — Encounter: Payer: Self-pay | Admitting: Neurology

## 2018-02-05 ENCOUNTER — Other Ambulatory Visit: Payer: Self-pay

## 2018-02-05 ENCOUNTER — Encounter

## 2018-02-05 VITALS — BP 118/70 | HR 72 | Ht 62.0 in | Wt 237.0 lb

## 2018-02-05 DIAGNOSIS — Z8669 Personal history of other diseases of the nervous system and sense organs: Secondary | ICD-10-CM | POA: Diagnosis not present

## 2018-02-05 DIAGNOSIS — M7502 Adhesive capsulitis of left shoulder: Secondary | ICD-10-CM

## 2018-02-05 DIAGNOSIS — Z87898 Personal history of other specified conditions: Secondary | ICD-10-CM

## 2018-02-05 HISTORY — DX: Personal history of other specified conditions: Z87.898

## 2018-02-05 NOTE — Progress Notes (Signed)
Reason for visit: Left arm weakness  Referring physician: Dr. Wilmon ArmsSun  Diamond Burton is a 32 y.o. 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  The patient gives a lifelong history of some difficulty using the left arm, she claims that she wore a sling for the arm throughout her life.  She did have some physical therapy when she was 32 years old, she claims that she has never been able to actively or passively elevate the left arm even to 90 degrees.  The patient reports a chronic history of pain in the left shoulder going down the left arm to the hand with some numbness and tingling sensations down to the hand.  The patient reports weakness of grip with the left hand, she has difficulty using it, she may develop a tremor in the left arm.  The patient reports no problems using the right arm or the lower extremities, she has no balance problems, no falls, no difficulty controlling the bowels or the bladder.  She does not have a significant amount of neck pain per se.  She claims at birth there was some history of loss of oxygen to the brain as well.  The patient comes to this office for further evaluation.  Past Medical History:  Diagnosis Date  . Abnormal Pap smear   . Depression    while pregnant was on zoloft but now counseling  . Eczema   . Erb's palsy    L arm  . Erb's palsy    left arm  . Herpes    frequent outbreaks on valtrex 500  . History of chlamydia   . 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 History of gonorrhea   . Hydradenitis   . Infection    UTI  . Kidney stones   . Ovarian cyst   . Periodontal disease   . PID (pelvic inflammatory disease)     Past Surgical History:  Procedure Laterality Date  . NO PAST SURGERIES      Family History  Problem Relation Age of Onset  . Depression Mother   . Diabetes Mother   . Hypertension Mother   .  Heart disease Mother   . Asthma Sister   . Asthma Daughter   . Hypertension Maternal Grandmother   . Diabetes Maternal Grandmother     Social history:  reports that she has quit smoking. Her smoking use included cigarettes. She has a 1.00 pack-year smoking history. She has never used smokeless tobacco. She reports that she does not drink alcohol or use drugs.  Medications:  Prior to Admission medications   Medication Sig Start Date End Date Taking? Authorizing Provider  Benzoyl Peroxide (BENZOYL PEROXIDE) 10 % LIQD Apply topically.   Yes [provider]  benztropine (COGENTIN) 0.5 MG tablet Take 0.5 mg by mouth 2 (two) times daily. 10/25/17  Yes [provider]  clindamycin (CLEOCIN T) 1 % external solution APPLY TO UNDERARMS,GROIN AND BUTTOCK TWICE A DAY 12/05/17  Yes [provider]  [facility] MELATONIN 3 MG TABS TAKE 1 TO 2 TABLET AT BEDTIME, AS NEEDED 10/25/17  Yes [provider]  doxycycline (VIBRA-TABS) 100 MG tablet Take 100 mg by mouth 2 (two) times daily. 01/26/18  Yes [provider]  fluconazole (DIFLUCAN) 150 MG tablet Take 150 mg by mouth daily.   Yes [provider]  ibuprofen (ADVIL,MOTRIN) 800 MG tablet Take 1 tablet (800 mg total) by mouth every 8 (eight) hours as needed. 12/19/17  Yes Brock Bad, MD  PARoxetine (PAXIL) 10 MG tablet Take 10 mg by mouth daily.   Yes [provider]  risperiDONE (RISPERDAL) 1 MG tablet Take 1 mg by mouth at bedtime.   Yes [provider]  traZODone (DESYREL) 50 MG tablet Take 50 mg by mouth at bedtime.   Yes [provider]  valACYclovir (VALTREX) 1000 MG tablet Take 1,000 mg by mouth daily.    Yes [provider]     No Known Allergies  ROS:  Out of a complete 14 system review of symptoms, the patient complains only of the following symptoms, and all other reviewed systems are negative.  Fevers, chills, fatigue Moles Feeling hot, cold, increased  thirst Cramps, aching muscles Headache, weakness, dizziness Depression, anxiety, change in appetite Insomnia  Blood pressure 118/70, pulse 72, height 5\' 2"  (1.575 m), weight 237 lb (107.5 kg), SpO2 98 %, not currently breastfeeding.  Physical Exam  General: The patient is alert and cooperative at the time of the examination.  The patient is markedly obese.  Eyes: Pupils are equal, round, and reactive to light. Discs are flat bilaterally.  Neck: The neck is supple, no carotid bruits are noted.  Respiratory: The respiratory examination is clear.  Cardiovascular: The cardiovascular examination reveals a regular rate and rhythm, no obvious murmurs or rubs are noted.  Skin: Extremities are without significant edema.  No significant atrophy is seen involving the left upper extremity.  The left shoulder can be passively elevate to about 90 degrees, there is some restriction with internal and external rotation.  Neurologic Exam  Mental status: The patient is alert and oriented x 3 at the time of the examination. The patient has apparent normal recent and remote memory, with an apparently normal attention span and concentration ability.  Cranial nerves: Facial symmetry is present. There is good sensation of the face to pinprick and soft touch bilaterally. The strength of the facial muscles and the muscles to head turning and shoulder shrug are normal bilaterally. Speech is well enunciated, no aphasia or dysarthria is noted. Extraocular movements are full. Visual fields are full. The tongue is midline, and the patient has symmetric elevation of the soft palate. No obvious hearing deficits are noted.  Motor: The motor testing reveals 5 over 5 strength of all 4 extremities, with exception of some slight weakness of the intrinsic muscles of the left hand, 4/5 strength with deltoid muscle on the left and with external rotation on the left, good biceps strength on the left. Good symmetric motor tone is  noted throughout.  Sensory: Sensory testing is intact to pinprick, soft touch, vibration sensation, and position sense on all 4 extremities. No evidence of extinction is noted.  Coordination: Cerebellar testing reveals good finger-nose-finger and heel-to-shin bilaterally.  Gait and station: Gait is normal. Tandem gait is normal. Romberg is negative. No drift is seen.  Reflexes: Deep tendon reflexes are symmetric and normal bilaterally. Toes are downgoing bilaterally.   Assessment/Plan:  1.  History of left Erb's palsy  2.  Left frozen shoulder  The patient will be set up for physical therapy for range of movement of the left shoulder.  The patient will undergo EMG and nerve conduction study evaluation, nerve conductions will be done on both arms, EMG on the left arm.  If there is no evidence of chronic denervation in the  left arm, MRI of the brain will be done.  The patient does not appear to have significant atrophy as one would expect with an Erb's palsy.  Diamond Palau MD 02/05/2018 9:06 AM  [facility] [address] [place], [place] [address]  Phone [phone] Fax [phone]

## 2018-02-05 NOTE — Patient Instructions (Signed)
We will get physical therapy for the left shoulder

## 2018-02-07 ENCOUNTER — Ambulatory Visit: Payer: Medicaid Other | Admitting: Neurology

## 2018-02-07 ENCOUNTER — Ambulatory Visit (INDEPENDENT_AMBULATORY_CARE_PROVIDER_SITE_OTHER): Payer: Medicaid Other | Admitting: Neurology

## 2018-02-07 ENCOUNTER — Encounter: Payer: Self-pay | Admitting: Neurology

## 2018-02-07 DIAGNOSIS — Z87898 Personal history of other specified conditions: Secondary | ICD-10-CM

## 2018-02-07 DIAGNOSIS — M25512 Pain in left shoulder: Secondary | ICD-10-CM | POA: Diagnosis not present

## 2018-02-07 DIAGNOSIS — Z8669 Personal history of other diseases of the nervous system and sense organs: Principal | ICD-10-CM

## 2018-02-07 DIAGNOSIS — R202 Paresthesia of skin: Secondary | ICD-10-CM

## 2018-02-07 NOTE — Procedures (Signed)
     HISTORY:  Diamond Burton is a 32 year old patient with a history of Erb's palsy at birth.  The patient has never regained full function of the left arm.  She is being evaluated for this issue.  NERVE CONDUCTION STUDIES:  Nerve conduction studies were performed on both upper extremities.  The distal motor latencies and motor amplitudes for the median nerves bilaterally and for the left ulnar nerve were normal.  The nerve conduction velocities for these nerves were normal.  The sensory latencies for the radial nerves were normal bilaterally, the left median sensory latency was slightly prolonged, normal on the left and normal for the ulnar nerves bilaterally.  The F-wave latency for the left ulnar nerve was normal.  EMG STUDIES:  EMG study was performed on the left upper extremity:  The first dorsal interosseous muscle reveals 2 to 4 K units with full recruitment. No fibrillations or positive waves were noted. The abductor pollicis brevis muscle reveals 2 to 4 K units with full recruitment. No fibrillations or positive waves were noted. The extensor indicis proprius muscle reveals 1 to 3 K units with full recruitment. No fibrillations or positive waves were noted. The biceps muscle reveals 1 to 4 K units with decreased recruitment. No fibrillations or positive waves were noted. The triceps muscle reveals 2 to 6 K units with decreased recruitment. No fibrillations or positive waves were noted. The anterior deltoid muscle reveals 2 to 5 K units with decreased recruitment. No fibrillations or positive waves were noted. The cervical paraspinal muscles were tested at 2 levels. No abnormalities of insertional activity were seen at either level tested. There was poor relaxation.   IMPRESSION:  Nerve conduction studies done on both upper extremities shows no significant abnormalities with exception of a minimal prolongation of the left median sensory latency.  EMG evaluation of the left upper  extremity does confirm chronic stable denervation in the C5, C6, and C7 innervated muscles of the left arm consistent with a prior Erb's palsy.  The patient has relatively good ability to activate these muscles, however.  Diamond Burton. Keith Sebastian Dzik MD 02/07/2018 2:50 PM  [facility] [address] [place], [place] [address]  Phone [phone] Fax [phone]

## 2018-02-07 NOTE — Progress Notes (Addendum)
Nerve conduction studies done today or relatively unremarkable in the arms, EMG of the left arm does confirm the presence of a prior Erb's palsy.  I believe that most of her dysfunction is related to a frozen shoulder rather than muscular weakness, the patient will be entering into physical therapy to help improve range of movement of the shoulder.  An orthopedic surgery referral in the future may be indicated.    MNC    Nerve / Sites Muscle Latency Ref. Amplitude Ref. Rel Amp Segments Distance Velocity Ref. Area    ms ms mV mV %  cm m/s m/s mVms  L Median - APB     Wrist APB 3.2 ?4.4 7.6 ?4.0 100 Wrist - APB 7   20.0     Upper arm APB 6.8  8.2  109 Upper arm - Wrist 18 50 ?49 22.3  R Median - APB     Wrist APB 3.1 ?4.4 6.8 ?4.0 100 Wrist - APB 7   22.4     Upper arm APB 6.9  7.5  110 Upper arm - Wrist 19 50 ?49 24.1  L Ulnar - ADM     Wrist ADM 2.3 ?3.3 6.4 ?6.0 100 Wrist - ADM 7   22.7     B.Elbow ADM 5.4  6.3  98.3 B.Elbow - Wrist 18 60 ?49 23.0     A.Elbow ADM 7.4  6.0  95.3 A.Elbow - B.Elbow 10 49 ?49 22.7         A.Elbow - Wrist               SNC    Nerve / Sites Rec. Site Peak Lat Ref.  Amp Ref. Segments Distance    ms ms V V  cm  L Radial - Anatomical snuff box (Forearm)     Forearm Wrist 2.8 ?2.9 3 ?15 Forearm - Wrist 10  R Radial - Anatomical snuff box (Forearm)     Forearm Wrist 2.2 ?2.9 26 ?15 Forearm - Wrist 10  L Median - Orthodromic (Dig II, Mid palm)     Dig II Wrist 3.7 ?3.4 2 ?10 Dig II - Wrist 13  R Median - Orthodromic (Dig II, Mid palm)     Dig II Wrist 2.9 ?3.4 20 ?10 Dig II - Wrist 13  L Ulnar - Orthodromic, (Dig V, Mid palm)     Dig V Wrist 2.5 ?3.1 7 ?5 Dig V - Wrist 11  R Ulnar - Orthodromic, (Dig V, Mid palm)     Dig V Wrist 2.3 ?3.1 9 ?5 Dig V - Wrist 5611                 F  Wave    Nerve F Lat Ref.   ms ms  L Ulnar - ADM 24.8 ?32.0

## 2018-02-07 NOTE — Progress Notes (Signed)
Please refer to EMG and nerve conduction study procedure note. 

## 2018-02-20 ENCOUNTER — Ambulatory Visit: Payer: Medicaid Other | Admitting: Obstetrics

## 2018-02-22 ENCOUNTER — Encounter: Payer: Self-pay | Admitting: Physical Therapy

## 2018-02-22 ENCOUNTER — Ambulatory Visit: Payer: Medicaid Other | Attending: Neurology | Admitting: Physical Therapy

## 2018-02-22 DIAGNOSIS — M7502 Adhesive capsulitis of left shoulder: Secondary | ICD-10-CM | POA: Diagnosis present

## 2018-02-22 DIAGNOSIS — Z8669 Personal history of other diseases of the nervous system and sense organs: Secondary | ICD-10-CM | POA: Diagnosis present

## 2018-02-22 DIAGNOSIS — Z87898 Personal history of other specified conditions: Secondary | ICD-10-CM

## 2018-02-22 NOTE — Therapy (Signed)
[facility] [address] [place], [place], [address] Phone: [phone]   Fax:  [phone]  Physical Therapy Evaluation  Patient Details  Name: Diamond Burton MRN: [id number] Date of Birth: 1986-04-02 Referring Provider: Anne Hahn MD   Encounter Date: 02/22/2018  PT End of Session - 02/22/18 2234    Visit Number  1    Number of Visits  12    Date for PT Re-Evaluation  04/05/18    Authorization Type  MCD    PT Start Time  1545    PT Stop Time  1630    PT Time Calculation (min)  45 min    Activity Tolerance  Patient tolerated treatment well;Patient limited by pain    Behavior During Therapy  [facility] for tasks assessed/performed       Past Medical History:  Diagnosis Date  . Abnormal Pap smear   . Depression    while pregnant was on zoloft but now counseling  . Eczema   . Erb's palsy    L arm  . Erb's palsy    left arm  . Herpes    frequent outbreaks on valtrex 500  . History of chlamydia   . 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 History of gonorrhea   . Hydradenitis   . Infection    UTI  . Kidney stones   . Ovarian cyst   . Periodontal disease   . PID (pelvic inflammatory disease)     Past Surgical History:  Procedure Laterality Date  . NO PAST SURGERIES      There were no vitals filed for this visit.   Subjective Assessment - 02/22/18 1655    Subjective  Pt relays Lt arm Erb palsey from birth. She has always had difficulty and pain using her arm and now MD thinks she has frozen shoulder so she was referred to PT.    Limitations  Lifting   reaching   Diagnostic tests  EMG of the left arm does confirm the presence of a prior Erb's palsy.    Patient Stated Goals  use my arm better    Currently in Pain?  Yes    Pain Score  8     Pain Location  Shoulder    Pain Orientation  Left    Pain Type  Chronic pain    Pain Radiating Towards  Lt arm with N/T in her hand    Pain Onset  More than a month ago    Pain Frequency  Intermittent    Aggravating Factors   reaching, lifting, carrying    Pain Relieving Factors  heat, sometimes meds but not really    Multiple Pain Sites  No         [facility] PT Assessment - 02/22/18 0001      Assessment   Medical Diagnosis  Adhesive capsulitis of left shoulder, Erb's palsey of Lt arm    Referring Provider  Anne Hahn MD    Hand Dominance  Right    Next MD Visit  PRN    Prior Therapy  none      Precautions   Precautions  None      Balance Screen   Has the patient fallen in the past 6 months  No      Home Environment   Living Environment  Private residence      Prior Function   Level of Independence  Independent with basic ADLs   needs help for reaching  or lifting shoulder ht or above   Vocation  On disability    Leisure  read, travel      Cognition   Overall Cognitive Status  Within Functional Limits for tasks assessed      Observation/Other Assessments   Focus on Therapeutic Outcomes (FOTO)   MCD      Sensation   Light Touch  Appears Intact      Posture/Postural Control   Posture Comments  rounded shouler and appears to have ant translation of humerus.      ROM / Strength   AROM / PROM / Strength  AROM;PROM;Strength      AROM   AROM Assessment Site  Shoulder    Right/Left Shoulder  Left    Left Shoulder Flexion  40 Degrees    Left Shoulder ABduction  35 Degrees    Left Shoulder Internal Rotation  --   greater trochanter   Left Shoulder External Rotation  --   Ear or neutral     PROM   PROM Assessment Site  Shoulder    Right/Left Shoulder  Left    Left Shoulder Flexion  70 Degrees    Left Shoulder ABduction  65 Degrees    Left Shoulder Internal Rotation  --   [facility]   Left Shoulder External Rotation  10 Degrees      Strength   Overall Strength Comments  elbow 4-/5 MMT    Strength Assessment Site  Shoulder    Right/Left Shoulder  Left    Left Shoulder Flexion  2+/5    Left Shoulder ABduction  2+/5    Left Shoulder Internal  Rotation  4/5    Left Shoulder External Rotation  4/5      Flexibility   Soft Tissue Assessment /Muscle Length  --   tight biceps, posterior shoulder, UT     Palpation   Palpation comment  TTP entire Lt UE                Objective measurements completed on examination: See above findings.      [facility] Adult PT Treatment/Exercise - 02/22/18 0001      Exercises   Exercises  Shoulder      Shoulder Exercises: Supine   External Rotation  AAROM;10 reps   wand   Flexion  AAROM;10 reps   wand   ABduction  AAROM;10 reps   wand   Other Supine Exercises  chest press 5 reps with wand      Shoulder Exercises: Stretch   Other Shoulder Stretches  UT stretch 30 sec X 1      Modalities   Modalities  Moist Heat      Moist Heat Therapy   Number Minutes Moist Heat  10 Minutes    Moist Heat Location  Shoulder      Manual Therapy   Manual therapy comments  PROM all planes Lt shoulder             PT Education - 02/22/18 2233    Education Details  HEP, POC    Person(s) Educated  Patient    Methods  Explanation;Demonstration;Handout    Comprehension  Need further instruction;Verbalized understanding;Returned demonstration       PT Short Term Goals - 02/22/18 2242      PT SHORT TERM GOAL #1   Title  Pt will be I and compliant with HEP. 3 weeks 03/15/18    Baseline  no HEP until today    Status  New  PT SHORT TERM GOAL #2   Title  Pt will increase Lt shoulder PROM by 5 deg each plane to improve UE mobility and reaching. 3 weeks 03/15/18    Status  New        PT Long Term Goals - 02/22/18 2243      PT LONG TERM GOAL #1   Title  Pt will be I and compliant with final advanced HEP. 6 weeks 04/05/18    Baseline  no HEP until today    Status  New      PT LONG TERM GOAL #2   Title  Pt will improve Lt shoulder flexion and abd ROM >90 deg for reaching. 6 weeks 04/05/18    Baseline  40 deg flexion and 35 deg abd    Status  New      PT LONG TERM GOAL #3    Title  Pt will report overall pain less than 3-4/10 with usual activity. 6 weeks 04/05/18    Baseline  8-9/10    Status  New             Plan - 02/22/18 2236    Clinical Impression Statement  Pt presents with arm weakness, stiffness, adhesive capsulitis, and Erb's palsey of her Lt arm limiting its funcitonal use. End feel is difficult to fully assess for capsular end feel due to pain and guarding. She will benefit from skilled PT to address her deficts and improve functional use of her Lt arm.    Clinical Presentation  Evolving    Clinical Presentation due to:  severe weakness and ROM deficits and pain that are wosening.    Clinical Decision Making  Moderate    Rehab Potential  Good    Clinical Impairments Affecting Rehab Potential  chronic nature and damage of her condition    PT Frequency  2x / week    PT Duration  6 weeks    PT Treatment/Interventions  Cryotherapy;Electrical Stimulation;Iontophoresis 4mg /ml Dexamethasone;Moist Heat;Ultrasound;Therapeutic exercise;Neuromuscular re-education;Manual techniques;Passive range of motion;Dry needling;Taping;Vasopneumatic Device;Joint Manipulations    PT Next Visit Plan  HEP review, stretching, ROM, MT for jt mobs and PROM, modaltites PRN       Patient will benefit from skilled therapeutic intervention in order to improve the following deficits and impairments:  Decreased activity tolerance, Decreased endurance, Decreased range of motion, Decreased strength, Hypomobility, Impaired flexibility, Increased fascial restricitons, Impaired tone, Postural dysfunction, Pain  Visit Diagnosis: History of Erb's palsy  Adhesive capsulitis of left shoulder     Problem List Patient Active Problem List   Diagnosis Date Noted  . 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 Pregnancy affected by fetal growth restriction 11/28/2016  . Rh negative state in antepartum period 08/02/2016  . Obese 08/02/2016  . Supervision of high risk pregnancy, antepartum  06/01/2016  . History of herpes genitalis 06/01/2016  . Syphilis affecting pregnancy, antepartum 06/01/2016  . Hydradenitis     April MansonBrian R Zona Pedro, PT, DPT 02/22/2018, 10:47 PM  [facility] [address] [place], [place], [address] Phone: [phone]   Fax:  [phone]  Name: Houston Sireniarra Wooldridge MRN: [id number] Date of Birth: Nov 17, 1985

## 2018-03-01 ENCOUNTER — Ambulatory Visit: Payer: Medicaid Other | Admitting: Obstetrics

## 2018-03-01 ENCOUNTER — Ambulatory Visit: Payer: Medicaid Other | Admitting: Physical Therapy

## 2018-03-05 ENCOUNTER — Ambulatory Visit: Payer: Medicaid Other | Admitting: Physical Therapy

## 2018-03-05 DIAGNOSIS — Z87898 Personal history of other specified conditions: Secondary | ICD-10-CM

## 2018-03-05 DIAGNOSIS — Z8669 Personal history of other diseases of the nervous system and sense organs: Secondary | ICD-10-CM

## 2018-03-05 DIAGNOSIS — M7502 Adhesive capsulitis of left shoulder: Secondary | ICD-10-CM

## 2018-03-05 NOTE — Therapy (Signed)
[facility] [address] [place], [place], [address] Phone: [phone]   Fax:  [phone]  Physical Therapy Treatment  Patient Details  Name: Diamond Burton MRN: [id number] Date of Birth: 03-04-1986 Referring Provider: Anne HahnWillis MD   Encounter Date: 03/05/2018  PT End of Session - 03/05/18 1537    Visit Number  2    Number of Visits  12    Date for PT Re-Evaluation  04/05/18    Authorization Type  MCD    Authorization Time Period  3 approved thru 03/29/18    Authorization - Visit Number  2    Authorization - Number of Visits  3    PT Start Time  0300    PT Stop Time  0345    PT Time Calculation (min)  45 min    Activity Tolerance  Patient limited by pain    Behavior During Therapy  [facility] for tasks assessed/performed       Past Medical History:  Diagnosis Date  . Abnormal Pap smear   . Depression    while pregnant was on zoloft but now counseling  . Eczema   . Erb's palsy    L arm  . Erb's palsy    left arm  . Herpes    frequent outbreaks on valtrex 500  . History of chlamydia   . 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 History of gonorrhea   . Hydradenitis   . Infection    UTI  . Kidney stones   . Ovarian cyst   . Periodontal disease   . PID (pelvic inflammatory disease)     Past Surgical History:  Procedure Laterality Date  . NO PAST SURGERIES      There were no vitals filed for this visit.  Subjective Assessment - 03/05/18 1530    Subjective  Pt relays no changes in symtoms or pain yet.    Currently in Pain?  Yes    Pain Score  8     Pain Location  Shoulder    Pain Orientation  Left    Pain Type  Chronic pain                       [facility] Adult PT Treatment/Exercise - 03/05/18 0001      Exercises   Exercises  Shoulder      Shoulder Exercises: Seated   Row  20 reps      Shoulder Exercises: Standing   Other Standing Exercises  pendulums X 20 CW/CCW    Other Standing  Exercises  standing P-ball rolls for AAROM flexion and scaption 5 sec hold X 10 ea      Shoulder Exercises: Pulleys   Flexion  2 minutes    Scaption  2 minutes    Scaption Limitations  limited ROM makes position difficult      Shoulder Exercises: Stretch   Other Shoulder Stretches  UT stretch 30 sec X 2      Modalities   Modalities  Moist Heat      Moist Heat Therapy   Number Minutes Moist Heat  7 Minutes    Moist Heat Location  Shoulder      Manual Therapy   Manual therapy comments  PROM all planes Lt shoulder, gentle distraction mobs, pt has poor tolerance to MT today             PT Education - 03/05/18 1537    Education  Details  IONTO and new exercises    Person(s) Educated  Patient    Methods  Explanation;Demonstration    Comprehension  Verbalized understanding;Returned demonstration;Need further instruction       PT Short Term Goals - 02/22/18 2242      PT SHORT TERM GOAL #1   Title  Pt will be I and compliant with HEP. 3 weeks 03/15/18    Baseline  no HEP until today    Status  New      PT SHORT TERM GOAL #2   Title  Pt will increase Lt shoulder PROM by 5 deg each plane to improve UE mobility and reaching. 3 weeks 03/15/18    Status  New        PT Long Term Goals - 02/22/18 2243      PT LONG TERM GOAL #1   Title  Pt will be I and compliant with final advanced HEP. 6 weeks 04/05/18    Baseline  no HEP until today    Status  New      PT LONG TERM GOAL #2   Title  Pt will improve Lt shoulder flexion and abd ROM >90 deg for reaching. 6 weeks 04/05/18    Baseline  40 deg flexion and 35 deg abd    Status  New      PT LONG TERM GOAL #3   Title  Pt will report overall pain less than 3-4/10 with usual activity. 6 weeks 04/05/18    Baseline  8-9/10    Status  New            Plan - 03/05/18 1539    Clinical Impression Statement  Session today limited by pain and ROM. She was shown new AAROM exercises and needed cuing to stay in pain free ROM. PROM  attemped but pt had poor tolerance due to pain and guarding. Sesison ended with MHP and trial of IONTO to dec pain and inflammmation    Rehab Potential  Fair    Clinical Impairments Affecting Rehab Potential  chronic nature and damage of her condition    PT Frequency  2x / week    PT Duration  6 weeks    PT Treatment/Interventions  Cryotherapy;Electrical Stimulation;Iontophoresis 4mg /ml Dexamethasone;Moist Heat;Ultrasound;Therapeutic exercise;Neuromuscular re-education;Manual techniques;Passive range of motion;Dry needling;Taping;Vasopneumatic Device;Joint Manipulations    PT Next Visit Plan  assess response to IONTO, AAROM, PROM, modalaties    PT Home Exercise Plan  AAROM       Patient will benefit from skilled therapeutic intervention in order to improve the following deficits and impairments:  Decreased activity tolerance, Decreased endurance, Decreased range of motion, Decreased strength, Hypomobility, Impaired flexibility, Increased fascial restricitons, Impaired tone, Postural dysfunction, Pain  Visit Diagnosis: Adhesive capsulitis of left shoulder  History of Erb's palsy     Problem List Patient Active Problem List   Diagnosis Date Noted  . 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 Pregnancy affected by fetal growth restriction 11/28/2016  . Rh negative state in antepartum period 08/02/2016  . Obese 08/02/2016  . Supervision of high risk pregnancy, antepartum 06/01/2016  . History of herpes genitalis 06/01/2016  . Syphilis affecting pregnancy, antepartum 06/01/2016  . Hydradenitis     April Manson , PT, DPT 03/05/2018, 3:43 PM  [facility] [address] [place], [place], [address] Phone: [phone]   Fax:  [phone]  Name: Diamond Burton MRN: [id number] Date of Birth: 1986/05/08

## 2018-03-07 ENCOUNTER — Ambulatory Visit: Payer: Medicaid Other | Admitting: Physical Therapy

## 2018-03-13 ENCOUNTER — Ambulatory Visit: Payer: Medicaid Other | Attending: Neurology | Admitting: Physical Therapy

## 2018-03-13 DIAGNOSIS — Z87898 Personal history of other specified conditions: Secondary | ICD-10-CM

## 2018-03-13 DIAGNOSIS — Z0271 Encounter for disability determination: Secondary | ICD-10-CM

## 2018-03-13 DIAGNOSIS — M7502 Adhesive capsulitis of left shoulder: Secondary | ICD-10-CM | POA: Insufficient documentation

## 2018-03-13 DIAGNOSIS — Z8669 Personal history of other diseases of the nervous system and sense organs: Secondary | ICD-10-CM | POA: Diagnosis not present

## 2018-03-13 NOTE — Therapy (Signed)
[facility] [address] [place], [place], [address] Phone: [phone]   Fax:  [phone]  Physical Therapy Treatment  Patient Details  Name: Diamond Burton MRN: [id number] Date of Birth: [birth year] Referring Provider (PT): Anne Hahn MD   Encounter Date: 03/13/2018  PT End of Session - 03/13/18 1655    Visit Number  3    Number of Visits  12    Date for PT Re-Evaluation  04/05/18    Authorization Type  MCD    Authorization Time Period  3 approved thru 03/29/18    Authorization - Visit Number  2    Authorization - Number of Visits  3    PT Start Time  0430    PT Stop Time  0513    PT Time Calculation (min)  43 min    Activity Tolerance  Patient limited by pain    Behavior During Therapy  [facility] for tasks assessed/performed       Past Medical History:  Diagnosis Date  . Abnormal Pap smear   . Depression    while pregnant was on zoloft but now counseling  . Eczema   . Erb's palsy    L arm  . Erb's palsy    left arm  . Herpes    frequent outbreaks on valtrex 500  . History of chlamydia   . 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 History of gonorrhea   . Hydradenitis   . Infection    UTI  . Kidney stones   . Ovarian cyst   . Periodontal disease   . PID (pelvic inflammatory disease)     Past Surgical History:  Procedure Laterality Date  . NO PAST SURGERIES      There were no vitals filed for this visit.  Subjective Assessment - 03/13/18 1645    Subjective  Pt relays pain is more severe today, she is having outbreak of hydradenitis in her axillary region and thus it is very painful and causing swelling. She is unable to move her arm bc of pain. She will call her dermatologist today about this as she is not supposed to see them until end of october. She She relays the IONTO helped short term but then she was having more sharp pains the next day.    Currently in Pain?  Yes    Pain Score  10-Worst pain ever    Pain Location  Shoulder    Pain Orientation  Left    Pain Descriptors / Indicators  Constant;Burning                       [facility] Adult PT Treatment/Exercise - 03/13/18 0001      Exercises   Exercises  Shoulder      Shoulder Exercises: Isometric Strengthening   Other Isometric Exercises  10 reps  X 5 sec for flex,ext,abd,IR,ER  Gri[place] DG X 25     Modalities   Modalities  Cryotherapy;Electrical Stimulation      Cryotherapy   Number Minutes Cryotherapy  20 Minutes    Cryotherapy Location  Shoulder    Type of Cryotherapy  Ice pack      Electrical Stimulation   Electrical Stimulation Location  shoulder    Electrical Stimulation Action  IFC    Electrical Stimulation Parameters  tolerance    Electrical Stimulation Goals  Tone;Pain             PT Education -  03/13/18 1654    Education Details  new isometric exercises     Person(s) Educated  Patient    Methods  Explanation;Demonstration    Comprehension  Verbalized understanding;Returned demonstration;Need further instruction       PT Short Term Goals - 03/13/18 1709      PT SHORT TERM GOAL #1   Title  Pt will be I and compliant with HEP. 3 weeks 03/15/18    Baseline  not been able to perfrom due to hydradenitis    Status  On-going      PT SHORT TERM GOAL #2   Title  Pt will increase Lt shoulder PROM by 5 deg each plane to improve UE mobility and reaching. 3 weeks 03/15/18    Baseline  no improvements yet    Status  On-going        PT Long Term Goals - 02/22/18 2243      PT LONG TERM GOAL #1   Title  Pt will be I and compliant with final advanced HEP. 6 weeks 04/05/18    Baseline  no HEP until today    Status  New      PT LONG TERM GOAL #2   Title  Pt will improve Lt shoulder flexion and abd ROM >90 deg for reaching. 6 weeks 04/05/18    Baseline  40 deg flexion and 35 deg abd    Status  New      PT LONG TERM GOAL #3   Title  Pt will report overall pain less than 3-4/10 with usual  activity. 6 weeks 04/05/18    Baseline  8-9/10    Status  New            Plan - 03/13/18 1656    Clinical Impression Statement  Pt not able to move her arm away from her body due to pain and flare up of hydradenitis. Instead she was shown isometric shoulder exercises, and gripping exercise to work on until this calms down. She was encouraged to see her MD about this. She was trialed today with CP and E-stim to try to reduce pain and inflammaiton.  She will need [facility] RE next visit.     Rehab Potential  Fair    Clinical Impairments Affecting Rehab Potential  chronic nature of her condition/co-morbidities, hydradenitis    PT Frequency  2x / week   1-2   PT Duration  6 weeks    PT Treatment/Interventions  Cryotherapy;Electrical Stimulation;Iontophoresis 4mg /ml Dexamethasone;Moist Heat;Ultrasound;Therapeutic exercise;Neuromuscular re-education;Manual techniques;Passive range of motion;Dry needling;Taping;Vasopneumatic Device;Joint Manipulations    PT Next Visit Plan  assess response to TENS, Lt UE strength and stretching, isometrics    PT Home Exercise Plan  isometrics, AAROM       Patient will benefit from skilled therapeutic intervention in order to improve the following deficits and impairments:  Decreased activity tolerance, Decreased endurance, Decreased range of motion, Decreased strength, Hypomobility, Impaired flexibility, Increased fascial restricitons, Impaired tone, Postural dysfunction, Pain  Visit Diagnosis: History of Erb's palsy  Adhesive capsulitis of left shoulder     Problem List Patient Active Problem List   Diagnosis Date Noted  . 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 Pregnancy affected by fetal growth restriction 11/28/2016  . Rh negative state in antepartum period 08/02/2016  . Obese 08/02/2016  . Supervision of high risk pregnancy, antepartum 06/01/2016  . History of herpes genitalis 06/01/2016  . Syphilis affecting pregnancy, antepartum 06/01/2016  .  Hydradenitis     Lucille Passy  Delton See, PT, DPT 03/13/2018, 5:16 PM  [facility] [address] [place], [place], [address] Phone: [phone]   Fax:  [phone]  Name: Mikinzie Maciejewski MRN: [id number] Date of Birth: 10-15-1985

## 2018-03-15 ENCOUNTER — Ambulatory Visit: Payer: Medicaid Other | Admitting: Physical Therapy

## 2018-03-20 ENCOUNTER — Other Ambulatory Visit: Payer: Self-pay

## 2018-03-20 ENCOUNTER — Ambulatory Visit: Payer: Medicaid Other | Admitting: Physical Therapy

## 2018-03-20 ENCOUNTER — Encounter: Payer: Self-pay | Admitting: Physical Therapy

## 2018-03-20 DIAGNOSIS — M7502 Adhesive capsulitis of left shoulder: Secondary | ICD-10-CM

## 2018-03-20 DIAGNOSIS — Z8669 Personal history of other diseases of the nervous system and sense organs: Principal | ICD-10-CM

## 2018-03-20 DIAGNOSIS — Z87898 Personal history of other specified conditions: Secondary | ICD-10-CM

## 2018-03-20 NOTE — Therapy (Signed)
[facility] [address] [place], [place], [address] Phone: [phone]   Fax:  [phone]  Physical Therapy Treatment  Patient Details  Name: Diamond Burton MRN: [id number] Date of Birth: May 15, 1986 Referring Provider (PT): Anne Hahn   Encounter Date: 03/20/2018  PT End of Session - 03/20/18 1742    Visit Number  4    Number of Visits  12    Date for PT Re-Evaluation  04/24/18   reflective of progress note today with requesting 8 more PT visits during 4 week period beginning 03/27/2018   Authorization Type  MCD    Authorization Time Period  3 approved thru 03/29/18 (pt. has used all 3 visits as of today's session)    Authorization - Visit Number  3    Authorization - Number of Visits  3    PT Start Time  1630    PT Stop Time  1728    PT Time Calculation (min)  58 min    Activity Tolerance  Patient limited by pain    Behavior During Therapy  [facility] for tasks assessed/performed       Past Medical History:  Diagnosis Date  . Abnormal Pap smear   . Depression    while pregnant was on zoloft but now counseling  . Eczema   . Erb's palsy    L arm  . Erb's palsy    left arm  . Herpes    frequent outbreaks on valtrex 500  . History of chlamydia   . 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 History of gonorrhea   . Hydradenitis   . Infection    UTI  . Kidney stones   . Ovarian cyst   . Periodontal disease   . PID (pelvic inflammatory disease)     Past Surgical History:  Procedure Laterality Date  . NO PAST SURGERIES      There were no vitals filed for this visit.  Subjective Assessment - 03/20/18 1725    Subjective  Pt. reports has been trying to do home exercises but has had difficulty with supine wand exercises for flexion AAROM/stretching due to pain from hydradenitis in axillary region so has been limiting to pendulums and upper trap stretches. Pt sees her dermatologist tomorrow for this.     Pertinent  History  History of Erb's Palsy left UE with recent onset frozen shoulder    Limitations  --   lifting, reaching, activity   Diagnostic tests  EMG of the left arm does confirm the presence of a prior Erb's palsy.    Patient Stated Goals  use my arm better    Currently in Pain?  Yes    Pain Score  8     Pain Location  Shoulder    Pain Orientation  Left    Pain Descriptors / Indicators  Burning    Pain Type  Chronic pain    Pain Radiating Towards  Left arm with left UE parasthesias intermittently into hand    Pain Onset  More than a month ago    Pain Frequency  Intermittent    Aggravating Factors   activity with reaching, lifting, carrying    Pain Relieving Factors  heat, rest         [facility] PT Assessment - 03/20/18 0001      Assessment   Medical Diagnosis  Adhesive capsulitis of left shoulder, Erb's palsey of Lt arm    Referring Provider (PT)  Anne Hahn  Hand Dominance  Right    Prior Therapy  none      Precautions   Precautions  None      AROM   AROM Assessment Site  Shoulder    Right/Left Shoulder  Left    Left Shoulder Flexion  65 Degrees    Left Shoulder ABduction  55 Degrees    Left Shoulder Internal Rotation  --   to hip crest   Left Shoulder External Rotation  --   0 deg     PROM   PROM Assessment Site  Shoulder    Right/Left Shoulder  Left    Left Shoulder Flexion  90 Degrees    Left Shoulder ABduction  70 Degrees    Left Shoulder Internal Rotation  --   40 deg   Left Shoulder External Rotation  20 Degrees      Strength   Overall Strength Comments  elbow 4-/5 MMT    Strength Assessment Site  Shoulder    Right/Left Shoulder  Left    Left Shoulder Flexion  3-/5    Left Shoulder ABduction  3-/5    Left Shoulder Internal Rotation  4/5    Left Shoulder External Rotation  4/5                   [facility] Adult PT Treatment/Exercise - 03/20/18 0001      Exercises   Exercises  Shoulder      Shoulder Exercises: Supine   Other Supine Exercises   supine wand ER stretch x15    Other Supine Exercises  supine flexion AAROM with right UE assist x 10 reps      Shoulder Exercises: Seated   Retraction  20 reps      Shoulder Exercises: Standing   Other Standing Exercises  pendulums X 20 CW/CCW    Other Standing Exercises  standing P-ball rolls for AAROM flexion X 15       Shoulder Exercises: Isometric Strengthening   Other Isometric Exercises  10 reps ea. 5 sec holds for flex, IR, tried ER but stopped after 5 reps and held abduction due to pain      Shoulder Exercises: Stretch   Other Shoulder Stretches  supine left shoulder 4-way passive stretching x   limited by pain from skin condition axillar region   Other Shoulder Stretches  seated left upper trapezius stretch 3x30 sec      Moist Heat Therapy   Number Minutes Moist Heat  10 Minutes    Moist Heat Location  Shoulder      Electrical Stimulation   Electrical Stimulation Location  shoulder    Electrical Stimulation Action  premodulated    Electrical Stimulation Parameters  to tolerance    Electrical Stimulation Goals  Pain      Manual Therapy   Manual Therapy  Soft tissue mobilization    Soft tissue mobilization  Gentle STM left upper trapezius and posterior scapular region             PT Education - 03/20/18 1741    Education Details  etiology frozen shoulder, distinction muscle weakness from Erb's Palsy vs. capsular restriction of left shoulder ROM, shoulder anatomy    Person(s) Educated  Patient    Methods  Explanation;Demonstration;Verbal cues    Comprehension  Verbal cues required;Verbalized understanding       PT Short Term Goals - 03/20/18 1755      PT SHORT TERM GOAL #1   Title  Pt will  be I and compliant with HEP    Baseline  no HEP at baseline-still has difficulty performing stretches due to hydradenitis    Time  2   timeframe updated reflective of progress note   Period  Weeks    Status  On-going      PT SHORT TERM GOAL #2   Title  Pt will  increase Lt shoulder PROM by 5 deg each plane to improve UE mobility and reaching. 3 weeks 03/15/18    Time  3    Period  Weeks    Status  Achieved        PT Long Term Goals - 03/20/18 1756      PT LONG TERM GOAL #1   Title  Pt will be I and compliant with final advanced HEP.    Baseline  no HEP until 02-22-18, still limited with tolerance due to skin issues    Time  4    Period  Weeks    Status  On-going      PT LONG TERM GOAL #2   Title  Pt will improve Lt shoulder flexion and abd ROM >90 deg for reaching.     Baseline  flexion 65 deg (40 deg at eval), abduction 55 deg (35 deg at eval)    Time  4    Period  Weeks    Status  On-going      PT LONG TERM GOAL #3   Title  Pt will report overall pain less than 3-4/10 with usual activity.    Baseline  8-9/10    Time  4    Period  Weeks    Status  On-going      PT LONG TERM GOAL #4   Title  Left shoulder IR reach to at least sacrum, ER AROM increased at least 10-20 deg to improve ability to dress, bathe with left UE use.    Baseline  IR to hip crest, ER 0 deg AROM today    Time  4    Period  Weeks    Status  New            Plan - 03/20/18 1745    Clinical Impression Statement  Pt. presents with left shoulder pain and decreased ROM with combination capsular restriction consistent with adhesive capsulitis along with left shoulder weakness from Erb's Palsy. Status for shoulder/therapy exercises complicated by skin issues from hydradenitis pending further follow up with dermatologist tomorrow. Mild gains with shoulder ROM from baseline-though weakness from Erb's Palsy may ultimately limit progress believe improvement is possible for new onset tightness. Pt. would benefit from continued PT for further progress to address remaining functional limitations.    History and Personal Factors relevant to plan of care:  Erbs' Palsy and skin issues with hydradenitis    Clinical Presentation  Evolving    Clinical Presentation due to:  level  of pain and weakness, complications from skin issues, multifactorial etiology with Erb's Palsy and frozen shoulder    Clinical Decision Making  Moderate    Rehab Potential  Fair    Clinical Impairments Affecting Rehab Potential  underlying Erb's Palsy, comorbidities/skin issues with hydradenitis    PT Frequency  2x / week    PT Duration  4 weeks   4 more weeks beginning 03/27/18   PT Treatment/Interventions  Cryotherapy;Electrical Stimulation;Iontophoresis 4mg /ml Dexamethasone;Moist Heat;Ultrasound;Therapeutic exercise;Neuromuscular re-education;Manual techniques;Passive range of motion;Dry needling;Taping;Vasopneumatic Device;Joint Manipulations    PT Next Visit Plan  continue shoulder stretching/ROM, manual therapy as  tolerated, modalities as needed for pain    PT Home Exercise Plan  isometrics, AAROM, stretching       Patient will benefit from skilled therapeutic intervention in order to improve the following deficits and impairments:  Decreased activity tolerance, Decreased endurance, Decreased range of motion, Decreased strength, Hypomobility, Impaired flexibility, Impaired tone, Postural dysfunction, Pain  Visit Diagnosis: History of Erb's palsy - Plan: PT plan of care cert/re-cert  Adhesive capsulitis of left shoulder - Plan: PT plan of care cert/re-cert     Problem List Patient Active Problem List   Diagnosis Date Noted  . 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 Pregnancy affected by fetal growth restriction 11/28/2016  . Rh negative state in antepartum period 08/02/2016  . Obese 08/02/2016  . Supervision of high risk pregnancy, antepartum 06/01/2016  . History of herpes genitalis 06/01/2016  . Syphilis affecting pregnancy, antepartum 06/01/2016  . Hydradenitis     Lazarus Gowda, PT, DPT 03/20/18 6:09 PM  [facility] [address] [place], [place], [address] Phone: [phone]   Fax:  [phone]  Name: Liahna Brickner MRN: [id number] Date of Birth: [birth year]

## 2018-03-20 NOTE — Therapy (Signed)
[facility] [address] [place], [place], [address] Phone: [phone]   Fax:  [phone]  Physical Therapy Treatment  Patient Details  Name: Diamond Burton MRN: [id number] Date of Birth: 06/02/1986 Referring Provider (PT): Anne Hahn   Encounter Date: 03/20/2018  PT End of Session - 03/20/18 1742    Visit Number  4    Number of Visits  12    Date for PT Re-Evaluation  04/24/18   reflective of progress note today with requesting 8 more PT visits during 4 week period beginning 03/27/2018   Authorization Type  MCD    Authorization Time Period  3 approved thru 03/29/18 (pt. has used all 3 visits as of today's session)    Authorization - Visit Number  3    Authorization - Number of Visits  3    PT Start Time  1630    PT Stop Time  1728    PT Time Calculation (min)  58 min    Activity Tolerance  Patient limited by pain    Behavior During Therapy  [facility] for tasks assessed/performed       Past Medical History:  Diagnosis Date  . Abnormal Pap smear   . Depression    while pregnant was on zoloft but now counseling  . Eczema   . Erb's palsy    L arm  . Erb's palsy    left arm  . Herpes    frequent outbreaks on valtrex 500  . History of chlamydia   . 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 History of gonorrhea   . Hydradenitis   . Infection    UTI  . Kidney stones   . Ovarian cyst   . Periodontal disease   . PID (pelvic inflammatory disease)     Past Surgical History:  Procedure Laterality Date  . NO PAST SURGERIES      There were no vitals filed for this visit.  Subjective Assessment - 03/20/18 1725    Subjective  Pt. reports has been trying to do home exercises but has had difficulty with supine wand exercises for flexion AAROM/stretching due to pain from hydradenitis in axillary region so has been limiting to pendulums and upper trap stretches. Pt sees her dermatologist tomorrow for this.     Pertinent  History  History of Erb's Palsy left UE with recent onset frozen shoulder    Limitations  --   lifting, reaching, activity   Diagnostic tests  EMG of the left arm does confirm the presence of a prior Erb's palsy.    Patient Stated Goals  use my arm better    Currently in Pain?  Yes    Pain Score  8     Pain Location  Shoulder    Pain Orientation  Left    Pain Descriptors / Indicators  Burning    Pain Type  Chronic pain    Pain Radiating Towards  Left arm with left UE parasthesias intermittently into hand    Pain Onset  More than a month ago    Pain Frequency  Intermittent    Aggravating Factors   activity with reaching, lifting, carrying    Pain Relieving Factors  heat, rest         [facility] PT Assessment - 03/20/18 0001      Assessment   Medical Diagnosis  Adhesive capsulitis of left shoulder, Erb's palsey of Lt arm    Referring Provider (PT)  Anne Hahn  Hand Dominance  Right    Prior Therapy  none      Precautions   Precautions  None      AROM   AROM Assessment Site  Shoulder    Right/Left Shoulder  Left    Left Shoulder Flexion  65 Degrees    Left Shoulder ABduction  55 Degrees    Left Shoulder Internal Rotation  --   to hip crest   Left Shoulder External Rotation  --   0 deg     PROM   PROM Assessment Site  Shoulder    Right/Left Shoulder  Left    Left Shoulder Flexion  90 Degrees    Left Shoulder ABduction  70 Degrees    Left Shoulder Internal Rotation  --   40 deg   Left Shoulder External Rotation  20 Degrees      Strength   Overall Strength Comments  elbow 4-/5 MMT    Strength Assessment Site  Shoulder    Right/Left Shoulder  Left    Left Shoulder Flexion  3-/5    Left Shoulder ABduction  3-/5    Left Shoulder Internal Rotation  4/5    Left Shoulder External Rotation  4/5                   [facility] Adult PT Treatment/Exercise - 03/20/18 0001      Exercises   Exercises  Shoulder      Shoulder Exercises: Supine   Other Supine Exercises   supine wand ER stretch x15    Other Supine Exercises  supine flexion AAROM with right UE assist x 10 reps      Shoulder Exercises: Seated   Retraction  20 reps      Shoulder Exercises: Standing   Other Standing Exercises  pendulums X 20 CW/CCW    Other Standing Exercises  standing P-ball rolls for AAROM flexion X 15       Shoulder Exercises: Isometric Strengthening   Other Isometric Exercises  10 reps ea. 5 sec holds for flex, IR, tried ER but stopped after 5 reps and held abduction due to pain      Shoulder Exercises: Stretch   Other Shoulder Stretches  supine left shoulder 4-way passive stretching x   limited by pain from skin condition axillar region   Other Shoulder Stretches  seated left upper trapezius stretch 3x30 sec      Moist Heat Therapy   Number Minutes Moist Heat  10 Minutes    Moist Heat Location  Shoulder      Electrical Stimulation   Electrical Stimulation Location  shoulder    Electrical Stimulation Action  premodulated    Electrical Stimulation Parameters  to tolerance    Electrical Stimulation Goals  Pain      Manual Therapy   Manual Therapy  Soft tissue mobilization    Soft tissue mobilization  Gentle STM left upper trapezius and posterior scapular region             PT Education - 03/20/18 1741    Education Details  etiology frozen shoulder, distinction muscle weakness from Erb's Palsy vs. capsular restriction of left shoulder ROM, shoulder anatomy    Person(s) Educated  Patient    Methods  Explanation;Demonstration;Verbal cues    Comprehension  Verbal cues required;Verbalized understanding       PT Short Term Goals - 03/20/18 1755      PT SHORT TERM GOAL #1   Title  Pt will  be I and compliant with HEP    Baseline  no HEP at baseline-still has difficulty performing stretches due to hydradenitis    Time  2   timeframe updated reflective of progress note   Period  Weeks    Status  On-going      PT SHORT TERM GOAL #2   Title  Pt will  increase Lt shoulder PROM by 5 deg each plane to improve UE mobility and reaching. 3 weeks 03/15/18    Time  3    Period  Weeks    Status  Achieved        PT Long Term Goals - 03/20/18 1756      PT LONG TERM GOAL #1   Title  Pt will be I and compliant with final advanced HEP.    Baseline  no HEP until 02-22-18, still limited with tolerance due to skin issues    Time  4    Period  Weeks    Status  On-going      PT LONG TERM GOAL #2   Title  Pt will improve Lt shoulder flexion and abd ROM >90 deg for reaching.     Baseline  flexion 65 deg (40 deg at eval), abduction 55 deg (35 deg at eval)    Time  4    Period  Weeks    Status  On-going      PT LONG TERM GOAL #3   Title  Pt will report overall pain less than 3-4/10 with usual activity.    Baseline  8-9/10    Time  4    Period  Weeks    Status  On-going      PT LONG TERM GOAL #4   Title  Left shoulder IR reach to at least sacrum, ER AROM increased at least 10-20 deg to improve ability to dress, bathe with left UE use.    Baseline  IR to hip crest, ER 0 deg AROM today    Time  4    Period  Weeks    Status  New            Plan - 03/20/18 1745    Clinical Impression Statement  Pt. presents with left shoulder pain and decreased ROM with combination capsular restriction consistent with adhesive capsulitis along with left shoulder weakness from Erb's Palsy. Status for shoulder/therapy exercises complicated by skin issues from hydradenitis pending further follow up with dermatologist tomorrow. Mild gains with shoulder ROM from baseline-though weakness from Erb's Palsy may ultimately limit progress believe improvement is possible for new onset tightness. Pt. would benefit from continued PT for further progress to address remaining functional limitations.    History and Personal Factors relevant to plan of care:  Erbs' Palsy and skin issues with hydradenitis    Clinical Presentation  Evolving    Clinical Presentation due to:  level  of pain and weakness, complications from skin issues, multifactorial etiology with Erb's Palsy and frozen shoulder    Clinical Decision Making  Moderate    Rehab Potential  Fair    Clinical Impairments Affecting Rehab Potential  underlying Erb's Palsy, comorbidities/skin issues with hydradenitis    PT Frequency  2x / week    PT Duration  4 weeks   4 more weeks beginning 03/27/18   PT Treatment/Interventions  Cryotherapy;Electrical Stimulation;Iontophoresis 4mg /ml Dexamethasone;Moist Heat;Ultrasound;Therapeutic exercise;Neuromuscular re-education;Manual techniques;Passive range of motion;Dry needling;Taping;Vasopneumatic Device;Joint Manipulations    PT Next Visit Plan  continue shoulder stretching/ROM, manual therapy as  tolerated, modalities as needed for pain    PT Home Exercise Plan  isometrics, AAROM, stretching       Patient will benefit from skilled therapeutic intervention in order to improve the following deficits and impairments:  Decreased activity tolerance, Decreased endurance, Decreased range of motion, Decreased strength, Hypomobility, Impaired flexibility, Impaired tone, Postural dysfunction, Pain  Visit Diagnosis: No diagnosis found.     Problem List Patient Active Problem List   Diagnosis Date Noted  . 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 Pregnancy affected by fetal growth restriction 11/28/2016  . Rh negative state in antepartum period 08/02/2016  . Obese 08/02/2016  . Supervision of high risk pregnancy, antepartum 06/01/2016  . History of herpes genitalis 06/01/2016  . Syphilis affecting pregnancy, antepartum 06/01/2016  . Hydradenitis    Lazarus Gowda, PT, DPT 03/20/18 6:04 PM  [facility] [address] [place], [place], [address] Phone: [phone]   Fax:  [phone]  Name: Diamond Burton MRN: [id number] Date of Birth: December 31, 1985

## 2018-03-22 ENCOUNTER — Ambulatory Visit: Payer: Medicaid Other | Admitting: Physical Therapy

## 2018-03-27 ENCOUNTER — Ambulatory Visit: Payer: Medicaid Other | Admitting: Physical Therapy

## 2018-03-29 ENCOUNTER — Encounter: Payer: Medicaid Other | Admitting: Physical Therapy

## 2018-04-02 ENCOUNTER — Ambulatory Visit: Payer: Medicaid Other | Admitting: Physical Therapy

## 2018-04-02 ENCOUNTER — Encounter: Payer: Self-pay | Admitting: Physical Therapy

## 2018-04-02 DIAGNOSIS — Z8669 Personal history of other diseases of the nervous system and sense organs: Secondary | ICD-10-CM | POA: Diagnosis not present

## 2018-04-02 DIAGNOSIS — Z87898 Personal history of other specified conditions: Secondary | ICD-10-CM

## 2018-04-02 DIAGNOSIS — M7502 Adhesive capsulitis of left shoulder: Secondary | ICD-10-CM

## 2018-04-02 NOTE — Therapy (Signed)
[place], [place], [address] Phone: [phone]   Fax:  [phone]  Physical Therapy Treatment  Patient Details  Name: Diamond Burton MRN: [id number] Date of Birth: Jan 12, 1986 Referring Provider (PT): Jannifer Franklin   Encounter Date: 04/02/2018  PT End of Session - 04/02/18 1113    Visit Number  5    Number of Visits  12    Date for PT Re-Evaluation  04/24/18    Authorization Type  MCD    Authorization Time Period  8 visits through 04/26/18    Authorization - Visit Number  5    Authorization - Number of Visits  8    PT Start Time  0174    PT Stop Time  9449    PT Time Calculation (min)  47 min    Activity Tolerance  Patient limited by pain;Patient tolerated treatment well    Behavior During Therapy  [facility] for tasks assessed/performed       Past Medical History:  Diagnosis Date  . Abnormal Pap smear   . Depression    while pregnant was on zoloft but now counseling  . Eczema   . Erb's palsy    L arm  . Erb's palsy    left arm  . Herpes    frequent outbreaks on valtrex 500  . History of chlamydia   . 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 History of gonorrhea   . Hydradenitis   . Infection    UTI  . Kidney stones   . Ovarian cyst   . Periodontal disease   . PID (pelvic inflammatory disease)     Past Surgical History:  Procedure Laterality Date  . NO PAST SURGERIES      There were no vitals filed for this visit.  Subjective Assessment - 04/02/18 1109    Subjective  Pt arriving reporting 9/10 pain. Pt reported that she is still trying to do exercises but is limited by pain. Pt also reporting increased pain and spasms in her L shoulder following her last therapy session. Pt requesting more gentle exercises during today's session. No goals met this treatment. At end of session following E-stim and moist heat to left shoulder pt reporting pain decreased to 7/10.     Pertinent History   History of Erb's Palsy left UE with recent onset frozen shoulder    Diagnostic tests  EMG of the left arm does confirm the presence of a prior Erb's palsy.    Patient Stated Goals  use my arm better    Currently in Pain?  Yes    Pain Score  9     Pain Location  Shoulder    Pain Orientation  Left    Pain Descriptors / Indicators  Aching;Tightness;Burning    Pain Type  Chronic pain    Pain Onset  More than a month ago    Pain Frequency  Intermittent    Aggravating Factors   movement of L UE, lifting, carrying    Pain Relieving Factors  heat, resting    Multiple Pain Sites  No                       [facility] Adult PT Treatment/Exercise - 04/02/18 0001      Exercises   Exercises  Shoulder      Shoulder Exercises: Supine   Other Supine Exercises  supine wand ER stretch x15    Other Supine  Exercises  supine flexion AAROM with right UE assist x 10 reps      Shoulder Exercises: Seated   Row  10 reps      Shoulder Exercises: Standing   Other Standing Exercises  pendulums X 20 CW/CCW      Shoulder Exercises: Pulleys   Flexion  --   1 minute with limited range   Flexion Limitations  attempted pulleys but pt reporting increased pain after 1 minute      Shoulder Exercises: Stretch   Other Shoulder Stretches  supine left shoulder 4-way passive stretching x 45mn   limited by pain from skin condition axillar region     Modalities   Modalities  Moist Heat      Moist Heat Therapy   Number Minutes Moist Heat  15 Minutes    Moist Heat Location  Shoulder      Electrical Stimulation   Electrical Stimulation Location  shoulder    Electrical Stimulation Action  pre-modulated    Electrical Stimulation Parameters  80-150 Hz x 15 minutes, intensity to pt's tolrance    Electrical Stimulation Goals  Tone;Pain      Manual Therapy   Manual Therapy  Passive ROM    Passive ROM  L shoulder flexion, ER, IR, abduction   limited by pain in axillary region            PT  Education - 04/02/18 1112    Education Details  Reviewed verbally HEP and techniques    Person(s) Educated  Patient    Methods  Explanation;Demonstration    Comprehension  Verbalized understanding;Returned demonstration       PT Short Term Goals - 04/02/18 1116      PT SHORT TERM GOAL #1   Title  Pt will be I and compliant with HEP    Baseline  stretches, pendulems reviewed    Time  2    Period  Weeks    Status  On-going      PT SHORT TERM GOAL #2   Title  Pt will increase Lt shoulder PROM by 5 deg each plane to improve UE mobility and reaching. 3 weeks 03/15/18    Time  3    Period  Weeks    Status  Achieved        PT Long Term Goals - 04/02/18 1117      PT LONG TERM GOAL #1   Title  Pt will be I and compliant with final advanced HEP.    Time  4    Period  Weeks    Status  On-going      PT LONG TERM GOAL #2   Title  Pt will improve Lt shoulder flexion and abd ROM >90 deg for reaching.     Baseline  flexion 65 deg (40 deg at eval), abduction 55 deg (35 deg at eval)    Time  4    Period  Weeks    Status  On-going      PT LONG TERM GOAL #3   Title  Pt will report overall pain less than 3-4/10 with usual activity.    Baseline  8-9/10    Time  4    Period  Weeks      PT LONG TERM GOAL #4   Title  Left shoulder IR reach to at least sacrum, ER AROM increased at least 10-20 deg to improve ability to dress, bathe with left UE use.    Baseline  IR to hip  crest, ER 0 deg AROM today    Period  Weeks    Status  New            Plan - 04/02/18 1114    Clinical Impression Statement  Pt presenting with left shoulder pain of 9/10 today. Pt reporting limitation in her HEP due to pain. Pt able to demonstrate and verbally recall her HEP. Pt with limitations today due to increased pain with PROM and manual therpay. Continue to progress toward pt's goals set with 3 more visits approved.     Rehab Potential  Fair    Clinical Impairments Affecting Rehab Potential  underlying  Erb's Palsy, comorbidities/skin issues with hydradenitis    PT Frequency  2x / week    PT Duration  4 weeks    PT Treatment/Interventions  Cryotherapy;Electrical Stimulation;Iontophoresis '4mg'$ /ml Dexamethasone;Moist Heat;Ultrasound;Therapeutic exercise;Neuromuscular re-education;Manual techniques;Passive range of motion;Dry needling;Taping;Vasopneumatic Device;Joint Manipulations    PT Next Visit Plan  continue shoulder stretching/ROM, manual therapy as tolerated, modalities as needed for pain    PT Home Exercise Plan  isometrics, AAROM, stretching    Consulted and Agree with Plan of Care  Patient       Patient will benefit from skilled therapeutic intervention in order to improve the following deficits and impairments:  Decreased activity tolerance, Decreased endurance, Decreased range of motion, Decreased strength, Hypomobility, Impaired flexibility, Impaired tone, Postural dysfunction, Pain  Visit Diagnosis: History of Erb's palsy  Adhesive capsulitis of left shoulder     Problem List Patient Active Problem List   Diagnosis Date Noted  . 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 Pregnancy affected by fetal growth restriction 11/28/2016  . Rh negative state in antepartum period 08/02/2016  . Obese 08/02/2016  . Supervision of high risk pregnancy, antepartum 06/01/2016  . History of herpes genitalis 06/01/2016  . Syphilis affecting pregnancy, antepartum 06/01/2016  . Hydradenitis     Oretha Caprice , PT 04/02/2018, 11:23 AM  [facility] [address] [place], [place], [address] Phone: [phone]   Fax:  [phone]  Name: Diamond Burton MRN: [id number] Date of Birth: [birth year]

## 2018-04-04 ENCOUNTER — Ambulatory Visit: Payer: Medicaid Other

## 2018-04-04 DIAGNOSIS — Z8669 Personal history of other diseases of the nervous system and sense organs: Principal | ICD-10-CM

## 2018-04-04 DIAGNOSIS — Z87898 Personal history of other specified conditions: Secondary | ICD-10-CM

## 2018-04-04 DIAGNOSIS — M7502 Adhesive capsulitis of left shoulder: Secondary | ICD-10-CM

## 2018-04-04 NOTE — Therapy (Signed)
[place], [place], [address] Phone: [phone]   Fax:  [phone]  Physical Therapy Treatment  Patient Details  Name: Diamond Burton MRN: [id number] Date of Birth: 05/31/1986 Referring Provider (PT): Jannifer Franklin   Encounter Date: 04/04/2018  PT End of Session - 04/04/18 1141    Visit Number  6    Number of Visits  12    Date for PT Re-Evaluation  04/24/18    Authorization Type  MCD    Authorization Time Period  8 visits through 04/26/18    Authorization - Visit Number  6    Authorization - Number of Visits  8    PT Start Time  1100    PT Stop Time  9574    PT Time Calculation (min)  49 min    Activity Tolerance  Patient limited by pain;Patient tolerated treatment well       Past Medical History:  Diagnosis Date  . Abnormal Pap smear   . Depression    while pregnant was on zoloft but now counseling  . Eczema   . Erb's palsy    L arm  . Erb's palsy    left arm  . Herpes    frequent outbreaks on valtrex 500  . History of chlamydia   . 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 History of gonorrhea   . Hydradenitis   . Infection    UTI  . Kidney stones   . Ovarian cyst   . Periodontal disease   . PID (pelvic inflammatory disease)     Past Surgical History:  Procedure Laterality Date  . NO PAST SURGERIES      There were no vitals filed for this visit.  Subjective Assessment - 04/04/18 1107    Subjective  Reports no change in symptoms since starting therapy.  States still trying exercises when no swelling under her arm.  Tries once a day, but twice a day was too much given medical issues (hidrenditis).      Pertinent History  History of Erb's Palsy left UE with recent onset frozen shoulder    Diagnostic tests  EMG of the left arm does confirm the presence of a prior Erb's palsy.    Patient Stated Goals  use my arm better    Pain Score  9     Pain Location  Shoulder    Pain  Orientation  Left    Pain Descriptors / Indicators  Aching;Shooting    Pain Type  Chronic pain    Pain Radiating Towards  shoulder blade    Pain Onset  More than a month ago    Pain Frequency  Intermittent   stiffness and soreness there all the time, but shooting pain comes and goest   Aggravating Factors   laying on it sometimes, carrying    Pain Relieving Factors  heat and estim                       [facility] Adult PT Treatment/Exercise - 04/04/18 1112      Exercises   Exercises  Shoulder      Shoulder Exercises: Pulleys   Flexion  1 minute    Flexion Limitations  standing pendulums      Shoulder Exercises: Stretch   Other Shoulder Stretches  seated table top flexion x 20      Modalities   Modalities  Moist Heat  Moist Heat Therapy   Number Minutes Moist Heat  15 Minutes    Moist Heat Location  Shoulder      Electrical Stimulation   Electrical Stimulation Location  shoulder    Electrical Stimulation Action  pre-mod    Electrical Stimulation Parameters  80-150 Hz, 15 min, intensity as tolerated    Electrical Stimulation Goals  Pain;Tone      Manual Therapy   Manual Therapy  Passive ROM;Joint mobilization    Joint Mobilization  scapular mobs in sidelying pro/ret; elev/depr; [place] mobs inferior to promote flexion/abd    Soft tissue mobilization  STM periscapular area    Passive ROM  L shoulder flex, ER, abd/hoizontal abd             PT Education - 04/04/18 1140    Education Details  Verbally reviewed HEP and lifting tips for childcare    Person(s) Educated  Patient    Methods  Explanation    Comprehension  Verbalized understanding       PT Short Term Goals - 04/02/18 1116      PT SHORT TERM GOAL #1   Title  Pt will be I and compliant with HEP    Baseline  stretches, pendulems reviewed    Time  2    Period  Weeks    Status  On-going      PT SHORT TERM GOAL #2   Title  Pt will increase Lt shoulder PROM by 5 deg each plane to improve UE  mobility and reaching. 3 weeks 03/15/18    Time  3    Period  Weeks    Status  Achieved        PT Long Term Goals - 04/02/18 1117      PT LONG TERM GOAL #1   Title  Pt will be I and compliant with final advanced HEP.    Time  4    Period  Weeks    Status  On-going      PT LONG TERM GOAL #2   Title  Pt will improve Lt shoulder flexion and abd ROM >90 deg for reaching.     Baseline  flexion 65 deg (40 deg at eval), abduction 55 deg (35 deg at eval)    Time  4    Period  Weeks    Status  On-going      PT LONG TERM GOAL #3   Title  Pt will report overall pain less than 3-4/10 with usual activity.    Baseline  8-9/10    Time  4    Period  Weeks      PT LONG TERM GOAL #4   Title  Left shoulder IR reach to at least sacrum, ER AROM increased at least 10-20 deg to improve ability to dress, bathe with left UE use.    Baseline  IR to hip crest, ER 0 deg AROM today    Period  Weeks    Status  New            Plan - 04/04/18 1142    Clinical Impression Statement  Patient continues with L shoulder pain and limitations despite progressing therapy.  This session focused more on PROM/joint mobs and STM to improve ROM and mobility.  She remains limited with measurements not taken this session.  Will continue skilled PT to progress as tolerated with POC.    Rehab Potential  Fair    Clinical Impairments Affecting Rehab Potential  underlying Erb's  Palsy, comorbidities/skin issues with hydradenitis    PT Frequency  2x / week    PT Duration  4 weeks    PT Treatment/Interventions  Cryotherapy;Electrical Stimulation;Iontophoresis 15m/ml Dexamethasone;Moist Heat;Ultrasound;Therapeutic exercise;Neuromuscular re-education;Manual techniques;Passive range of motion;Dry needling;Taping;Vasopneumatic Device;Joint Manipulations    PT Next Visit Plan  hard review of HEP in clinic and progress if tolerated    PT Home Exercise Plan  isometrics, AAROM, stretching    Consulted and Agree with Plan of Care   Patient       Patient will benefit from skilled therapeutic intervention in order to improve the following deficits and impairments:  Decreased activity tolerance, Decreased endurance, Decreased range of motion, Decreased strength, Hypomobility, Impaired flexibility, Impaired tone, Postural dysfunction, Pain  Visit Diagnosis: History of Erb's palsy  Adhesive capsulitis of left shoulder     Problem List Patient Active Problem List   Diagnosis Date Noted  . 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 Pregnancy affected by fetal growth restriction 11/28/2016  . Rh negative state in antepartum period 08/02/2016  . Obese 08/02/2016  . Supervision of high risk pregnancy, antepartum 06/01/2016  . History of herpes genitalis 06/01/2016  . Syphilis affecting pregnancy, antepartum 06/01/2016  . Hydradenitis     CReginia Naas PT 04/04/2018, 11:56 AM  [facility] [address]Phone: [phone]  Fax:  [phone] Name: TLurlene RondaMRN: [id number] of Birth: 1Apr 13, 1987

## 2018-04-11 ENCOUNTER — Ambulatory Visit: Payer: Medicaid Other | Admitting: Physical Therapy

## 2018-04-11 ENCOUNTER — Encounter: Payer: Self-pay | Admitting: Physical Therapy

## 2018-04-11 DIAGNOSIS — M7502 Adhesive capsulitis of left shoulder: Secondary | ICD-10-CM

## 2018-04-11 DIAGNOSIS — Z8669 Personal history of other diseases of the nervous system and sense organs: Principal | ICD-10-CM

## 2018-04-11 DIAGNOSIS — Z87898 Personal history of other specified conditions: Secondary | ICD-10-CM

## 2018-04-11 NOTE — Therapy (Signed)
[facility] Outpatient Rehabilitation [facility] [address] [place], [place], [address] Phone: [phone]   Fax:  [phone]  Physical Therapy Treatment  Patient Details  Name: Diamond Burton MRN: [id number] Date of Birth: 06/06/1986 Referring Provider (PT): Anne Hahn   Encounter Date: 04/11/2018  PT End of Session - 04/11/18 0952    Visit Number  7    Number of Visits  12    Date for PT Re-Evaluation  04/24/18    Authorization Type  MCD    Authorization Time Period  8 visits 03/30/18-04/26/18    Authorization - Visit Number  3    Authorization - Number of Visits  8    PT Start Time  0929    PT Stop Time  1027    PT Time Calculation (min)  58 min       Past Medical History:  Diagnosis Date  . Abnormal Pap smear   . Depression    while pregnant was on zoloft but now counseling  . Eczema   . Erb's palsy    L arm  . Erb's palsy    left arm  . Herpes    frequent outbreaks on valtrex 500  . History of chlamydia   . 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 History of gonorrhea   . Hydradenitis   . Infection    UTI  . Kidney stones   . Ovarian cyst   . Periodontal disease   . PID (pelvic inflammatory disease)     Past Surgical History:  Procedure Laterality Date  . NO PAST SURGERIES      There were no vitals filed for this visit.  Subjective Assessment - 04/11/18 0932    Subjective  Continues with left shoulder pain/stiffness and issues with hydradenitis left axillary region. Reports had some difficulty driving briefly after last session due to soreness, hard to tell if improving.     Currently in Pain?  Yes    Pain Score  8     Pain Location  Shoulder    Pain Orientation  Left    Pain Descriptors / Indicators  Sore;Cramping   stiff   Pain Type  Chronic pain    Pain Radiating Towards  scapula    Pain Onset  More than a month ago    Pain Frequency  Constant    Aggravating Factors   actvivity    Pain Relieving Factors  heat and estim     Effect of Pain on Daily Activities  limited tolerance reaching ADLs, difficulty lifting and carrying her child                       [facility] Adult PT Treatment/Exercise - 04/11/18 0001      Exercises   Exercises  Shoulder;Elbow      Elbow Exercises   Elbow Flexion  --   20 reps in standing     Shoulder Exercises: Supine   Other Supine Exercises  supine wand ER stretch 5 sec x 1      Shoulder Exercises: Seated   Other Seated Exercises  seated UE ranger ER/IR and flexion x 20 ea.      Shoulder Exercises: Sidelying   External Rotation  AROM;Left;20 reps      Shoulder Exercises: Standing   Other Standing Exercises  --   rows x 20 cues to flex elbow and avoid shrug   Other Standing Exercises  P-ball flexion AAROM on high table  x 10 reps      Moist Heat Therapy   Number Minutes Moist Heat  15 Minutes   also MHP x 5 min pre-tx.   Moist Heat Location  Shoulder      Electrical Stimulation   Electrical Stimulation Location  shoulder    Electrical Stimulation Action  premodulated    Electrical Stimulation Parameters  80-150 HZ x 15 minutes    Electrical Stimulation Goals  Tone;Pain      Manual Therapy   Manual Therapy  Joint mobilization;Soft tissue mobilization;Passive ROM    Joint Mobilization  scapular mobilization in right sidelying    Passive ROM  L shoulder 4-way PROM             PT Education - 04/11/18 0934    Education Details  POC    Person(s) Educated  Patient    Methods  Explanation    Comprehension  Verbalized understanding       PT Short Term Goals - 04/02/18 1116      PT SHORT TERM GOAL #1   Title  Pt will be I and compliant with HEP    Baseline  stretches, pendulems reviewed    Time  2    Period  Weeks    Status  On-going      PT SHORT TERM GOAL #2   Title  Pt will increase Lt shoulder PROM by 5 deg each plane to improve UE mobility and reaching. 3 weeks 03/15/18    Time  3    Period  Weeks    Status  Achieved        PT  Long Term Goals - 04/02/18 1117      PT LONG TERM GOAL #1   Title  Pt will be I and compliant with final advanced HEP.    Time  4    Period  Weeks    Status  On-going      PT LONG TERM GOAL #2   Title  Pt will improve Lt shoulder flexion and abd ROM >90 deg for reaching.     Baseline  flexion 65 deg (40 deg at eval), abduction 55 deg (35 deg at eval)    Time  4    Period  Weeks    Status  On-going      PT LONG TERM GOAL #3   Title  Pt will report overall pain less than 3-4/10 with usual activity.    Baseline  8-9/10    Time  4    Period  Weeks      PT LONG TERM GOAL #4   Title  Left shoulder IR reach to at least sacrum, ER AROM increased at least 10-20 deg to improve ability to dress, bathe with left UE use.    Baseline  IR to hip crest, ER 0 deg AROM today    Period  Weeks    Status  New            Plan - 04/11/18 0954    Clinical Impression Statement  Continues with significant shoulder stiffness. Difficulty tolerating ROM/stretching due to high pain level with hydradenitis and contributing factor.    PT Frequency  2x / week    PT Duration  4 weeks    PT Treatment/Interventions  Cryotherapy;Electrical Stimulation;Iontophoresis 4mg /ml Dexamethasone;Moist Heat;Ultrasound;Therapeutic exercise;Neuromuscular re-education;Manual techniques;Passive range of motion;Dry needling;Taping;Vasopneumatic Device;Joint Manipulations    PT Next Visit Plan  Continue ROM, manual tx. and progress as tolerated pending pain  PT Home Exercise Plan  isometrics, AAROM, stretching    Consulted and Agree with Plan of Care  Patient       Patient will benefit from skilled therapeutic intervention in order to improve the following deficits and impairments:  Decreased activity tolerance, Decreased endurance, Decreased range of motion, Decreased strength, Hypomobility, Impaired flexibility, Impaired tone, Postural dysfunction, Pain  Visit Diagnosis: History of Erb's palsy  Adhesive capsulitis  of left shoulder     Problem List Patient Active Problem List   Diagnosis Date Noted  . 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 Pregnancy affected by fetal growth restriction 11/28/2016  . Rh negative state in antepartum period 08/02/2016  . Obese 08/02/2016  . Supervision of high risk pregnancy, antepartum 06/01/2016  . History of herpes genitalis 06/01/2016  . Syphilis affecting pregnancy, antepartum 06/01/2016  . Hydradenitis     Lazarus Gowda, PT, DPT 04/11/18 10:14 AM  [facility] [address] [place], [place], [address] Phone: [phone]   Fax:  [phone]  Name: Diamond Burton MRN: [id number] Date of Birth: 21-Dec-1985

## 2018-04-13 ENCOUNTER — Encounter: Payer: Self-pay | Admitting: Physical Therapy

## 2018-04-13 ENCOUNTER — Ambulatory Visit: Payer: Medicaid Other | Attending: Neurology | Admitting: Physical Therapy

## 2018-04-13 DIAGNOSIS — M7502 Adhesive capsulitis of left shoulder: Secondary | ICD-10-CM | POA: Insufficient documentation

## 2018-04-13 DIAGNOSIS — Z8669 Personal history of other diseases of the nervous system and sense organs: Secondary | ICD-10-CM | POA: Insufficient documentation

## 2018-04-13 DIAGNOSIS — Z87898 Personal history of other specified conditions: Secondary | ICD-10-CM

## 2018-04-13 NOTE — Therapy (Signed)
[facility] Outpatient Rehabilitation [facility] [address] [place], [place], [address] Phone: [phone]   Fax:  [phone]  Physical Therapy Treatment  Patient Details  Name: Diamond Burton MRN: [id number] Date of Birth: 01-Aug-1985 Referring Provider (PT): Anne Hahn   Encounter Date: 04/13/2018  PT End of Session - 04/13/18 0929    Visit Number  8    Number of Visits  12    Date for PT Re-Evaluation  04/24/18    Authorization Type  MCD    Authorization Time Period  8 visits 03/30/18-04/26/18    Authorization - Visit Number  4    Authorization - Number of Visits  8    PT Start Time  0847    PT Stop Time  0943    PT Time Calculation (min)  56 min    Activity Tolerance  --   still limited by pain but improved tolerance from last session   Behavior During Therapy  [facility] for tasks assessed/performed       Past Medical History:  Diagnosis Date  . Abnormal Pap smear   . Depression    while pregnant was on zoloft but now counseling  . Eczema   . Erb's palsy    L arm  . Erb's palsy    left arm  . Herpes    frequent outbreaks on valtrex 500  . History of chlamydia   . 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 History of gonorrhea   . Hydradenitis   . Infection    UTI  . Kidney stones   . Ovarian cyst   . Periodontal disease   . PID (pelvic inflammatory disease)     Past Surgical History:  Procedure Laterality Date  . NO PAST SURGERIES      There were no vitals filed for this visit.  Subjective Assessment - 04/13/18 0854    Subjective  Some soreness after last session. Pain 8/10 today with report of "aching" from weather system last evening. Discussed possible trial dry needling and pt. wishes to think about it-she was provided information handout on this.         [facility] PT Assessment - 04/13/18 0001      AROM   AROM Assessment Site  Shoulder    Right/Left Shoulder  Left    Left Shoulder Flexion  70 Degrees    Left Shoulder ABduction   50 Degrees    Left Shoulder Internal Rotation  --   reach to hip crest   Left Shoulder External Rotation  0 Degrees                   [facility] Adult PT Treatment/Exercise - 04/13/18 0001      Exercises   Exercises  Shoulder      Shoulder Exercises: Supine   Other Supine Exercises  supine wand ER x 15      Shoulder Exercises: Sidelying   External Rotation  AROM;Left;20 reps      Shoulder Exercises: Standing   Retraction  20 reps    Other Standing Exercises  pendulums x 30 ea. way CW/CCW    Other Standing Exercises  P-ball flexion AAROM on high table x 20      Moist Heat Therapy   Number Minutes Moist Heat  --   x 8 min pre-tx. then x 15 min MHP with estim post-tx.   Moist Heat Location  Shoulder      Electrical Stimulation   Electrical Stimulation  Location  shoulder    Electrical Stimulation Action  premodulated    Electrical Stimulation Parameters  80-150 HZ x 15 min with MHP    Electrical Stimulation Goals  Tone;Pain      Manual Therapy   Manual Therapy  Joint mobilization;Passive ROM    Passive ROM  L shoulder 4-way PROM             PT Education - 04/13/18 0929    Education Details  pain education, dry needling    Person(s) Educated  Patient    Methods  Explanation;Handout    Comprehension  Verbalized understanding       PT Short Term Goals - 04/02/18 1116      PT SHORT TERM GOAL #1   Title  Pt will be I and compliant with HEP    Baseline  stretches, pendulems reviewed    Time  2    Period  Weeks    Status  On-going      PT SHORT TERM GOAL #2   Title  Pt will increase Lt shoulder PROM by 5 deg each plane to improve UE mobility and reaching. 3 weeks 03/15/18    Time  3    Period  Weeks    Status  Achieved        PT Long Term Goals - 04/13/18 0931      PT LONG TERM GOAL #2   Title  Pt will improve Lt shoulder flexion and abd ROM >90 deg for reaching.     Baseline  flexion 70 deg, abd 50 deg    Time  4    Period  Weeks    Status   On-going            Plan - 04/13/18 0930    Clinical Impression Statement  Still sore and limited with ROM but improved tx. tolerance today with pain education and use MHP pre-tx. More sore/limited in stretching for abduction and flexion than ER/IR.    Clinical Impairments Affecting Rehab Potential  underlying Erb's Palsy, comorbidities/skin issues with hydradenitis    PT Frequency  2x / week    PT Duration  2 weeks    PT Treatment/Interventions  Cryotherapy;Electrical Stimulation;Iontophoresis 4mg /ml Dexamethasone;Moist Heat;Ultrasound;Therapeutic exercise;Neuromuscular re-education;Manual techniques;Passive range of motion;Dry needling;Taping;Vasopneumatic Device;Joint Manipulations    PT Next Visit Plan  MHP pre-tx. Continue ROM, manual tx. and progress as tolerated pending pain    PT Home Exercise Plan  isometrics, AAROM, stretching    Consulted and Agree with Plan of Care  Patient       Patient will benefit from skilled therapeutic intervention in order to improve the following deficits and impairments:  Decreased activity tolerance, Decreased endurance, Decreased range of motion, Decreased strength, Hypomobility, Impaired flexibility, Impaired tone, Postural dysfunction, Pain  Visit Diagnosis: History of Erb's palsy  Adhesive capsulitis of left shoulder     Problem List Patient Active Problem List   Diagnosis Date Noted  . 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 Pregnancy affected by fetal growth restriction 11/28/2016  . Rh negative state in antepartum period 08/02/2016  . Obese 08/02/2016  . Supervision of high risk pregnancy, antepartum 06/01/2016  . History of herpes genitalis 06/01/2016  . Syphilis affecting pregnancy, antepartum 06/01/2016  . Hydradenitis     Lazarus Gowda, PT, DPT 04/13/18 9:33 AM  [facility] [address] [place], [place], [address] Phone: [phone]   Fax:  [phone]  Name:  Diamond Burton MRN: [id number]  Date of Birth: [birth year]

## 2018-04-16 ENCOUNTER — Ambulatory Visit: Payer: Medicaid Other | Admitting: Physical Therapy

## 2018-04-16 DIAGNOSIS — Z8669 Personal history of other diseases of the nervous system and sense organs: Secondary | ICD-10-CM

## 2018-04-16 DIAGNOSIS — Z87898 Personal history of other specified conditions: Secondary | ICD-10-CM

## 2018-04-16 DIAGNOSIS — M7502 Adhesive capsulitis of left shoulder: Secondary | ICD-10-CM

## 2018-04-16 NOTE — Therapy (Signed)
[facility] Outpatient Rehabilitation [facility] [address] [place], [place], [address] Phone: [phone]   Fax:  [phone]  Physical Therapy Treatment  Patient Details  Name: Diamond Burton MRN: [id number] Date of Birth: [birth year] Referring Provider (PT): Anne Hahn   Encounter Date: 04/16/2018  PT End of Session - 04/16/18 1005    Visit Number  9    Number of Visits  12    Date for PT Re-Evaluation  04/24/18    Authorization Type  MCD    Authorization Time Period  8 visits 03/30/18-04/26/18    Authorization - Visit Number  5    Authorization - Number of Visits  8    PT Start Time  0935    PT Stop Time  1015    PT Time Calculation (min)  40 min    Activity Tolerance  Patient limited by pain    Behavior During Therapy  [facility] for tasks assessed/performed       Past Medical History:  Diagnosis Date  . Abnormal Pap smear   . Depression    while pregnant was on zoloft but now counseling  . Eczema   . Erb's palsy    L arm  . Erb's palsy    left arm  . Herpes    frequent outbreaks on valtrex 500  . History of chlamydia   . 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 History of gonorrhea   . Hydradenitis   . Infection    UTI  . Kidney stones   . Ovarian cyst   . Periodontal disease   . PID (pelvic inflammatory disease)     Past Surgical History:  Procedure Laterality Date  . NO PAST SURGERIES      There were no vitals filed for this visit.  Subjective Assessment - 04/16/18 0942    Subjective  Pt relays he was in a lot of pain after last session and has been in more pain since.    Pertinent History  History of Erb's Palsy left UE with recent onset frozen shoulder    Diagnostic tests  EMG of the left arm does confirm the presence of a prior Erb's palsy.    Patient Stated Goals  use my arm better    Currently in Pain?  Yes    Pain Score  9     Pain Location  Shoulder    Pain Orientation  Left    Pain Descriptors / Indicators  Sore;Tightness     Pain Type  Chronic pain    Pain Frequency  Constant                       [facility] Adult PT Treatment/Exercise - 04/16/18 0001      Exercises   Exercises  Shoulder      Shoulder Exercises: Supine   Other Supine Exercises  supine wand ER and flexion x 15      Shoulder Exercises: Sidelying   External Rotation  AROM;Left;20 reps      Shoulder Exercises: Standing   Retraction  20 reps    Other Standing Exercises  pendulums x 20 ea. way CW/CCW, ant-post, lateral    Other Standing Exercises  P-ball flexion then scaption AAROM on high table x 20      Moist Heat Therapy   Number Minutes Moist Heat  --   7 min pre tx, 15 min post tx with TENS   Moist Heat Location  Shoulder  Public affairs consultant  IFC    Electrical Stimulation Parameters  80-150 HZ X 15 min    Electrical Stimulation Goals  Tone;Pain      Manual Therapy   Manual therapy comments  held today as she "did not want her arm pulled"               PT Short Term Goals - 04/02/18 1116      PT SHORT TERM GOAL #1   Title  Pt will be I and compliant with HEP    Baseline  stretches, pendulems reviewed    Time  2    Period  Weeks    Status  On-going      PT SHORT TERM GOAL #2   Title  Pt will increase Lt shoulder PROM by 5 deg each plane to improve UE mobility and reaching. 3 weeks 03/15/18    Time  3    Period  Weeks    Status  Achieved        PT Long Term Goals - 04/13/18 0931      PT LONG TERM GOAL #2   Title  Pt will improve Lt shoulder flexion and abd ROM >90 deg for reaching.     Baseline  flexion 70 deg, abd 50 deg    Time  4    Period  Weeks    Status  On-going            Plan - 04/16/18 1007    Clinical Impression Statement  Pt had increased pain and soreness today and requested MT be held. Session instead focused on AAROM and AROM to tolerance with cues to stay in pain free ROM. MHP and  TENS again applied as she does express some relief with this. She was encouraged to continue to stretch her arm as much as she can at home to tolerance.     Rehab Potential  Fair    Clinical Impairments Affecting Rehab Potential  underlying Erb's Palsy, comorbidities/skin issues with hydradenitis    PT Frequency  2x / week    PT Duration  2 weeks    PT Treatment/Interventions  Cryotherapy;Electrical Stimulation;Iontophoresis 4mg /ml Dexamethasone;Moist Heat;Ultrasound;Therapeutic exercise;Neuromuscular re-education;Manual techniques;Passive range of motion;Dry needling;Taping;Vasopneumatic Device;Joint Manipulations    PT Next Visit Plan  MHP pre-tx. Continue ROM, manual tx. and progress as tolerated pending pain    PT Home Exercise Plan  isometrics, AAROM, stretching    Consulted and Agree with Plan of Care  Patient       Patient will benefit from skilled therapeutic intervention in order to improve the following deficits and impairments:  Decreased activity tolerance, Decreased endurance, Decreased range of motion, Decreased strength, Hypomobility, Impaired flexibility, Impaired tone, Postural dysfunction, Pain  Visit Diagnosis: Adhesive capsulitis of left shoulder  History of Erb's palsy     Problem List Patient Active Problem List   Diagnosis Date Noted  . 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 Pregnancy affected by fetal growth restriction 11/28/2016  . Rh negative state in antepartum period 08/02/2016  . Obese 08/02/2016  . Supervision of high risk pregnancy, antepartum 06/01/2016  . History of herpes genitalis 06/01/2016  . Syphilis affecting pregnancy, antepartum 06/01/2016  . Hydradenitis     April Manson, PT, DPT 04/16/2018, 10:10 AM  [facility] [address] [place], [place], [address] Phone: [phone]   Fax:  [phone]  Name: Diamond  Burton MRN: [id number] Date of Birth: 06-28-1985

## 2018-04-18 ENCOUNTER — Encounter: Payer: Self-pay | Admitting: Physical Therapy

## 2018-04-18 ENCOUNTER — Ambulatory Visit: Payer: Medicaid Other | Admitting: Physical Therapy

## 2018-04-18 DIAGNOSIS — Z8669 Personal history of other diseases of the nervous system and sense organs: Secondary | ICD-10-CM

## 2018-04-18 DIAGNOSIS — Z87898 Personal history of other specified conditions: Secondary | ICD-10-CM

## 2018-04-18 DIAGNOSIS — M7502 Adhesive capsulitis of left shoulder: Secondary | ICD-10-CM

## 2018-04-18 NOTE — Therapy (Signed)
[facility] Outpatient Rehabilitation [facility] [address] [place], [place], [address] Phone: [phone]   Fax:  [phone]  Physical Therapy Treatment  Patient Details  Name: Diamond Burton MRN: [id number] Date of Birth: [birth year] Referring Provider (PT): Anne Hahn   Encounter Date: 04/18/2018  PT End of Session - 04/18/18 1203    Visit Number  10    Number of Visits  12    Authorization Type  MCD    Authorization Time Period  8 visits 03/30/18-04/26/18    Authorization - Visit Number  6    Authorization - Number of Visits  8    PT Start Time  1157    PT Stop Time  1245    PT Time Calculation (min)  48 min       Past Medical History:  Diagnosis Date  . Abnormal Pap smear   . Depression    while pregnant was on zoloft but now counseling  . Eczema   . Erb's palsy    L arm  . Erb's palsy    left arm  . Herpes    frequent outbreaks on valtrex 500  . History of chlamydia   . 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 History of gonorrhea   . Hydradenitis   . Infection    UTI  . Kidney stones   . Ovarian cyst   . Periodontal disease   . PID (pelvic inflammatory disease)     Past Surgical History:  Procedure Laterality Date  . NO PAST SURGERIES      There were no vitals filed for this visit.  Subjective Assessment - 04/18/18 1201    Subjective  I had to slow down on letting them pull on my arm. It causes shooting pain in my arm and then I have to pull over while I am driving.     Currently in Pain?  Yes    Pain Score  8     Pain Location  Shoulder    Pain Orientation  Left    Pain Descriptors / Indicators  Sore;Tightness    Aggravating Factors   holding baby, gripping things, picking up things, driving    Pain Relieving Factors  heat and stim                        [facility] Adult PT Treatment/Exercise - 04/18/18 0001      Exercises   Exercises  Shoulder      Shoulder Exercises: Supine   Other Supine Exercises   supine wand and clasped hand pressups and pullovers       Shoulder Exercises: Standing   Other Standing Exercises  UE ranger standing flexion, horizontals and ER/IR       Shoulder Exercises: Pulleys   Flexion  2 minutes    Flexion Limitations  cues to decrease lean over.       Shoulder Exercises: Stretch   Table Stretch - Flexion  10 seconds;5 reps    Table Stretch - Abduction  10 seconds;3 reps    Table Stretch - External Rotation  10 seconds;5 reps      Moist Heat Therapy   Number Minutes Moist Heat  15 Minutes    Moist Heat Location  Shoulder      Electrical Stimulation   Electrical Stimulation Location  shoulder    Electrical Stimulation Action  IFC    Electrical Stimulation Parameters  5 ma     Electrical  Stimulation Goals  Tone;Pain               PT Short Term Goals - 04/02/18 1116      PT SHORT TERM GOAL #1   Title  Pt will be I and compliant with HEP    Baseline  stretches, pendulems reviewed    Time  2    Period  Weeks    Status  On-going      PT SHORT TERM GOAL #2   Title  Pt will increase Lt shoulder PROM by 5 deg each plane to improve UE mobility and reaching. 3 weeks 03/15/18    Time  3    Period  Weeks    Status  Achieved        PT Long Term Goals - 04/13/18 0931      PT LONG TERM GOAL #2   Title  Pt will improve Lt shoulder flexion and abd ROM >90 deg for reaching.     Baseline  flexion 70 deg, abd 50 deg    Time  4    Period  Weeks    Status  On-going            Plan - 04/18/18 1243    Clinical Impression Statement  Pt reports compliance with HEP when her skin condition is not limiting. Reviewed previous exercises and began table stretching which she did well with but had increased pain. She declined manual stretching. IFC and hmp at emd of session for pain relief.     PT Next Visit Plan  MHP pre-tx. Continue ROM, manual tx. and progress as tolerated pending pain    PT Home Exercise Plan  isometrics, AAROM, stretching        Patient will benefit from skilled therapeutic intervention in order to improve the following deficits and impairments:  Decreased activity tolerance, Decreased endurance, Decreased range of motion, Decreased strength, Hypomobility, Impaired flexibility, Impaired tone, Postural dysfunction, Pain  Visit Diagnosis: Adhesive capsulitis of left shoulder  History of Erb's palsy     Problem List Patient Active Problem List   Diagnosis Date Noted  . 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 Pregnancy affected by fetal growth restriction 11/28/2016  . Rh negative state in antepartum period 08/02/2016  . Obese 08/02/2016  . Supervision of high risk pregnancy, antepartum 06/01/2016  . History of herpes genitalis 06/01/2016  . Syphilis affecting pregnancy, antepartum 06/01/2016  . Hydradenitis     Sherrie Mustache , [place] 04/18/2018, 1:13 PM  [facility] [address] [place], [place], [address] Phone: [phone]   Fax:  [phone]  Name: Diamond Burton MRN: [id number] Date of Birth: 09/22/1985

## 2018-04-23 ENCOUNTER — Ambulatory Visit: Payer: Medicaid Other | Admitting: Physical Therapy

## 2018-04-23 ENCOUNTER — Encounter: Payer: Self-pay | Admitting: Physical Therapy

## 2018-04-23 DIAGNOSIS — Z87898 Personal history of other specified conditions: Secondary | ICD-10-CM

## 2018-04-23 DIAGNOSIS — Z8669 Personal history of other diseases of the nervous system and sense organs: Secondary | ICD-10-CM | POA: Diagnosis not present

## 2018-04-23 DIAGNOSIS — M7502 Adhesive capsulitis of left shoulder: Secondary | ICD-10-CM

## 2018-04-23 NOTE — Therapy (Signed)
[facility] [address] [place], [place], [address] Phone: [phone]   Fax:  [phone]  Physical Therapy Treatment  Patient Details  Name: Geroldine Neary MRN: [id number] Date of Birth: 1986/04/09 Referring Provider (PT): Willis   Encounter Date: 04/23/2018  PT End of Session - 04/23/18 1032    Visit Number  11    Number of Visits  12    Date for PT Re-Evaluation  04/24/18    Authorization Type  MCD    Authorization Time Period  8 visits 03/30/18-04/26/18    Authorization - Visit Number  7    Authorization - Number of Visits  8    PT Start Time  1015    PT Stop Time  1105    PT Time Calculation (min)  50 min       Past Medical History:  Diagnosis Date  . Abnormal Pap smear   . Depression    while pregnant was on zoloft but now counseling  . Eczema   . Erb's palsy    L arm  . Erb's palsy    left arm  . Herpes    frequent outbreaks on valtrex 500  . History of chlamydia   . 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 History of gonorrhea   . Hydradenitis   . Infection    UTI  . Kidney stones   . Ovarian cyst   . Periodontal disease   . PID (pelvic inflammatory disease)     Past Surgical History:  Procedure Laterality Date  . NO PAST SURGERIES      There were no vitals filed for this visit.  Subjective Assessment - 04/23/18 1030    Subjective  Having alot of pain from my skin condition under my right arm today. left arm is stil  10/10 pain and tight and sore.     Currently in Pain?  Yes    Pain Score  10-Worst pain ever    Pain Location  Shoulder    Pain Orientation  Left    Pain Descriptors / Indicators  Sore                       [facility] Adult PT Treatment/Exercise - 04/23/18 0001      Shoulder Exercises: Supine   Other Supine Exercises  --      Shoulder Exercises: Standing   Retraction  20 reps;Theraband    Theraband Level (Shoulder Retraction)  Level 1 (Yellow)    Other  Standing Exercises  standing cane flexion, abduction and IR using UE ranger    Other Standing Exercises  UE ranger standing flexion, horizontals and ER/IR       Shoulder Exercises: Pulleys   Flexion  2 minutes    Flexion Limitations  cues to decrease lean over.       Shoulder Exercises: Stretch   Table Stretch - Flexion  10 seconds;5 reps    Table Stretch - Abduction  10 seconds;3 reps    Table Stretch - External Rotation  10 seconds;5 reps      Moist Heat Therapy   Number Minutes Moist Heat  15 Minutes    Moist Heat Location  Shoulder      Electrical Stimulation   Electrical Stimulation Location  shoulder    Electrical Stimulation Action  IFC    Electrical Stimulation Parameters  [id number]8mss478ArizonHa41mss478ArizonHassan Rowan Custardtimulation Goals  Tone;Pain  PT Education - 04/23/18 1041    Education Details  HEP    Person(s) Educated  Patient    Methods  Explanation;Handout    Comprehension  Verbalized understanding       PT Short Term Goals - 04/02/18 1116      PT SHORT TERM GOAL #1   Title  Pt will be I and compliant with HEP    Baseline  stretches, pendulems reviewed    Time  2    Period  Weeks    Status  On-going      PT SHORT TERM GOAL #2   Title  Pt will increase Lt shoulder PROM by 5 deg each plane to improve UE mobility and reaching. 3 weeks 03/15/18    Time  3    Period  Weeks    Status  Achieved        PT Long Term Goals - 04/13/18 0931      PT LONG TERM GOAL #2   Title  Pt will improve Lt shoulder flexion and abd ROM >90 deg for reaching.     Baseline  flexion 70 deg, abd 50 deg    Time  4    Period  Weeks    Status  On-going            Plan - 04/23/18 1033    Clinical Impression Statement  Pt reports performing some HEP table slides however after the right arm skin condition flared up she was unable to continue. Reviewed Table slides and added to HEP. IFC performed for pain relief at end of session.     PT Next Visit Plan  MHP pre-tx. Continue ROM,  manual tx. and progress as tolerated pending pain    PT Home Exercise Plan  isometrics, AAROM, stretching       Patient will benefit from skilled therapeutic intervention in order to improve the following deficits and impairments:  Decreased activity tolerance, Decreased endurance, Decreased range of motion, Decreased strength, Hypomobility, Impaired flexibility, Impaired tone, Postural dysfunction, Pain  Visit Diagnosis: Adhesive capsulitis of left shoulder  History of Erb's palsy     Problem List Patient Active Problem List   Diagnosis Date Noted  . 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 Pregnancy affected by fetal growth restriction 11/28/2016  . Rh negative state in antepartum period 08/02/2016  . Obese 08/02/2016  . Supervision of high risk pregnancy, antepartum 06/01/2016  . History of herpes genitalis 06/01/2016  . Syphilis affecting pregnancy, antepartum 06/01/2016  . Hydradenitis     Sherrie Mustache, [place] 04/23/2018, 10:59 AM  [facility] [address] [place], [place], [address] Phone: [phone]   Fax:  [phone]  Name: Ariam Mol MRN: [id number] Date of Birth: [birth year]

## 2018-04-23 NOTE — Patient Instructions (Signed)
   Copyright  VHI. All rights reserved.  Flexion (Passive)    Sitting upright, slide forearm forward along table, bending from the waist until a stretch is felt. Hold _20-30___ seconds. Repeat __5__ times. Do __3__ sessions per day.  Abduction (Passive)    With arm out to side, resting on table, lower head toward arm, keeping trunk away from table. Hold __20-30__ seconds. Repeat ___5_ times. Do _3___ sessions per day.   Copyright  VHI. All rights reserved.  Table Stretch: External Rotation    Sit with left arm on table. Lean forward until a stretch is felt in shoulder. Hold __20-30__ seconds. Repeat __5__ times. Do __3__ sessions per day.  http://[email or web address].exer.us/106   Copyright  VHI. All rights reserved.

## 2018-04-26 ENCOUNTER — Ambulatory Visit: Payer: Medicaid Other

## 2018-04-26 DIAGNOSIS — M7502 Adhesive capsulitis of left shoulder: Secondary | ICD-10-CM

## 2018-04-26 DIAGNOSIS — Z8669 Personal history of other diseases of the nervous system and sense organs: Secondary | ICD-10-CM | POA: Diagnosis not present

## 2018-04-26 DIAGNOSIS — Z87898 Personal history of other specified conditions: Secondary | ICD-10-CM

## 2018-04-26 NOTE — Therapy (Signed)
[place], [place], [address] Phone: [phone]   Fax:  [phone]  Physical Therapy Treatment/Discharge  Patient Details  Name: Diamond Burton MRN: [id number] Date of Birth: [birth year] Referring Provider (PT): Jannifer Franklin   Encounter Date: 04/26/2018  PT End of Session - 04/26/18 1002    Visit Number  12    Number of Visits  12    Date for PT Re-Evaluation  04/24/18    Authorization Type  MCD    Authorization Time Period  8 visits 03/30/18-04/26/18    Authorization - Visit Number  8    Authorization - Number of Visits  8    PT Start Time  1000    PT Stop Time  1015    PT Time Calculation (min)  15 min    Activity Tolerance  Patient limited by pain    Behavior During Therapy  [facility] for tasks assessed/performed       Past Medical History:  Diagnosis Date  . Abnormal Pap smear   . Depression    while pregnant was on zoloft but now counseling  . Eczema   . Erb's palsy    L arm  . Erb's palsy    left arm  . Herpes    frequent outbreaks on valtrex 500  . History of chlamydia   . 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 History of gonorrhea   . Hydradenitis   . Infection    UTI  . Kidney stones   . Ovarian cyst   . Periodontal disease   . PID (pelvic inflammatory disease)     Past Surgical History:  Procedure Laterality Date  . NO PAST SURGERIES      There were no vitals filed for this visit.  Subjective Assessment - 04/26/18 1003    Subjective  LT arm is same as initially.      Pain Score  9     Pain Location  Shoulder    Pain Orientation  Left    Pain Descriptors / Indicators  Sore    Pain Type  Chronic pain    Pain Onset  More than a month ago    Pain Frequency  Constant    Aggravating Factors   Holding child, gripping, picking up items    Pain Relieving Factors  heat         [facility] PT Assessment - 04/26/18 0001      Assessment   Medical Diagnosis  Adhesive capsulitis  of left shoulder, Erb's palsey of Lt arm    Referring Provider (PT)  Jannifer Franklin      Cognition   Overall Cognitive Status  Within Functional Limits for tasks assessed      AROM   Left Shoulder Flexion  65 Degrees    Left Shoulder ABduction  50 Degrees    Left Shoulder Internal Rotation  --   hand to lateral LT hip   Left Shoulder External Rotation  0 Degrees                   [facility] Adult PT Treatment/Exercise - 04/26/18 0001      Self-Care   Self-Care  Other Self-Care Comments    Other Self-Care Comments   Reviewed exercise and need to continue exer indefinately and to push self on better days and ease off on days that are more painful.  PT Education - 04/26/18 1018    Education Details  Need to continue HEP indefinately to max use or Lt arm.     Person(s) Educated  Patient    Methods  Explanation    Comprehension  Verbalized understanding       PT Short Term Goals - 04/26/18 1005      PT SHORT TERM GOAL #1   Title  Pt will be I and compliant with HEP    Baseline  reports doing HEp    Status  Achieved      PT SHORT TERM GOAL #2   Title  Pt will increase Lt shoulder PROM by 5 deg each plane to improve UE mobility and reaching. 3 weeks 03/15/18    Baseline  no improvements     Status  Not Met        PT Long Term Goals - 04/26/18 1005      PT LONG TERM GOAL #1   Title  Pt will be I and compliant with final advanced HEP.    Baseline  per her report    Status  Achieved      PT LONG TERM GOAL #2   Title  Pt will improve Lt shoulder flexion and abd ROM >90 deg for reaching.     Baseline  flexion 65 deg, abd 50 deg    Status  Not Met      PT LONG TERM GOAL #3   Title  Pt will report overall pain less than 3-4/10 with usual activity.    Baseline  pain no better than 8/10    Status  Not Met      PT LONG TERM GOAL #4   Title  Left shoulder IR reach to at least sacrum, ER AROM increased at least 10-20 deg to improve ability to dress, bathe  with left UE use.    Baseline  IR to hip crest, ER 0 deg AROM today    Status  Not Met            Plan - 04/26/18 1002    Clinical Impression Statement  No improvement in pain or ROM.  Agreed to discharge. Encouraged her to continue HEP indefinately    Clinical Impairments Affecting Rehab Potential  underlying Erb's Palsy, comorbidities/skin issues with hydradenitis    PT Treatment/Interventions  Cryotherapy;Electrical Stimulation;Iontophoresis '4mg'$ /ml Dexamethasone;Moist Heat;Ultrasound;Therapeutic exercise;Neuromuscular re-education;Manual techniques;Passive range of motion;Dry needling;Taping;Vasopneumatic Device;Joint Manipulations    PT Next Visit Plan  Discharge today with HEP    PT Home Exercise Plan  isometrics, AAROM, stretching    Consulted and Agree with Plan of Care  Patient       Patient will benefit from skilled therapeutic intervention in order to improve the following deficits and impairments:  Decreased activity tolerance, Decreased endurance, Decreased range of motion, Decreased strength, Hypomobility, Impaired flexibility, Impaired tone, Postural dysfunction, Pain  Visit Diagnosis: Adhesive capsulitis of left shoulder  History of Erb's palsy     Problem List Patient Active Problem List   Diagnosis Date Noted  . 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 Pregnancy affected by fetal growth restriction 11/28/2016  . Rh negative state in antepartum period 08/02/2016  . Obese 08/02/2016  . Supervision of high risk pregnancy, antepartum 06/01/2016  . History of herpes genitalis 06/01/2016  . Syphilis affecting pregnancy, antepartum 06/01/2016  . Hydradenitis     Darrel Hoover  PT 04/26/2018, 10:21 AM  [place],  [place], [address] Phone: [phone]   Fax:  [phone]  Name: Diamond Burton MRN: [id number] Date of Birth: October 04, 1985

## 2018-05-02 ENCOUNTER — Other Ambulatory Visit: Payer: Self-pay | Admitting: Obstetrics

## 2018-05-02 DIAGNOSIS — A6009 Herpesviral infection of other urogenital tract: Secondary | ICD-10-CM

## 2018-05-02 DIAGNOSIS — O98313 Other infections with a predominantly sexual mode of transmission complicating pregnancy, third trimester: Principal | ICD-10-CM

## 2018-06-13 NOTE — L&D Delivery Note (Signed)
Delivery Note At 7:35 AM, on April 29, 2019, a viable female "Diamond Burton" was delivered via Vaginal, Spontaneous (Presentation: Left Occiput Anterior with restitution to ROT ).   After delivery of head, nuchal cord noted that shoulders and body was delivered through via somersault maneuver.  Infant with flaccid tone, minimal respiratory efforts, but good grimace. Tactile stimulation given by provider and infant placed on mother's abdomen where nurse continued tactile stimulation and gave bulb suction.  After ~03JKK delay, the umbilical cord was clamped, cut by provider, and blood collected.  Infant taken to warmer where nurses continued tactile stimulation. Infant APGAR: 5, 9.  Placenta delivered spontaneously via [name] and was noted to be intact with 3VC upon inspection.  Vaginal inspection revealed bilateral labial laceration that was repaired with 3-0 vicryl on SH.  No additional anesthetic necessary and patient tolerated the procedure well. Fundus firm, at the umbilicus, and bleeding small.  Mother hemodynamically stable and infant skin to skin prior to provider exit.  Mother desires Nexplanon or pills for birth control and opts to bottletfeed.  Infant weight at one hour of life: 8lbs 0.4 oz, 20.5 in  Anesthesia:  Epidural Episiotomy: None Lacerations: Labial Suture Repair: 3.0 vicryl Est. Blood Loss (mL): 75   Mom to postpartum.  Baby to Couplet care / Skin to Skin.  [name] MSN, CNM Advanced Practice Provider, [facility] for [place] 04/29/2019, 8:17 AM

## 2018-06-27 ENCOUNTER — Ambulatory Visit: Payer: Medicaid Other | Admitting: Obstetrics

## 2018-06-29 ENCOUNTER — Encounter: Payer: Self-pay | Admitting: Advanced Practice Midwife

## 2018-06-29 ENCOUNTER — Ambulatory Visit: Payer: Medicaid Other | Admitting: Advanced Practice Midwife

## 2018-06-29 ENCOUNTER — Other Ambulatory Visit (HOSPITAL_COMMUNITY)
Admission: RE | Admit: 2018-06-29 | Discharge: 2018-06-29 | Disposition: A | Discharge status: Home / Self Care | Payer: Medicaid Other | Source: Ambulatory Visit | Attending: Obstetrics | Admitting: Obstetrics

## 2018-06-29 VITALS — BP 114/76 | HR 62 | Ht 65.0 in | Wt 236.7 lb

## 2018-06-29 DIAGNOSIS — Z202 Contact with and (suspected) exposure to infections with a predominantly sexual mode of transmission: Secondary | ICD-10-CM | POA: Insufficient documentation

## 2018-06-29 NOTE — Progress Notes (Signed)
Patient is in the office for std testing. Pt reports vaginal itching, irritation, and vaginal swelling.

## 2018-06-29 NOTE — Patient Instructions (Signed)

## 2018-06-29 NOTE — Progress Notes (Signed)
GYNECOLOGY OFFICE VISIT ENCOUNTER NOTE  Subjective:   Diamond Burton is a 33 y.o. G30P3003 female here for a STI screening.  Current complaints: two week history of vaginal itching and discharge. Patient states she has recently been on multiple antibiotics and thinks that is the source of her vaginal discomfort. Patient is sexually active with multiple partners and is unsure of their testing status. Denies abnormal vaginal bleeding, discharge, pelvic pain, problems with intercourse or other gynecologic concerns.    Gynecologic History No LMP recorded. Contraception: OCP (estrogen/progesterone) Last Pap: 12/19/17. Results were: normal with negative HPV Last mammogram: N/A age 79.   Obstetric History OB History  Gravida Para Term Preterm AB Living  3 3 3     3   SAB TAB Ectopic Multiple Live Births        0 3    # Outcome Date GA Lbr Len/2nd Weight Sex Delivery Anes PTL Lv  3 Term 11/29/16 [redacted]w[redacted]d 09:26 / 00:25 5 lb 11 oz (2.58 kg) M Vag-Vacuum EPI  LIV  2 Term 06/28/11 [redacted]w[redacted]d 12:32 / 00:21 7 lb 1.1 oz (3.205 kg) F Vag-Spont EPI  LIV     Birth Comments: WNL  1 Term 2005 [redacted]w[redacted]d  6 lb 8 oz (2.948 kg) F    LIV    Past Medical History:  Diagnosis Date  . Abnormal Pap smear   . Depression    while pregnant was on zoloft but now counseling  . Eczema   . Erb's palsy    L arm  . Erb's palsy    left arm  . Herpes    frequent outbreaks on valtrex 500  . History of chlamydia   . 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 History of gonorrhea   . Hydradenitis   . Infection    UTI  . Kidney stones   . Ovarian cyst   . Periodontal disease   . PID (pelvic inflammatory disease)     Past Surgical History:  Procedure Laterality Date  . NO PAST SURGERIES      Current Outpatient Medications on File Prior to Visit  Medication Sig Dispense Refill  . Benzoyl Peroxide (BENZOYL PEROXIDE) 10 % LIQD Apply topically.    . benztropine (COGENTIN) 0.5 MG tablet Take 0.5 mg by mouth 2 (two)  times daily.  0  . clindamycin (CLEOCIN T) 1 % external solution APPLY TO UNDERARMS,GROIN AND BUTTOCK TWICE A DAY  6  . [facility] MELATONIN 3 MG TABS TAKE 1 TO 2 TABLET AT BEDTIME, AS NEEDED  0  . doxycycline (VIBRA-TABS) 100 MG tablet Take 100 mg by mouth 2 (two) times daily.  1  . fluconazole (DIFLUCAN) 150 MG tablet Take 150 mg by mouth daily.    [name] ibuprofen (ADVIL,MOTRIN) 800 MG tablet Take 1 tablet (800 mg total) by mouth every 8 (eight) hours as needed. 30 tablet 5  . PARoxetine (PAXIL) 10 MG tablet Take 10 mg by mouth daily.    . risperiDONE (RISPERDAL) 1 MG tablet Take 1 mg by mouth at bedtime.    . traZODone (DESYREL) 50 MG tablet Take 50 mg by mouth at bedtime.    . valACYclovir (VALTREX) 1000 MG tablet Take 1,000 mg by mouth daily.     . valACYclovir (VALTREX) 1000 MG tablet Take 1 tablet (1,000 mg total) by mouth daily. 30 tablet 11   No current facility-administered medications on file prior to visit.     No Known Allergies  Social  History:  reports that she has quit smoking. Her smoking use included cigarettes. She has a 1.00 pack-year smoking history. She has never used smokeless tobacco. She reports that she does not drink alcohol or use drugs.  Family History  Problem Relation Age of Onset  . Depression Mother   . Diabetes Mother   . Hypertension Mother   . Heart disease Mother   . Asthma Sister   . Asthma Daughter   . Hypertension Maternal Grandmother   . Diabetes Maternal Grandmother     The following portions of the patient's history were reviewed and updated as appropriate: allergies, current medications, past family history, past medical history, past social history, past surgical history and problem list.  Review of Systems Pertinent items noted in HPI and remainder of comprehensive ROS otherwise negative.   Objective:  BP 114/76   Pulse 62   Ht 5\' 5"  (1.651 m)   Wt 236 lb 11.2 oz (107.4 kg)   BMI 39.39 kg/m  CONSTITUTIONAL: Well-developed, well-nourished  female in no acute distress.  HENT:  Normocephalic, atraumatic, External right and left ear normal. Oropharynx is clear and moist EYES: Conjunctivae and EOM are normal. Pupils are equal, round, and reactive to light. No scleral icterus.  NECK: Normal range of motion, supple, no masses.  Normal thyroid.  SKIN: Skin is warm and dry. No rash noted. Not diaphoretic. No erythema. No pallor. MUSCULOSKELETAL: Normal range of motion. No tenderness.  No cyanosis, clubbing, or edema.  2+ distal pulses. NEUROLOGIC: Alert and oriented to person, place, and time. Normal reflexes, muscle tone coordination. No cranial nerve deficit noted. PSYCHIATRIC: Normal mood and affect. Normal behavior. Normal judgment and thought content. CARDIOVASCULAR: Normal heart rate noted, regular rhythm RESPIRATORY: Clear to auscultation bilaterally. Effort and breath sounds normal, no problems with respiration noted. BREASTS: Symmetric in size. No masses, skin changes, nipple drainage, or lymphadenopathy. ABDOMEN: Soft, normal bowel sounds, no distention noted.  No tenderness, rebound or guarding.  PELVIC: Excoriations from patient scratching visible on external genitalia; normal appearing vaginal mucosa, cervix appears reddened and slightly irritated.  Thick white collections of  discharge noted.  Normal uterine size, no other palpable masses, no uterine or adnexal tenderness.  Assessment and Plan:  1. Sexually transmitted disease exposure - Cervicovaginal ancillary only( [place]) - HIV antibody (with reflex) - RPR - Hepatitis B Surface AntiGEN - Hepatitis C Antibody  Will follow up results of labs and manage accordingly Routine preventative health maintenance measures emphasized. Please refer to After Visit Summary for other counseling recommendations.   Clayton BiblesSamantha Weinhold,  [facility], [facility]

## 2018-06-30 LAB — HEPATITIS C ANTIBODY: Hep C Virus Ab: <0.1 s/co ratio (ref 0.0–0.9)

## 2018-06-30 LAB — HIV ANTIBODY (ROUTINE TESTING W REFLEX): HIV SCREEN 4TH GENERATION: NONREACTIVE

## 2018-06-30 LAB — RPR: RPR Ser Ql: NONREACTIVE

## 2018-06-30 LAB — HEPATITIS B SURFACE ANTIGEN: Hepatitis B Surface Ag: NEGATIVE

## 2018-07-02 LAB — CERVICOVAGINAL ANCILLARY ONLY
Bacterial vaginitis: NEGATIVE
CANDIDA VAGINITIS: POSITIVE — AB
CHLAMYDIA, DNA PROBE: NEGATIVE
Neisseria Gonorrhea: NEGATIVE
Trichomonas: POSITIVE — AB

## 2018-07-04 ENCOUNTER — Telehealth: Payer: Self-pay

## 2018-07-04 MED ORDER — METRONIDAZOLE 500 MG PO TABS: ORAL_TABLET | ORAL | 0 refills | Status: DC

## 2018-07-04 MED ORDER — FLUCONAZOLE 150 MG PO TABS: 150.0000 mg | ORAL_TABLET | Freq: Once | ORAL | 0 refills | Status: AC

## 2018-07-04 NOTE — Telephone Encounter (Signed)
Returned call, advised of results, rx and treatment plan.

## 2018-07-04 NOTE — Telephone Encounter (Signed)
Attempted to return call on [phone] as pt requested, called twice, no answer, unable to leave vm.

## 2018-07-04 NOTE — Telephone Encounter (Signed)
Attempted to contact about results, rx sent, no answer, left vm.

## 2018-07-15 ENCOUNTER — Inpatient Hospital Stay (HOSPITAL_COMMUNITY)
Admission: AD | Admit: 2018-07-15 | Discharge: 2018-07-15 | Disposition: A | Discharge status: Home / Self Care | Payer: Medicaid Other | Attending: Obstetrics & Gynecology | Admitting: Obstetrics & Gynecology

## 2018-07-15 ENCOUNTER — Encounter (HOSPITAL_COMMUNITY): Payer: Self-pay

## 2018-07-15 ENCOUNTER — Inpatient Hospital Stay (HOSPITAL_COMMUNITY): Payer: Medicaid Other

## 2018-07-15 DIAGNOSIS — Z3A01 Less than 8 weeks gestation of pregnancy: Secondary | ICD-10-CM | POA: Diagnosis not present

## 2018-07-15 DIAGNOSIS — Z87891 Personal history of nicotine dependence: Secondary | ICD-10-CM | POA: Insufficient documentation

## 2018-07-15 DIAGNOSIS — O26891 Other specified pregnancy related conditions, first trimester: Secondary | ICD-10-CM

## 2018-07-15 DIAGNOSIS — R109 Unspecified abdominal pain: Secondary | ICD-10-CM | POA: Insufficient documentation

## 2018-07-15 DIAGNOSIS — O26899 Other specified pregnancy related conditions, unspecified trimester: Secondary | ICD-10-CM

## 2018-07-15 LAB — CBC
HCT: 41 % (ref 36.0–46.0)
Hemoglobin: 13.6 g/dL (ref 12.0–15.0)
MCH: 29.1 pg (ref 26.0–34.0)
MCHC: 33.2 g/dL (ref 30.0–36.0)
MCV: 87.6 fL (ref 80.0–100.0)
Platelets: 267 10*3/uL (ref 150–400)
RBC: 4.68 MIL/uL (ref 3.87–5.11)
RDW: 14.2 % (ref 11.5–15.5)
WBC: 8.9 10*3/uL (ref 4.0–10.5)
nRBC: 0 % (ref 0.0–0.2)

## 2018-07-15 LAB — HCG, QUANTITATIVE, PREGNANCY: hCG, Beta Chain, Quant, S: 764 m[IU]/mL — ABNORMAL HIGH (ref <5)

## 2018-07-15 LAB — WET PREP, GENITAL
Clue Cells Wet Prep HPF POC: NONE SEEN
Sperm: NONE SEEN
Trich, Wet Prep: NONE SEEN
Yeast Wet Prep HPF POC: NONE SEEN

## 2018-07-15 LAB — URINALYSIS, ROUTINE W REFLEX MICROSCOPIC
Bilirubin Urine: NEGATIVE
GLUCOSE, UA: NEGATIVE mg/dL
Hgb urine dipstick: NEGATIVE
Ketones, ur: NEGATIVE mg/dL
Leukocytes, UA: NEGATIVE
Nitrite: NEGATIVE
Protein, ur: NEGATIVE mg/dL
Specific Gravity, Urine: >1.03 — ABNORMAL HIGH (ref 1.005–1.030)
pH: 5 (ref 5.0–8.0)

## 2018-07-15 LAB — POCT PREGNANCY, URINE: Preg Test, Ur: POSITIVE — AB

## 2018-07-15 NOTE — Discharge Instructions (Signed)
Your ultrasound showed what appears to be a pregnancy in the uterus; however, it is too early in your pregnancy to see all of the components of fetal development. Therefore, we need to obtain repeat blood work on Wednesday to see the trend to determine if baby is growing correctly. Have this lab visit done in the clinic downstairs at [facility] hospital. You have an appointment scheduled for 10 am. You will likely need a repeat ultrasound in two weeks.

## 2018-07-15 NOTE — MAU Provider Note (Signed)
History     CSN: [id number]  Arrival date and time: 07/15/18 1101   None     Chief Complaint  Patient presents with  . Abdominal Pain  . Back Pain  . Possible Pregnancy   HPI  Diamond Burton is 33 y.o. [phone] female at [redacted]w[redacted]d by LMP (approximate) who presents for abdominal cramping that started yesterday. She had a +HPT at home this morning. Denies vaginal bleeding and discharge.   OB History    Gravida  4   Para  3   Term  3   Preterm      AB      Living  3     SAB      TAB      Ectopic      Multiple  0   Live Births  3           Past Medical History:  Diagnosis Date  . Abnormal Pap smear   . Depression    while pregnant was on zoloft but now counseling  . Eczema   . Erb's palsy    L arm  . Erb's palsy    left arm  . Herpes    frequent outbreaks on valtrex 500  . History of chlamydia   . 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 History of gonorrhea   . Hydradenitis   . Infection    UTI  . Kidney stones   . Ovarian cyst   . Periodontal disease   . PID (pelvic inflammatory disease)     Past Surgical History:  Procedure Laterality Date  . NO PAST SURGERIES      Family History  Problem Relation Age of Onset  . Depression Mother   . Diabetes Mother   . Hypertension Mother   . Heart disease Mother   . Asthma Sister   . Asthma Daughter   . Hypertension Maternal Grandmother   . Diabetes Maternal Grandmother     Social History   Tobacco Use  . Smoking status: Former Smoker    Packs/day: 0.50    Years: 2.00    Pack years: 1.00    Types: Cigarettes  . Smokeless tobacco: Never Used  Substance Use Topics  . Alcohol use: No    Comment: occ  . Drug use: No    Allergies: No Known Allergies  Medications Prior to Admission  Medication Sig Dispense Refill Last Dose  . Benzoyl Peroxide (BENZOYL PEROXIDE) 10 % LIQD Apply topically.   Taking  . benztropine (COGENTIN) 0.5 MG tablet Take 0.5 mg by mouth 2 (two) times daily.  0  Taking  . clindamycin (CLEOCIN T) 1 % external solution APPLY TO UNDERARMS,GROIN AND BUTTOCK TWICE A DAY  6 Taking  . [facility] MELATONIN 3 MG TABS TAKE 1 TO 2 TABLET AT BEDTIME, AS NEEDED  0 Taking  . doxycycline (VIBRA-TABS) 100 MG tablet Take 100 mg by mouth 2 (two) times daily.  1 Taking  . fluconazole (DIFLUCAN) 150 MG tablet Take 150 mg by mouth daily.   Taking  . ibuprofen (ADVIL,MOTRIN) 800 MG tablet Take 1 tablet (800 mg total) by mouth every 8 (eight) hours as needed. 30 tablet 5 Taking  . metroNIDAZOLE (FLAGYL) 500 MG tablet Take 4 tablets by mouth once. 4 tablet 0   . PARoxetine (PAXIL) 10 MG tablet Take 10 mg by mouth daily.   Taking  . risperiDONE (RISPERDAL) 1 MG tablet Take 1 mg by mouth at bedtime.  Taking  . traZODone (DESYREL) 50 MG tablet Take 50 mg by mouth at bedtime.   Taking  . valACYclovir (VALTREX) 1000 MG tablet Take 1,000 mg by mouth daily.    Taking  . valACYclovir (VALTREX) 1000 MG tablet Take 1 tablet (1,000 mg total) by mouth daily. 30 tablet 11 Taking    Review of Systems  Constitutional: Negative for chills and fever.  Respiratory: Negative for shortness of breath.   Cardiovascular: Negative for chest pain.  Gastrointestinal: Positive for abdominal pain. Negative for nausea and vomiting.  Genitourinary: Negative for vaginal bleeding, vaginal discharge and vaginal pain.  Neurological: Negative for dizziness and light-headedness.   Physical Exam   Blood pressure 114/65, pulse 73, temperature 98.1 F (36.7 C), temperature source Oral, resp. rate 18, height 5\' 5"  (1.651 m), weight 109.1 kg, last menstrual period 05/31/2018, not currently breastfeeding.  Physical Exam  Constitutional: She is oriented to person, place, and time. She appears well-developed and well-nourished. No distress.  HENT:  Head: Normocephalic and atraumatic.  Eyes: Conjunctivae and EOM are normal.  Neck: Normal range of motion. Neck supple.  Cardiovascular: Normal rate and regular rhythm.   Respiratory: Effort normal and breath sounds normal. No respiratory distress.  GI: Soft. She exhibits no distension. There is no abdominal tenderness. There is no rebound and no guarding.  Musculoskeletal: Normal range of motion.        General: No edema.  Neurological: She is alert and oriented to person, place, and time.  Skin: Skin is warm and dry. She is not diaphoretic.  Psychiatric: She has a normal mood and affect. Her behavior is normal.    MAU Course  Procedures  MDM Upreg positive. Hcg obtained and found to be 764. Cultures and wet prep obtained, wet prep unremarkable.   [place] Ob Comp Less 14 Wks  Result Date: 07/15/2018 CLINICAL DATA:  Abdominal pain, first trimester of pregnancy EXAM: OBSTETRIC <14 WK US AND TRANSVAGINAL OB US TECHNIQUE: Both transabdominal and transvaginal ultrasound examinations were performed for complete evaluation of the gestation as well as the maternal uterus, adnexal regions, and pelvic cul-de-sac. Transvaginal technique was performed to assess early pregnancy. COMPARISON:  None. FINDINGS: Intrauterine gestational sac: Single visualized. Yolk sac:  Not visualized. Embryo:  Not visualized. Cardiac Activity: Not visualized. MSD: 5 mm   5 w   2 d Subchorionic hemorrhage:  None visualized. Maternal uterus/adnexae: Ovaries appear normal. Small amount of free fluid is noted which most likely is physiologic. IMPRESSION: Probable early intrauterine gestational sac, but no yolk sac, fetal pole, or cardiac activity yet visualized. Recommend follow-up quantitative B-HCG levels and follow-up US in 14 days to assess viability. This recommendation follows SRU consensus guidelines: Diagnostic Criteria for Nonviable Pregnancy Early in the First Trimester. Malva Limes Engl J Med 2013; 161:0960-45; 369:1443-51. Electronically Signed   By: Lupita RaiderJames  Green Jr, M.D.   On: 07/15/2018 14:40   [place] Ob Transvaginal  Result Date: 07/15/2018 CLINICAL DATA:  Abdominal pain, first trimester of pregnancy EXAM:  OBSTETRIC <14 WK US AND TRANSVAGINAL OB US TECHNIQUE: Both transabdominal and transvaginal ultrasound examinations were performed for complete evaluation of the gestation as well as the maternal uterus, adnexal regions, and pelvic cul-de-sac. Transvaginal technique was performed to assess early pregnancy. COMPARISON:  None. FINDINGS: Intrauterine gestational sac: Single visualized. Yolk sac:  Not visualized. Embryo:  Not visualized. Cardiac Activity: Not visualized. MSD: 5 mm   5 w   2 d Subchorionic hemorrhage:  None visualized. Maternal uterus/adnexae: Ovaries appear normal. Small amount  of free fluid is noted which most likely is physiologic. IMPRESSION: Probable early intrauterine gestational sac, but no yolk sac, fetal pole, or cardiac activity yet visualized. Recommend follow-up quantitative B-HCG levels and follow-up US in 14 days to assess viability. This recommendation follows SRU consensus guidelines: Diagnostic Criteria for Nonviable Pregnancy Early in the First Trimester. Malva Limes Med 2013; 099:8338-25. Electronically Signed   By: Lupita Raider, M.D.   On: 07/15/2018 14:40   Assessment and Plan   1. Abdominal pain affecting pregnancy   2. Abdominal pain during pregnancy in first trimester   3. [redacted] weeks gestation of pregnancy    Discussed with patient that ultrasound shows probable early IUP. However, with lack of yolk sac and cardiac activity unable to determine viability. Will repeat B-HCG in 48 hours. Will likely need repeat sono in 14 days to assess viability. Cultures pending. Strict return precautions reviewed.   De Hollingshead 07/15/2018, 3:19 PM

## 2018-07-15 NOTE — MAU Note (Signed)
Diamond Burton is a 33 y.o. here in MAU reporting: Started having abdominal cramping yesterday and into her back. No bleeding, no vaginal discharge. + UPT at home today  LMP: 05/31/18 approximately  Onset of complaint: yesterday  Pain score: abdominal pain is 10/10, back pain is 10/10  Vitals:   07/15/18 1142  BP: 114/65  Pulse: 73  Resp: 18  Temp: 98.1 F (36.7 C)      Lab orders placed from triage: UA, UPT

## 2018-07-16 LAB — GC/CHLAMYDIA PROBE AMP (~~LOC~~) NOT AT ARMC
Chlamydia: NEGATIVE
Neisseria Gonorrhea: NEGATIVE

## 2018-07-16 LAB — HIV ANTIBODY (ROUTINE TESTING W REFLEX): HIV Screen 4th Generation wRfx: NONREACTIVE

## 2018-07-17 ENCOUNTER — Encounter (HOSPITAL_COMMUNITY): Payer: Self-pay | Admitting: *Deleted

## 2018-07-17 ENCOUNTER — Inpatient Hospital Stay (HOSPITAL_COMMUNITY)
Admission: AD | Admit: 2018-07-17 | Discharge: 2018-07-17 | Disposition: A | Discharge status: Home / Self Care | Payer: Medicaid Other | Attending: Obstetrics and Gynecology | Admitting: Obstetrics and Gynecology

## 2018-07-17 ENCOUNTER — Other Ambulatory Visit: Payer: Self-pay

## 2018-07-17 ENCOUNTER — Inpatient Hospital Stay (HOSPITAL_COMMUNITY): Payer: Medicaid Other

## 2018-07-17 DIAGNOSIS — O209 Hemorrhage in early pregnancy, unspecified: Secondary | ICD-10-CM | POA: Insufficient documentation

## 2018-07-17 DIAGNOSIS — O3680X0 Pregnancy with inconclusive fetal viability, not applicable or unspecified: Secondary | ICD-10-CM

## 2018-07-17 DIAGNOSIS — O4690 Antepartum hemorrhage, unspecified, unspecified trimester: Secondary | ICD-10-CM

## 2018-07-17 DIAGNOSIS — Z6791 Unspecified blood type, Rh negative: Secondary | ICD-10-CM | POA: Diagnosis not present

## 2018-07-17 DIAGNOSIS — Z3A01 Less than 8 weeks gestation of pregnancy: Secondary | ICD-10-CM | POA: Diagnosis not present

## 2018-07-17 DIAGNOSIS — O26891 Other specified pregnancy related conditions, first trimester: Secondary | ICD-10-CM | POA: Insufficient documentation

## 2018-07-17 DIAGNOSIS — O26899 Other specified pregnancy related conditions, unspecified trimester: Secondary | ICD-10-CM

## 2018-07-17 DIAGNOSIS — O4691 Antepartum hemorrhage, unspecified, first trimester: Secondary | ICD-10-CM

## 2018-07-17 DIAGNOSIS — Z87891 Personal history of nicotine dependence: Secondary | ICD-10-CM | POA: Diagnosis not present

## 2018-07-17 HISTORY — DX: Contact with and (suspected) exposure to infections with a predominantly sexual mode of transmission: Z20.2

## 2018-07-17 LAB — CBC
HCT: 41 % (ref 36.0–46.0)
Hemoglobin: 13.8 g/dL (ref 12.0–15.0)
MCH: 29.2 pg (ref 26.0–34.0)
MCHC: 33.7 g/dL (ref 30.0–36.0)
MCV: 86.7 fL (ref 80.0–100.0)
Platelets: 294 10*3/uL (ref 150–400)
RBC: 4.73 MIL/uL (ref 3.87–5.11)
RDW: 14.3 % (ref 11.5–15.5)
WBC: 10.6 10*3/uL — ABNORMAL HIGH (ref 4.0–10.5)
nRBC: 0 % (ref 0.0–0.2)

## 2018-07-17 LAB — HCG, QUANTITATIVE, PREGNANCY: hCG, Beta Chain, Quant, S: 612 m[IU]/mL — ABNORMAL HIGH (ref <5)

## 2018-07-17 MED ORDER — RHO D IMMUNE GLOBULIN 1500 UNIT/2ML IJ SOSY
300.0000 ug | PREFILLED_SYRINGE | Freq: Once | INTRAMUSCULAR | Status: AC
  Administered 2018-07-17: 300 ug via INTRAMUSCULAR
  Filled 2018-07-17: qty 2

## 2018-07-17 NOTE — MAU Note (Signed)
Urine is very bloody and is in the lab

## 2018-07-17 NOTE — MAU Note (Addendum)
Pt here today for bleeding- started yesterday-has gotten heavier, that started this morning. Started cramping yesterday. Was scheduled to return for repeat BHCG today; pt unaware of this, again states here because she is bleeding.  When she used the toilet here (quarter sized), something "fell out". (she flushed it).

## 2018-07-17 NOTE — MAU Provider Note (Signed)
History     CSN: [id number]  Arrival date and time: 07/17/18 1116   First Provider Initiated Contact with Patient 07/17/18 1201      Chief Complaint  Patient presents with  . Vaginal Bleeding   [id number] @[redacted]w[redacted]d  by LMP presenting with VB. Bleeding started yesterday. Amount is as much as a period. Passed a quarter sized clot. Denies pain or cramping. Was seen in MAU 2 days ago and US showed possible IUGS but no YS or FP.  OB History    Gravida  4   Para  3   Term  3   Preterm      AB      Living  3     SAB      TAB      Ectopic      Multiple  0   Live Births  3           Past Medical History:  Diagnosis Date  . Abnormal Pap smear   . Depression    stopped meds, was wanting to see counselor  . Eczema   . Erb's palsy    L arm  . Erb's palsy    left arm  . Herpes    frequent outbreaks on valtrex 500  . History of chlamydia   . 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 History of gonorrhea   . Hydradenitis   . Infection    UTI  . Kidney stones   . Ovarian cyst   . Periodontal disease   . PID (pelvic inflammatory disease)   . Trichomonas contact     Past Surgical History:  Procedure Laterality Date  . NO PAST SURGERIES      Family History  Problem Relation Age of Onset  . Depression Mother   . Diabetes Mother   . Hypertension Mother   . Heart disease Mother   . Asthma Sister   . Asthma Daughter   . Hypertension Maternal Grandmother   . Diabetes Maternal Grandmother     Social History   Tobacco Use  . Smoking status: Former Smoker    Packs/day: 0.50    Years: 2.00    Pack years: 1.00    Types: Cigarettes  . Smokeless tobacco: Never Used  Substance Use Topics  . Alcohol use: No    Comment: occ  . Drug use: No    Allergies: No Known Allergies  Medications Prior to Admission  Medication Sig Dispense Refill Last Dose  . Benzoyl Peroxide (BENZOYL PEROXIDE) 10 % LIQD Apply topically.   Taking  . benztropine (COGENTIN) 0.5 MG  tablet Take 0.5 mg by mouth 2 (two) times daily.  0 Taking  . clindamycin (CLEOCIN T) 1 % external solution APPLY TO UNDERARMS,GROIN AND BUTTOCK TWICE A DAY  6 Taking  . [facility] MELATONIN 3 MG TABS TAKE 1 TO 2 TABLET AT BEDTIME, AS NEEDED  0 Taking  . doxycycline (VIBRA-TABS) 100 MG tablet Take 100 mg by mouth 2 (two) times daily.  1 Taking  . PARoxetine (PAXIL) 10 MG tablet Take 10 mg by mouth daily.   Taking  . risperiDONE (RISPERDAL) 1 MG tablet Take 1 mg by mouth at bedtime.   Taking  . traZODone (DESYREL) 50 MG tablet Take 50 mg by mouth at bedtime.   Taking  . valACYclovir (VALTREX) 1000 MG tablet Take 1 tablet (1,000 mg total) by mouth daily. 30 tablet 11 Taking    Review of Systems  Constitutional:  Negative for chills and fever.  Gastrointestinal: Negative for abdominal pain.  Genitourinary: Positive for vaginal discharge.   Physical Exam   Blood pressure 120/67, pulse 78, temperature 98 F (36.7 C), temperature source Oral, resp. rate 20, last menstrual period 05/31/2018, SpO2 98 %, not currently breastfeeding.  Physical Exam  Constitutional: She is oriented to person, place, and time. She appears well-developed and well-nourished. No distress.  HENT:  Head: Normocephalic and atraumatic.  Neck: Normal range of motion.  Cardiovascular: Normal rate.  Respiratory: Effort normal. No respiratory distress.  GI: Soft. She exhibits no distension. There is no abdominal tenderness.  Genitourinary:    Genitourinary Comments: External: no lesions or erythema Vagina: rugated, pink, moist, small bloody discharge Cervix closed    Musculoskeletal: Normal range of motion.  Neurological: She is alert and oriented to person, place, and time.  Skin: Skin is warm and dry.  Psychiatric: She has a normal mood and affect.   Results for orders placed or performed during the hospital encounter of 07/17/18 (from the past 24 hour(s))  CBC     Status: Abnormal   Collection Time: 07/17/18 11:41 AM   Result Value Ref Range   WBC 10.6 (H) 4.0 - 10.5 K/uL   RBC 4.73 3.87 - 5.11 MIL/uL   Hemoglobin 13.8 12.0 - 15.0 g/dL   HCT 40.941.0 81.136.0 - 91.446.0 %   MCV 86.7 80.0 - 100.0 fL   MCH 29.2 26.0 - 34.0 pg   MCHC 33.7 30.0 - 36.0 g/dL   RDW 78.214.3 95.611.5 - 21.315.5 %   Platelets 294 150 - 400 K/uL   nRBC 0.0 0.0 - 0.2 %  hCG, quantitative, pregnancy     Status: Abnormal   Collection Time: 07/17/18 11:41 AM  Result Value Ref Range   hCG, Beta Chain, Quant, S 612 (H) <5 mIU/mL  Rh IG workup (includes ABO/Rh)     Status: None (Preliminary result)   Collection Time: 07/17/18 11:41 AM  Result Value Ref Range   Gestational Age(Wks) 6    ABO/RH(D) B NEG    Antibody Screen NEG    Unit Number [id number]    Blood Component Type RHIG    Unit division 00    Status of Unit ISSUED    Transfusion Status      OK TO TRANSFUSE Performed at [facility], [address] [place], [place] [address]    [place] Ob Transvaginal  Result Date: 07/17/2018 CLINICAL DATA:  Vaginal bleeding in early pregnancy EXAM: TRANSVAGINAL OB ULTRASOUND TECHNIQUE: Transvaginal ultrasound was performed for complete evaluation of the gestation as well as the maternal uterus, adnexal regions, and pelvic cul-de-sac. COMPARISON:  07/15/2018 FINDINGS: Intrauterine gestational sac: None identified. Tiny probable gestational sac seen on the previous exam no longer visualized Yolk sac:  N/A Embryo:  N/A Cardiac Activity: N/A Heart Rate: N/A bpm MSD:   mm    w     d CRL:     mm    w  d                  US EDC: Subchorionic hemorrhage:  N/A Maternal uterus/adnexae: On real-time imaging, motion is seen within the thickened endometrial complex question component of blood within the endometrial canal. Heterogeneous endometrial complex 13 mm thick. Uterus otherwise normal in appearance. RIGHT ovary normal size and morphology, 1.7 x 3.2 x 2.3 cm. LEFT ovary normal size and morphology 1.6 x 3.2 x 2.1 cm. Trace free pelvic fluid. No adnexal masses.  IMPRESSION: Tiny probable gestational sac seen within the uterus on the previous exam no longer identified consistent with nonviable pregnancy. Motion within the endometrial canal question blood within endometrial canal. No other pelvic sonographic abnormalities. Electronically Signed   By: Ulyses SouthwardMark  Boles M.D.   On: 07/17/2018 14:30   MAU Course  Procedures Orders Placed This Encounter  Procedures  . US OB Transvaginal    Standing Status:   Standing    Number of Occurrences:   1    Order Specific Question:   What location should the exam be performed?    Answer:   [id number]    Order Specific Question:   Symptom/Reason for Exam    Answer:   Vaginal bleeding in pregnancy [705036]  . CBC    Standing Status:   Standing    Number of Occurrences:   1  . hCG, quantitative, pregnancy    Standing Status:   Standing    Number of Occurrences:   1  . Rh IG workup (includes ABO/Rh)    Standing Status:   Standing    Number of Occurrences:   1    Order Specific Question:   Weeks of Gestation    Answer:   6  . Discharge patient    Order Specific Question:   Discharge disposition    Answer:   01-Home or Self Care [1]    Order Specific Question:   Discharge patient date    Answer:   07/17/2018   Meds ordered this encounter  Medications  . rho (d) immune globulin (RHIG/RHOPHYLAC) injection 300 mcg   MDM Labs and US ordered and reviewed. Rhogam given. [phone]) and probable IUGS no longer seen on US today likely indicates SAB but will follow quant HCG in 2 days. Discussed findings with pt. Stable for discharge home.   Assessment and Plan   1. Pregnancy, location unknown   2. Vaginal bleeding in pregnancy   3. Rh negative state in antepartum period    Discharge home Follow up in [facility] on 07/19/18 @1400  Return precautions  Allergies as of 07/17/2018   No Known Allergies     Medication List    STOP taking these medications   [facility] MELATONIN 3 MG Tabs Generic drug:  Melatonin    doxycycline 100 MG tablet Commonly known as:  VIBRA-TABS   valACYclovir 1000 MG tablet Commonly known as:  VALTREX     TAKE these medications   benzoyl peroxide 10 % Liqd Generic drug:  Benzoyl Peroxide Apply topically.   benztropine 0.5 MG tablet Commonly known as:  COGENTIN Take 0.5 mg by mouth 2 (two) times daily.   clindamycin 1 % external solution Commonly known as:  CLEOCIN T APPLY TO UNDERARMS,GROIN AND BUTTOCK TWICE A DAY   PARoxetine 10 MG tablet Commonly known as:  PAXIL Take 10 mg by mouth daily.   risperiDONE 1 MG tablet Commonly known as:  RISPERDAL Take 1 mg by mouth at bedtime.   traZODone 50 MG tablet Commonly known as:  DESYREL Take 50 mg by mouth at bedtime.      Donette LarryMelanie Stepehn Eckard, CNM 07/17/2018, 2:52 PM

## 2018-07-17 NOTE — Discharge Instructions (Signed)
Vaginal Bleeding During Pregnancy, First Trimester    A small amount of bleeding (spotting) from the vagina is common during early pregnancy. Sometimes the bleeding is normal and does not cause problems. At other times, though, bleeding may be a sign of something serious. Tell your doctor about any bleeding from your vagina right away.  Follow these instructions at home:  Activity  · Follow your doctor's instructions about how active you can be.  · If needed, make plans for someone to help with your normal activities.  · Do not have sex or orgasms until your doctor says that this is safe.  General instructions  · Take over-the-counter and prescription medicines only as told by your doctor.  · Watch your condition for any changes.  · Write down:  ? The number of pads you use each day.  ? How often you change pads.  ? How soaked (saturated) your pads are.  · Do not use tampons.  · Do not douche.  · If you pass any tissue from your vagina, save it to show to your doctor.  · Keep all follow-up visits as told by your doctor. This is important.  Contact a doctor if:  · You have vaginal bleeding at any time while you are pregnant.  · You have cramps.  · You have a fever.  Get help right away if:  · You have very bad cramps in your back or belly (abdomen).  · You pass large clots or a lot of tissue from your vagina.  · Your bleeding gets worse.  · You feel light-headed.  · You feel weak.  · You pass out (faint).  · You have chills.  · You are leaking fluid from your vagina.  · You have a gush of fluid from your vagina.  Summary  · Sometimes vaginal bleeding during pregnancy is normal and does not cause problems. At other times, bleeding may be a sign of something serious.  · Tell your doctor about any bleeding from your vagina right away.  · Follow your doctor's instructions about how active you can be. You may need someone to help you with your normal activities.  This information is not intended to replace advice given to  you by your health care provider. Make sure you discuss any questions you have with your health care provider.  Document Released: 10/14/2013 Document Revised: 08/31/2016 Document Reviewed: 08/31/2016  Elsevier Interactive Patient Education © 2019 Elsevier Inc.

## 2018-07-18 ENCOUNTER — Other Ambulatory Visit: Payer: Medicaid Other

## 2018-07-18 ENCOUNTER — Encounter: Payer: Self-pay | Admitting: *Deleted

## 2018-07-18 LAB — RH IG WORKUP (INCLUDES ABO/RH)
ABO/RH(D): B NEG
ANTIBODY SCREEN: NEGATIVE
Gestational Age(Wks): 6
Unit division: 0

## 2018-07-18 NOTE — Progress Notes (Signed)
Diamond Burton for Stat bhcg appt but per chart review noted she was seen in mau yesterday and was scheduled for 07/19/18 and is on schedule for 07/19/18

## 2018-07-19 ENCOUNTER — Ambulatory Visit: Payer: Medicaid Other

## 2018-07-19 ENCOUNTER — Encounter: Payer: Self-pay | Admitting: *Deleted

## 2018-07-19 ENCOUNTER — Telehealth: Payer: Self-pay | Admitting: *Deleted

## 2018-07-19 NOTE — Telephone Encounter (Addendum)
Coral Else [facility] stat bhcg appointment. I called number on file and a female answered phone- he states he will give her the message she missed her appointment and to call us to reschedule. I will also send a letter to notify her.

## 2018-08-02 ENCOUNTER — Ambulatory Visit: Payer: Medicaid Other | Admitting: Obstetrics

## 2018-08-07 ENCOUNTER — Encounter: Payer: Self-pay | Admitting: Obstetrics

## 2018-08-07 ENCOUNTER — Other Ambulatory Visit: Payer: Self-pay

## 2018-08-07 ENCOUNTER — Ambulatory Visit: Payer: Medicaid Other | Admitting: Obstetrics

## 2018-08-07 ENCOUNTER — Other Ambulatory Visit (HOSPITAL_COMMUNITY)
Admission: RE | Admit: 2018-08-07 | Discharge: 2018-08-07 | Disposition: A | Discharge status: Home / Self Care | Payer: Medicaid Other | Source: Ambulatory Visit | Attending: Obstetrics | Admitting: Obstetrics

## 2018-08-07 VITALS — BP 121/68 | HR 74 | Ht 65.0 in | Wt 237.0 lb

## 2018-08-07 DIAGNOSIS — N946 Dysmenorrhea, unspecified: Secondary | ICD-10-CM

## 2018-08-07 DIAGNOSIS — N898 Other specified noninflammatory disorders of vagina: Secondary | ICD-10-CM | POA: Diagnosis not present

## 2018-08-07 DIAGNOSIS — Z113 Encounter for screening for infections with a predominantly sexual mode of transmission: Secondary | ICD-10-CM

## 2018-08-07 DIAGNOSIS — Z8759 Personal history of other complications of pregnancy, childbirth and the puerperium: Secondary | ICD-10-CM | POA: Diagnosis not present

## 2018-08-07 DIAGNOSIS — Z202 Contact with and (suspected) exposure to infections with a predominantly sexual mode of transmission: Secondary | ICD-10-CM | POA: Insufficient documentation

## 2018-08-07 MED ORDER — IBUPROFEN 800 MG PO TABS: 800.0000 mg | ORAL_TABLET | Freq: Three times a day (TID) | ORAL | 5 refills | Status: DC | PRN

## 2018-08-07 NOTE — Progress Notes (Signed)
Patient ID: Diamond Burton, female   DOB: [birth year], 33 y.o.   MRN: [id number]  Chief Complaint  Patient presents with  . Miscarriage    HPI Diamond Burton is a 33 y.o. female.  History of SAB ~ 2 weeks ago.  Presents today for follow up.  Complains of some irregular cramping and possible yeast infection. HPI  Past Medical History:  Diagnosis Date  . Abnormal Pap smear   . Depression    stopped meds, was wanting to see counselor  . Eczema   . Erb's palsy    L arm  . Erb's palsy    left arm  . Herpes    frequent outbreaks on valtrex 500  . History of chlamydia   . 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 History of gonorrhea   . Hydradenitis   . Infection    UTI  . Kidney stones   . Ovarian cyst   . Periodontal disease   . PID (pelvic inflammatory disease)   . Trichomonas contact     Past Surgical History:  Procedure Laterality Date  . NO PAST SURGERIES      Family History  Problem Relation Age of Onset  . Depression Mother   . Diabetes Mother   . Hypertension Mother   . Heart disease Mother   . Asthma Sister   . Asthma Daughter   . Hypertension Maternal Grandmother   . Diabetes Maternal Grandmother     Social History Social History   Tobacco Use  . Smoking status: Former Smoker    Packs/day: 0.50    Years: 2.00    Pack years: 1.00    Types: Cigarettes  . Smokeless tobacco: Never Used  Substance Use Topics  . Alcohol use: No    Comment: occ  . Drug use: No    Allergies  Allergen Reactions  . Latex     Current Outpatient Medications  Medication Sig Dispense Refill  . Benzoyl Peroxide (BENZOYL PEROXIDE) 10 % LIQD Apply topically.    . benztropine (COGENTIN) 0.5 MG tablet Take 0.5 mg by mouth 2 (two) times daily.  0  . clindamycin (CLEOCIN T) 1 % external solution APPLY TO UNDERARMS,GROIN AND BUTTOCK TWICE A DAY  6  . PARoxetine (PAXIL) 10 MG tablet Take 10 mg by mouth daily.    . risperiDONE (RISPERDAL) 1 MG tablet Take 1 mg by mouth  at bedtime.    . traZODone (DESYREL) 50 MG tablet Take 50 mg by mouth at bedtime.    [name] ibuprofen (ADVIL,MOTRIN) 800 MG tablet Take 1 tablet (800 mg total) by mouth every 8 (eight) hours as needed. 60 tablet 5   No current facility-administered medications for this visit.     Review of Systems Review of Systems Constitutional: negative for fatigue and weight loss Respiratory: negative for cough and wheezing Cardiovascular: negative for chest pain, fatigue and palpitations Gastrointestinal: negative for abdominal pain and change in bowel habits Genitourinary:positive for irregular cramping and vaginal discharge and irritation Integument/breast: negative for nipple discharge Musculoskeletal:negative for myalgias Neurological: negative for gait problems and tremors Behavioral/Psych: negative for abusive relationship, depression Endocrine: negative for temperature intolerance      Blood pressure 121/68, pulse 74, height 5\' 5"  (1.651 m), weight 237 lb (107.5 kg), last menstrual period 05/31/2018, unknown if currently breastfeeding.  Physical Exam Physical Exam           General:  Alert and no distress Abdomen:  normal findings: no organomegaly, soft, non-tender  and no hernia  Pelvis:  External genitalia: normal general appearance Urinary system: urethral meatus normal and bladder without fullness, nontender Vaginal: normal without tenderness, induration or masses Cervix: normal appearance Adnexa: normal bimanual exam Uterus: anteverted and non-tender, normal size    50% of 15 min visit spent on counseling and coordination of care.   Data Reviewed Wet Prep and Cultures  Assessment     1. History of spontaneous abortion  2. Vaginal discharge Rx: - Cervicovaginal ancillary only( [facility])  3. Screen for STD (sexually transmitted disease)  4. Severe dysmenorrhea Rx: - ibuprofen (ADVIL,MOTRIN) 800 MG tablet; Take 1 tablet (800 mg total) by mouth every 8 (eight) hours as  needed.  Dispense: 60 tablet; Refill: 5   Plan    Follow up in 6 weeks    No orders of the defined types were placed in this encounter.  Meds ordered this encounter  Medications  . ibuprofen (ADVIL,MOTRIN) 800 MG tablet    Sig: Take 1 tablet (800 mg total) by mouth every 8 (eight) hours as needed.    Dispense:  60 tablet    Refill:  5    Brock Bad MD 08-07-2018

## 2018-08-09 LAB — CERVICOVAGINAL ANCILLARY ONLY
Bacterial vaginitis: NEGATIVE
Candida vaginitis: NEGATIVE
Chlamydia: NEGATIVE
Neisseria Gonorrhea: NEGATIVE
TRICH (WINDOWPATH): NEGATIVE

## 2018-09-11 ENCOUNTER — Inpatient Hospital Stay (HOSPITAL_COMMUNITY): Payer: Medicaid Other

## 2018-09-11 ENCOUNTER — Inpatient Hospital Stay (HOSPITAL_COMMUNITY)
Admission: AD | Admit: 2018-09-11 | Discharge: 2018-09-11 | Discharge status: Against Medical Advice | Payer: Medicaid Other | Attending: Obstetrics & Gynecology | Admitting: Obstetrics & Gynecology

## 2018-09-11 ENCOUNTER — Other Ambulatory Visit: Payer: Self-pay

## 2018-09-11 ENCOUNTER — Encounter (HOSPITAL_COMMUNITY): Payer: Self-pay | Admitting: *Deleted

## 2018-09-11 DIAGNOSIS — Z87891 Personal history of nicotine dependence: Secondary | ICD-10-CM | POA: Diagnosis not present

## 2018-09-11 DIAGNOSIS — O26891 Other specified pregnancy related conditions, first trimester: Secondary | ICD-10-CM | POA: Insufficient documentation

## 2018-09-11 DIAGNOSIS — Z79899 Other long term (current) drug therapy: Secondary | ICD-10-CM | POA: Insufficient documentation

## 2018-09-11 DIAGNOSIS — O99341 Other mental disorders complicating pregnancy, first trimester: Secondary | ICD-10-CM | POA: Diagnosis not present

## 2018-09-11 DIAGNOSIS — O09291 Supervision of pregnancy with other poor reproductive or obstetric history, first trimester: Secondary | ICD-10-CM | POA: Diagnosis not present

## 2018-09-11 DIAGNOSIS — F329 Major depressive disorder, single episode, unspecified: Secondary | ICD-10-CM | POA: Insufficient documentation

## 2018-09-11 DIAGNOSIS — Z3A01 Less than 8 weeks gestation of pregnancy: Secondary | ICD-10-CM | POA: Diagnosis not present

## 2018-09-11 DIAGNOSIS — R109 Unspecified abdominal pain: Secondary | ICD-10-CM | POA: Insufficient documentation

## 2018-09-11 DIAGNOSIS — R45851 Suicidal ideations: Secondary | ICD-10-CM

## 2018-09-11 DIAGNOSIS — O219 Vomiting of pregnancy, unspecified: Secondary | ICD-10-CM | POA: Insufficient documentation

## 2018-09-11 DIAGNOSIS — R45850 Homicidal ideations: Secondary | ICD-10-CM

## 2018-09-11 DIAGNOSIS — O209 Hemorrhage in early pregnancy, unspecified: Secondary | ICD-10-CM | POA: Insufficient documentation

## 2018-09-11 LAB — COMPREHENSIVE METABOLIC PANEL
ALT: 17 U/L (ref 0–44)
AST: 18 U/L (ref 15–41)
Albumin: 3.6 g/dL (ref 3.5–5.0)
Alkaline Phosphatase: 95 U/L (ref 38–126)
Anion gap: 10 (ref 5–15)
BUN: 6 mg/dL (ref 6–20)
CO2: 24 mmol/L (ref 22–32)
Calcium: 9.5 mg/dL (ref 8.9–10.3)
Chloride: 101 mmol/L (ref 98–111)
Creatinine, Ser: 0.77 mg/dL (ref 0.44–1.00)
GFR calc Af Amer: >60 mL/min (ref >60)
GFR calc non Af Amer: >60 mL/min (ref >60)
Glucose, Bld: 97 mg/dL (ref 70–99)
Potassium: 3.7 mmol/L (ref 3.5–5.1)
Sodium: 135 mmol/L (ref 135–145)
Total Bilirubin: 0.6 mg/dL (ref 0.3–1.2)
Total Protein: 7.4 g/dL (ref 6.5–8.1)

## 2018-09-11 LAB — CBC WITH DIFFERENTIAL/PLATELET
Abs Immature Granulocytes: 0.02 10*3/uL (ref 0.00–0.07)
Basophils Absolute: 0.1 10*3/uL (ref 0.0–0.1)
Basophils Relative: 1 %
Eosinophils Absolute: 0 10*3/uL (ref 0.0–0.5)
Eosinophils Relative: 1 %
HCT: 41.9 % (ref 36.0–46.0)
HEMOGLOBIN: 14 g/dL (ref 12.0–15.0)
Immature Granulocytes: 0 %
LYMPHS PCT: 32 %
Lymphs Abs: 2.7 10*3/uL (ref 0.7–4.0)
MCH: 29.3 pg (ref 26.0–34.0)
MCHC: 33.4 g/dL (ref 30.0–36.0)
MCV: 87.7 fL (ref 80.0–100.0)
Monocytes Absolute: 0.5 10*3/uL (ref 0.1–1.0)
Monocytes Relative: 6 %
Neutro Abs: 5 10*3/uL (ref 1.7–7.7)
Neutrophils Relative %: 60 %
Platelets: 306 10*3/uL (ref 150–400)
RBC: 4.78 MIL/uL (ref 3.87–5.11)
RDW: 14 % (ref 11.5–15.5)
WBC: 8.3 10*3/uL (ref 4.0–10.5)
nRBC: 0 % (ref 0.0–0.2)

## 2018-09-11 LAB — WET PREP, GENITAL
Clue Cells Wet Prep HPF POC: NONE SEEN
Sperm: NONE SEEN
Trich, Wet Prep: NONE SEEN
Yeast Wet Prep HPF POC: NONE SEEN

## 2018-09-11 LAB — URINALYSIS, ROUTINE W REFLEX MICROSCOPIC
Bilirubin Urine: NEGATIVE
Glucose, UA: NEGATIVE mg/dL
Hgb urine dipstick: NEGATIVE
Ketones, ur: NEGATIVE mg/dL
Leukocytes,Ua: NEGATIVE
Nitrite: NEGATIVE
PROTEIN: NEGATIVE mg/dL
Specific Gravity, Urine: 1.021 (ref 1.005–1.030)
pH: 5 (ref 5.0–8.0)

## 2018-09-11 LAB — POCT PREGNANCY, URINE: PREG TEST UR: POSITIVE — AB

## 2018-09-11 LAB — HCG, QUANTITATIVE, PREGNANCY: hCG, Beta Chain, Quant, S: 64681 m[IU]/mL — ABNORMAL HIGH (ref <5)

## 2018-09-11 NOTE — MAU Note (Signed)
[facility] contacted regarding consult order, states they will call for her in about an hour. Reports that pt needs a note in from the provider and something stating she is medically clear, message passed on to [facility]

## 2018-09-11 NOTE — MAU Note (Signed)
With CNM in the room, pt verbalizes that she is only here to have her pregnancy checked out not her mental health checked out, plans to call her psychiatrist if she needs help. States that we cannot make her do the telepsych. Pt signs AMA form and leaves the unit.

## 2018-09-11 NOTE — MAU Note (Signed)
Had a miscarriage in Feb.  Went to dermatologist  For antibiotics, preg test was + x2.   Has been cramping and having really bad morning sickness.

## 2018-09-11 NOTE — MAU Note (Signed)
Pt reports that within the past month she has wished she was dead and also wished harm upon her significant other. Pt has no plan or intent.

## 2018-09-11 NOTE — Progress Notes (Signed)
TTS/[facility] office received an assessment consult ordered before a triage note or provider note has been entered in pt's chart.  CSW called and spoke to [place], Charity fundraiser and advised of same.  TTS will be completed after these notes are entered at a minimum.  Provider note should include information regarding the patient's medical stability and shoul;d reflect whether the patient is medically cleared.  Timmothy Euler. Kaylyn Lim, MSW, LCSWA Disposition Clinical Social Work [phone] (cell) [phone] (office)

## 2018-09-11 NOTE — MAU Provider Note (Signed)
History     CSN: [id number]  Arrival date and time: 09/11/18 1013   First Provider Initiated Contact with Patient 09/11/18 1103      Chief Complaint  Patient presents with  . Possible Pregnancy  . Abdominal Pain   HPI Diamond Burton is a 33 y.o. [id number] at [redacted]weeks gestation with uncertain LMP who presents to MAU with chief complaint of abdominal cramping. This is a recurring problem, onset with beginning of pregnancy. Patient also complains of recurrent nausea with episodes of vomiting throughout the day. She denies vaginal bleeding, abnormal vaginal discharge, urinary complaints, fever, falls, or recent illness.    Patient verbalizes to nurse that she thinks of harming herself and killing others when she is stressed. She does not have a plan for carrying out this behavior. She is on medication for this problem and sees a psychiatrist one each month but discontinued her medications when she learned she was pregnant. She states she also manages this problem by listening to [employer] and taking warm baths.    OB History    Gravida  5   Para  3   Term  3   Preterm      AB  1   Living  3     SAB  1   TAB      Ectopic      Multiple  0   Live Births  3           Past Medical History:  Diagnosis Date  . Abnormal Pap smear   . Depression    stopped meds, was wanting to see counselor  . Eczema   . Erb's palsy    L arm  . Erb's palsy    left arm  . Herpes    frequent outbreaks on valtrex 500  . History of chlamydia   . 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 History of gonorrhea   . Hydradenitis   . Infection    UTI  . Kidney stones   . Ovarian cyst   . Periodontal disease   . PID (pelvic inflammatory disease)   . Trichomonas contact     Past Surgical History:  Procedure Laterality Date  . NO PAST SURGERIES      Family History  Problem Relation Age of Onset  . Depression Mother   . Diabetes Mother   . Hypertension Mother   . Heart disease  Mother   . Asthma Sister   . Asthma Daughter   . Hypertension Maternal Grandmother   . Diabetes Maternal Grandmother     Social History   Tobacco Use  . Smoking status: Former Smoker    Packs/day: 0.50    Years: 2.00    Pack years: 1.00    Types: Cigarettes  . Smokeless tobacco: Never Used  Substance Use Topics  . Alcohol use: No    Comment: occ  . Drug use: No    Allergies:  Allergies  Allergen Reactions  . Latex     Medications Prior to Admission  Medication Sig Dispense Refill Last Dose  . Benzoyl Peroxide (BENZOYL PEROXIDE) 10 % LIQD Apply topically.   Taking  . benztropine (COGENTIN) 0.5 MG tablet Take 0.5 mg by mouth 2 (two) times daily.  0 Taking  . clindamycin (CLEOCIN T) 1 % external solution APPLY TO UNDERARMS,GROIN AND BUTTOCK TWICE A DAY  6 Taking  . ibuprofen (ADVIL,MOTRIN) 800 MG tablet Take 1 tablet (800 mg total) by mouth  every 8 (eight) hours as needed. 60 tablet 5   . PARoxetine (PAXIL) 10 MG tablet Take 10 mg by mouth daily.   Taking  . risperiDONE (RISPERDAL) 1 MG tablet Take 1 mg by mouth at bedtime.   Taking  . traZODone (DESYREL) 50 MG tablet Take 50 mg by mouth at bedtime.   Taking    Review of Systems  Constitutional: Negative for chills, fatigue and fever.  Gastrointestinal: Positive for abdominal pain, nausea and vomiting.  Genitourinary: Negative for difficulty urinating, dysuria, flank pain, vaginal bleeding, vaginal discharge and vaginal pain.  Musculoskeletal: Negative for back pain.  Neurological: Negative for headaches.  All other systems reviewed and are negative.  Physical Exam   Blood pressure 118/69, pulse 62, temperature 98.1 F (36.7 C), temperature source Oral, resp. rate 18, height  (1.651 m), weight 104.5 kg, SpO2 99 %, unknown if currently breastfeeding.  Physical Exam  Nursing note and vitals reviewed. Constitutional: She is oriented to person, place, and time. She appears well-developed and well-nourished.   Cardiovascular: Normal rate.  Respiratory: Effort normal.  GI: Soft. She exhibits no distension. There is no abdominal tenderness. There is no rebound and no guarding.  Genitourinary:    No vaginal discharge.     Genitourinary Comments: Swabs collected via blind swab by CNM. No abnormal discharge noted during collection   Neurological: She is alert and oriented to person, place, and time.  Skin: Skin is warm and dry.  Psychiatric: She has a normal mood and affect. Her behavior is normal. Judgment and thought content normal.    MAU Course/MDM  Procedures   --Patient is extremely agitated upon multiple discussions with RN and CNM regarding the telepsych consult. She believes CNM is "unstable" and "the mentally ill one". She verbalizes that she has received sub-standard care in MAU and is "not interested in anything except her pregnancy --House coverage in unit to address patient report of sub-standard care   Patient Vitals for the past 24 hrs:  BP Temp Temp src Pulse Resp SpO2 Height Weight  09/11/18 1036 118/69 98.1 F (36.7 C) Oral 62 18 99 %  (1.651 m) 104.5 kg    Results for orders placed or performed during the hospital encounter of 09/11/18 (from the past 24 hour(s))  Urinalysis, Routine w reflex microscopic     Status: None   Collection Time: 09/11/18 10:43 AM  Result Value Ref Range   Color, Urine YELLOW YELLOW   APPearance CLEAR CLEAR   Specific Gravity, Urine 1.021 1.005 - 1.030   pH 5.0 5.0 - 8.0   Glucose, UA NEGATIVE NEGATIVE mg/dL   Hgb urine dipstick NEGATIVE NEGATIVE   Bilirubin Urine NEGATIVE NEGATIVE   Ketones, ur NEGATIVE NEGATIVE mg/dL   Protein, ur NEGATIVE NEGATIVE mg/dL   Nitrite NEGATIVE NEGATIVE   Leukocytes,Ua NEGATIVE NEGATIVE  Pregnancy, urine POC     Status: Abnormal   Collection Time: 09/11/18 10:44 AM  Result Value Ref Range   Preg Test, Ur POSITIVE (A) NEGATIVE  Wet prep, genital     Status: Abnormal   Collection Time: 09/11/18 11:04 AM   Result Value Ref Range   Yeast Wet Prep HPF POC NONE SEEN NONE SEEN   Trich, Wet Prep NONE SEEN NONE SEEN   Clue Cells Wet Prep HPF POC NONE SEEN NONE SEEN   WBC, Wet Prep HPF POC FEW (A) NONE SEEN   Sperm NONE SEEN   CBC with Differential/Platelet     Status: None  Collection Time: 09/11/18 11:27 AM  Result Value Ref Range   WBC 8.3 4.0 - 10.5 K/uL   RBC 4.78 3.87 - 5.11 MIL/uL   Hemoglobin 14.0 12.0 - 15.0 g/dL   HCT 19.3 79.0 - 24.0 %   MCV 87.7 80.0 - 100.0 fL   MCH 29.3 26.0 - 34.0 pg   MCHC 33.4 30.0 - 36.0 g/dL   RDW 97.3 53.2 - 99.2 %   Platelets 306 150 - 400 K/uL   nRBC 0.0 0.0 - 0.2 %   Neutrophils Relative % 60 %   Neutro Abs 5.0 1.7 - 7.7 K/uL   Lymphocytes Relative 32 %   Lymphs Abs 2.7 0.7 - 4.0 K/uL   Monocytes Relative 6 %   Monocytes Absolute 0.5 0.1 - 1.0 K/uL   Eosinophils Relative 1 %   Eosinophils Absolute 0.0 0.0 - 0.5 K/uL   Basophils Relative 1 %   Basophils Absolute 0.1 0.0 - 0.1 K/uL   Immature Granulocytes 0 %   Abs Immature Granulocytes 0.02 0.00 - 0.07 K/uL   US Ob Less Than 14 Weeks With Ob Transvaginal  Result Date: 09/11/2018 CLINICAL DATA:  Abdominal pain in first trimester of pregnancy, cramping since last night, history of miscarriage EXAM: OBSTETRIC <14 WK [place] AND TRANSVAGINAL OB US TECHNIQUE: Both transabdominal and transvaginal ultrasound examinations were performed for complete evaluation of the gestation as well as the maternal uterus, adnexal regions, and pelvic cul-de-sac. Transvaginal technique was performed to assess early pregnancy. COMPARISON:  None for this gestation FINDINGS: Intrauterine gestational sac: Present, single Yolk sac:  Present Embryo:  Present Cardiac Activity: Present Heart Rate: 130 bpm CRL:  7.9 mm   6 w   4 d                  [place] EDC: 05/03/2019 Subchorionic hemorrhage: Small subchronic hemorrhage present, 14 x 15 x 5 mm. Maternal uterus/adnexae: LEFT ovary normal size and morphology 3.5 x 1.9 x 2.2 cm, containing  small corpus luteum. RIGHT ovary normal size and morphology, 3.6 x 1.8 x 2.4 cm. No free pelvic fluid or adnexal masses. IMPRESSION: Single live intrauterine gestation at 6 weeks 4 days EGA by crown-rump length. Small subchronic hemorrhage. Electronically Signed   By: Ulyses Southward M.D.   On: 09/11/2018 11:58   Assessment and Plan  --33 y.o. [id number] with SIUP at 6w 4d by US performed today --Small subchorionic hemorrhage, reviewed pelvic rest, possiblity of future bleeding episodes --Suicidal and homicidal thoughts without plan, Telepsych consult ordered. Greater than 30 minutes total spent at bedside discussing ideation, threat to pregnancy, self and others.  Patient left AMA shortly after CNM reviewed ultrasound, prior to telepsych   Calvert Cantor, CNM 09/11/2018, 1:24 PM

## 2018-09-11 NOTE — MAU Note (Signed)
Dierdre RN called from [facility] informed RN that pt is off the unit for u/s. Will call [facility] when pt returns for telepsych consult.

## 2018-09-12 LAB — GC/CHLAMYDIA PROBE AMP (~~LOC~~) NOT AT ARMC
Chlamydia: NEGATIVE
Neisseria Gonorrhea: NEGATIVE

## 2018-09-18 ENCOUNTER — Other Ambulatory Visit: Payer: Self-pay | Admitting: Obstetrics

## 2018-09-18 ENCOUNTER — Ambulatory Visit: Payer: Medicaid Other | Admitting: Obstetrics

## 2018-09-18 ENCOUNTER — Telehealth: Payer: Self-pay | Admitting: Obstetrics

## 2018-09-18 DIAGNOSIS — O0990 Supervision of high risk pregnancy, unspecified, unspecified trimester: Secondary | ICD-10-CM

## 2018-09-18 DIAGNOSIS — O219 Vomiting of pregnancy, unspecified: Secondary | ICD-10-CM

## 2018-09-18 MED ORDER — PROMETHAZINE HCL 25 MG PO TABS: 25.0000 mg | ORAL_TABLET | Freq: Four times a day (QID) | ORAL | 1 refills | Status: DC | PRN

## 2018-09-18 MED ORDER — PRENATE MINI 29-0.6-0.4-350 MG PO CAPS: 1.0000 | ORAL_CAPSULE | Freq: Every day | ORAL | 5 refills | Status: DC

## 2018-09-18 NOTE — Telephone Encounter (Signed)
Telephone call to patient to postpone her New OB appointment as she is too early.  Rescheduled her to 10/05/18.  Patient denies and abdomina pain or cramping since her visit to MAU.  She has not started a PNV and is requesting something to help with nausea.   Medications eprescribed by Dr. Clearance Coots.

## 2018-10-02 ENCOUNTER — Telehealth: Payer: Self-pay | Admitting: *Deleted

## 2018-10-02 NOTE — Telephone Encounter (Signed)
Pt called to office wanting to discuss her "issues".  Attempt to return call. No answer, LM on VM to call if needed.

## 2018-10-05 ENCOUNTER — Encounter: Payer: Medicaid Other | Admitting: Obstetrics

## 2018-10-08 ENCOUNTER — Ambulatory Visit (INDEPENDENT_AMBULATORY_CARE_PROVIDER_SITE_OTHER): Payer: Medicaid Other | Admitting: Obstetrics & Gynecology

## 2018-10-08 ENCOUNTER — Other Ambulatory Visit: Payer: Self-pay

## 2018-10-08 ENCOUNTER — Encounter: Payer: Self-pay | Admitting: Obstetrics & Gynecology

## 2018-10-08 ENCOUNTER — Other Ambulatory Visit (HOSPITAL_COMMUNITY)
Admission: RE | Admit: 2018-10-08 | Discharge: 2018-10-08 | Disposition: A | Discharge status: Home / Self Care | Payer: Medicaid Other | Source: Ambulatory Visit | Attending: Obstetrics | Admitting: Obstetrics

## 2018-10-08 VITALS — BP 108/66 | HR 72 | Temp 97.1°F | Wt 233.0 lb

## 2018-10-08 DIAGNOSIS — A749 Chlamydial infection, unspecified: Secondary | ICD-10-CM

## 2018-10-08 DIAGNOSIS — B373 Candidiasis of vulva and vagina: Secondary | ICD-10-CM

## 2018-10-08 DIAGNOSIS — O99210 Obesity complicating pregnancy, unspecified trimester: Secondary | ICD-10-CM

## 2018-10-08 DIAGNOSIS — Z3481 Encounter for supervision of other normal pregnancy, first trimester: Secondary | ICD-10-CM | POA: Diagnosis not present

## 2018-10-08 DIAGNOSIS — N898 Other specified noninflammatory disorders of vagina: Secondary | ICD-10-CM | POA: Diagnosis present

## 2018-10-08 DIAGNOSIS — N760 Acute vaginitis: Secondary | ICD-10-CM

## 2018-10-08 DIAGNOSIS — O99211 Obesity complicating pregnancy, first trimester: Secondary | ICD-10-CM

## 2018-10-08 DIAGNOSIS — O0991 Supervision of high risk pregnancy, unspecified, first trimester: Secondary | ICD-10-CM

## 2018-10-08 DIAGNOSIS — Z87898 Personal history of other specified conditions: Secondary | ICD-10-CM

## 2018-10-08 DIAGNOSIS — O26899 Other specified pregnancy related conditions, unspecified trimester: Secondary | ICD-10-CM

## 2018-10-08 DIAGNOSIS — O98811 Other maternal infectious and parasitic diseases complicating pregnancy, first trimester: Secondary | ICD-10-CM

## 2018-10-08 DIAGNOSIS — O09891 Supervision of other high risk pregnancies, first trimester: Secondary | ICD-10-CM

## 2018-10-08 DIAGNOSIS — O26891 Other specified pregnancy related conditions, first trimester: Secondary | ICD-10-CM | POA: Diagnosis not present

## 2018-10-08 DIAGNOSIS — B3731 Acute candidiasis of vulva and vagina: Secondary | ICD-10-CM

## 2018-10-08 DIAGNOSIS — O09899 Supervision of other high risk pregnancies, unspecified trimester: Secondary | ICD-10-CM

## 2018-10-08 DIAGNOSIS — B9689 Other specified bacterial agents as the cause of diseases classified elsewhere: Secondary | ICD-10-CM

## 2018-10-08 DIAGNOSIS — O98211 Gonorrhea complicating pregnancy, first trimester: Secondary | ICD-10-CM

## 2018-10-08 DIAGNOSIS — Z3689 Encounter for other specified antenatal screening: Secondary | ICD-10-CM

## 2018-10-08 DIAGNOSIS — Z8619 Personal history of other infectious and parasitic diseases: Secondary | ICD-10-CM

## 2018-10-08 DIAGNOSIS — O0990 Supervision of high risk pregnancy, unspecified, unspecified trimester: Secondary | ICD-10-CM | POA: Insufficient documentation

## 2018-10-08 DIAGNOSIS — Z6791 Unspecified blood type, Rh negative: Secondary | ICD-10-CM

## 2018-10-08 MED ORDER — TERCONAZOLE 0.8 % VA CREA: 1.0000 | TOPICAL_CREAM | Freq: Every day | VAGINAL | 0 refills | Status: DC

## 2018-10-08 MED ORDER — ASPIRIN EC 81 MG PO TBEC: 81.0000 mg | DELAYED_RELEASE_TABLET | Freq: Every day | ORAL | 2 refills | Status: DC

## 2018-10-08 NOTE — Patient Instructions (Signed)
First Trimester of Pregnancy  The first trimester of pregnancy is from week 1 until the end of week 13 (months 1 through 3). A week after a sperm fertilizes an egg, the egg will implant on the wall of the uterus. This embryo will begin to develop into a baby. Genes from you and your partner will form the baby. The female genes will determine whether the baby will be a boy or a girl. At 6-8 weeks, the eyes and face will be formed, and the heartbeat can be seen on ultrasound. At the end of 12 weeks, all the baby's organs will be formed.  Now that you are pregnant, you will want to do everything you can to have a healthy baby. Two of the most important things are to get good prenatal care and to follow your health care provider's instructions. Prenatal care is all the medical care you receive before the baby's birth. This care will help prevent, find, and treat any problems during the pregnancy and childbirth.  Body changes during your first trimester  Your body goes through many changes during pregnancy. The changes vary from woman to woman.   You may gain or lose a couple of pounds at first.   You may feel sick to your stomach (nauseous) and you may throw up (vomit). If the vomiting is uncontrollable, call your health care provider.   You may tire easily.   You may develop headaches that can be relieved by medicines. All medicines should be approved by your health care provider.   You may urinate more often. Painful urination may mean you have a bladder infection.   You may develop heartburn as a result of your pregnancy.   You may develop constipation because certain hormones are causing the muscles that push stool through your intestines to slow down.   You may develop hemorrhoids or swollen veins (varicose veins).   Your breasts may begin to grow larger and become tender. Your nipples may stick out more, and the tissue that surrounds them (areola) may become darker.   Your gums may bleed and may be  sensitive to brushing and flossing.   Dark spots or blotches (chloasma, mask of pregnancy) may develop on your face. This will likely fade after the baby is born.   Your menstrual periods will stop.   You may have a loss of appetite.   You may develop cravings for certain kinds of food.   You may have changes in your emotions from day to day, such as being excited to be pregnant or being concerned that something may go wrong with the pregnancy and baby.   You may have more vivid and strange dreams.   You may have changes in your hair. These can include thickening of your hair, rapid growth, and changes in texture. Some women also have hair loss during or after pregnancy, or hair that feels dry or thin. Your hair will most likely return to normal after your baby is born.  What to expect at prenatal visits  During a routine prenatal visit:   You will be weighed to make sure you and the baby are growing normally.   Your blood pressure will be taken.   Your abdomen will be measured to track your baby's growth.   The fetal heartbeat will be listened to between weeks 10 and 14 of your pregnancy.   Test results from any previous visits will be discussed.  Your health care provider may ask you:     How you are feeling.   If you are feeling the baby move.   If you have had any abnormal symptoms, such as leaking fluid, bleeding, severe headaches, or abdominal cramping.   If you are using any tobacco products, including cigarettes, chewing tobacco, and electronic cigarettes.   If you have any questions.  Other tests that may be performed during your first trimester include:   Blood tests to find your blood type and to check for the presence of any previous infections. The tests will also be used to check for low iron levels (anemia) and protein on red blood cells (Rh antibodies). Depending on your risk factors, or if you previously had diabetes during pregnancy, you may have tests to check for high blood sugar  that affects pregnant women (gestational diabetes).   Urine tests to check for infections, diabetes, or protein in the urine.   An ultrasound to confirm the proper growth and development of the baby.   Fetal screens for spinal cord problems (spina bifida) and Down syndrome.   HIV (human immunodeficiency virus) testing. Routine prenatal testing includes screening for HIV, unless you choose not to have this test.   You may need other tests to make sure you and the baby are doing well.  Follow these instructions at home:  Medicines   Follow your health care provider's instructions regarding medicine use. Specific medicines may be either safe or unsafe to take during pregnancy.   Take a prenatal vitamin that contains at least 600 micrograms (mcg) of folic acid.   If you develop constipation, try taking a stool softener if your health care provider approves.  Eating and drinking     Eat a balanced diet that includes fresh fruits and vegetables, whole grains, good sources of protein such as meat, eggs, or tofu, and low-fat dairy. Your health care provider will help you determine the amount of weight gain that is right for you.   Avoid raw meat and uncooked cheese. These carry germs that can cause birth defects in the baby.   Eating four or five small meals rather than three large meals a day may help relieve nausea and vomiting. If you start to feel nauseous, eating a few soda crackers can be helpful. Drinking liquids between meals, instead of during meals, also seems to help ease nausea and vomiting.   Limit foods that are high in fat and processed sugars, such as fried and sweet foods.   To prevent constipation:  ? Eat foods that are high in fiber, such as fresh fruits and vegetables, whole grains, and beans.  ? Drink enough fluid to keep your urine clear or pale yellow.  Activity   Exercise only as directed by your health care provider. Most women can continue their usual exercise routine during  pregnancy. Try to exercise for 30 minutes at least 5 days a week. Exercising will help you:  ? Control your weight.  ? Stay in shape.  ? Be prepared for labor and delivery.   Experiencing pain or cramping in the lower abdomen or lower back is a good sign that you should stop exercising. Check with your health care provider before continuing with normal exercises.   Try to avoid standing for long periods of time. Move your legs often if you must stand in one place for a long time.   Avoid heavy lifting.   Wear low-heeled shoes and practice good posture.   You may continue to have sex unless your health care   provider tells you not to.  Relieving pain and discomfort   Wear a good support bra to relieve breast tenderness.   Take warm sitz baths to soothe any pain or discomfort caused by hemorrhoids. Use hemorrhoid cream if your health care provider approves.   Rest with your legs elevated if you have leg cramps or low back pain.   If you develop varicose veins in your legs, wear support hose. Elevate your feet for 15 minutes, 3-4 times a day. Limit salt in your diet.  Prenatal care   Schedule your prenatal visits by the twelfth week of pregnancy. They are usually scheduled monthly at first, then more often in the last 2 months before delivery.   Write down your questions. Take them to your prenatal visits.   Keep all your prenatal visits as told by your health care provider. This is important.  Safety   Wear your seat belt at all times when driving.   Make a list of emergency phone numbers, including numbers for family, friends, the hospital, and police and fire departments.  General instructions   Ask your health care provider for a referral to a local prenatal education class. Begin classes no later than the beginning of month 6 of your pregnancy.   Ask for help if you have counseling or nutritional needs during pregnancy. Your health care provider can offer advice or refer you to specialists for help  with various needs.   Do not use hot tubs, steam rooms, or saunas.   Do not douche or use tampons or scented sanitary pads.   Do not cross your legs for long periods of time.   Avoid cat litter boxes and soil used by cats. These carry germs that can cause birth defects in the baby and possibly loss of the fetus by miscarriage or stillbirth.   Avoid all smoking, herbs, alcohol, and medicines not prescribed by your health care provider. Chemicals in these products affect the formation and growth of the baby.   Do not use any products that contain nicotine or tobacco, such as cigarettes and e-cigarettes. If you need help quitting, ask your health care provider. You may receive counseling support and other resources to help you quit.   Schedule a dentist appointment. At home, brush your teeth with a soft toothbrush and be gentle when you floss.  Contact a health care provider if:   You have dizziness.   You have mild pelvic cramps, pelvic pressure, or nagging pain in the abdominal area.   You have persistent nausea, vomiting, or diarrhea.   You have a bad smelling vaginal discharge.   You have pain when you urinate.   You notice increased swelling in your face, hands, legs, or ankles.   You are exposed to fifth disease or chickenpox.   You are exposed to German measles (rubella) and have never had it.  Get help right away if:   You have a fever.   You are leaking fluid from your vagina.   You have spotting or bleeding from your vagina.   You have severe abdominal cramping or pain.   You have rapid weight gain or loss.   You vomit blood or material that looks like coffee grounds.   You develop a severe headache.   You have shortness of breath.   You have any kind of trauma, such as from a fall or a car accident.  Summary   The first trimester of pregnancy is from week 1 until   the end of week 13 (months 1 through 3).   Your body goes through many changes during pregnancy. The changes vary from  woman to woman.   You will have routine prenatal visits. During those visits, your health care provider will examine you, discuss any test results you may have, and talk with you about how you are feeling.  This information is not intended to replace advice given to you by your health care provider. Make sure you discuss any questions you have with your health care provider.  Document Released: 05/24/2001 Document Revised: 05/11/2016 Document Reviewed: 05/11/2016  Elsevier Interactive Patient Education  2019 [employer]

## 2018-10-08 NOTE — Progress Notes (Signed)
NOB  Planned: NO Genetic Screening: DESIRES Last pap:12/19/2017 WNL  MAU visit on 09/11/18 Per notes pt had Miscarriage in FEB   +UPT in March. U/S done on 09/11/18  CC: vaginal itching and discharge, pt notes vaginal swelling and burning after using dial soap.  c/o: back pain and cramping in left leg. Pt states she is very fatigue all the time.    Pt wants different PNV.

## 2018-10-08 NOTE — Progress Notes (Addendum)
History:   Diamond Burton is a 33 y.o. [id number] at [redacted]w[redacted]d by early ultrasound being seen today for her first obstetrical visit.  Her obstetrical history is significant for several problems. Patient Active Problem List   Diagnosis Date Noted  . Supervision of high-risk pregnancy, antepartum 10/08/2018  . Homicidal ideation 09/11/2018  . Suicidal ideation 09/11/2018  . 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 History of low birth weight infant 11/28/2016  . Rh negative state in antepartum period 08/02/2016  . Obesity in pregnancy, antepartum 08/02/2016  . History of herpes genitalis 06/01/2016  . History of maternal syphilis, currently pregnant 06/01/2016  . Hydradenitis   Patient does intend to breast feed. Pregnancy history fully reviewed.  Patient reports vaginal irritation and discharge for a few days, after she used Dial soap.  Feels she has a yeast infection      HISTORY: OB History  Gravida Para Term Preterm AB Living  0 1 3  SAB TAB Ectopic Multiple Live Births  1 0 0 0 3    # Outcome Date GA Lbr Len/2nd Weight Sex Delivery Anes PTL Lv  5 Current           4 SAB 07/2018          3 Term 11/29/16 [redacted]w[redacted]d 09:26 / 00:25 5 lb 11 oz (2.58 kg) M Vag-Vacuum EPI  LIV     Name: Diamond Burton     Apgar1: 8  Apgar5: 9  2 Term 06/28/11 [redacted]w[redacted]d 12:32 / 00:21 7 lb 1.1 oz (3.205 kg) F Vag-Spont EPI  LIV     Birth Comments: WNL     Name: Diamond Burton     Apgar1: 8  Apgar5: 9  1 Term 2005 [redacted]w[redacted]d  6 lb 8 oz (2.948 kg) F    LIV    Last pap smear was done 12/19/2017 and was normal  Past Medical History:  Diagnosis Date  . Abnormal Pap smear   . Depression    stopped meds, was wanting to see counselor  . Eczema   . Erb's palsy    L arm  . Erb's palsy    left arm  . Herpes    frequent outbreaks on valtrex 500  . History of chlamydia   . 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 History of gonorrhea   . Hydradenitis   . Infection    UTI  . Kidney stones   .  Ovarian cyst   . Periodontal disease   . PID (pelvic inflammatory disease)   . Trichomonas contact    Past Surgical History:  Procedure Laterality Date  . NO PAST SURGERIES     Family History  Problem Relation Age of Onset  . Depression Mother   . Diabetes Mother   . Hypertension Mother   . Heart disease Mother   . Asthma Sister   . Asthma Daughter   . Hypertension Maternal Grandmother   . Diabetes Maternal Grandmother    Social History   Tobacco Use  . Smoking status: Former Smoker    Packs/day: 0.50    Years: 2.00    Pack years: 1.00    Types: Cigarettes  . Smokeless tobacco: Never Used  Substance Use Topics  . Alcohol use: No    Comment: occ  . Drug use: No   Allergies  Allergen Reactions  . Latex    Current Outpatient Medications on File Prior to Visit  Medication Sig Dispense Refill  .  Prenat w/o A-FeCbn-Meth-FA-DHA (PRENATE MINI) 29-0.6-0.4-350 MG CAPS Take 1 capsule by mouth daily before breakfast. 30 capsule 5  . valACYclovir (VALTREX) 1000 MG tablet Take 1,000 mg by mouth daily.     No current facility-administered medications on file prior to visit.     Review of Systems Pertinent items noted in HPI and remainder of comprehensive ROS otherwise negative. Physical Exam:   Vitals:   10/08/18 0832  BP: 108/66  Pulse: 72  Temp: (!) 97.1 F (36.2 C)  Weight: 233 lb (105.7 kg)   Fetal Heart Rate (bpm): 164 Uterus:     Pelvic Exam: Perineum: no hemorrhoids, normal perineum   Vulva: normal external genitalia, +erythema around vulva   Vagina:  normal mucosa, thick, white discharge note   Bony Pelvis: average  System: General: well-developed, well-nourished female in no acute distress   Breasts:  normal appearance, no masses or tenderness bilaterally   Skin: normal coloration and turgor, no rashes   Neurologic: oriented, normal, negative, normal mood   Extremities: normal strength, tone, and muscle mass, ROM of all joints is normal   HEENT PERRLA,  extraocular movement intact and sclera clear, anicteric   Mouth/Teeth mucous membranes moist, pharynx normal without lesions and dental hygiene good   Neck supple and no masses   Cardiovascular: regular rate and rhythm   Respiratory:  no respiratory distress, normal breath sounds   Abdomen: soft, non-tender; bowel sounds normal; no masses,  no organomegaly    Assessment:    Pregnancy: [id number] Patient Active Problem List   Diagnosis Date Noted  . Supervision of normal pregnancy, antepartum 10/08/2018  . Homicidal ideation 09/11/2018  . Suicidal ideation 09/11/2018  . 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 History of low birth weight infant 11/28/2016  . Rh negative state in antepartum period 08/02/2016  . Obesity in pregnancy, antepartum 08/02/2016  . History of herpes genitalis 06/01/2016  . History of maternal syphilis, currently pregnant 06/01/2016  . Hydradenitis     Plan:    1. Vaginal discharge during pregnancy in first trimester Possibly yeast, presumptively treated. Proper vulvar hygiene emphasized: discussed avoidance of perfumed soaps, detergents, lotions and any type of douches; in addition to wearing cotton underwear and no underwear at night.  Also recommended cleaning front to back, voiding and cleaning up after intercourse.     - Cervicovaginal ancillary only( [place]) - terconazole (TERAZOL 3) 0.8 % vaginal cream; Place 1 applicator vaginally at bedtime. Apply nightly for three nights.  Dispense: 20 g; Refill: 0  2. History of low birth weight infant Had 5-11 infant at [redacted]w[redacted]d, will monitor growth accordingly  3. History of herpes genitalis Gets frequent outbreaks. On Valtrex. - valACYclovir (VALTREX) 1000 MG tablet; Take 1,000 mg by mouth daily.  4. History of maternal syphilis, currently pregnant Will follow up titer on labs.  5. Rh negative state in antepartum period Rhogam at 28 weeks.  6. Obesity in pregnancy, antepartum - Hemoglobin A1c -  Comprehensive metabolic panel - aspirin EC 81 MG tablet; Take 1 tablet (81 mg total) by mouth daily. Take after 12 weeks for prevention of preeclampsia later in pregnancy  Dispense: 300 tablet; Refill: 2 - [place] MFM OB COMP + 14 WK; Future  7. Encounter for fetal anatomic survey - [place] MFM OB COMP + 14 WK; Future  8. Supervision of high risk pregnancy - Obstetric Panel, Including HIV - Culture, OB Urine - Genetic Screening - [place] MFM OB COMP + 14 WK; Future Initial labs  drawn. Continue prenatal vitamins. Genetic Screening discussed, NIPS: ordered. Ultrasound discussed; fetal anatomic survey: ordered. Problem list reviewed and updated. The nature [facility] with multiple MDs and other Advanced Practice Providers was explained to patient; also emphasized that residents, students are part of our team. Explained to patient that the prenatal visit structure has changed in the wake of the COVID-19 pandemic and that a lot of visits will be virtual. She will need to do weekly BP monitoring and weight monitoring at home, and will come in as directed.  Tried to sign her up for [employer], she said she does have an email address.  Our staff will help her set up an email address to get signed up for this program, which will help a lot with her virtual visits. Routine obstetric precautions reviewed. Return in about 4 weeks (around 11/05/2018) for Virtual OB Visit.     Jaynie CollinsUGONNA  Katianne Barre, MD, FACOG Obstetrician & Gynecologist, [facility], [facility]

## 2018-10-09 LAB — COMPREHENSIVE METABOLIC PANEL
ALT: 15 IU/L (ref 0–32)
AST: 13 IU/L (ref 0–40)
Albumin/Globulin Ratio: 1.4 (ref 1.2–2.2)
Albumin: 4 g/dL (ref 3.8–4.8)
Alkaline Phosphatase: 83 IU/L (ref 39–117)
BUN/Creatinine Ratio: 11 (ref 9–23)
BUN: 7 mg/dL (ref 6–20)
Bilirubin Total: <0.2 mg/dL (ref 0.0–1.2)
CO2: 23 mmol/L (ref 20–29)
Calcium: 9.6 mg/dL (ref 8.7–10.2)
Chloride: 103 mmol/L (ref 96–106)
Creatinine, Ser: 0.64 mg/dL (ref 0.57–1.00)
GFR calc Af Amer: 136 mL/min/{1.73_m2} (ref >59)
GFR calc non Af Amer: 118 mL/min/{1.73_m2} (ref >59)
Globulin, Total: 2.9 g/dL (ref 1.5–4.5)
Glucose: 101 mg/dL — ABNORMAL HIGH (ref 65–99)
Potassium: 4.8 mmol/L (ref 3.5–5.2)
Sodium: 134 mmol/L (ref 134–144)
Total Protein: 6.9 g/dL (ref 6.0–8.5)

## 2018-10-09 LAB — OBSTETRIC PANEL, INCLUDING HIV
Antibody Screen: NEGATIVE
Basophils Absolute: 0.1 x10E3/uL (ref 0.0–0.2)
Basos: 1 %
EOS (ABSOLUTE): 0.1 x10E3/uL (ref 0.0–0.4)
Eos: 1 %
HIV Screen 4th Generation wRfx: NONREACTIVE
Hematocrit: 38.6 % (ref 34.0–46.6)
Hemoglobin: 13.3 g/dL (ref 11.1–15.9)
Hepatitis B Surface Ag: NEGATIVE
Immature Grans (Abs): 0 x10E3/uL (ref 0.0–0.1)
Immature Granulocytes: 0 %
Lymphocytes Absolute: 2.5 x10E3/uL (ref 0.7–3.1)
Lymphs: 31 %
MCH: 29.7 pg (ref 26.6–33.0)
MCHC: 34.5 g/dL (ref 31.5–35.7)
MCV: 86 fL (ref 79–97)
Monocytes Absolute: 0.5 x10E3/uL (ref 0.1–0.9)
Monocytes: 6 %
Neutrophils Absolute: 5 x10E3/uL (ref 1.4–7.0)
Neutrophils: 61 %
Platelets: 318 x10E3/uL (ref 150–450)
RBC: 4.48 x10E6/uL (ref 3.77–5.28)
RDW: 13.3 % (ref 11.7–15.4)
RPR Ser Ql: NONREACTIVE
Rh Factor: NEGATIVE
Rubella Antibodies, IGG: 2.19 {index} (ref >0.99)
WBC: 8.1 x10E3/uL (ref 3.4–10.8)

## 2018-10-09 LAB — CERVICOVAGINAL ANCILLARY ONLY
Bacterial vaginitis: POSITIVE — AB
Candida vaginitis: POSITIVE — AB
Chlamydia: POSITIVE — AB
Neisseria Gonorrhea: POSITIVE — AB
Trichomonas: NEGATIVE

## 2018-10-09 LAB — HEMOGLOBIN A1C
Est. average glucose Bld gHb Est-mCnc: 114 mg/dL
Hgb A1c MFr Bld: 5.6 % (ref 4.8–5.6)

## 2018-10-10 ENCOUNTER — Encounter: Payer: Self-pay | Admitting: Obstetrics & Gynecology

## 2018-10-10 DIAGNOSIS — A749 Chlamydial infection, unspecified: Secondary | ICD-10-CM

## 2018-10-10 DIAGNOSIS — O98811 Other maternal infectious and parasitic diseases complicating pregnancy, first trimester: Secondary | ICD-10-CM

## 2018-10-10 DIAGNOSIS — O98211 Gonorrhea complicating pregnancy, first trimester: Secondary | ICD-10-CM

## 2018-10-10 HISTORY — DX: Chlamydial infection, unspecified: O98.811

## 2018-10-10 HISTORY — DX: Gonorrhea complicating pregnancy, first trimester: O98.211

## 2018-10-10 HISTORY — DX: Chlamydial infection, unspecified: A74.9

## 2018-10-10 MED ORDER — AZITHROMYCIN 500 MG PO TABS: 1000.0000 mg | ORAL_TABLET | Freq: Once | ORAL | 1 refills | Status: AC

## 2018-10-10 MED ORDER — METRONIDAZOLE 500 MG PO TABS: 500.0000 mg | ORAL_TABLET | Freq: Two times a day (BID) | ORAL | 0 refills | Status: DC

## 2018-10-10 MED ORDER — CEFTRIAXONE SODIUM 250 MG IJ SOLR: 250.0000 mg | Freq: Once | INTRAMUSCULAR | Status: DC

## 2018-10-10 NOTE — Progress Notes (Signed)
Result Addendum  Cervicovaginal ancillary only( [place])     Status: Abnormal   Collection Time: 10/08/18 12:00 AM  Result Value Ref Range   Bacterial vaginitis **POSITIVE for Gardnerella vaginalis** (A)     Comment: Normal Reference Range - Negative   Candida vaginitis **POSITIVE for Candida species** (A)     Comment: Normal Reference Range - Negative   Chlamydia **POSITIVE** (A)     Comment: Normal Reference Range - Negative   Neisseria gonorrhea **POSITIVE** (A)     Comment: Normal Reference Range - Negative   Trichomonas Negative     Comment: Normal Reference Range - Negative  Obstetric Panel, Including HIV     Status: None   Collection Time: 10/08/18  9:11 AM  Result Value Ref Range   Hepatitis B Surface Ag Negative Negative   RPR Ser Ql Non Reactive Non Reactive   HIV Screen 4th Generation wRfx Non Reactive Non Reactive   Patient has gonorrhea and chlamydia in the first trimester of pregnancy.  Negative testing for other STIs; testing also showed bacterial and candidal vaginitis.  Patient needs to let partner(s) know so the partner(s) can get testing and treatment. Patient and sex partner(s) should abstain from unprotected sexual activity for seven days after everyone receives appropriate treatment.  Azithromycin was prescribed for patient for chlamydia; she needs to come in for Ceftriaxone injection in the office.  For her vaginitis, she had already been prescribed Terazol for candida; will prescribe Metronidazole for bacterial vaginitis.  Patient will need to return in about 4 weeks after treatment for repeat test of cure.  Please emphasize need for safe sex practices and consequences of STIs in pregnancy.  Please call to inform patient of results and recommendations, and advise to come in injection and pick up prescriptions and take as directed. Problem list and medical history updated.  Jaynie Collins, MD, FACOG Obstetrician & Gynecologist, [facility] for [employer], [facility]

## 2018-10-10 NOTE — Addendum Note (Signed)
Addended by: Jaynie Collins A on: 10/10/2018 10:35 AM   Modules accepted: Orders

## 2018-10-12 ENCOUNTER — Other Ambulatory Visit: Payer: Self-pay

## 2018-10-12 ENCOUNTER — Ambulatory Visit (INDEPENDENT_AMBULATORY_CARE_PROVIDER_SITE_OTHER): Payer: Medicaid Other

## 2018-10-12 VITALS — BP 122/78 | HR 80 | Wt 232.0 lb

## 2018-10-12 DIAGNOSIS — O98211 Gonorrhea complicating pregnancy, first trimester: Secondary | ICD-10-CM

## 2018-10-12 MED ORDER — CEFTRIAXONE SODIUM 250 MG IJ SOLR
250.0000 mg | Freq: Once | INTRAMUSCULAR | Status: AC
  Administered 2018-10-12: 250 mg via INTRAMUSCULAR

## 2018-10-12 NOTE — Progress Notes (Signed)
Presents for Rocephin Injection for +Gonorrhea. Given in RUOQ, tolerated well.  Administrations This Visit    cefTRIAXone (ROCEPHIN) injection 250 mg    Admin Date 10/12/2018 Action Given Dose 250 mg Route Intramuscular Administered By Maretta Bees, RMA

## 2018-10-13 LAB — URINE CULTURE, OB REFLEX

## 2018-10-13 LAB — CULTURE, OB URINE

## 2018-10-16 ENCOUNTER — Encounter: Payer: Self-pay | Admitting: Obstetrics & Gynecology

## 2018-10-18 ENCOUNTER — Encounter: Payer: Self-pay | Admitting: Obstetrics & Gynecology

## 2018-10-18 DIAGNOSIS — R8271 Bacteriuria: Secondary | ICD-10-CM | POA: Insufficient documentation

## 2018-10-18 HISTORY — DX: Bacteriuria: R82.71

## 2018-11-06 ENCOUNTER — Encounter: Payer: Self-pay | Admitting: Obstetrics

## 2018-11-06 ENCOUNTER — Ambulatory Visit (INDEPENDENT_AMBULATORY_CARE_PROVIDER_SITE_OTHER): Payer: Medicaid Other | Admitting: Obstetrics

## 2018-11-06 ENCOUNTER — Other Ambulatory Visit: Payer: Self-pay

## 2018-11-06 DIAGNOSIS — Z6791 Unspecified blood type, Rh negative: Secondary | ICD-10-CM

## 2018-11-06 DIAGNOSIS — O09892 Supervision of other high risk pregnancies, second trimester: Secondary | ICD-10-CM

## 2018-11-06 DIAGNOSIS — O0990 Supervision of high risk pregnancy, unspecified, unspecified trimester: Secondary | ICD-10-CM

## 2018-11-06 DIAGNOSIS — O98211 Gonorrhea complicating pregnancy, first trimester: Secondary | ICD-10-CM

## 2018-11-06 DIAGNOSIS — O26892 Other specified pregnancy related conditions, second trimester: Secondary | ICD-10-CM

## 2018-11-06 DIAGNOSIS — O99212 Obesity complicating pregnancy, second trimester: Secondary | ICD-10-CM

## 2018-11-06 DIAGNOSIS — O26899 Other specified pregnancy related conditions, unspecified trimester: Secondary | ICD-10-CM

## 2018-11-06 DIAGNOSIS — Z8619 Personal history of other infectious and parasitic diseases: Secondary | ICD-10-CM

## 2018-11-06 DIAGNOSIS — O98812 Other maternal infectious and parasitic diseases complicating pregnancy, second trimester: Secondary | ICD-10-CM

## 2018-11-06 DIAGNOSIS — O0992 Supervision of high risk pregnancy, unspecified, second trimester: Secondary | ICD-10-CM

## 2018-11-06 DIAGNOSIS — Z87898 Personal history of other specified conditions: Secondary | ICD-10-CM

## 2018-11-06 DIAGNOSIS — O98212 Gonorrhea complicating pregnancy, second trimester: Secondary | ICD-10-CM

## 2018-11-06 DIAGNOSIS — A749 Chlamydial infection, unspecified: Secondary | ICD-10-CM

## 2018-11-06 DIAGNOSIS — O09899 Supervision of other high risk pregnancies, unspecified trimester: Secondary | ICD-10-CM

## 2018-11-06 DIAGNOSIS — Z3A14 14 weeks gestation of pregnancy: Secondary | ICD-10-CM

## 2018-11-06 DIAGNOSIS — O99210 Obesity complicating pregnancy, unspecified trimester: Secondary | ICD-10-CM

## 2018-11-06 NOTE — Addendum Note (Signed)
Addended by: Brock Bad on: 11/06/2018 03:43 PM   Modules accepted: Level of Service

## 2018-11-06 NOTE — Progress Notes (Signed)
ROB/Webex.  C/o still having sx, she completed all her treatments for +GC. She wants to have TeleVisit because she is having Internet problems.

## 2018-11-06 NOTE — Progress Notes (Signed)
   TELEHEALTH VIRTUAL OBSTETRICS VISIT ENCOUNTER NOTE  I connected with [name] on 11/06/18 at  3:00 PM EDT by telephone at home and verified that I am speaking with the correct person using two identifiers.   I discussed the limitations, risks, security and privacy concerns of performing an evaluation and management service by telephone and the availability of in person appointments. I also discussed with the patient that there may be a patient responsible charge related to this service. The patient expressed understanding and agreed to proceed.  Subjective:  Diamond Burton is a 33 y.o. [id number] at 108w4d being followed for ongoing prenatal care.  She is currently monitored for the following issues for this high-risk pregnancy and has Hydradenitis; History of herpes genitalis; History of maternal syphilis, currently pregnant; Rh negative state in antepartum period; Obesity in pregnancy, antepartum; History of low birth weight infant; History of Erb's palsy; Homicidal ideation; Suicidal ideation; Supervision of high-risk pregnancy; Chlamydia infection affecting pregnancy in first trimester; Gonorrhea affecting pregnancy in first trimester; and Group B streptococcal bacteriuria on their problem list.  Patient reports vaginal irritation and lower abdominal pain. Reports fetal movement. Denies any contractions, bleeding or leaking of fluid.   The following portions of the patient's history were reviewed and updated as appropriate: allergies, current medications, past family history, past medical history, past social history, past surgical history and problem list.   Objective:   General:  Alert, oriented and cooperative.   Mental Status: Normal mood and affect perceived. Normal judgment and thought content.  Rest of physical exam deferred due to type of encounter  Assessment and Plan:  Pregnancy: G5P3013 at [redacted]w[redacted]d 1. Supervision of high risk pregnancy, antepartum  2. Gonorrhea affecting  pregnancy in first trimester, treated  3. Chlamydia infection affecting pregnancy in first trimester, treated  4. History of herpes genitalis - taking Valtrex suppression daily  5. History of maternal syphilis, currently pregnant  6. History of low birth weight infant  7. Obesity in pregnancy, antepartum  8. Rh negative state in antepartum period - Rhogam at 28 weeks   Preterm labor symptoms and general obstetric precautions including but not limited to vaginal bleeding, contractions, leaking of fluid and fetal movement were reviewed in detail with the patient.  I discussed the assessment and treatment plan with the patient. The patient was provided an opportunity to ask questions and all were answered. The patient agreed with the plan and demonstrated an understanding of the instructions. The patient was advised to call back or seek an in-person office evaluation/go to [facility] at [facility] for any urgent or concerning symptoms. Please refer to After Visit Summary for other counseling recommendations.   I provided 15 minutes of non-face-to-face time during this encounter.  Return in about 4 weeks (around 12/04/2018) for [facility]  Future Appointments  Date Time Provider Department Center  11/30/2018 10:45 AM [facility] [place] 2 [facility]  12/04/2018  3:15 PM Brock Bad, MD [facility] None    Coral Ceo, MD Center for [facility], [facility] 11-06-2018

## 2018-11-07 ENCOUNTER — Encounter (HOSPITAL_COMMUNITY): Payer: Self-pay | Admitting: *Deleted

## 2018-11-07 ENCOUNTER — Inpatient Hospital Stay (HOSPITAL_COMMUNITY)
Admission: AD | Admit: 2018-11-07 | Discharge: 2018-11-07 | Disposition: A | Discharge status: Home / Self Care | Payer: Medicaid Other | Attending: Obstetrics and Gynecology | Admitting: Obstetrics and Gynecology

## 2018-11-07 ENCOUNTER — Other Ambulatory Visit: Payer: Self-pay

## 2018-11-07 DIAGNOSIS — O26892 Other specified pregnancy related conditions, second trimester: Secondary | ICD-10-CM

## 2018-11-07 DIAGNOSIS — Z87891 Personal history of nicotine dependence: Secondary | ICD-10-CM | POA: Diagnosis not present

## 2018-11-07 DIAGNOSIS — Z3A14 14 weeks gestation of pregnancy: Secondary | ICD-10-CM

## 2018-11-07 DIAGNOSIS — R109 Unspecified abdominal pain: Secondary | ICD-10-CM | POA: Insufficient documentation

## 2018-11-07 DIAGNOSIS — O99342 Other mental disorders complicating pregnancy, second trimester: Secondary | ICD-10-CM | POA: Diagnosis not present

## 2018-11-07 DIAGNOSIS — O26899 Other specified pregnancy related conditions, unspecified trimester: Secondary | ICD-10-CM

## 2018-11-07 DIAGNOSIS — Z7982 Long term (current) use of aspirin: Secondary | ICD-10-CM | POA: Insufficient documentation

## 2018-11-07 DIAGNOSIS — N898 Other specified noninflammatory disorders of vagina: Secondary | ICD-10-CM

## 2018-11-07 DIAGNOSIS — Z87898 Personal history of other specified conditions: Secondary | ICD-10-CM

## 2018-11-07 DIAGNOSIS — R8271 Bacteriuria: Secondary | ICD-10-CM

## 2018-11-07 DIAGNOSIS — O0992 Supervision of high risk pregnancy, unspecified, second trimester: Secondary | ICD-10-CM

## 2018-11-07 DIAGNOSIS — F4310 Post-traumatic stress disorder, unspecified: Secondary | ICD-10-CM | POA: Diagnosis not present

## 2018-11-07 HISTORY — DX: Post-traumatic stress disorder, unspecified: F43.10

## 2018-11-07 HISTORY — DX: Bipolar disorder, unspecified: F31.9

## 2018-11-07 LAB — WET PREP, GENITAL
Clue Cells Wet Prep HPF POC: NONE SEEN
Sperm: NONE SEEN
Trich, Wet Prep: NONE SEEN
Yeast Wet Prep HPF POC: NONE SEEN

## 2018-11-07 LAB — URINALYSIS, ROUTINE W REFLEX MICROSCOPIC
Bilirubin Urine: NEGATIVE
Glucose, UA: NEGATIVE mg/dL
Hgb urine dipstick: NEGATIVE
Ketones, ur: 5 mg/dL — AB
Leukocytes,Ua: NEGATIVE
Nitrite: NEGATIVE
Protein, ur: NEGATIVE mg/dL
Specific Gravity, Urine: 1.026 (ref 1.005–1.030)
pH: 6 (ref 5.0–8.0)

## 2018-11-07 MED ORDER — ACETAMINOPHEN 500 MG PO TABS
1000.0000 mg | ORAL_TABLET | Freq: Once | ORAL | Status: AC
  Administered 2018-11-07: 1000 mg via ORAL
  Filled 2018-11-07: qty 2

## 2018-11-07 NOTE — Discharge Instructions (Signed)
.  Preeclampsia and Eclampsia    Preeclampsia is a serious condition that may develop during pregnancy. It is also called toxemia of pregnancy. This condition causes high blood pressure along with other symptoms, such as swelling and headaches. These symptoms may develop as the condition gets worse. Preeclampsia may occur at 20 weeks of pregnancy or later.  Diagnosing and treating preeclampsia early is very important. If not treated early, it can cause serious problems for you and your baby. One problem it can lead to is eclampsia. Eclampsia is a condition that causes muscle jerking or shaking (convulsions or seizures) and other serious problems for the mother. During pregnancy, delivering your baby may be the best treatment for preeclampsia or eclampsia. For most women, preeclampsia and eclampsia symptoms go away after giving birth.  In rare cases, a woman may develop preeclampsia after giving birth (postpartum preeclampsia). This usually occurs within 48 hours after childbirth but may occur up to 6 weeks after giving birth.  What are the causes?  The cause of preeclampsia is not known.  What increases the risk?  The following risk factors make you more likely to develop preeclampsia:   Being pregnant for the first time.   Having had preeclampsia during a past pregnancy.   Having a family history of preeclampsia.   Having high blood pressure.   Being pregnant with more than one baby.   Being 35 or older.   Being African-American.   Having kidney disease or diabetes.   Having medical conditions such as lupus or blood diseases.   Being very overweight (obese).  What are the signs or symptoms?  The earliest signs of preeclampsia are:   High blood pressure.   Increased protein in your urine. Your health care provider will check for this at every visit before you give birth (prenatal visit).  Other symptoms that may develop as the condition gets worse include:   Severe headaches.   Sudden weight  gain.   Swelling of the hands, face, legs, and feet.   Nausea and vomiting.   Vision problems, such as blurred or double vision.   Numbness in the face, arms, legs, and feet.   Urinating less than usual.   Dizziness.   Slurred speech.   Abdominal pain, especially upper abdominal pain.   Convulsions or seizures.  How is this diagnosed?  There are no screening tests for preeclampsia. Your health care provider will ask you about symptoms and check for signs of preeclampsia during your prenatal visits. You may also have tests that include:   Urine tests.   Blood tests.   Checking your blood pressure.   Monitoring your baby's heart rate.   Ultrasound.  How is this treated?  You and your health care provider will determine the treatment approach that is best for you. Treatment may include:   Having more frequent prenatal exams to check for signs of preeclampsia, if you have an increased risk for preeclampsia.   Medicine to lower your blood pressure.   Staying in the hospital, if your condition is severe. There, treatment will focus on controlling your blood pressure and the amount of fluids in your body (fluid retention).   Taking medicine (magnesium sulfate) to prevent seizures. This may be given as an injection or through an IV.   Taking a low-dose aspirin during your pregnancy.   Delivering your baby early, if your condition gets worse. You may have your labor started with medicine (induced), or you may have a cesarean   delivery.  Follow these instructions at home:  Eating and drinking     Drink enough fluid to keep your urine pale yellow.   Avoid caffeine.  Lifestyle   Do not use any products that contain nicotine or tobacco, such as cigarettes and e-cigarettes. If you need help quitting, ask your health care provider.   Do not use alcohol or drugs.   Avoid stress as much as possible. Rest and get plenty of sleep.  General instructions   Take over-the-counter and prescription medicines only as  told by your health care provider.   When lying down, lie on your left side. This keeps pressure off your major blood vessels.   When sitting or lying down, raise (elevate) your feet. Try putting some pillows underneath your lower legs.   Exercise regularly. Ask your health care provider what kinds of exercise are best for you.   Keep all follow-up and prenatal visits as told by your health care provider. This is important.  How is this prevented?  There is no known way of preventing preeclampsia or eclampsia from developing. However, to lower your risk of complications and detect problems early:   Get regular prenatal care. Your health care provider may be able to diagnose and treat the condition early.   Maintain a healthy weight. Ask your health care provider for help managing weight gain during pregnancy.   Work with your health care provider to manage any long-term (chronic) health conditions you have, such as diabetes or kidney problems.   You may have tests of your blood pressure and kidney function after giving birth.   Your health care provider may have you take low-dose aspirin during your next pregnancy.  Contact a health care provider if:   You have symptoms that your health care provider told you may require more treatment or monitoring, such as:  ? Headaches.  ? Nausea or vomiting.  ? Abdominal pain.  ? Dizziness.  ? Light-headedness.  Get help right away if:   You have severe:  ? Abdominal pain.  ? Headaches that do not get better.  ? Dizziness.  ? Vision problems.  ? Confusion.  ? Nausea or vomiting.   You have any of the following:  ? A seizure.  ? Sudden, rapid weight gain.  ? Sudden swelling in your hands, ankles, or face.  ? Trouble moving any part of your body.  ? Numbness in any part of your body.  ? Trouble speaking.  ? Abnormal bleeding.   You faint.  Summary   Preeclampsia is a serious condition that may develop during pregnancy. It is also called toxemia of pregnancy.   This  condition causes high blood pressure along with other symptoms, such as swelling and headaches.   Diagnosing and treating preeclampsia early is very important. If not treated early, it can cause serious problems for you and your baby.   Get help right away if you have symptoms that your health care provider told you to watch for.  This information is not intended to replace advice given to you by your health care provider. Make sure you discuss any questions you have with your health care provider.  Document Released: 05/27/2000 Document Revised: 05/16/2017 Document Reviewed: 01/04/2016  Elsevier Interactive Patient Education  2019 [employer]

## 2018-11-07 NOTE — MAU Note (Signed)
Presents with c/o lower abdominal cramping and vaginal discharge.  Denies vaginal odor or VB.

## 2018-11-07 NOTE — MAU Provider Note (Signed)
History     CSN: [id number]  Arrival date and time: 11/07/18 1202   First Provider Initiated Contact with Patient 11/07/18 1248      Chief Complaint  Patient presents with  . Abdominal Pain  . Vaginal Discharge   Diamond Burton is a 33 y.o. [id number] at 3060w5d who presents for Abdominal Pain and Vaginal Discharge.  She reports her symptoms started last Sunday.  She endorses having a GC/CT about 3 weeks ago as well as BV and yeast.  She further endorses completion of all medications and partner treatment.  She reports taking "Advil or aspirin" for pain, but states it is not working.  Patient rates pain as 8/10 and describes it as cramping in the lower abdominal, pelvic area.  Patient also reports some vaginal discharge that is white, odor-less, but causes itching.       OB History    Gravida  5   Para  3   Term  3   Preterm      AB  1   Living  3     SAB  1   TAB      Ectopic      Multiple  0   Live Births  3           Past Medical History:  Diagnosis Date  . Abnormal Pap smear   . Bipolar disorder (HCC)   . Chlamydia infection affecting pregnancy in first trimester 10/10/2018  . Depression    stopped meds, was wanting to see counselor  . Eczema   . Erb's palsy    L arm  . Erb's palsy    left arm  . Gonorrhea affecting pregnancy in first trimester 10/10/2018  . Herpes    frequent outbreaks on valtrex 500  . History of chlamydia   . 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 History of gonorrhea   . Hydradenitis   . Infection    UTI  . Kidney stones   . Ovarian cyst   . Periodontal disease   . PID (pelvic inflammatory disease)   . PTSD (post-traumatic stress disorder)   . Trichomonas contact     Past Surgical History:  Procedure Laterality Date  . NO PAST SURGERIES      Family History  Problem Relation Age of Onset  . Depression Mother   . Diabetes Mother   . Hypertension Mother   . Heart disease Mother   . Asthma Sister   . Asthma  Daughter   . Hypertension Maternal Grandmother   . Diabetes Maternal Grandmother     Social History   Tobacco Use  . Smoking status: Former Smoker    Packs/day: 0.50    Years: 2.00    Pack years: 1.00    Types: Cigarettes  . Smokeless tobacco: Never Used  Substance Use Topics  . Alcohol use: No    Comment: occ  . Drug use: No    Allergies:  Allergies  Allergen Reactions  . Latex     Facility-Administered Medications Prior to Admission  Medication Dose Route Frequency Provider Last Rate Last Dose  . cefTRIAXone (ROCEPHIN) injection 250 mg  250 mg Intramuscular Once Anyanwu, Ugonna A, MD       Medications Prior to Admission  Medication Sig Dispense Refill Last Dose  . aspirin EC 81 MG tablet Take 1 tablet (81 mg total) by mouth daily. Take after 12 weeks for prevention of preeclampsia later in pregnancy 300 tablet 2  Past Week at Unknown time  . Prenat w/o A-FeCbn-Meth-FA-DHA (PRENATE MINI) 29-0.6-0.4-350 MG CAPS Take 1 capsule by mouth daily before breakfast. 30 capsule 5 11/07/2018 at 0800  . valACYclovir (VALTREX) 1000 MG tablet Take 1,000 mg by mouth daily.   11/07/2018 at 0800  . metroNIDAZOLE (FLAGYL) 500 MG tablet Take 1 tablet (500 mg total) by mouth 2 (two) times daily. (Patient not taking: Reported on 11/06/2018) 14 tablet 0 Not Taking  . terconazole (TERAZOL 3) 0.8 % vaginal cream Place 1 applicator vaginally at bedtime. Apply nightly for three nights. 20 g 0     Review of Systems  Constitutional: Negative for chills and fever.  Respiratory: Negative for cough and shortness of breath.   Gastrointestinal: Positive for abdominal pain (Cramping). Negative for constipation, diarrhea, nausea and vomiting.  Genitourinary: Positive for pelvic pain (cramping) and vaginal discharge (White, No smell, +itching, -burning. ). Negative for dysuria.  Neurological: Negative for dizziness, light-headedness and headaches.   Physical Exam   Blood pressure 124/61, pulse 79,  temperature 98.2 F (36.8 C), temperature source Oral, resp. rate 20, height  (1.651 m), weight 105.4 kg, SpO2 99 %, unknown if currently breastfeeding.  Physical Exam  Constitutional: She is oriented to person, place, and time. She appears well-developed and well-nourished.  HENT:  Head: Normocephalic and atraumatic.  Eyes: Conjunctivae are normal.  Neck: Normal range of motion.  Cardiovascular: Normal rate.  Respiratory: Effort normal.  GI: Soft.  Genitourinary: Cervix exhibits discharge. Cervix exhibits no motion tenderness and no friability.    Vaginal discharge present.     Genitourinary Comments: Speculum Exam: -Vaginal Vault: Pink mucosa.  Scant amt  -wet prep collected -Cervix:Pink, no lesions, cysts, or polyps.  Appears closed. No active bleeding, but thick yellow mucoid discharge noted from os-GC/CT collected -Bimanual Exam: Closed/Long   Musculoskeletal: Normal range of motion.  Neurological: She is alert and oriented to person, place, and time.  Skin: Skin is warm and dry.  Psychiatric: She has a normal mood and affect. Her behavior is normal.    MAU Course  Procedures Results for orders placed or performed during the hospital encounter of 11/07/18 (from the past 24 hour(s))  Urinalysis, Routine w reflex microscopic     Status: Abnormal   Collection Time: 11/07/18  1:10 PM  Result Value Ref Range   Color, Urine YELLOW YELLOW   APPearance CLEAR CLEAR   Specific Gravity, Urine 1.026 1.005 - 1.030   pH 6.0 5.0 - 8.0   Glucose, UA NEGATIVE NEGATIVE mg/dL   Hgb urine dipstick NEGATIVE NEGATIVE   Bilirubin Urine NEGATIVE NEGATIVE   Ketones, ur 5 (A) NEGATIVE mg/dL   Protein, ur NEGATIVE NEGATIVE mg/dL   Nitrite NEGATIVE NEGATIVE   Leukocytes,Ua NEGATIVE NEGATIVE  Wet prep, genital     Status: Abnormal   Collection Time: 11/07/18  1:24 PM  Result Value Ref Range   Yeast Wet Prep HPF POC NONE SEEN NONE SEEN   Trich, Wet Prep NONE SEEN NONE SEEN   Clue Cells Wet  Prep HPF POC NONE SEEN NONE SEEN   WBC, Wet Prep HPF POC MODERATE (A) NONE SEEN   Sperm NONE SEEN     MDM Pelvic Exam Cultures: Wet prep, GC/CT   Doppler 159 by Nurse Assessment and Plan  33 year old [id number] SIUP at 14.5 weeks Abdominal Pain Vaginal Discharge  -Discussed daily supplementation of aspirin for PreEclampsia risk and not for pain management.  Extensive discussion about risk factors and how daily  baby aspirin dosing contributes to reducing PreE.  -Exam findings discussed. -Patient informed that no apparent vaginal discharge noted. -Offered and accepts pain medication. -Tylenol XR given -Will await results.    Follow Up (2:04 PM) Leukorrhea  -Wet prep returns with insignificant findings. -Results discussed with patient. -Informed that GC/CT will return within 2-3 days. -Patient reports FOB is having sex with other women. Provider encouraged her to use condoms with FOB. -Reports improvement of pain with tylenol dosing.  -Information given regarding PreEclampsia and patient told that this was for educational purposes only and she does not currently have s/s of this diagnosis.  -Patient verbalizes understanding and has no other questions or concerns. -Encouraged to call or return to MAU if symptoms worsen or with the onset of new symptoms. -Discharged to home in stable condition.   Cherre Robins MSN, CNM 11/07/2018, 12:48 PM

## 2018-11-08 LAB — GC/CHLAMYDIA PROBE AMP (~~LOC~~) NOT AT ARMC
Chlamydia: NEGATIVE
Neisseria Gonorrhea: NEGATIVE

## 2018-11-21 ENCOUNTER — Encounter (HOSPITAL_COMMUNITY): Payer: Self-pay | Admitting: *Deleted

## 2018-11-21 ENCOUNTER — Other Ambulatory Visit: Payer: Self-pay

## 2018-11-21 ENCOUNTER — Inpatient Hospital Stay (HOSPITAL_COMMUNITY)
Admission: AD | Admit: 2018-11-21 | Discharge: 2018-11-21 | Disposition: A | Discharge status: Home / Self Care | Payer: Medicaid Other | Attending: Obstetrics and Gynecology | Admitting: Obstetrics and Gynecology

## 2018-11-21 DIAGNOSIS — O23592 Infection of other part of genital tract in pregnancy, second trimester: Secondary | ICD-10-CM | POA: Insufficient documentation

## 2018-11-21 DIAGNOSIS — O26892 Other specified pregnancy related conditions, second trimester: Secondary | ICD-10-CM

## 2018-11-21 DIAGNOSIS — R1012 Left upper quadrant pain: Secondary | ICD-10-CM

## 2018-11-21 DIAGNOSIS — K5900 Constipation, unspecified: Secondary | ICD-10-CM | POA: Insufficient documentation

## 2018-11-21 DIAGNOSIS — L732 Hidradenitis suppurativa: Secondary | ICD-10-CM | POA: Insufficient documentation

## 2018-11-21 DIAGNOSIS — Z3A16 16 weeks gestation of pregnancy: Secondary | ICD-10-CM

## 2018-11-21 DIAGNOSIS — Z87891 Personal history of nicotine dependence: Secondary | ICD-10-CM | POA: Insufficient documentation

## 2018-11-21 DIAGNOSIS — L03818 Cellulitis of other sites: Secondary | ICD-10-CM

## 2018-11-21 LAB — COMPREHENSIVE METABOLIC PANEL
ALT: 16 U/L (ref 0–44)
AST: 14 U/L — ABNORMAL LOW (ref 15–41)
Albumin: 2.7 g/dL — ABNORMAL LOW (ref 3.5–5.0)
Alkaline Phosphatase: 83 U/L (ref 38–126)
Anion gap: 8 (ref 5–15)
BUN: 5 mg/dL — ABNORMAL LOW (ref 6–20)
CO2: 22 mmol/L (ref 22–32)
Calcium: 9 mg/dL (ref 8.9–10.3)
Chloride: 105 mmol/L (ref 98–111)
Creatinine, Ser: 0.55 mg/dL (ref 0.44–1.00)
GFR calc Af Amer: >60 mL/min (ref >60)
GFR calc non Af Amer: >60 mL/min (ref >60)
Glucose, Bld: 113 mg/dL — ABNORMAL HIGH (ref 70–99)
Potassium: 3.4 mmol/L — ABNORMAL LOW (ref 3.5–5.1)
Sodium: 135 mmol/L (ref 135–145)
Total Bilirubin: <0.1 mg/dL — ABNORMAL LOW (ref 0.3–1.2)
Total Protein: 6.7 g/dL (ref 6.5–8.1)

## 2018-11-21 LAB — AMYLASE: Amylase: 41 U/L (ref 28–100)

## 2018-11-21 LAB — CBC WITH DIFFERENTIAL/PLATELET
Abs Immature Granulocytes: 0.06 10*3/uL (ref 0.00–0.07)
Basophils Absolute: 0.1 10*3/uL (ref 0.0–0.1)
Basophils Relative: 1 %
Eosinophils Absolute: 0.1 10*3/uL (ref 0.0–0.5)
Eosinophils Relative: 1 %
HCT: 36.3 % (ref 36.0–46.0)
Hemoglobin: 12.4 g/dL (ref 12.0–15.0)
Immature Granulocytes: 1 %
Lymphocytes Relative: 20 %
Lymphs Abs: 2.5 10*3/uL (ref 0.7–4.0)
MCH: 29.7 pg (ref 26.0–34.0)
MCHC: 34.2 g/dL (ref 30.0–36.0)
MCV: 86.8 fL (ref 80.0–100.0)
Monocytes Absolute: 0.8 10*3/uL (ref 0.1–1.0)
Monocytes Relative: 7 %
Neutro Abs: 9 10*3/uL — ABNORMAL HIGH (ref 1.7–7.7)
Neutrophils Relative %: 70 %
Platelets: 303 10*3/uL (ref 150–400)
RBC: 4.18 MIL/uL (ref 3.87–5.11)
RDW: 13.2 % (ref 11.5–15.5)
WBC: 12.6 10*3/uL — ABNORMAL HIGH (ref 4.0–10.5)
nRBC: 0 % (ref 0.0–0.2)

## 2018-11-21 LAB — URINALYSIS, ROUTINE W REFLEX MICROSCOPIC
Bilirubin Urine: NEGATIVE
Glucose, UA: NEGATIVE mg/dL
Hgb urine dipstick: NEGATIVE
Ketones, ur: NEGATIVE mg/dL
Nitrite: NEGATIVE
Protein, ur: 30 mg/dL — AB
Specific Gravity, Urine: 1.028 (ref 1.005–1.030)
pH: 6 (ref 5.0–8.0)

## 2018-11-21 LAB — LIPASE, BLOOD: Lipase: 35 U/L (ref 11–51)

## 2018-11-21 MED ORDER — POLYETHYLENE GLYCOL 3350 17 G PO PACK: 17.0000 g | PACK | Freq: Every day | ORAL | 3 refills | Status: DC

## 2018-11-21 MED ORDER — HYOSCYAMINE SULFATE 0.125 MG SL SUBL
0.1250 mg | SUBLINGUAL_TABLET | Freq: Once | SUBLINGUAL | Status: AC
  Administered 2018-11-21: 02:00:00 0.125 mg via SUBLINGUAL
  Filled 2018-11-21: qty 1

## 2018-11-21 MED ORDER — SIMETHICONE 80 MG PO CHEW: 80.0000 mg | CHEWABLE_TABLET | Freq: Four times a day (QID) | ORAL | 0 refills | Status: DC | PRN

## 2018-11-21 MED ORDER — CLINDAMYCIN PHOSPHATE 1 % EX GEL: Freq: Two times a day (BID) | CUTANEOUS | 0 refills | Status: DC

## 2018-11-21 MED ORDER — SIMETHICONE 80 MG PO CHEW
80.0000 mg | CHEWABLE_TABLET | Freq: Once | ORAL | Status: AC
  Administered 2018-11-21: 80 mg via ORAL
  Filled 2018-11-21: qty 1

## 2018-11-21 NOTE — Discharge Instructions (Signed)
Abdominal Pain During Pregnancy  Abdominal pain is common during pregnancy, and has many possible causes. Some causes are more serious than others, and sometimes the cause is not known. Abdominal pain can be a sign that labor is starting. It can also be caused by normal growth and stretching of muscles and ligaments during pregnancy. Always tell your health care provider if you have any abdominal pain. Follow these instructions at home:  Do not have sex or put anything in your vagina until your pain goes away completely.  Get plenty of rest until your pain improves.  Drink enough fluid to keep your urine pale yellow.  Take over-the-counter and prescription medicines only as told by your health care provider.  Keep all follow-up visits as told by your health care provider. This is important. Contact a health care provider if:  Your pain continues or gets worse after resting.  You have lower abdominal pain that: ? Comes and goes at regular intervals. ? Spreads to your back. ? Is similar to menstrual cramps.  You have pain or burning when you urinate. Get help right away if:  You have a fever or chills.  You have vaginal bleeding.  You are leaking fluid from your vagina.  You are passing tissue from your vagina.  You have vomiting or diarrhea that lasts for more than 24 hours.  Your baby is moving less than usual.  You feel very weak or faint.  You have shortness of breath.  You develop severe pain in your upper abdomen. Summary  Abdominal pain is common during pregnancy, and has many possible causes.  If you experience abdominal pain during pregnancy, tell your health care provider right away.  Follow your health care provider's home care instructions and keep all follow-up visits as directed. This information is not intended to replace advice given to you by your health care provider. Make sure you discuss any questions you have with your health care  provider. Document Released: 05/30/2005 Document Revised: 09/01/2016 Document Reviewed: 09/01/2016 Elsevier Interactive Patient Education  2019 [employer] Cellulitis, Adult  Cellulitis is a skin infection. The infected area is usually warm, red, swollen, and tender. This condition occurs most often in the arms and lower legs. The infection can travel to the muscles, blood, and underlying tissue and become serious. It is very important to get treated for this condition. What are the causes? Cellulitis is caused by bacteria. The bacteria enter through a break in the skin, such as a cut, burn, insect bite, open sore, or crack. What increases the risk? This condition is more likely to occur in people who:  Have a weak body defense system (immune system).  Have open wounds on the skin, such as cuts, burns, bites, and scrapes. Bacteria can enter the body through these open wounds.  Are older than 34 years of age.  Have diabetes.  Have a type of long-lasting (chronic) liver disease (cirrhosis) or kidney disease.  Are obese.  Have a skin condition such as: ? Itchy rash (eczema). ? Slow movement of blood in the veins (venous stasis). ? Fluid buildup below the skin (edema).  Have had radiation therapy.  Use IV drugs. What are the signs or symptoms? Symptoms of this condition include:  Redness, streaking, or spotting on the skin.  Swollen area of the skin.  Tenderness or pain when an area of the skin is touched.  Warm skin.  A fever.  Chills.  Blisters. How is this diagnosed? This condition  is diagnosed based on a medical history and physical exam. You may also have tests, including:  Blood tests.  Imaging tests. How is this treated? Treatment for this condition may include:  Medicines, such as antibiotic medicines or medicines to treat allergies (antihistamines).  Supportive care, such as rest and application of cold or warm cloths (compresses) to the  skin.  Hospital care, if the condition is severe. The infection usually starts to get better within 1-2 days of treatment. Follow these instructions at home:  Medicines  Take over-the-counter and prescription medicines only as told by your health care provider.  If you were prescribed an antibiotic medicine, take it as told by your health care provider. Do not stop taking the antibiotic even if you start to feel better. General instructions  Drink enough fluid to keep your urine pale yellow.  Do not touch or rub the infected area.  Raise (elevate) the infected area above the level of your heart while you are sitting or lying down.  Apply warm or cold compresses to the affected area as told by your health care provider.  Keep all follow-up visits as told by your health care provider. This is important. These visits let your health care provider make sure a more serious infection is not developing. Contact a health care provider if:  You have a fever.  Your symptoms do not begin to improve within 1-2 days of starting treatment.  Your bone or joint underneath the infected area becomes painful after the skin has healed.  Your infection returns in the same area or another area.  You notice a swollen bump in the infected area.  You develop new symptoms.  You have a general ill feeling (malaise) with muscle aches and pains. Get help right away if:  Your symptoms get worse.  You feel very sleepy.  You develop vomiting or diarrhea that persists.  You notice red streaks coming from the infected area.  Your red area gets larger or turns dark in color. These symptoms may represent a serious problem that is an emergency. Do not wait to see if the symptoms will go away. Get medical help right away. Call your local emergency services (911 in the [place] Do not drive yourself to the hospital. Summary  Cellulitis is a skin infection. This condition occurs most often in the arms and  lower legs.  Treatment for this condition may include medicines, such as antibiotic medicines or antihistamines.  Take over-the-counter and prescription medicines only as told by your health care provider. If you were prescribed an antibiotic medicine, do not stop taking the antibiotic even if you start to feel better.  Contact a health care provider if your symptoms do not begin to improve within 1-2 days of starting treatment or your symptoms get worse.  Keep all follow-up visits as told by your health care provider. This is important. These visits let your health care provider make sure that a more serious infection is not developing. This information is not intended to replace advice given to you by your health care provider. Make sure you discuss any questions you have with your health care provider. Document Released: 03/09/2005 Document Revised: 10/19/2017 Document Reviewed: 10/19/2017 Elsevier Interactive Patient Education  2019 [employer] Constipation, Adult Constipation is when a person has fewer bowel movements in a week than normal, has difficulty having a bowel movement, or has stools that are dry, hard, or larger than normal. Constipation may be caused by an underlying condition. It  may become worse with age if a person takes certain medicines and does not take in enough fluids. Follow these instructions at home: Eating and drinking   Eat foods that have a lot of fiber, such as fresh fruits and vegetables, whole grains, and beans.  Limit foods that are high in fat, low in fiber, or overly processed, such as french fries, hamburgers, cookies, candies, and soda.  Drink enough fluid to keep your urine clear or pale yellow. General instructions  Exercise regularly or as told by your health care provider.  Go to the restroom when you have the urge to go. Do not hold it in.  Take over-the-counter and prescription medicines only as told by your health care provider. These  include any fiber supplements.  Practice pelvic floor retraining exercises, such as deep breathing while relaxing the lower abdomen and pelvic floor relaxation during bowel movements.  Watch your condition for any changes.  Keep all follow-up visits as told by your health care provider. This is important. Contact a health care provider if:  You have pain that gets worse.  You have a fever.  You do not have a bowel movement after 4 days.  You vomit.  You are not hungry.  You lose weight.  You are bleeding from the anus.  You have thin, pencil-like stools. Get help right away if:  You have a fever and your symptoms suddenly get worse.  You leak stool or have blood in your stool.  Your abdomen is bloated.  You have severe pain in your abdomen.  You feel dizzy or you faint. This information is not intended to replace advice given to you by your health care provider. Make sure you discuss any questions you have with your health care provider. Document Released: 02/26/2004 Document Revised: 12/18/2015 Document Reviewed: 11/18/2015 Elsevier Interactive Patient Education  2019 [employer]  You have constipation which is hard stools that are difficult to pass. It is important to have regular bowel movements every 1-3 days that are soft and easy to pass. Hard stools increase your risk of hemorrhoids and are very uncomfortable.   To prevent constipation you can increase the amount of fiber in your diet. Examples of foods with fiber are leafy greens, whole grain breads, oatmeal and other grains.  It is also important to drink at least eight 8oz glass of water everyday.   If you have not has a bowel movement in 4-5 days you made need to clean out your bowel.  This will have establish normal movement through your bowel.    Miralax Clean out  Take 8 capfuls of miralax in 64 oz of gatorade. You can use any fluid that appeals to you (gatorade, water, juice)  Continue to drink at  least eight 8 oz glasses of water throughout the day  You can repeat with another 8 capfuls of miralax in 64 oz of gatorade if you are not having a large amount of stools  You will need to be at home and close to a bathroom for about 8 hours when you do the above as you may need to go to the bathroom frequently.   After you are cleaned out: - Start Colace100mg  twice daily - Start Miralax once daily - Start a daily fiber supplement like metamucil or citrucel - You can safely use enemas in pregnancy  - if you are having diarrhea you can reduce to Colace once a day or miralax every other day or a 1/2 capful  daily.

## 2018-11-21 NOTE — MAU Provider Note (Signed)
Chief Complaint:  Abdominal Pain   First Provider Initiated Contact with Patient 11/21/18 0116      HPI: Diamond Burton is a 33 y.o. [id number] at 46w5dwho presents to maternity admissions reporting sharp pain in Left upper abdomen since this evening.  Also has boils which are painful on her right labia/perineum. Marland KitchenHas been constipated.  Has been several days since she had a bowel movement.  Has not tried anything for it.   Sees a Dermatologist for chronic long-term hydradenitis, but Derm MD told her she could not treat her while pregnant.  Pt states she used to Inject something that made it better  She reports good fetal movement, denies LOF, vaginal bleeding, urinary symptoms, h/a, dizziness, n/v, diarrhea, constipation or fever/chills.    RN Note: PT SAYS SHE HAS STABBING PAIN  IN HER LEFT SIDE OF  ABD - .   ALSO HAS BOILS ON BODY.    DENIES VAG BLEEDING. [place]  -  WITH [facility]   Past Medical History: Past Medical History:  Diagnosis Date  . Abnormal Pap smear   . Bipolar disorder ([place])   . Chlamydia infection affecting pregnancy in first trimester 10/10/2018  . Depression    stopped meds, was wanting to see counselor  . Eczema   . Erb's palsy    L arm  . Erb's palsy    left arm  . Gonorrhea affecting pregnancy in first trimester 10/10/2018  . Herpes    frequent outbreaks on valtrex 500  . History of chlamydia   . 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 History of gonorrhea   . Hydradenitis   . Infection    UTI  . Kidney stones   . Ovarian cyst   . Periodontal disease   . PID (pelvic inflammatory disease)   . PTSD (post-traumatic stress disorder)   . Trichomonas contact     Past obstetric history: OB History  Gravida Para Term Preterm AB Living  5 3 3   1 3   SAB TAB Ectopic Multiple Live Births  1     0 3    # Outcome Date GA Lbr Len/2nd Weight Sex Delivery Anes PTL Lv  5 Current           4 SAB 07/2018          3 Term 11/29/16 [redacted]w[redacted]d 09:26 / 00:25 2580 g M  Vag-Vacuum EPI  LIV  2 Term 06/28/11 [redacted]w[redacted]d 12:32 / 00:21 3205 g F Vag-Spont EPI  LIV     Birth Comments: WNL  1 Term 2005 [redacted]w[redacted]d  2948 g F Vag-Spont   LIV    Past Surgical History: Past Surgical History:  Procedure Laterality Date  . NO PAST SURGERIES      Family History: Family History  Problem Relation Age of Onset  . Depression Mother   . Diabetes Mother   . Hypertension Mother   . Heart disease Mother   . Asthma Sister   . Asthma Daughter   . Hypertension Maternal Grandmother   . Diabetes Maternal Grandmother     Social History: Social History   Tobacco Use  . Smoking status: Former Smoker    Packs/day: 0.50    Years: 2.00    Pack years: 1.00    Types: Cigarettes  . Smokeless tobacco: Never Used  Substance Use Topics  . Alcohol use: No    Comment: occ  . Drug use: No    Allergies:  Allergies  Allergen Reactions  .  Latex     Meds:  No medications prior to admission.    I have reviewed patient's Past Medical Hx, Surgical Hx, Family Hx, Social Hx, medications and allergies.   ROS:  Review of Systems  Constitutional: Negative for chills and fever.  Respiratory: Negative for shortness of breath.   Gastrointestinal: Positive for abdominal pain. Negative for constipation, diarrhea and nausea.  Genitourinary: Positive for vaginal pain. Negative for frequency and vaginal bleeding.  Musculoskeletal: Negative for back pain.   Other systems negative  Physical Exam   Patient Vitals for the past 24 hrs:  BP Temp Temp src Pulse Resp Height Weight  11/21/18 0319 127/63 - - 86 - - -  11/21/18 0045 137/63 98.1 F (36.7 C) Oral 96 (!) 22 5\' 6"  (1.676 m) 105.2 kg   Constitutional: Well-developed, well-nourished female in no acute distress.  Cardiovascular: normal rate and rhythm Respiratory: normal effort, clear to auscultation bilaterally GI: Abd soft, tender over Left upper quadrant.   Gravid appropriate for gestational age.   No rebound or guarding. MS:  Extremities nontender, no edema, normal ROM Neurologic: Alert and oriented x 4.  GU: Neg CVAT.  PELVIC EXAM:  There are multiple areas of scarring over entire perineum and labia.  There are several small marks of old abscesses on right labia with a long area of induration and tenderness. No fluctuance. No pointing.    FHT:  164   Labs: Results for orders placed or performed during the hospital encounter of 11/21/18 (from the past 24 hour(s))  Urinalysis, Routine w reflex microscopic     Status: Abnormal   Collection Time: 11/21/18  1:13 AM  Result Value Ref Range   Color, Urine YELLOW YELLOW   APPearance CLEAR CLEAR   Specific Gravity, Urine 1.028 1.005 - 1.030   pH 6.0 5.0 - 8.0   Glucose, UA NEGATIVE NEGATIVE mg/dL   Hgb urine dipstick NEGATIVE NEGATIVE   Bilirubin Urine NEGATIVE NEGATIVE   Ketones, ur NEGATIVE NEGATIVE mg/dL   Protein, ur 30 (A) NEGATIVE mg/dL   Nitrite NEGATIVE NEGATIVE   Leukocytes,Ua TRACE (A) NEGATIVE   RBC / HPF 0-5 0 - 5 RBC/hpf   WBC, UA 0-5 0 - 5 WBC/hpf   Bacteria, UA RARE (A) NONE SEEN   Squamous Epithelial / LPF 0-5 0 - 5   Mucus PRESENT    Ca Oxalate Crys, UA PRESENT   CBC with Differential/Platelet     Status: Abnormal   Collection Time: 11/21/18  1:34 AM  Result Value Ref Range   WBC 12.6 (H) 4.0 - 10.5 K/uL   RBC 4.18 3.87 - 5.11 MIL/uL   Hemoglobin 12.4 12.0 - 15.0 g/dL   HCT 16.136.3 09.636.0 - 04.546.0 %   MCV 86.8 80.0 - 100.0 fL   MCH 29.7 26.0 - 34.0 pg   MCHC 34.2 30.0 - 36.0 g/dL   RDW 40.913.2 81.111.5 - 91.415.5 %   Platelets 303 150 - 400 K/uL   nRBC 0.0 0.0 - 0.2 %   Neutrophils Relative % 70 %   Neutro Abs 9.0 (H) 1.7 - 7.7 K/uL   Lymphocytes Relative 20 %   Lymphs Abs 2.5 0.7 - 4.0 K/uL   Monocytes Relative 7 %   Monocytes Absolute 0.8 0.1 - 1.0 K/uL   Eosinophils Relative 1 %   Eosinophils Absolute 0.1 0.0 - 0.5 K/uL   Basophils Relative 1 %   Basophils Absolute 0.1 0.0 - 0.1 K/uL   Immature Granulocytes 1 %  Abs Immature Granulocytes  0.06 0.00 - 0.07 K/uL  Comprehensive metabolic panel     Status: Abnormal   Collection Time: 11/21/18  1:34 AM  Result Value Ref Range   Sodium 135 135 - 145 mmol/L   Potassium 3.4 (L) 3.5 - 5.1 mmol/L   Chloride 105 98 - 111 mmol/L   CO2 22 22 - 32 mmol/L   Glucose, Bld 113 (H) 70 - 99 mg/dL   BUN 5 (L) 6 - 20 mg/dL   Creatinine, Ser 1.610.55 0.44 - 1.00 mg/dL   Calcium 9.0 8.9 - 09.610.3 mg/dL   Total Protein 6.7 6.5 - 8.1 g/dL   Albumin 2.7 (L) 3.5 - 5.0 g/dL   AST 14 (L) 15 - 41 U/L   ALT 16 0 - 44 U/L   Alkaline Phosphatase 83 38 - 126 U/L   Total Bilirubin <0.1 (L) 0.3 - 1.2 mg/dL   GFR calc non Af Amer >60 >60 mL/min   GFR calc Af Amer >60 >60 mL/min   Anion gap 8 5 - 15  Amylase     Status: None   Collection Time: 11/21/18  1:34 AM  Result Value Ref Range   Amylase 41 28 - 100 U/L  Lipase, blood     Status: None   Collection Time: 11/21/18  1:34 AM  Result Value Ref Range   Lipase 35 11 - 51 U/L   B/Negative/-- (04/27 0911)  Imaging:  No results found.  MAU Course/MDM: I have ordered labs and reviewed results.   Labs are normal  RN placed a warm pack on LUQ and it gave relief I ordered Levsin and Mylicon which afforded some relief also Discussed this pain is likely due to constipation Rx Miralax with Dr Newtons regime for Miralax Clean-out  Discussed we cannot open area on labia Discussed Epsom salt soaks  Rx Clindamycin gel for BID topical use for 7-10 days.   Treatments in MAU included Warm packs, Levsin, Mylicon.    Assessment: 1. Cellulitis of other specified site   2. Hydradenitis   3. Constipation, unspecified constipation type   4. LUQ pain     Plan: Discharge home Discussed this pain is likely due to constipation Rx Miralax with Dr Newtons regime for Miralax Clean-out  Discussed we cannot open area on labia Discussed Epsom salt soaks  Rx Clindamycin gel for BID topical use for 7-10 days.  Follow up in Office for prenatal visits and  recheck  Follow-up Information    [facility].   Specialty:  Obstetrics and Gynecology Contact information: [address], [address] [place] [id number] [phone]          Pt stable at time of discharge.  Wynelle BourgeoisMarie Kamaryn Grimley CNM, MSN Certified Nurse-Midwife 11/21/2018 5:26 AM

## 2018-11-21 NOTE — MAU Note (Signed)
PT SAYS SHE HAS STABBING PAIN  IN HER LEFT SIDE OF  ABD - .   ALSO HAS BOILS ON BODY.    DENIES VAG BLEEDING. [place]  -  [place]

## 2018-11-30 ENCOUNTER — Ambulatory Visit (HOSPITAL_COMMUNITY): Payer: Medicaid Other | Attending: Obstetrics and Gynecology

## 2018-12-04 ENCOUNTER — Other Ambulatory Visit: Payer: Self-pay

## 2018-12-04 ENCOUNTER — Ambulatory Visit (INDEPENDENT_AMBULATORY_CARE_PROVIDER_SITE_OTHER): Payer: Medicaid Other | Admitting: Obstetrics

## 2018-12-04 ENCOUNTER — Encounter: Payer: Self-pay | Admitting: Obstetrics

## 2018-12-04 DIAGNOSIS — Z6791 Unspecified blood type, Rh negative: Secondary | ICD-10-CM

## 2018-12-04 DIAGNOSIS — O99212 Obesity complicating pregnancy, second trimester: Secondary | ICD-10-CM

## 2018-12-04 DIAGNOSIS — O26899 Other specified pregnancy related conditions, unspecified trimester: Secondary | ICD-10-CM

## 2018-12-04 DIAGNOSIS — O0992 Supervision of high risk pregnancy, unspecified, second trimester: Secondary | ICD-10-CM

## 2018-12-04 DIAGNOSIS — R8271 Bacteriuria: Secondary | ICD-10-CM

## 2018-12-04 DIAGNOSIS — Z87898 Personal history of other specified conditions: Secondary | ICD-10-CM

## 2018-12-04 DIAGNOSIS — Z3A18 18 weeks gestation of pregnancy: Secondary | ICD-10-CM

## 2018-12-04 DIAGNOSIS — O98812 Other maternal infectious and parasitic diseases complicating pregnancy, second trimester: Secondary | ICD-10-CM

## 2018-12-04 DIAGNOSIS — O09899 Supervision of other high risk pregnancies, unspecified trimester: Secondary | ICD-10-CM

## 2018-12-04 DIAGNOSIS — O360120 Maternal care for anti-D [Rh] antibodies, second trimester, not applicable or unspecified: Secondary | ICD-10-CM

## 2018-12-04 DIAGNOSIS — O0990 Supervision of high risk pregnancy, unspecified, unspecified trimester: Secondary | ICD-10-CM

## 2018-12-04 DIAGNOSIS — O99210 Obesity complicating pregnancy, unspecified trimester: Secondary | ICD-10-CM

## 2018-12-04 DIAGNOSIS — L732 Hidradenitis suppurativa: Secondary | ICD-10-CM

## 2018-12-04 DIAGNOSIS — Z8619 Personal history of other infectious and parasitic diseases: Secondary | ICD-10-CM

## 2018-12-04 NOTE — Progress Notes (Signed)
Pt presents for telehealth visit. Pt identified with two pt identifiers. She is currently [redacted]w[redacted]d today. She is unable to check her bp at this time. Pt has no concerns.

## 2018-12-04 NOTE — Progress Notes (Signed)
   TELEHEALTH VIRTUAL OBSTETRICS VISIT ENCOUNTER NOTE  I connected with Diamond Burton on 12/04/18 at  3:15 PM EDT by telephone at home and verified that I am speaking with the correct person using two identifiers.   I discussed the limitations, risks, security and privacy concerns of performing an evaluation and management service by telephone and the availability of in person appointments. I also discussed with the patient that there may be a patient responsible charge related to this service. The patient expressed understanding and agreed to proceed.  Subjective:  Diamond Burton is a 33 y.o. [id number] at [redacted]w[redacted]d being followed for ongoing prenatal care.  She is currently monitored for the following issues for this high-risk pregnancy and has Hydradenitis; History of herpes genitalis; History of maternal syphilis, currently pregnant; Rh negative state in antepartum period; Obesity in pregnancy, antepartum; History of low birth weight infant; History of Erb's palsy; Homicidal ideation; Suicidal ideation; Supervision of high-risk pregnancy; Chlamydia infection affecting pregnancy in first trimester; Gonorrhea affecting pregnancy in first trimester; and Group B streptococcal bacteriuria on their problem list.  Patient reports headache and heartburn. Reports fetal movement. Denies any contractions, bleeding or leaking of fluid.   The following portions of the patient's history were reviewed and updated as appropriate: allergies, current medications, past family history, past medical history, past social history, past surgical history and problem list.   Objective:   General:  Alert, oriented and cooperative.   Mental Status: Normal mood and affect perceived. Normal judgment and thought content.  Rest of physical exam deferred due to type of encounter  Assessment and Plan:  Pregnancy: G5P3013 at [redacted]w[redacted]d 1. Supervision of high risk pregnancy, antepartum  2. History of low birth weight infant  3.  Hydradenitis - currently being treated at [facility]  4. History of herpes genitalis  5. History of maternal syphilis, currently pregnant  6. Rh negative state in antepartum period - Rhogam at 28 weeks and postpartum  7. Obesity in pregnancy, antepartum  8. Group B streptococcal bacteriuria - treat in labor  Preterm labor symptoms and general obstetric precautions including but not limited to vaginal bleeding, contractions, leaking of fluid and fetal movement were reviewed in detail with the patient.  I discussed the assessment and treatment plan with the patient. The patient was provided an opportunity to ask questions and all were answered. The patient agreed with the plan and demonstrated an understanding of the instructions. The patient was advised to call back or seek an in-person office evaluation/go to MAU at [facility] for any urgent or concerning symptoms. Please refer to After Visit Summary for other counseling recommendations.   I provided 10 minutes of non-face-to-face time during this encounter.    Future Appointments  Date Time Provider [place]  01/01/2019  3:15 PM Constant, Vickii Chafe, MD [place] None    Baltazar Najjar, [place] for [facility], [place] [facility] 12-04-2018

## 2018-12-17 ENCOUNTER — Ambulatory Visit (HOSPITAL_COMMUNITY): Payer: Medicaid Other | Attending: Obstetrics and Gynecology

## 2019-01-01 ENCOUNTER — Encounter: Payer: Medicaid Other | Admitting: Obstetrics and Gynecology

## 2019-01-02 ENCOUNTER — Telehealth: Payer: Self-pay | Admitting: Obstetrics and Gynecology

## 2019-01-07 ENCOUNTER — Encounter: Payer: Self-pay | Admitting: Family Medicine

## 2019-01-07 ENCOUNTER — Ambulatory Visit (INDEPENDENT_AMBULATORY_CARE_PROVIDER_SITE_OTHER): Payer: Medicaid Other | Admitting: Family Medicine

## 2019-01-07 DIAGNOSIS — O26892 Other specified pregnancy related conditions, second trimester: Secondary | ICD-10-CM

## 2019-01-07 DIAGNOSIS — O0990 Supervision of high risk pregnancy, unspecified, unspecified trimester: Secondary | ICD-10-CM

## 2019-01-07 DIAGNOSIS — O99212 Obesity complicating pregnancy, second trimester: Secondary | ICD-10-CM | POA: Diagnosis not present

## 2019-01-07 DIAGNOSIS — Z3A23 23 weeks gestation of pregnancy: Secondary | ICD-10-CM

## 2019-01-07 DIAGNOSIS — O98211 Gonorrhea complicating pregnancy, first trimester: Secondary | ICD-10-CM

## 2019-01-07 DIAGNOSIS — O98512 Other viral diseases complicating pregnancy, second trimester: Secondary | ICD-10-CM

## 2019-01-07 DIAGNOSIS — Z6791 Unspecified blood type, Rh negative: Secondary | ICD-10-CM

## 2019-01-07 DIAGNOSIS — Z8619 Personal history of other infectious and parasitic diseases: Secondary | ICD-10-CM

## 2019-01-07 DIAGNOSIS — A749 Chlamydial infection, unspecified: Secondary | ICD-10-CM

## 2019-01-07 DIAGNOSIS — O98812 Other maternal infectious and parasitic diseases complicating pregnancy, second trimester: Secondary | ICD-10-CM

## 2019-01-07 DIAGNOSIS — O99210 Obesity complicating pregnancy, unspecified trimester: Secondary | ICD-10-CM

## 2019-01-07 DIAGNOSIS — O98212 Gonorrhea complicating pregnancy, second trimester: Secondary | ICD-10-CM

## 2019-01-07 DIAGNOSIS — O0992 Supervision of high risk pregnancy, unspecified, second trimester: Secondary | ICD-10-CM | POA: Diagnosis not present

## 2019-01-07 DIAGNOSIS — R8271 Bacteriuria: Secondary | ICD-10-CM

## 2019-01-07 DIAGNOSIS — O360920 Maternal care for other rhesus isoimmunization, second trimester, not applicable or unspecified: Secondary | ICD-10-CM

## 2019-01-07 MED ORDER — BLOOD PRESSURE KIT: PACK | 0 refills | Status: DC

## 2019-01-07 MED ORDER — MICONAZOLE NITRATE 2 % VA CREA: 1.0000 | TOPICAL_CREAM | Freq: Every day | VAGINAL | 2 refills | Status: DC

## 2019-01-07 NOTE — Progress Notes (Signed)
   TELEHEALTH VIRTUAL OBSTETRICS VISIT ENCOUNTER NOTE  I connected with Diamond Burton on 01/07/19 at  4:15 PM EDT by telephone at home and verified that I am speaking with the correct person using two identifiers.   I discussed the limitations, risks, security and privacy concerns of performing an evaluation and management service by telephone and the availability of in person appointments. I also discussed with the patient that there may be a patient responsible charge related to this service. The patient expressed understanding and agreed to proceed.  Subjective:  Diamond Burton is a 33 y.o. G2R4270 at [redacted]w[redacted]d being followed for ongoing prenatal care.  She is currently monitored for the following issues for this high-risk pregnancy and has Hydradenitis; History of herpes genitalis; History of maternal syphilis, currently pregnant; Rh negative state in antepartum period; Obesity in pregnancy, antepartum; History of low birth weight infant; History of Erb's palsy; Homicidal ideation; Suicidal ideation; Supervision of high-risk pregnancy; Chlamydia infection affecting pregnancy in first trimester; Gonorrhea affecting pregnancy in first trimester; and Group B streptococcal bacteriuria on their problem list.  Patient reports no complaints. Reports fetal movement. Denies any contractions, bleeding or leaking of fluid.   The following portions of the patient's history were reviewed and updated as appropriate: allergies, current medications, past family history, past medical history, past social history, past surgical history and problem list.   Objective:   General:  Alert, oriented and cooperative.   Mental Status: Normal mood and affect perceived. Normal judgment and thought content.  Rest of physical exam deferred due to type of encounter  Assessment and Plan:  Pregnancy: G5P3013 at [redacted]w[redacted]d 1. Supervision of high risk pregnancy, antepartum Good fetal movement. Scheduled for [place] at end of week.   2. Rh negative state in antepartum period Rhogam at next appt.  3. Obesity in pregnancy, antepartum   4. History of herpes genitalis Valtrex at 35-36 weeks  5. Group B streptococcal bacteriuria Intrapartum PPX  6. Gonorrhea affecting pregnancy in first trimester TOC neg  7. Chlamydia infection affecting pregnancy in first trimester TOC neg  Preterm labor symptoms and general obstetric precautions including but not limited to vaginal bleeding, contractions, leaking of fluid and fetal movement were reviewed in detail with the patient.  I discussed the assessment and treatment plan with the patient. The patient was provided an opportunity to ask questions and all were answered. The patient agreed with the plan and demonstrated an understanding of the instructions. The patient was advised to call back or seek an in-person office evaluation/go to MAU at [facility] for any urgent or concerning symptoms. Please refer to After Visit Summary for other counseling recommendations.   I provided 11 minutes of non-face-to-face time during this encounter.  Return in about 4 weeks (around 02/04/2019) for OB f/u, 2 hr GTT, In Office.  Future Appointments  Date Time Provider [place]  01/11/2019  1:00 PM WH-MFC [place] 3 WH-MFCUS MFC-US    Jacob J Stinson, DO [place] for [employer], [place]

## 2019-01-07 NOTE — Progress Notes (Signed)
I connected with Diamond Burton on 01/07/19 at  4:15 PM EDT by telephone and verified that I am speaking with the correct person using two identifiers.  Pt does not have access to a BP cuff. Cuff ordered today. Pt c/o intermittent LLQ abdominal pain since last week. Pt also c/o intermittent "sharp shooting" vaginal pain and thick white itchy vaginal discharge; denies odor.

## 2019-01-11 ENCOUNTER — Other Ambulatory Visit (HOSPITAL_COMMUNITY): Payer: Self-pay | Admitting: *Deleted

## 2019-01-11 ENCOUNTER — Other Ambulatory Visit: Payer: Self-pay | Admitting: Obstetrics & Gynecology

## 2019-01-11 ENCOUNTER — Other Ambulatory Visit: Payer: Self-pay

## 2019-01-11 ENCOUNTER — Ambulatory Visit (HOSPITAL_COMMUNITY)
Admission: RE | Admit: 2019-01-11 | Discharge: 2019-01-11 | Disposition: A | Discharge status: Home / Self Care | Payer: Medicaid Other | Source: Ambulatory Visit | Attending: Obstetrics & Gynecology | Admitting: Obstetrics & Gynecology

## 2019-01-11 DIAGNOSIS — O99210 Obesity complicating pregnancy, unspecified trimester: Secondary | ICD-10-CM | POA: Diagnosis not present

## 2019-01-11 DIAGNOSIS — O99212 Obesity complicating pregnancy, second trimester: Secondary | ICD-10-CM

## 2019-01-11 DIAGNOSIS — O0991 Supervision of high risk pregnancy, unspecified, first trimester: Secondary | ICD-10-CM

## 2019-01-11 DIAGNOSIS — Z3689 Encounter for other specified antenatal screening: Secondary | ICD-10-CM | POA: Insufficient documentation

## 2019-01-11 DIAGNOSIS — Z3A24 24 weeks gestation of pregnancy: Secondary | ICD-10-CM

## 2019-01-11 DIAGNOSIS — Z362 Encounter for other antenatal screening follow-up: Secondary | ICD-10-CM

## 2019-02-04 ENCOUNTER — Other Ambulatory Visit: Payer: Medicaid Other

## 2019-02-04 ENCOUNTER — Encounter: Payer: Medicaid Other | Admitting: Obstetrics and Gynecology

## 2019-02-08 ENCOUNTER — Ambulatory Visit (HOSPITAL_COMMUNITY): Payer: Medicaid Other

## 2019-02-12 ENCOUNTER — Encounter (HOSPITAL_COMMUNITY): Payer: Self-pay | Admitting: *Deleted

## 2019-02-12 ENCOUNTER — Ambulatory Visit (HOSPITAL_COMMUNITY): Payer: Medicaid Other | Admitting: *Deleted

## 2019-02-12 ENCOUNTER — Other Ambulatory Visit: Payer: Self-pay

## 2019-02-12 ENCOUNTER — Ambulatory Visit (HOSPITAL_COMMUNITY)
Admission: RE | Admit: 2019-02-12 | Discharge: 2019-02-12 | Disposition: A | Discharge status: Home / Self Care | Payer: Medicaid Other | Source: Ambulatory Visit | Attending: Obstetrics and Gynecology | Admitting: Obstetrics and Gynecology

## 2019-02-12 DIAGNOSIS — Z87898 Personal history of other specified conditions: Secondary | ICD-10-CM | POA: Diagnosis present

## 2019-02-12 DIAGNOSIS — Z362 Encounter for other antenatal screening follow-up: Secondary | ICD-10-CM | POA: Diagnosis not present

## 2019-02-12 DIAGNOSIS — R8271 Bacteriuria: Secondary | ICD-10-CM | POA: Insufficient documentation

## 2019-02-12 DIAGNOSIS — O0990 Supervision of high risk pregnancy, unspecified, unspecified trimester: Secondary | ICD-10-CM | POA: Insufficient documentation

## 2019-02-12 DIAGNOSIS — Z3A28 28 weeks gestation of pregnancy: Secondary | ICD-10-CM | POA: Diagnosis not present

## 2019-02-12 DIAGNOSIS — O99212 Obesity complicating pregnancy, second trimester: Secondary | ICD-10-CM

## 2019-02-13 ENCOUNTER — Telehealth: Payer: Self-pay | Admitting: Obstetrics and Gynecology

## 2019-02-13 ENCOUNTER — Other Ambulatory Visit (HOSPITAL_COMMUNITY): Payer: Self-pay | Admitting: *Deleted

## 2019-02-13 DIAGNOSIS — O99213 Obesity complicating pregnancy, third trimester: Secondary | ICD-10-CM

## 2019-02-19 ENCOUNTER — Ambulatory Visit (INDEPENDENT_AMBULATORY_CARE_PROVIDER_SITE_OTHER): Payer: Medicaid Other | Admitting: Obstetrics and Gynecology

## 2019-02-19 ENCOUNTER — Encounter: Payer: Self-pay | Admitting: Obstetrics and Gynecology

## 2019-02-19 ENCOUNTER — Other Ambulatory Visit: Payer: Self-pay

## 2019-02-19 ENCOUNTER — Other Ambulatory Visit: Payer: Medicaid Other

## 2019-02-19 ENCOUNTER — Other Ambulatory Visit (HOSPITAL_COMMUNITY)
Admission: RE | Admit: 2019-02-19 | Discharge: 2019-02-19 | Disposition: A | Discharge status: Home / Self Care | Payer: Medicaid Other | Source: Ambulatory Visit | Attending: Obstetrics and Gynecology | Admitting: Obstetrics and Gynecology

## 2019-02-19 VITALS — BP 115/70 | HR 90 | Temp 98.1°F | Wt 232.3 lb

## 2019-02-19 DIAGNOSIS — O26893 Other specified pregnancy related conditions, third trimester: Secondary | ICD-10-CM

## 2019-02-19 DIAGNOSIS — Z8619 Personal history of other infectious and parasitic diseases: Secondary | ICD-10-CM

## 2019-02-19 DIAGNOSIS — O99213 Obesity complicating pregnancy, third trimester: Secondary | ICD-10-CM

## 2019-02-19 DIAGNOSIS — O99210 Obesity complicating pregnancy, unspecified trimester: Secondary | ICD-10-CM

## 2019-02-19 DIAGNOSIS — Z3A29 29 weeks gestation of pregnancy: Secondary | ICD-10-CM

## 2019-02-19 DIAGNOSIS — O0993 Supervision of high risk pregnancy, unspecified, third trimester: Secondary | ICD-10-CM

## 2019-02-19 DIAGNOSIS — O0990 Supervision of high risk pregnancy, unspecified, unspecified trimester: Secondary | ICD-10-CM

## 2019-02-19 DIAGNOSIS — R8271 Bacteriuria: Secondary | ICD-10-CM

## 2019-02-19 DIAGNOSIS — O360130 Maternal care for anti-D [Rh] antibodies, third trimester, not applicable or unspecified: Secondary | ICD-10-CM | POA: Diagnosis not present

## 2019-02-19 DIAGNOSIS — Z6791 Unspecified blood type, Rh negative: Secondary | ICD-10-CM

## 2019-02-19 DIAGNOSIS — O99820 Streptococcus B carrier state complicating pregnancy: Secondary | ICD-10-CM

## 2019-02-19 MED ORDER — RHO D IMMUNE GLOBULIN 1500 UNIT/2ML IJ SOSY
300.0000 ug | PREFILLED_SYRINGE | Freq: Once | INTRAMUSCULAR | Status: AC
  Administered 2019-02-19: 300 ug via INTRAMUSCULAR

## 2019-02-19 NOTE — Progress Notes (Signed)
ROB/GTT.  Declined RDAP

## 2019-02-19 NOTE — Progress Notes (Signed)
Pt presents for ROB/2 gtt labs/Rhogam. Pt c/o vaginal irritation; recent ABx use.  Pt c/o vaginal dryness.

## 2019-02-19 NOTE — Progress Notes (Addendum)
   PRENATAL VISIT NOTE  Subjective:  Diamond Burton is a 33 y.o. Q6P6195 at [redacted]w[redacted]d being seen today for ongoing prenatal care.  She is currently monitored for the following issues for this low-risk pregnancy and has Hydradenitis; History of herpes genitalis; History of maternal syphilis, currently pregnant; Rh negative state in antepartum period; Obesity in pregnancy, antepartum; History of low birth weight infant; History of Erb's palsy; Homicidal ideation; Suicidal ideation; Supervision of high-risk pregnancy; Chlamydia infection affecting pregnancy in first trimester; Gonorrhea affecting pregnancy in first trimester; and Group B streptococcal bacteriuria on their problem list.  Patient reports vaginal irritation.  Contractions: Not present. Vag. Bleeding: None.  Movement: Present. Denies leaking of fluid.   The following portions of the patient's history were reviewed and updated as appropriate: allergies, current medications, past family history, past medical history, past social history, past surgical history and problem list.   Objective:   Vitals:   02/19/19 0830  BP: 115/70  Pulse: 90  Temp: 98.1 F (36.7 C)  Weight: 232 lb 4.8 oz (105.4 kg)    Fetal Status: Fetal Heart Rate (bpm): 145 Fundal Height: 30 cm Movement: Present     General:  Alert, oriented and cooperative. Patient is in no acute distress.  Skin: Skin is warm and dry. No rash noted.   Cardiovascular: Normal heart rate noted  Respiratory: Normal respiratory effort, no problems with respiration noted  Abdomen: Soft, gravid, appropriate for gestational age.  Pain/Pressure: Absent     Pelvic: Cervical exam deferred        Extremities: Normal range of motion.  Edema: None  Mental Status: Normal mood and affect. Normal behavior. Normal judgment and thought content.   Assessment and Plan:  Pregnancy: [id number] at [redacted]w[redacted]d 1. Supervision of high risk pregnancy, antepartum Patient is doing well wit some vaginal dryness with  intercourse Vaginal swab collected Third trimester labs today Patient declined tdap and flu vaccine Patient plans nexplanon for contraception  2. Group B streptococcal bacteriuria Prophylaxis in labor  3. Rh negative state in antepartum period Rhogam today  4. Obesity in pregnancy, antepartum   5. History of herpes genitalis Prophylaxis at 36 week  Preterm labor symptoms and general obstetric precautions including but not limited to vaginal bleeding, contractions, leaking of fluid and fetal movement were reviewed in detail with the patient. Please refer to After Visit Summary for other counseling recommendations.   Return in about 2 weeks (around 03/05/2019) for [facility], [place], Low risk.  Future Appointments  Date Time Provider [place]  03/12/2019  1:30 PM [place] MFC-US  03/12/2019  1:30 PM [place] 5 WH-MFCUS MFC-US    Mora Bellman, MD

## 2019-02-20 LAB — GLUCOSE TOLERANCE, 2 HOURS W/ 1HR
Glucose, 1 hour: 167 mg/dL (ref 65–179)
Glucose, 2 hour: 118 mg/dL (ref 65–152)
Glucose, Fasting: 91 mg/dL (ref 65–91)

## 2019-02-20 LAB — CBC
Hematocrit: 36.6 % (ref 34.0–46.6)
Hemoglobin: 12.6 g/dL (ref 11.1–15.9)
MCH: 30.6 pg (ref 26.6–33.0)
MCHC: 34.4 g/dL (ref 31.5–35.7)
MCV: 89 fL (ref 79–97)
Platelets: 310 10*3/uL (ref 150–450)
RBC: 4.12 x10E6/uL (ref 3.77–5.28)
RDW: 13.9 % (ref 11.7–15.4)
WBC: 9.3 10*3/uL (ref 3.4–10.8)

## 2019-02-20 LAB — HIV ANTIBODY (ROUTINE TESTING W REFLEX): HIV Screen 4th Generation wRfx: NONREACTIVE

## 2019-02-20 LAB — RPR: RPR Ser Ql: NONREACTIVE

## 2019-02-21 LAB — CERVICOVAGINAL ANCILLARY ONLY
Bacterial vaginitis: NEGATIVE
Candida vaginitis: POSITIVE — AB
Chlamydia: NEGATIVE
Neisseria Gonorrhea: NEGATIVE
Trichomonas: NEGATIVE

## 2019-02-21 MED ORDER — FLUCONAZOLE 150 MG PO TABS: 150.0000 mg | ORAL_TABLET | Freq: Once | ORAL | 0 refills | Status: AC

## 2019-02-21 NOTE — Addendum Note (Signed)
Addended by: Mora Bellman on: 02/21/2019 11:30 AM   Modules accepted: Orders

## 2019-03-05 ENCOUNTER — Ambulatory Visit (INDEPENDENT_AMBULATORY_CARE_PROVIDER_SITE_OTHER): Payer: Medicaid Other | Admitting: Obstetrics

## 2019-03-05 ENCOUNTER — Encounter: Payer: Self-pay | Admitting: Obstetrics

## 2019-03-05 DIAGNOSIS — O98211 Gonorrhea complicating pregnancy, first trimester: Secondary | ICD-10-CM

## 2019-03-05 DIAGNOSIS — O09899 Supervision of other high risk pregnancies, unspecified trimester: Secondary | ICD-10-CM

## 2019-03-05 DIAGNOSIS — O360130 Maternal care for anti-D [Rh] antibodies, third trimester, not applicable or unspecified: Secondary | ICD-10-CM

## 2019-03-05 DIAGNOSIS — O98813 Other maternal infectious and parasitic diseases complicating pregnancy, third trimester: Secondary | ICD-10-CM

## 2019-03-05 DIAGNOSIS — Z6791 Unspecified blood type, Rh negative: Secondary | ICD-10-CM

## 2019-03-05 DIAGNOSIS — O09893 Supervision of other high risk pregnancies, third trimester: Secondary | ICD-10-CM

## 2019-03-05 DIAGNOSIS — Z87898 Personal history of other specified conditions: Secondary | ICD-10-CM

## 2019-03-05 DIAGNOSIS — O99210 Obesity complicating pregnancy, unspecified trimester: Secondary | ICD-10-CM

## 2019-03-05 DIAGNOSIS — O99820 Streptococcus B carrier state complicating pregnancy: Secondary | ICD-10-CM | POA: Diagnosis not present

## 2019-03-05 DIAGNOSIS — O0990 Supervision of high risk pregnancy, unspecified, unspecified trimester: Secondary | ICD-10-CM

## 2019-03-05 DIAGNOSIS — R8271 Bacteriuria: Secondary | ICD-10-CM

## 2019-03-05 DIAGNOSIS — Z3A31 31 weeks gestation of pregnancy: Secondary | ICD-10-CM

## 2019-03-05 DIAGNOSIS — O99213 Obesity complicating pregnancy, third trimester: Secondary | ICD-10-CM

## 2019-03-05 DIAGNOSIS — O98213 Gonorrhea complicating pregnancy, third trimester: Secondary | ICD-10-CM

## 2019-03-05 DIAGNOSIS — Z8619 Personal history of other infectious and parasitic diseases: Secondary | ICD-10-CM

## 2019-03-05 DIAGNOSIS — A749 Chlamydial infection, unspecified: Secondary | ICD-10-CM

## 2019-03-05 NOTE — Progress Notes (Signed)
   TELEHEALTH VIRTUAL OBSTETRICS VISIT ENCOUNTER NOTE  I connected with Diamond Burton on 03/05/19 at  2:15 PM EDT by telephone at home and verified that I am speaking with the correct person using two identifiers.   I discussed the limitations, risks, security and privacy concerns of performing an evaluation and management service by telephone and the availability of in person appointments. I also discussed with the patient that there may be a patient responsible charge related to this service. The patient expressed understanding and agreed to proceed.  Subjective:  Diamond Burton is a 33 y.o. O6Z1245 at [redacted]w[redacted]d being followed for ongoing prenatal care.  She is currently monitored for the following issues for this high-risk pregnancy and has Hydradenitis; History of herpes genitalis; History of maternal syphilis, currently pregnant; Rh negative state in antepartum period; Obesity in pregnancy, antepartum; History of low birth weight infant; History of Erb's palsy; Homicidal ideation; Suicidal ideation; Supervision of high-risk pregnancy; Chlamydia infection affecting pregnancy in first trimester; Gonorrhea affecting pregnancy in first trimester; and Group B streptococcal bacteriuria on their problem list.  Patient reports pelvic pressure. Reports fetal movement. Denies any contractions, bleeding or leaking of fluid.   The following portions of the patient's history were reviewed and updated as appropriate: allergies, current medications, past family history, past medical history, past social history, past surgical history and problem list.   Objective:   General:  Alert, oriented and cooperative.   Mental Status: Normal mood and affect perceived. Normal judgment and thought content.  Rest of physical exam deferred due to type of encounter  Assessment and Plan:  Pregnancy: G5P3013 at [redacted]w[redacted]d 1. Supervision of high risk pregnancy, antepartum  2. History of low birth weight - taking Baby ASA   3. Rh negative state in antepartum period - Rhogam postpartum  4. Group B streptococcal bacteriuria, treated - treat in labor  5. History of herpes genitalis - clinically stable  6. Chlamydia infection affecting pregnancy in first trimester, treated - TOC at 36 weeks  7. Gonorrhea affecting pregnancy in first trimester, treated - TOC at 36 weeks  8. History of maternal syphilis, currently pregnant - clinically stable  9. Obesity in pregnancy, antepartum   Preterm labor symptoms and general obstetric precautions including but not limited to vaginal bleeding, contractions, leaking of fluid and fetal movement were reviewed in detail with the patient.  I discussed the assessment and treatment plan with the patient. The patient was provided an opportunity to ask questions and all were answered. The patient agreed with the plan and demonstrated an understanding of the instructions. The patient was advised to call back or seek an in-person office evaluation/go to MAU at [facility] for any urgent or concerning symptoms. Please refer to After Visit Summary for other counseling recommendations.   I provided 10 minutes of non-face-to-face time during this encounter.  Return in about 2 weeks (around 03/19/2019) for MyChart.  Future Appointments  Date Time Provider [place]  03/12/2019  1:30 PM [place] MFC-US  03/12/2019  1:30 PM [place] 5 WH-MFCUS MFC-US    Baltazar Najjar, [place] for [facility], [place] Group 03/05/2019

## 2019-03-05 NOTE — Progress Notes (Signed)
Pt cannot check BP today- does not have cuff. 

## 2019-03-12 ENCOUNTER — Ambulatory Visit (HOSPITAL_COMMUNITY): Payer: Medicaid Other | Admitting: *Deleted

## 2019-03-12 ENCOUNTER — Ambulatory Visit (HOSPITAL_COMMUNITY)
Admission: RE | Admit: 2019-03-12 | Discharge: 2019-03-12 | Disposition: A | Discharge status: Home / Self Care | Payer: Medicaid Other | Source: Ambulatory Visit | Attending: Maternal & Fetal Medicine | Admitting: Maternal & Fetal Medicine

## 2019-03-12 ENCOUNTER — Encounter (HOSPITAL_COMMUNITY): Payer: Self-pay | Admitting: *Deleted

## 2019-03-12 ENCOUNTER — Telehealth: Payer: Self-pay

## 2019-03-12 ENCOUNTER — Other Ambulatory Visit: Payer: Self-pay

## 2019-03-12 DIAGNOSIS — R8271 Bacteriuria: Secondary | ICD-10-CM

## 2019-03-12 DIAGNOSIS — O0990 Supervision of high risk pregnancy, unspecified, unspecified trimester: Secondary | ICD-10-CM

## 2019-03-12 DIAGNOSIS — Z87898 Personal history of other specified conditions: Secondary | ICD-10-CM

## 2019-03-12 DIAGNOSIS — Z3A32 32 weeks gestation of pregnancy: Secondary | ICD-10-CM

## 2019-03-12 DIAGNOSIS — O99213 Obesity complicating pregnancy, third trimester: Secondary | ICD-10-CM | POA: Insufficient documentation

## 2019-03-12 NOTE — Telephone Encounter (Signed)
Returned call, no answer, left vm 

## 2019-03-13 ENCOUNTER — Telehealth: Payer: Self-pay | Admitting: *Deleted

## 2019-03-13 NOTE — Telephone Encounter (Signed)
Patient called to speak with MD regarding recurrent yeast and other concerns. Transferred to scheduler for appointment.  Derl Barrow, RN

## 2019-03-14 ENCOUNTER — Other Ambulatory Visit: Payer: Self-pay

## 2019-03-14 ENCOUNTER — Ambulatory Visit (INDEPENDENT_AMBULATORY_CARE_PROVIDER_SITE_OTHER): Payer: Medicaid Other | Admitting: Obstetrics

## 2019-03-14 ENCOUNTER — Other Ambulatory Visit (HOSPITAL_COMMUNITY)
Admission: RE | Admit: 2019-03-14 | Discharge: 2019-03-14 | Disposition: A | Discharge status: Home / Self Care | Payer: Medicaid Other | Source: Ambulatory Visit | Attending: Obstetrics | Admitting: Obstetrics

## 2019-03-14 VITALS — BP 120/83 | HR 79 | Wt 224.7 lb

## 2019-03-14 DIAGNOSIS — O0993 Supervision of high risk pregnancy, unspecified, third trimester: Secondary | ICD-10-CM

## 2019-03-14 DIAGNOSIS — B3731 Acute candidiasis of vulva and vagina: Secondary | ICD-10-CM

## 2019-03-14 DIAGNOSIS — B373 Candidiasis of vulva and vagina: Secondary | ICD-10-CM | POA: Insufficient documentation

## 2019-03-14 DIAGNOSIS — O99210 Obesity complicating pregnancy, unspecified trimester: Secondary | ICD-10-CM

## 2019-03-14 DIAGNOSIS — Z6791 Unspecified blood type, Rh negative: Secondary | ICD-10-CM

## 2019-03-14 DIAGNOSIS — O98213 Gonorrhea complicating pregnancy, third trimester: Secondary | ICD-10-CM

## 2019-03-14 DIAGNOSIS — O99213 Obesity complicating pregnancy, third trimester: Secondary | ICD-10-CM

## 2019-03-14 DIAGNOSIS — A749 Chlamydial infection, unspecified: Secondary | ICD-10-CM

## 2019-03-14 DIAGNOSIS — Z8619 Personal history of other infectious and parasitic diseases: Secondary | ICD-10-CM

## 2019-03-14 DIAGNOSIS — F1210 Cannabis abuse, uncomplicated: Secondary | ICD-10-CM

## 2019-03-14 DIAGNOSIS — Z87898 Personal history of other specified conditions: Secondary | ICD-10-CM

## 2019-03-14 DIAGNOSIS — O09893 Supervision of other high risk pregnancies, third trimester: Secondary | ICD-10-CM

## 2019-03-14 DIAGNOSIS — Z3A32 32 weeks gestation of pregnancy: Secondary | ICD-10-CM

## 2019-03-14 DIAGNOSIS — O98211 Gonorrhea complicating pregnancy, first trimester: Secondary | ICD-10-CM

## 2019-03-14 DIAGNOSIS — O0990 Supervision of high risk pregnancy, unspecified, unspecified trimester: Secondary | ICD-10-CM

## 2019-03-14 DIAGNOSIS — O360130 Maternal care for anti-D [Rh] antibodies, third trimester, not applicable or unspecified: Secondary | ICD-10-CM

## 2019-03-14 DIAGNOSIS — O98813 Other maternal infectious and parasitic diseases complicating pregnancy, third trimester: Secondary | ICD-10-CM

## 2019-03-14 DIAGNOSIS — O09899 Supervision of other high risk pregnancies, unspecified trimester: Secondary | ICD-10-CM

## 2019-03-14 HISTORY — PX: HYDRADENITIS EXCISION: SHX5243

## 2019-03-14 MED ORDER — TERCONAZOLE 0.4 % VA CREA: 1.0000 | TOPICAL_CREAM | Freq: Every day | VAGINAL | 2 refills | Status: DC

## 2019-03-14 NOTE — Addendum Note (Signed)
Addended byCleotilde Neer on: 03/14/2019 10:58 AM   Modules accepted: Orders

## 2019-03-14 NOTE — Progress Notes (Signed)
Subjective:  Diamond Burton is a 33 y.o. [id number] at [redacted]w[redacted]d being seen today for ongoing prenatal care.  She is currently monitored for the following issues for this high-risk pregnancy and has Hydradenitis; History of herpes genitalis; History of maternal syphilis, currently pregnant; Rh negative state in antepartum period; Obesity in pregnancy, antepartum; History of low birth weight infant; History of Erb's palsy; Homicidal ideation; Suicidal ideation; Supervision of high-risk pregnancy; Chlamydia infection affecting pregnancy in first trimester; Gonorrhea affecting pregnancy in first trimester; and Group B streptococcal bacteriuria on their problem list.  Patient reports extreme stress from some domestic issues.  Her daughter has left home and is missing.  She states that she has recently started smoking marijuana to help relieve some of the stress.  Contractions: Irritability. Vag. Bleeding: None.  Movement: Present. Denies leaking of fluid.   The following portions of the patient's history were reviewed and updated as appropriate: allergies, current medications, past family history, past medical history, past social history, past surgical history and problem list. Problem list updated.  Objective:   Vitals:   03/14/19 1002  BP: 120/83  Pulse: 79  Weight: 224 lb 11.2 oz (101.9 kg)    Fetal Status:     Movement: Present     General:  Alert, oriented and cooperative. Patient is in no acute distress.  Skin: Skin is warm and dry. No rash noted.   Cardiovascular: Normal heart rate noted  Respiratory: Normal respiratory effort, no problems with respiration noted  Abdomen: Soft, gravid, appropriate for gestational age. Pain/Pressure: Absent     Pelvic:  Cervical exam deferred        Extremities: Normal range of motion.  Edema: None  Mental Status: Normal mood and affect. Normal behavior. Normal judgment and thought content.   Urinalysis:      Assessment and Plan:  Pregnancy: [id number] at  [redacted]w[redacted]d  1. Supervision of high risk pregnancy, antepartum  2. History of low birth weight - followed by MFM with ultrasounds for interval growth  3. Rh negative state in antepartum period - rhogam postpartum  4. Chlamydia infection affecting pregnancy in first trimester - TOC cultures repeated  5. Gonorrhea affecting pregnancy in first trimester - TOC cultures repeated  6. History of maternal syphilis, currently pregnant  7. History of herpes genitalis - Valtrex suppression  8. Obesity in pregnancy, antepartum  9. Candida vaginitis Rx: - terconazole (TERAZOL 7) 0.4 % vaginal cream; Place 1 applicator vaginally at bedtime.  Dispense: 45 g; Refill: 2  10. Marijuana abuse for relief of stress - discussed the known risks to baby of marijuana exposure   Preterm labor symptoms and general obstetric precautions including but not limited to vaginal bleeding, contractions, leaking of fluid and fetal movement were reviewed in detail with the patient. Please refer to After Visit Summary for other counseling recommendations.  Return in about 2 weeks (around 03/28/2019) for MyChart.   Shelly Bombard, MD  03/14/2019

## 2019-03-14 NOTE — Progress Notes (Signed)
Pt is here with c/o itching and discharge. Pt was treated for yeast infection about a month ago, pt feels that the symptoms never went away completely after first treatment.

## 2019-03-18 ENCOUNTER — Other Ambulatory Visit: Payer: Self-pay | Admitting: Obstetrics

## 2019-03-18 LAB — CERVICOVAGINAL ANCILLARY ONLY
Bacterial Vaginitis (gardnerella): NEGATIVE
Candida Glabrata: NEGATIVE
Candida Vaginitis: POSITIVE — AB
Chlamydia: NEGATIVE
Neisseria Gonorrhea: NEGATIVE
Trichomonas: NEGATIVE

## 2019-03-19 ENCOUNTER — Encounter (HOSPITAL_COMMUNITY): Payer: Self-pay

## 2019-03-19 ENCOUNTER — Other Ambulatory Visit: Payer: Self-pay

## 2019-03-19 ENCOUNTER — Telehealth: Payer: Self-pay | Admitting: *Deleted

## 2019-03-19 ENCOUNTER — Telehealth: Payer: Medicaid Other | Admitting: Obstetrics

## 2019-03-19 ENCOUNTER — Observation Stay (HOSPITAL_COMMUNITY)
Admission: AD | Admit: 2019-03-19 | Discharge: 2019-03-21 | Disposition: A | Discharge status: Home / Self Care | Payer: Medicaid Other | Attending: Surgery | Admitting: Surgery

## 2019-03-19 DIAGNOSIS — Z87442 Personal history of urinary calculi: Secondary | ICD-10-CM | POA: Diagnosis not present

## 2019-03-19 DIAGNOSIS — Z825 Family history of asthma and other chronic lower respiratory diseases: Secondary | ICD-10-CM | POA: Diagnosis not present

## 2019-03-19 DIAGNOSIS — O99343 Other mental disorders complicating pregnancy, third trimester: Secondary | ICD-10-CM | POA: Insufficient documentation

## 2019-03-19 DIAGNOSIS — Z6791 Unspecified blood type, Rh negative: Secondary | ICD-10-CM

## 2019-03-19 DIAGNOSIS — N739 Female pelvic inflammatory disease, unspecified: Secondary | ICD-10-CM | POA: Insufficient documentation

## 2019-03-19 DIAGNOSIS — Z87891 Personal history of nicotine dependence: Secondary | ICD-10-CM | POA: Diagnosis not present

## 2019-03-19 DIAGNOSIS — F4310 Post-traumatic stress disorder, unspecified: Secondary | ICD-10-CM | POA: Insufficient documentation

## 2019-03-19 DIAGNOSIS — Z79899 Other long term (current) drug therapy: Secondary | ICD-10-CM | POA: Insufficient documentation

## 2019-03-19 DIAGNOSIS — O99213 Obesity complicating pregnancy, third trimester: Secondary | ICD-10-CM | POA: Diagnosis not present

## 2019-03-19 DIAGNOSIS — K056 Periodontal disease, unspecified: Secondary | ICD-10-CM | POA: Diagnosis not present

## 2019-03-19 DIAGNOSIS — O91113 Abscess of breast associated with pregnancy, third trimester: Secondary | ICD-10-CM | POA: Diagnosis not present

## 2019-03-19 DIAGNOSIS — Z6837 Body mass index (BMI) 37.0-37.9, adult: Secondary | ICD-10-CM | POA: Diagnosis not present

## 2019-03-19 DIAGNOSIS — O26899 Other specified pregnancy related conditions, unspecified trimester: Secondary | ICD-10-CM

## 2019-03-19 DIAGNOSIS — F419 Anxiety disorder, unspecified: Secondary | ICD-10-CM | POA: Diagnosis not present

## 2019-03-19 DIAGNOSIS — F319 Bipolar disorder, unspecified: Secondary | ICD-10-CM | POA: Diagnosis not present

## 2019-03-19 DIAGNOSIS — O99210 Obesity complicating pregnancy, unspecified trimester: Secondary | ICD-10-CM | POA: Diagnosis present

## 2019-03-19 DIAGNOSIS — O91219 Nonpurulent mastitis associated with pregnancy, unspecified trimester: Secondary | ICD-10-CM

## 2019-03-19 DIAGNOSIS — O26893 Other specified pregnancy related conditions, third trimester: Secondary | ICD-10-CM | POA: Diagnosis present

## 2019-03-19 DIAGNOSIS — Z20828 Contact with and (suspected) exposure to other viral communicable diseases: Secondary | ICD-10-CM | POA: Diagnosis not present

## 2019-03-19 DIAGNOSIS — Z833 Family history of diabetes mellitus: Secondary | ICD-10-CM | POA: Insufficient documentation

## 2019-03-19 DIAGNOSIS — Z3A33 33 weeks gestation of pregnancy: Secondary | ICD-10-CM | POA: Diagnosis not present

## 2019-03-19 DIAGNOSIS — N949 Unspecified condition associated with female genital organs and menstrual cycle: Secondary | ICD-10-CM

## 2019-03-19 DIAGNOSIS — O0990 Supervision of high risk pregnancy, unspecified, unspecified trimester: Secondary | ICD-10-CM

## 2019-03-19 DIAGNOSIS — Z8249 Family history of ischemic heart disease and other diseases of the circulatory system: Secondary | ICD-10-CM | POA: Diagnosis not present

## 2019-03-19 DIAGNOSIS — Z818 Family history of other mental and behavioral disorders: Secondary | ICD-10-CM | POA: Diagnosis not present

## 2019-03-19 DIAGNOSIS — L732 Hidradenitis suppurativa: Secondary | ICD-10-CM | POA: Diagnosis present

## 2019-03-19 DIAGNOSIS — Z7982 Long term (current) use of aspirin: Secondary | ICD-10-CM | POA: Diagnosis not present

## 2019-03-19 DIAGNOSIS — Z91040 Latex allergy status: Secondary | ICD-10-CM | POA: Diagnosis not present

## 2019-03-19 DIAGNOSIS — O99333 Smoking (tobacco) complicating pregnancy, third trimester: Secondary | ICD-10-CM | POA: Diagnosis not present

## 2019-03-19 DIAGNOSIS — L309 Dermatitis, unspecified: Secondary | ICD-10-CM | POA: Diagnosis not present

## 2019-03-19 DIAGNOSIS — N611 Abscess of the breast and nipple: Secondary | ICD-10-CM | POA: Diagnosis present

## 2019-03-19 DIAGNOSIS — Z3689 Encounter for other specified antenatal screening: Secondary | ICD-10-CM

## 2019-03-19 LAB — BASIC METABOLIC PANEL
Anion gap: 11 (ref 5–15)
BUN: 7 mg/dL (ref 6–20)
CO2: 19 mmol/L — ABNORMAL LOW (ref 22–32)
Calcium: 9.4 mg/dL (ref 8.9–10.3)
Chloride: 104 mmol/L (ref 98–111)
Creatinine, Ser: 0.54 mg/dL (ref 0.44–1.00)
GFR calc Af Amer: >60 mL/min (ref >60)
GFR calc non Af Amer: >60 mL/min (ref >60)
Glucose, Bld: 75 mg/dL (ref 70–99)
Potassium: 3.6 mmol/L (ref 3.5–5.1)
Sodium: 134 mmol/L — ABNORMAL LOW (ref 135–145)

## 2019-03-19 LAB — CBC
HCT: 40.6 % (ref 36.0–46.0)
Hemoglobin: 14.1 g/dL (ref 12.0–15.0)
MCH: 31 pg (ref 26.0–34.0)
MCHC: 34.7 g/dL (ref 30.0–36.0)
MCV: 89.2 fL (ref 80.0–100.0)
Platelets: 323 10*3/uL (ref 150–400)
RBC: 4.55 MIL/uL (ref 3.87–5.11)
RDW: 13.6 % (ref 11.5–15.5)
WBC: 9.7 10*3/uL (ref 4.0–10.5)
nRBC: 0 % (ref 0.0–0.2)

## 2019-03-19 LAB — URINALYSIS, ROUTINE W REFLEX MICROSCOPIC
Bilirubin Urine: NEGATIVE
Glucose, UA: NEGATIVE mg/dL
Hgb urine dipstick: NEGATIVE
Ketones, ur: NEGATIVE mg/dL
Leukocytes,Ua: NEGATIVE
Nitrite: NEGATIVE
Protein, ur: NEGATIVE mg/dL
Specific Gravity, Urine: 1.023 (ref 1.005–1.030)
pH: 6 (ref 5.0–8.0)

## 2019-03-19 MED ORDER — CALCIUM CARBONATE ANTACID 500 MG PO CHEW: 2.0000 | CHEWABLE_TABLET | ORAL | Status: DC | PRN

## 2019-03-19 MED ORDER — CYCLOBENZAPRINE HCL 10 MG PO TABS
10.0000 mg | ORAL_TABLET | Freq: Once | ORAL | Status: AC
  Administered 2019-03-19: 17:00:00 10 mg via ORAL
  Filled 2019-03-19: qty 1

## 2019-03-19 MED ORDER — ZOLPIDEM TARTRATE 5 MG PO TABS: 5.0000 mg | ORAL_TABLET | Freq: Every evening | ORAL | Status: DC | PRN

## 2019-03-19 MED ORDER — ASPIRIN EC 81 MG PO TBEC
81.0000 mg | DELAYED_RELEASE_TABLET | Freq: Every day | ORAL | Status: DC
  Administered 2019-03-20 – 2019-03-21 (×2): 81 mg via ORAL
  Filled 2019-03-19 (×2): qty 1

## 2019-03-19 MED ORDER — COMFORT FIT MATERNITY SUPP SM MISC: 1.0000 [IU] | Freq: Every day | 0 refills | Status: DC | PRN

## 2019-03-19 MED ORDER — LACTATED RINGERS IV SOLN
INTRAVENOUS | Status: DC
  Administered 2019-03-19: 20:00:00 via INTRAVENOUS

## 2019-03-19 MED ORDER — PIPERACILLIN-TAZOBACTAM 3.375 G IVPB
3.3750 g | Freq: Three times a day (TID) | INTRAVENOUS | Status: DC
  Administered 2019-03-19 – 2019-03-21 (×5): 3.375 g via INTRAVENOUS
  Filled 2019-03-19 (×7): qty 50

## 2019-03-19 MED ORDER — POLYETHYLENE GLYCOL 3350 17 G PO PACK
17.0000 g | PACK | Freq: Every day | ORAL | Status: DC
  Administered 2019-03-20 – 2019-03-21 (×2): 17 g via ORAL
  Filled 2019-03-19 (×2): qty 1

## 2019-03-19 MED ORDER — VALACYCLOVIR HCL 500 MG PO TABS
1000.0000 mg | ORAL_TABLET | Freq: Every day | ORAL | Status: DC
  Administered 2019-03-20 – 2019-03-21 (×2): 1000 mg via ORAL
  Filled 2019-03-19 (×2): qty 2

## 2019-03-19 MED ORDER — ACETAMINOPHEN 325 MG PO TABS
650.0000 mg | ORAL_TABLET | ORAL | Status: DC | PRN
  Administered 2019-03-19: 650 mg via ORAL
  Filled 2019-03-19: qty 2

## 2019-03-19 MED ORDER — PRENATE MINI 29-0.6-0.4-350 MG PO CAPS: 1.0000 | ORAL_CAPSULE | Freq: Every day | ORAL | Status: DC

## 2019-03-19 MED ORDER — ACETAMINOPHEN 500 MG PO TABS
1000.0000 mg | ORAL_TABLET | Freq: Once | ORAL | Status: AC
  Administered 2019-03-19: 17:00:00 1000 mg via ORAL
  Filled 2019-03-19: qty 2

## 2019-03-19 MED ORDER — PRENATAL MULTIVITAMIN CH: 1.0000 | ORAL_TABLET | Freq: Every day | ORAL | Status: DC

## 2019-03-19 MED ORDER — DOCUSATE SODIUM 100 MG PO CAPS
100.0000 mg | ORAL_CAPSULE | Freq: Every day | ORAL | Status: DC
  Administered 2019-03-20 – 2019-03-21 (×2): 100 mg via ORAL
  Filled 2019-03-19 (×2): qty 1

## 2019-03-19 MED ORDER — SIMETHICONE 80 MG PO CHEW: 80.0000 mg | CHEWABLE_TABLET | Freq: Four times a day (QID) | ORAL | Status: DC | PRN

## 2019-03-19 NOTE — Progress Notes (Signed)
Pharmacy Antibiotic Note  Diamond Burton is a 33 y.o. female admitted on 03/19/2019 with left breast abscess at 33 weeks.  Pharmacy has been consulted for Zosyn dosing.Plan for I &D of breast abscess in am. Plan: Zosyn 3.375g IV q8h (4 hour infusion). No further dosage adjustments necessary.     Temp (24hrs), Avg:98.9 F (37.2 C), Min:98.9 F (37.2 C), Max:98.9 F (37.2 C)  Recent Labs  Lab 03/19/19 1935  WBC 9.7    CrCl cannot be calculated (Patient's most recent lab result is older than the maximum 21 days allowed.).    Allergies  Allergen Reactions  . Latex     Antimicrobials this admission:     Thank you for allowing pharmacy to be a part of this patient's care.  Vernie Ammons 03/19/2019 8:15 PM

## 2019-03-19 NOTE — Consult Note (Signed)
Reason for Consult:  Left breast abscess Referring Physician: [name] is an 33 y.o. female.  HPI: This is a 33 year old female who is [redacted] weeks pregnant who presents with a left breast abscess.  This has been worsening over the last week.  The patient has a history of hidradenitis of the axilla.  I do not see any previous interactions with general surgery in the patient's chart.  The patient states that this area in the lower inner quadrant of her left breast has become inflamed and is worsening.  It has become swollen and quite tender.  She has not noted any drainage in this area.  She came to the MAU today for evaluation.  No imaging or labs have been performed up to this point.  The patient last ate 3 hours prior to coming to the ED.  Past Medical History:  Diagnosis Date  . Abnormal Pap smear   . Bipolar disorder ([place])   . Chlamydia infection affecting pregnancy in first trimester 10/10/2018  . Depression    stopped meds, was wanting to see counselor  . Eczema   . Erb's palsy    L arm  . Erb's palsy    left arm  . Gonorrhea affecting pregnancy in first trimester 10/10/2018  . Herpes    frequent outbreaks on valtrex 500  . History of chlamydia   . 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 History of gonorrhea   . Hydradenitis   . Infection    UTI  . Kidney stones   . Ovarian cyst   . Periodontal disease   . PID (pelvic inflammatory disease)   . PTSD (post-traumatic stress disorder)   . Trichomonas contact     Past Surgical History:  Procedure Laterality Date  . NO PAST SURGERIES      Family History  Problem Relation Age of Onset  . Depression Mother   . Diabetes Mother   . Hypertension Mother   . Heart disease Mother   . Asthma Sister   . Asthma Daughter   . Hypertension Maternal Grandmother   . Diabetes Maternal Grandmother     Social History:  reports that she has quit smoking. Her smoking use included cigarettes. She has a 1.00 pack-year  smoking history. She has never used smokeless tobacco. She reports that she does not drink alcohol or use drugs.  Allergies:  Allergies  Allergen Reactions  . Latex     Medications: Prior to Admission medications   Medication Sig Start Date End Date Taking? Authorizing Provider  aspirin EC 81 MG tablet Take 1 tablet (81 mg total) by mouth daily. Take after 12 weeks for prevention of preeclampsia later in pregnancy 10/08/18   Anyanwu, Sallyanne Havers, MD  Blood Pressure KIT Monitor BP at home regularly extra large cuff O09.90 01/07/19   Truett Mainland, DO  clindamycin (CLINDAGEL) 1 % gel Apply topically 2 (two) times daily. Patient not taking: Reported on 02/19/2019 11/21/18   Seabron Spates, CNM  Elastic Bandages & Supports (COMFORT FIT MATERNITY SUPP SM) MISC 1 Units by Does not apply route daily as needed. 03/19/19   Nugent, Gerrie Nordmann, NP  miconazole (MONISTAT 7) 2 % vaginal cream Place 1 Applicatorful vaginally at bedtime. Apply for seven nights Patient not taking: Reported on 02/12/2019 01/07/19   Truett Mainland, DO  polyethylene glycol (MIRALAX) 17 g packet Take 17 g by mouth daily. As Directed Patient not taking: Reported on 02/19/2019  11/21/18   Seabron Spates, CNM  Prenat w/o A-FeCbn-Meth-FA-DHA (PRENATE MINI) 29-0.6-0.4-350 MG CAPS Take 1 capsule by mouth daily before breakfast. 09/18/18   Shelly Bombard, MD  simethicone (GAS-X) 80 MG chewable tablet Chew 1 tablet (80 mg total) by mouth every 6 (six) hours as needed for flatulence. Patient not taking: Reported on 02/19/2019 11/21/18   Seabron Spates, CNM  terconazole (TERAZOL 7) 0.4 % vaginal cream Place 1 applicator vaginally at bedtime. 03/14/19   Shelly Bombard, MD  valACYclovir (VALTREX) 1000 MG tablet Take 1,000 mg by mouth daily. 09/11/18   [provider]     Results for orders placed or performed during the hospital encounter of 03/19/19 (from the past 48 hour(s))  Urinalysis, Routine w reflex microscopic     Status:  None   Collection Time: 03/19/19  4:30 PM  Result Value Ref Range   Color, Urine YELLOW YELLOW   APPearance CLEAR CLEAR   Specific Gravity, Urine 1.023 1.005 - 1.030   pH 6.0 5.0 - 8.0   Glucose, UA NEGATIVE NEGATIVE mg/dL   Hgb urine dipstick NEGATIVE NEGATIVE   Bilirubin Urine NEGATIVE NEGATIVE   Ketones, ur NEGATIVE NEGATIVE mg/dL   Protein, ur NEGATIVE NEGATIVE mg/dL   Nitrite NEGATIVE NEGATIVE   Leukocytes,Ua NEGATIVE NEGATIVE    Comment: Performed at [facility] [address] [place], [place] [address]    No results found.  Review of Systems  Constitutional: Negative for weight loss.  HENT: Negative for ear discharge, ear pain, hearing loss and tinnitus.   Eyes: Negative for blurred vision, double vision, photophobia and pain.  Respiratory: Negative for cough, sputum production and shortness of breath.   Cardiovascular: Negative for chest pain (Left breast pain).  Gastrointestinal: Negative for abdominal pain, nausea and vomiting.  Genitourinary: Negative for dysuria, flank pain, frequency and urgency.  Musculoskeletal: Negative for back pain, falls, joint pain, myalgias and neck pain.  Neurological: Negative for dizziness, tingling, sensory change, focal weakness, loss of consciousness and headaches.  Endo/Heme/Allergies: Does not bruise/bleed easily.  Psychiatric/Behavioral: Negative for depression, memory loss and substance abuse. The patient is not nervous/anxious.    Blood pressure 118/66, pulse (!) 102, temperature 98.9 F (37.2 C), resp. rate 18, SpO2 100 %, unknown if currently breastfeeding. Physical Exam WDWN in NAD Left breast - healed axillary incisions with some chronic scarring Left lower inner quadrant - protruding 3 cm area of fluctuance with a surrounding 6 cm area of firmness.  Some overlying erythema.  Tenderness extends towards the nipple at 7:00.  No nipple deformity or discharge.  Assessment/Plan: Left breast abscess - probable extends at  least 6 cm and deep into the breast based on physical examination.  No imaging performed - not really indicated at this point  Recs:  Routine labs IV abx NPO p MN OR tomorrow morning for incision and drainage of left breast abscess under general anesthesia.  Dr. Ninfa Linden will discuss further with the patient tomorrow. Patient may be admitted by [facility] tonight for monitoring.  Possible discharge tomorrow after surgery, but will need assistance with wound care.  Imogene Burn Yexalen Deike 03/19/2019, 7:04 PM

## 2019-03-19 NOTE — H&P (Signed)
Ms. Kip Kautzman is a 33 y.o. [phone] at [redacted]w[redacted]d who presents for abdominal pain in LLQ. Pt does not have a pregnancy support belt at home. Pt reports intercourse in the past 24hrs.  Onset: 2hrs ago Location: LLQ of abdomen Duration: 2hrs Character: sharp, stabbing Aggravating/Associated: being upset, worse with movement/none Relieving: lying still Treatment: none Severity: 6/10  Pt also reports breast pain and swelling of the left breast for the past week. Pt reports she does have a history of hydratenitis and was receiving ABX for infections and steroid injections, but they stopped when she became pregnant. Pt reports the pain and swelling has gotten progressively worse over the past 7days.  Pt denies VB, LOF, ctx, decreased FM, vaginal discharge/odor/itching. Pt denies N/V, abdominal pain, constipation, diarrhea, or urinary problems. Pt denies fever, chills, fatigue, sweating or changes in appetite. Pt denies SOB or chest pain. Pt denies dizziness, HA, light-headedness, weakness.  Problems this pregnancy include: Rh negative. Allergies? latex Current medications/supplements? PNVs, bASA, Valtrex Prenatal care provider? [facility], next appt 03/20/2019.  OB History    Gravida  5   Para  3   Term  3   Preterm      AB  1   Living  3     SAB  1   TAB      Ectopic      Multiple  0   Live Births  3          Past Medical History:  Diagnosis Date  . Abnormal Pap smear   . Bipolar disorder ([facility])   . Chlamydia infection affecting pregnancy in first trimester 10/10/2018  . Depression    stopped meds, was wanting to see counselor  . Eczema   . Erb's palsy    L arm  . Erb's palsy    left arm  . Gonorrhea affecting pregnancy in first trimester 10/10/2018  . Herpes    frequent outbreaks on valtrex 500  . History of chlamydia   . 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 History of gonorrhea   . Hydradenitis   . Infection    UTI  . Kidney stones   .  Ovarian cyst   . Periodontal disease   . PID (pelvic inflammatory disease)   . PTSD (post-traumatic stress disorder)   . Trichomonas contact    Past Surgical History:  Procedure Laterality Date  . NO PAST SURGERIES     Family History: family history includes Asthma in her daughter and sister; Depression in her mother; Diabetes in her maternal grandmother and mother; Heart disease in her mother; Hypertension in her maternal grandmother and mother. Social History:  reports that she has quit smoking. Her smoking use included cigarettes. She has a 1.00 pack-year smoking history. She has never used smokeless tobacco. She reports that she does not drink alcohol or use drugs.     Maternal Diabetes: No Genetic Screening: Normal Maternal Ultrasounds/Referrals: Normal Fetal Ultrasounds or other Referrals:  None Maternal Substance Abuse:  No Significant Maternal Medications:  Meds include: Other: aspirin, Valtrex Significant Maternal Lab Results:  Rh negative Other Comments:  None  Review of Systems  Constitutional: Negative for chills and fever.  Respiratory: Negative for shortness of breath.   Cardiovascular: Negative for chest pain.  Gastrointestinal: Positive for abdominal pain. Negative for constipation, diarrhea, nausea and vomiting.  Neurological: Negative for dizziness and headaches.   Dilation: Closed Effacement (%): Thick Exam by:: N. Lamone Ferrelli, NP Blood pressure 121/63, pulse 98,  temperature 98.4 F (36.9 C), resp. rate 18, SpO2 100 %, unknown if currently breastfeeding. Exam Physical Exam  Constitutional: She is oriented to person, place, and time. She appears well-developed and well-nourished. No distress.  HENT:  Head: Normocephalic and atraumatic.  Respiratory: Effort normal.  GI: Soft. She exhibits no distension and no mass. There is no abdominal tenderness. There is no rebound and no guarding.  Neurological: She is alert and oriented to person, place, and time.  Skin:  Skin is warm and dry. She is not diaphoretic.  Psychiatric: She has a normal mood and affect. Her behavior is normal. Judgment and thought content normal.    Prenatal labs: ABO, Rh: --/--/PENDING (10/06 2020) Antibody: PENDING (10/06 2020) Rubella: 2.19 (04/27 0911) RPR: Non Reactive (09/08 0915)  HBsAg: Negative (04/27 0911)  HIV: Non Reactive (09/08 0915)  GBS:   none  Assessment/Plan:  -admit to [facility] for IV antibiotics per consultation with General Surgery and for drainage of abscess in the morning   Brecken Walth E Kenslee Achorn 03/19/2019, 9:00 PM

## 2019-03-19 NOTE — Plan of Care (Signed)
  Problem: Education: Goal: Knowledge of General Education information will improve Description: Including pain rating scale, medication(s)/side effects and non-pharmacologic comfort measures Outcome: Progressing   Problem: Health Behavior/Discharge Planning: Goal: Ability to manage health-related needs will improve Outcome: Progressing   Problem: Clinical Measurements: Goal: Ability to maintain clinical measurements within normal limits will improve Outcome: Progressing Goal: Will remain free from infection Outcome: Progressing  On iv abx, for I&D tomorrow

## 2019-03-19 NOTE — Telephone Encounter (Signed)
Pt called to office for recent lab results. Pt made aware of results and was tx at time of visit.

## 2019-03-19 NOTE — MAU Note (Signed)
Covid swab obtained without difficulty and pt tol well. No symptoms 

## 2019-03-19 NOTE — MAU Provider Note (Signed)
History     CSN: [id number]  Arrival date and time: 03/19/19 1535   First Provider Initiated Contact with Patient 03/19/19 1636      Chief Complaint  Patient presents with  . Abdominal Pain   [name] is a 33 y.o. [id number] at 28w4dwho presents to MAU for abdominal pain in LLQ. Pt does not have a pregnancy support belt at home. Pt reports intercourse in the past 24hrs.  Onset: 2hrs ago Location: LLQ of abdomen Duration: 2hrs Character: sharp, stabbing Aggravating/Associated: being upset, worse with movement/none Relieving: lying still Treatment: none Severity: 6/10  Pt also reports breast pain and swelling of the left breast for the past week. Pt reports she does have a history of hydratenitis and was receiving ABX for infections and steroid injections, but they stopped when she became pregnant. Pt reports the pain and swelling has gotten progressively worse over the past 7days.  Pt denies VB, LOF, ctx, decreased FM, vaginal discharge/odor/itching. Pt denies N/V, abdominal pain, constipation, diarrhea, or urinary problems. Pt denies fever, chills, fatigue, sweating or changes in appetite. Pt denies SOB or chest pain. Pt denies dizziness, HA, light-headedness, weakness.  Problems this pregnancy include: Rh negative. Allergies? latex Current medications/supplements? PNVs, bASA, Valtrex Prenatal care provider? [facility], next appt 03/20/2019   OB History    Gravida  5   Para  3   Term  3   Preterm      AB  1   Living  3     SAB  1   TAB      Ectopic      Multiple  0   Live Births  3           Past Medical History:  Diagnosis Date  . Abnormal Pap smear   . Bipolar disorder ([place]   . Chlamydia infection affecting pregnancy in first trimester 10/10/2018  . Depression    stopped meds, was wanting to see counselor  . Eczema   . Erb's palsy    L arm  . Erb's palsy    left arm  . Gonorrhea affecting pregnancy in first trimester 10/10/2018  .  Herpes    frequent outbreaks on valtrex 500  . History of chlamydia   . 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 History of gonorrhea   . Hydradenitis   . Infection    UTI  . Kidney stones   . Ovarian cyst   . Periodontal disease   . PID (pelvic inflammatory disease)   . PTSD (post-traumatic stress disorder)   . Trichomonas contact     Past Surgical History:  Procedure Laterality Date  . NO PAST SURGERIES      Family History  Problem Relation Age of Onset  . Depression Mother   . Diabetes Mother   . Hypertension Mother   . Heart disease Mother   . Asthma Sister   . Asthma Daughter   . Hypertension Maternal Grandmother   . Diabetes Maternal Grandmother     Social History   Tobacco Use  . Smoking status: Former Smoker    Packs/day: 0.50    Years: 2.00    Pack years: 1.00    Types: Cigarettes  . Smokeless tobacco: Never Used  Substance Use Topics  . Alcohol use: No    Comment: occ  . Drug use: No    Allergies:  Allergies  Allergen Reactions  . Latex     Medications Prior to Admission  Medication Sig Dispense Refill Last Dose  . aspirin EC 81 MG tablet Take 1 tablet (81 mg total) by mouth daily. Take after 12 weeks for prevention of preeclampsia later in pregnancy 300 tablet 2   . Blood Pressure KIT Monitor BP at home regularly extra large cuff O09.90 1 kit 0   . clindamycin (CLINDAGEL) 1 % gel Apply topically 2 (two) times daily. (Patient not taking: Reported on 02/19/2019) 30 g 0   . miconazole (MONISTAT 7) 2 % vaginal cream Place 1 Applicatorful vaginally at bedtime. Apply for seven nights (Patient not taking: Reported on 02/12/2019) 30 g 2   . polyethylene glycol (MIRALAX) 17 g packet Take 17 g by mouth daily. As Directed (Patient not taking: Reported on 02/19/2019) 14 each 3   . Prenat w/o A-FeCbn-Meth-FA-DHA (PRENATE MINI) 29-0.6-0.4-350 MG CAPS Take 1 capsule by mouth daily before breakfast. 30 capsule 5   . simethicone (GAS-X) 80 MG chewable tablet Chew  1 tablet (80 mg total) by mouth every 6 (six) hours as needed for flatulence. (Patient not taking: Reported on 02/19/2019) 30 tablet 0   . terconazole (TERAZOL 7) 0.4 % vaginal cream Place 1 applicator vaginally at bedtime. 45 g 2   . valACYclovir (VALTREX) 1000 MG tablet Take 1,000 mg by mouth daily.       Review of Systems  Constitutional: Negative for chills, diaphoresis, fatigue and fever.  Eyes: Negative for visual disturbance.  Respiratory: Negative for shortness of breath.   Cardiovascular: Negative for chest pain.  Gastrointestinal: Negative for abdominal pain, constipation, diarrhea, nausea and vomiting.  Genitourinary: Positive for pelvic pain. Negative for dysuria, flank pain, frequency, urgency, vaginal bleeding and vaginal discharge.  Neurological: Negative for dizziness, weakness, light-headedness and headaches.   Physical Exam   Blood pressure 121/63, pulse 98, temperature 98.4 F (36.9 C), resp. rate 18, SpO2 100 %, unknown if currently breastfeeding.  Patient Vitals for the past 24 hrs:  BP Temp Pulse Resp SpO2  03/19/19 2020 121/63 98.4 F (36.9 C) 98 18 100 %  03/19/19 1557 118/66 98.9 F (37.2 C) (!) 102 18 100 %   Physical Exam  Constitutional: She is oriented to person, place, and time. She appears well-developed and well-nourished. No distress.  HENT:  Head: Normocephalic and atraumatic.  Respiratory: Effort normal.    GI: Soft. She exhibits no distension and no mass. There is no abdominal tenderness. There is no rebound and no guarding.  Genitourinary:    Genitourinary Comments: Dilation: Closed Effacement (%): Thick Cervical Position: Posterior Exam by:: N. , NP   Neurological: She is alert and oriented to person, place, and time.  Skin: Skin is warm and dry. She is not diaphoretic.  Psychiatric: She has a normal mood and affect. Her behavior is normal. Judgment and thought content normal.   Results for orders placed or performed during the  hospital encounter of 03/19/19 (from the past 24 hour(s))  Urinalysis, Routine w reflex microscopic     Status: None   Collection Time: 03/19/19  4:30 PM  Result Value Ref Range   Color, Urine YELLOW YELLOW   APPearance CLEAR CLEAR   Specific Gravity, Urine 1.023 1.005 - 1.030   pH 6.0 5.0 - 8.0   Glucose, UA NEGATIVE NEGATIVE mg/dL   Hgb urine dipstick NEGATIVE NEGATIVE   Bilirubin Urine NEGATIVE NEGATIVE   Ketones, ur NEGATIVE NEGATIVE mg/dL   Protein, ur NEGATIVE NEGATIVE mg/dL   Nitrite NEGATIVE NEGATIVE   Leukocytes,Ua NEGATIVE NEGATIVE  CBC  Status: None   Collection Time: 03/19/19  7:35 PM  Result Value Ref Range   WBC 9.7 4.0 - 10.5 K/uL   RBC 4.55 3.87 - 5.11 MIL/uL   Hemoglobin 14.1 12.0 - 15.0 g/dL   HCT 40.6 36.0 - 46.0 %   MCV 89.2 80.0 - 100.0 fL   MCH 31.0 26.0 - 34.0 pg   MCHC 34.7 30.0 - 36.0 g/dL   RDW 13.6 11.5 - 15.5 %   Platelets 323 150 - 400 K/uL   nRBC 0.0 0.0 - 0.2 %  Type and screen [place]     Status: None (Preliminary result)   Collection Time: 03/19/19  8:20 PM  Result Value Ref Range   ABO/RH(D) PENDING    Antibody Screen PENDING    Sample Expiration      03/22/2019,2359 Performed at [facility], [place] [address], [place], [place] [address]    [place] [facility] Ob Follow Up  Result Date: 03/13/2019 ----------------------------------------------------------------------  OBSTETRICS REPORT                        (Signed Final 03/13/2019 05:09 am) ---------------------------------------------------------------------- Patient Info  ID #:       [id number]                          D.O.B.:  [birth year] (32 yrs)  Name:       KELSEY DURFLINGER                 Visit Date: 03/12/2019 01:54 pm ---------------------------------------------------------------------- Performed By  Performed By:     Valda Favia          Ref. Address:      [address]                                                               [place], [place]  Attending:        Sander Nephew      [facility]                    MD  Fetal [facility]  Referred By:      Osborne Oman MD ---------------------------------------------------------------------- Orders   #  Description                          Code         Ordered By   1  [place] [facility]FM OB FOLLOW UP                  66060.04     Sander Nephew  ----------------------------------------------------------------------   #  Order #                    Accession #                 Episode #   1  [id number]                  [id number]                  [id number]  ---------------------------------------------------------------------- Indications   [redacted] weeks gestation of pregnancy                Z3A.32   Low Risk NIPS   Obesity complicating pregnancy, third          O99.213   trimester(pregravid BMI 37)  ---------------------------------------------------------------------- Vital Signs                                                 Height:        5'6" ---------------------------------------------------------------------- Fetal Evaluation  Num Of Fetuses:          1  Fetal Heart Rate(bpm):   141  Cardiac Activity:        Observed  Presentation:            Cephalic  Placenta:                Posterior  P. Cord Insertion:       Previously Visualized  AFI Sum(cm)     %Tile       Largest Pocket(cm)  15.28           54          4.54  RUQ(cm)       RLQ(cm)       LUQ(cm)        LLQ(cm)  4.26          4.54          3.08           3.4 ---------------------------------------------------------------------- Biometry  BPD:      83.6  mm     G. Age:  33w 5d         74  %    CI:        74.51   %    70 - 86  FL/HC:        21.1  %    19.9 - 21.5  HC:      307.4  mm     G. Age:  34w 2d         58  %    HC/AC:       1.00       0.96 - 1.11  AC:      306.2  mm     G. Age:  34w 4d         94  %    FL/BPD:      77.6  %    71 - 87  FL:       64.9  mm     G. Age:  33w 3d         31  %    FL/AC:       21.2  %    20 - 24  HUM:      56.7  mm     G. Age:  33w 0d         42  %  Est. FW:    2360   gm     5 lb 3 oz     86  % ---------------------------------------------------------------------- OB History  Gravidity:    5         Term:   3        Prem:   0        SAB:   1  TOP:          0       Ectopic:  0        Living: 3 ---------------------------------------------------------------------- Gestational Age  U/S Today:     34w 0d                                        EDD:   04/23/19  Best:          32w 4d     Det. ByLoman Chroman         EDD:   05/03/19                                      (09/11/18) ---------------------------------------------------------------------- Anatomy  Cranium:               Appears normal         LVOT:                   Previously seen  Cavum:                 Previously seen        Aortic Arch:            Previously seen  Ventricles:            Appears normal         Ductal Arch:            Not well visualized  Choroid Plexus:        Previously seen        Diaphragm:              Appears normal  Cerebellum:            Previously seen  Stomach:                Appears normal, left                                                                        sided  Posterior Fossa:       Previously seen        Abdomen:                Previously seen  Nuchal Fold:           Not applicable (>03    Abdominal Wall:         Previously seen                         wks GA)  Face:                  Orbits and profile     Cord Vessels:           Previously seen                         previously seen  Lips:                  Appears normal         Kidneys:                Appear normal  Palate:                Not well visualized     Bladder:                Appears normal  Thoracic:              Appears normal         Spine:                  Previously seen  Heart:                 Appears normal         Upper Extremities:      Previously seen                         (4CH, axis, and                         situs)  RVOT:                  Previously seen        Lower Extremities:      Previously seen  Other:  Heels and  Left 5th digit previously visualized. Right hand not well          visualized. Fetus appears to be a female. Technically difficult due to          maternal habitus and fetal position. ---------------------------------------------------------------------- Cervix Uterus Adnexa  Cervix  Not visualized (advanced GA >24wks)  Uterus  No abnormality visualized.  Left Ovary  No adnexal mass visualized.  Right Ovary  No adnexal mass visualized.  Cul De Sac  No free  fluid seen.  Adnexa  No abnormality visualized. ---------------------------------------------------------------------- Impression  Normal interval growth.  Low risk NIPS ---------------------------------------------------------------------- Recommendations  Follow up as clinically indicated. ----------------------------------------------------------------------               Sander Nephew, MD Electronically Signed Final Report   03/13/2019 05:09 am ----------------------------------------------------------------------  MAU Course  Procedures  MDM -suspect round ligament pain -fFN not able to be performed d/t intercourse in the past 24hrs Dilation: Closed Effacement (%): Thick Cervical Position: Posterior Exam by:: N. , NP -Tylenol '1000mg'$  and Flexeril '10mg'$  given for pain, pt reports pain now 0/10 -RX for Flexeril to be sent upon discharge, if compatible with additional discharge medications  -areas of redness/tenderness on breast x2, single mass palpable at one site just beneath skin, about 5-6cm -called spoke with Dr. Nehemiah Settle '@[phone]'$ , Dr. Nehemiah Settle recommends  consultation with general surgery. -called and spoke with Dr. Gershon Crane '@[phone]'$ . Dr. Gershon Crane to MAU to evaluate patient in person. Per Dr. Gershon Crane, since patient last ate about 3.5hrs ago, will need to stay overnight in hospital, will get IV antibiotics and will perform I&D under general sedation in AM. -EFM: reactive       -baseline: 145       -variability: moderate       -accels: present, 15x15       -decels: absent       -TOCO: irritability -pt admitted to [facility] for overnight observation for morning surgery, Dr. Nehemiah Settle made aware  Orders Placed This Encounter  Procedures  . SARS CORONAVIRUS 2 (TAT 6-24 HRS) Nasopharyngeal Nasopharyngeal Swab    Standing Status:   Standing    Number of Occurrences:   1    Order Specific Question:   Is this test for diagnosis or screening    Answer:   Screening    Order Specific Question:   Symptomatic for COVID-19 as defined by CDC    Answer:   No    Order Specific Question:   Hospitalized for COVID-19    Answer:   No    Order Specific Question:   Admitted to ICU for COVID-19    Answer:   No    Order Specific Question:   Previously tested for COVID-19    Answer:   No    Order Specific Question:   Resident in a congregate (group) care setting    Answer:   No    Order Specific Question:   Employed in healthcare setting    Answer:   No    Order Specific Question:   Pregnant    Answer:   Yes  . Urinalysis, Routine w reflex microscopic    Standing Status:   Standing    Number of Occurrences:   1  . CBC    Standing Status:   Standing    Number of Occurrences:   1  . Basic metabolic panel    Standing Status:   Standing    Number of Occurrences:   1  . Diet regular Room service appropriate? Yes; Fluid consistency: Thin    Standing Status:   Standing    Number of Occurrences:   1    Order Specific Question:   Room service appropriate?    Answer:   Yes    Order Specific Question:   Fluid consistency:    Answer:   Thin  . Informed Consent  Details: Physician/Practitioner Attestation; Transcribe to consent form and obtain patient signature    Surgery will be performed tomorrow  10/7 by Dr. Ninfa Linden    Standing Status:   Standing    Number of Occurrences:   1    Order Specific Question:   Physician/Practitioner attestation of informed consent for procedure/surgical case    Answer:   I, the physician/practitioner, attest that I have discussed with the patient the benefits, risks, side effects, alternatives, likelihood of achieving goals and potential problems during recovery for the procedure that I have provided informed consent.    Order Specific Question:   Procedure    Answer:   Incision and drainage of left breast abscess    Order Specific Question:   Physician/Practitioner performing the procedure    Answer:   Dr. Nedra Hai    Order Specific Question:   Indication/Reason    Answer:   Left breast abscess  . Notify physician (specify)    Standing Status:   Standing    Number of Occurrences:   20    Order Specific Question:   Notify Physician    Answer:   for pulse less than 60 or greater than 120    Order Specific Question:   Notify Physician    Answer:   for respiratory rate less than 12 or greater than 28    Order Specific Question:   Notify Physician    Answer:   for temperature greater than 100.4    Order Specific Question:   Notify Physician    Answer:   for urinary output less than 30 ml/hr    Order Specific Question:   Notify Physician    Answer:   for systolic BP less than 80 or greater than 140    Order Specific Question:   Notify Physician    Answer:   for diastolic BP less than 40 or greater than 90  . Vital signs    While awake, respect sleep.    Standing Status:   Standing    Number of Occurrences:   1  . Defer vaginal exam for vaginal bleeding or PROM <37 weeks    Standing Status:   Standing    Number of Occurrences:   1  . Initiate Oral Care Protocol    Standing Status:   Standing    Number of  Occurrences:   1  . Initiate Carrier Fluid Protocol    Standing Status:   Standing    Number of Occurrences:   1  . Full code    Standing Status:   Standing    Number of Occurrences:   1  . piperacillin-tazobactam (ZOSYN) per pharmacy consult    Standing Status:   Standing    Number of Occurrences:   1    Order Specific Question:   Antibiotic Indication:    Answer:   Cellulitis    Order Specific Question:   Other Indication:    Answer:   per gen surgery  . Type and screen [facility]     Standing Status:   Standing    Number of Occurrences:   1  . Fetal nonstress test    Standing Status:   Standing    Number of Occurrences:   9  . Place in observation (patient's expected length of stay will be less than 2 midnights)    Standing Status:   Standing    Number of Occurrences:   1    Order Specific Question:   Hospital Area    Answer:   [facility]  [place] [[id number]]    Order Specific Question:   Level of Care    Answer:   Antepartum [20]    Order Specific Question:   Covid Evaluation    Answer:   Asymptomatic Screening Protocol (No Symptoms)    Order Specific Question:   Diagnosis    Answer:   Breast abscess [701779]    Order Specific Question:   Admitting Physician    Answer:   Truett Mainland [[id number]]    Order Specific Question:   Attending Physician    Answer:   Truett Mainland [[id number]]    Order Specific Question:   PT Class (Do Not Modify)    Answer:   Observation [104]    Order Specific Question:   PT Acc Code (Do Not Modify)    Answer:   Observation [10022]   Meds ordered this encounter  Medications  . acetaminophen (TYLENOL) tablet 1,000 mg  . cyclobenzaprine (FLEXERIL) tablet 10 mg  . Elastic Bandages & Supports (COMFORT FIT MATERNITY SUPP SM) MISC    Sig: 1 Units by Does not apply route daily as needed.    Dispense:  1 each    Refill:  0    Order Specific Question:   Supervising Provider    Answer:   Sloan Leiter [[id number]]  . lactated ringers infusion  . acetaminophen (TYLENOL) tablet 650 mg  . zolpidem (AMBIEN) tablet 5 mg  . docusate sodium (COLACE) capsule 100 mg  . calcium carbonate (TUMS - dosed in mg elemental calcium) chewable tablet 400 mg of elemental calcium  . prenatal multivitamin tablet 1 tablet  . piperacillin-tazobactam (ZOSYN) IVPB 3.375 g    Order Specific Question:   Antibiotic Indication:    Answer:   Cellulitis   Assessment and Plan   1. Breast abscess, needing surgical drainage/removal 2. Round ligament pain 3. 33weeks of pregnancy 4. Reactive NST  -RX pregnancy belt  Gerrie Nordmann  03/19/2019, 8:53 PM

## 2019-03-19 NOTE — MAU Note (Addendum)
Pt presents to MAU with c/o sharp pains in her abdomen mainly on left side that is shooting to left leg that started at 1400 today after argument with husband. PT states that pain is worse with movement. Pt denies VB and LOF. +FM

## 2019-03-20 ENCOUNTER — Encounter (HOSPITAL_COMMUNITY)
Admission: AD | Disposition: A | Discharge status: Home / Self Care | Payer: Self-pay | Source: Home / Self Care | Attending: Family Medicine

## 2019-03-20 ENCOUNTER — Observation Stay (HOSPITAL_COMMUNITY): Payer: Medicaid Other | Admitting: Certified Registered Nurse Anesthetist

## 2019-03-20 DIAGNOSIS — Z7982 Long term (current) use of aspirin: Secondary | ICD-10-CM | POA: Diagnosis not present

## 2019-03-20 DIAGNOSIS — Z20828 Contact with and (suspected) exposure to other viral communicable diseases: Secondary | ICD-10-CM | POA: Diagnosis not present

## 2019-03-20 DIAGNOSIS — O91113 Abscess of breast associated with pregnancy, third trimester: Secondary | ICD-10-CM | POA: Diagnosis not present

## 2019-03-20 DIAGNOSIS — Z3A33 33 weeks gestation of pregnancy: Secondary | ICD-10-CM | POA: Diagnosis not present

## 2019-03-20 HISTORY — PX: IRRIGATION AND DEBRIDEMENT ABSCESS: SHX5252

## 2019-03-20 LAB — SARS CORONAVIRUS 2 (TAT 6-24 HRS): SARS Coronavirus 2: NEGATIVE

## 2019-03-20 SURGERY — IRRIGATION AND DEBRIDEMENT ABSCESS
Anesthesia: General | Site: Breast | Laterality: Left

## 2019-03-20 MED ORDER — MIDAZOLAM HCL 2 MG/2ML IJ SOLN
INTRAMUSCULAR | Status: AC
  Filled 2019-03-20: qty 2

## 2019-03-20 MED ORDER — DEXAMETHASONE SODIUM PHOSPHATE 10 MG/ML IJ SOLN
INTRAMUSCULAR | Status: AC
  Filled 2019-03-20: qty 1

## 2019-03-20 MED ORDER — FENTANYL CITRATE (PF) 250 MCG/5ML IJ SOLN
INTRAMUSCULAR | Status: AC
  Filled 2019-03-20: qty 5

## 2019-03-20 MED ORDER — PROPOFOL 10 MG/ML IV BOLUS
INTRAVENOUS | Status: DC | PRN
  Administered 2019-03-20: 120 mg via INTRAVENOUS
  Administered 2019-03-20: 30 mg via INTRAVENOUS

## 2019-03-20 MED ORDER — HYDROMORPHONE HCL 1 MG/ML IJ SOLN: 0.2500 mg | INTRAMUSCULAR | Status: DC | PRN

## 2019-03-20 MED ORDER — SUCCINYLCHOLINE CHLORIDE 20 MG/ML IJ SOLN
INTRAMUSCULAR | Status: DC | PRN
  Administered 2019-03-20: 120 mg via INTRAVENOUS

## 2019-03-20 MED ORDER — LIDOCAINE 2% (20 MG/ML) 5 ML SYRINGE
INTRAMUSCULAR | Status: DC | PRN
  Administered 2019-03-20: 40 mg via INTRAVENOUS

## 2019-03-20 MED ORDER — LIDOCAINE 2% (20 MG/ML) 5 ML SYRINGE
INTRAMUSCULAR | Status: AC
  Filled 2019-03-20: qty 5

## 2019-03-20 MED ORDER — HYDROCODONE-ACETAMINOPHEN 5-325 MG PO TABS
1.0000 | ORAL_TABLET | ORAL | Status: DC | PRN
  Administered 2019-03-20 – 2019-03-21 (×5): 2 via ORAL
  Filled 2019-03-20 (×5): qty 2

## 2019-03-20 MED ORDER — 0.9 % SODIUM CHLORIDE (POUR BTL) OPTIME
TOPICAL | Status: DC | PRN
  Administered 2019-03-20: 1000 mL

## 2019-03-20 MED ORDER — BUPIVACAINE-EPINEPHRINE 0.5% -1:200000 IJ SOLN
INTRAMUSCULAR | Status: DC | PRN
  Administered 2019-03-20: 15 mL

## 2019-03-20 MED ORDER — SODIUM CHLORIDE 0.9 % IV SOLN
INTRAVENOUS | Status: DC | PRN
  Administered 2019-03-20: 500 mL via INTRAVENOUS

## 2019-03-20 MED ORDER — LACTATED RINGERS IV SOLN
INTRAVENOUS | Status: DC | PRN
  Administered 2019-03-20: 11:00:00 via INTRAVENOUS

## 2019-03-20 MED ORDER — FENTANYL CITRATE (PF) 250 MCG/5ML IJ SOLN
INTRAMUSCULAR | Status: DC | PRN
  Administered 2019-03-20: 25 ug via INTRAVENOUS
  Administered 2019-03-20: 100 ug via INTRAVENOUS
  Administered 2019-03-20: 50 ug via INTRAVENOUS

## 2019-03-20 MED ORDER — ONDANSETRON HCL 4 MG/2ML IJ SOLN
INTRAMUSCULAR | Status: AC
  Filled 2019-03-20: qty 2

## 2019-03-20 MED ORDER — BUPIVACAINE-EPINEPHRINE (PF) 0.5% -1:200000 IJ SOLN
INTRAMUSCULAR | Status: AC
  Filled 2019-03-20: qty 30

## 2019-03-20 SURGICAL SUPPLY — 28 items
BNDG GAUZE ELAST 4 BULKY (GAUZE/BANDAGES/DRESSINGS) ×2 IMPLANT
CANISTER SUCT 3000ML PPV (MISCELLANEOUS) ×3 IMPLANT
COVER SURGICAL LIGHT HANDLE (MISCELLANEOUS) ×3 IMPLANT
COVER WAND RF STERILE (DRAPES) ×1 IMPLANT
DRAPE CHEST BREAST 15X10 FENES (DRAPES) ×2 IMPLANT
DRAPE LAPAROSCOPIC ABDOMINAL (DRAPES) ×2 IMPLANT
DRAPE LAPAROTOMY 100X72 PEDS (DRAPES) IMPLANT
DRSG PAD ABDOMINAL 8X10 ST (GAUZE/BANDAGES/DRESSINGS) ×2 IMPLANT
ELECT REM PT RETURN 9FT ADLT (ELECTROSURGICAL) ×3
ELECTRODE REM PT RTRN 9FT ADLT (ELECTROSURGICAL) ×1 IMPLANT
GAUZE SPONGE 4X4 12PLY STRL (GAUZE/BANDAGES/DRESSINGS) ×2 IMPLANT
GLOVE BIOGEL PI IND STRL 8 (GLOVE) ×1 IMPLANT
GLOVE BIOGEL PI INDICATOR 8 (GLOVE)
GLOVE ECLIPSE 8.0 STRL XLNG CF (GLOVE) ×1 IMPLANT
GOWN STRL REUS W/ TWL LRG LVL3 (GOWN DISPOSABLE) ×2 IMPLANT
GOWN STRL REUS W/TWL LRG LVL3 (GOWN DISPOSABLE) ×6
KIT BASIN OR (CUSTOM PROCEDURE TRAY) ×3 IMPLANT
KIT TURNOVER KIT B (KITS) ×3 IMPLANT
NS IRRIG 1000ML POUR BTL (IV SOLUTION) ×3 IMPLANT
PACK GENERAL/GYN (CUSTOM PROCEDURE TRAY) ×3 IMPLANT
PAD ARMBOARD 7.5X6 YLW CONV (MISCELLANEOUS) ×3 IMPLANT
PENCIL SMOKE EVACUATOR (MISCELLANEOUS) ×3 IMPLANT
SWAB COLLECTION DEVICE MRSA (MISCELLANEOUS) IMPLANT
SWAB CULTURE ESWAB REG 1ML (MISCELLANEOUS) IMPLANT
SWAB CULTURE LIQ STUART DBL (MISCELLANEOUS) ×2 IMPLANT
SWAB CULTURE LIQUID MINI MALE (MISCELLANEOUS) ×2 IMPLANT
TOWEL GREEN STERILE (TOWEL DISPOSABLE) ×3 IMPLANT
TOWEL GREEN STERILE FF (TOWEL DISPOSABLE) ×3 IMPLANT

## 2019-03-20 NOTE — Anesthesia Postprocedure Evaluation (Signed)
Anesthesia Post Note  Patient: Diamond Burton  Procedure(s) Performed: IRRIGATION AND DEBRIDEMENT BREAST ABSCESS (Left Breast)     Patient location during evaluation: PACU Anesthesia Type: General Level of consciousness: awake and alert Pain management: pain level controlled Vital Signs Assessment: post-procedure vital signs reviewed and stable Respiratory status: spontaneous breathing, nonlabored ventilation and respiratory function stable Cardiovascular status: blood pressure returned to baseline and stable Postop Assessment: no apparent nausea or vomiting Anesthetic complications: no    Last Vitals:  Vitals:   03/20/19 1200 03/20/19 1215  BP: 110/82 123/72  Pulse: (!) 107 95  Resp: (!) 21 17  Temp: 36.5 C   SpO2: 98% 99%    Last Pain:  Vitals:   03/20/19 1215  TempSrc:   PainSc: 0-No pain                 Loyalty Brashier,W. EDMOND

## 2019-03-20 NOTE — Progress Notes (Signed)
Subjective: HD#1 for breast abscess. Here for IV antibiotics and surgical management. Patient NPO after midnight. Surgery to arrange drainage. Denies fevers, chills, decreased fetal movement, contractions, bleeding, leaking fluid.  Objective: BP 129/63 (BP Location: Right Arm)   Pulse 76   Temp 97.7 F (36.5 C) (Oral)   Resp 18   SpO2 98%    General: alert, cooperative and no distress GI: soft, non-tender; bowel sounds normal; no masses,  no organomegaly and fundal height appropriate for GA. Extremities: extremities normal, atraumatic, no cyanosis or edema and Homans sign is negative, no sign of DVT   Assessment/Plan: Continue antibiotics.  Surgical management per surgery.  Discharge per surgery.   LOS: 1 day    Truett Mainland 03/20/2019, 7:00 AM

## 2019-03-20 NOTE — Plan of Care (Signed)
  Problem: Clinical Measurements: Goal: Ability to maintain clinical measurements within normal limits will improve Outcome: Progressing Goal: Will remain free from infection Outcome: Progressing   

## 2019-03-20 NOTE — Transfer of Care (Signed)
Immediate Anesthesia Transfer of Care Note  Patient: Diamond Burton  Procedure(s) Performed: IRRIGATION AND DEBRIDEMENT BREAST ABSCESS (Left Breast)  Patient Location: PACU  Anesthesia Type:General  Level of Consciousness: awake  Airway & Oxygen Therapy: Patient Spontanous Breathing  Post-op Assessment: Report given to RN and Post -op Vital signs reviewed and stable  Post vital signs: Reviewed and stable  Last Vitals:  Vitals Value Taken Time  BP 110/82 03/20/19 1200  Temp 36.5 C 03/20/19 1200  Pulse 118 03/20/19 1202  Resp 26 03/20/19 1202  SpO2 98 % 03/20/19 1202  Vitals shown include unvalidated device data.  Last Pain:  Vitals:   03/20/19 1200  TempSrc:   PainSc: 0-No pain      Patients Stated Pain Goal: 3 (50/09/38 1829)  Complications: No apparent anesthesia complications

## 2019-03-20 NOTE — Anesthesia Preprocedure Evaluation (Addendum)
Anesthesia Evaluation  Patient identified by MRN, date of birth, ID band Patient awake    Reviewed: Allergy & Precautions, H&P , NPO status , Patient's Chart, lab work & pertinent test results  Airway Mallampati: II  TM Distance: >3 FB Neck ROM: Full    Dental no notable dental hx. (+) Teeth Intact, Dental Advisory Given   Pulmonary neg pulmonary ROS, Patient abstained from smoking., former smoker,    Pulmonary exam normal breath sounds clear to auscultation       Cardiovascular negative cardio ROS   Rhythm:Regular Rate:Normal     Neuro/Psych Anxiety Depression Bipolar Disorder negative neurological ROS     GI/Hepatic negative GI ROS, Neg liver ROS,   Endo/Other  negative endocrine ROS  Renal/GU negative Renal ROS  negative genitourinary   Musculoskeletal   Abdominal   Peds  Hematology negative hematology ROS (+)   Anesthesia Other Findings   Reproductive/Obstetrics negative OB ROS                            Anesthesia Physical Anesthesia Plan  ASA: II  Anesthesia Plan: General   Post-op Pain Management:    Induction: Intravenous, Rapid sequence and Cricoid pressure planned  PONV Risk Score and Plan: 4 or greater and Ondansetron, Dexamethasone and Midazolam  Airway Management Planned: Oral ETT  Additional Equipment:   Intra-op Plan:   Post-operative Plan: Extubation in OR  Informed Consent: I have reviewed the patients History and Physical, chart, labs and discussed the procedure including the risks, benefits and alternatives for the proposed anesthesia with the patient or authorized representative who has indicated his/her understanding and acceptance.     Dental advisory given  Plan Discussed with: CRNA  Anesthesia Plan Comments:         Anesthesia Quick Evaluation

## 2019-03-20 NOTE — Anesthesia Procedure Notes (Signed)
Procedure Name: Intubation Performed by: Milford Cage, CRNA Pre-anesthesia Checklist: Patient identified, Emergency Drugs available, Suction available and Patient being monitored Patient Re-evaluated:Patient Re-evaluated prior to induction Oxygen Delivery Method: Circle System Utilized Preoxygenation: Pre-oxygenation with 100% oxygen Induction Type: IV induction, Rapid sequence and Cricoid Pressure applied Laryngoscope Size: Glidescope and 3 Grade View: Grade II Tube type: Oral Tube size: 7.5 mm Number of attempts: 1 Airway Equipment and Method: Stylet and Oral airway Placement Confirmation: ETT inserted through vocal cords under direct vision,  positive ETCO2 and breath sounds checked- equal and bilateral Secured at: 22 cm Tube secured with: Tape Dental Injury: Teeth and Oropharynx as per pre-operative assessment  Comments: Glidescope d/t pregnancy

## 2019-03-20 NOTE — Progress Notes (Signed)
Patient ID: Evora Schechter, female   DOB: 04-May-1986, 33 y.o.   MRN: [id number]  Pre Procedure note for inpatients:   Betti Chatterjee has been scheduled for INCISION AND DRAINAGE OF A LEFT BREAST ABSCESS today. The various methods of treatment have been discussed with the patient. After consideration of the risks, benefits and treatment options the patient has consented to the planned procedure.   The patient has been seen and labs reviewed. There are no changes in the patient's condition to prevent proceeding with the planned procedure today.  Recent labs:  Lab Results  Component Value Date   WBC 9.7 03/19/2019   HGB 14.1 03/19/2019   HCT 40.6 03/19/2019   PLT 323 03/19/2019   GLUCOSE 75 03/19/2019   ALT 16 11/21/2018   AST 14 (L) 11/21/2018   NA 134 (L) 03/19/2019   K 3.6 03/19/2019   CL 104 03/19/2019   CREATININE 0.54 03/19/2019   BUN 7 03/19/2019   CO2 19 (L) 03/19/2019   HGBA1C 5.6 10/08/2018    Coralie Keens, MD 03/20/2019 7:34 AM

## 2019-03-20 NOTE — Op Note (Signed)
   Diamond Burton 03/20/2019   Pre-op Diagnosis: left breast abscess     Post-op Diagnosis: same  Procedure(s): INCISION AND DRAINAGE LEFT BREAST ABSCESS  Surgeon(s): Coralie Keens, MD  Anesthesia: General  Staff:  Circulator: Carlynn Purl, RN Scrub Person: Rolan Bucco Circulator Assistant: Cyd Silence, RN  Estimated Blood Loss: Minimal               CULTURES SENT  Procedure: The patient was brought to the operating room and identifies correct patient.  She is placed upon the operating table and general anesthesia was induced.  Fetal monitoring was performed the entire procedure.  Her left breast was prepped and draped in usual sterile fashion.  The abscess was located in the lower inner quadrant near the inframammary ridge.  I made incision with a scalpel and entered a large abscess cavity.  Cultures were obtained.  All purulence was removed.  The underlying tissue and inflammation appeared consistent with hidradenitis.  I anesthetized the incision and surrounding tissue with Marcaine.  The wound was irrigated with saline.  Hemostasis was achieved with the cautery.  I then packed the wound with a 4 x 4 saline soaked gauze.  Dry gauze was placed over the top.  The patient tolerated the procedure well.  All the counts were correct at the end of the procedure.  The patient was then extubated in the operating room and taken in a stable condition to the recovery room.          Coralie Keens   Date: 03/20/2019  Time: 11:44 AM

## 2019-03-20 NOTE — Progress Notes (Signed)
Dr Rosana Hoes notified that pts procedure went well and that she is about to be moved back to [facility] specialty care.  MD made aware that fhr is reassuring not reactive at this time and that pt had 1 uc while on monitor. Dr Rosana Hoes says pt may be removed from fhr monitoring at this time.

## 2019-03-21 ENCOUNTER — Encounter (HOSPITAL_COMMUNITY): Payer: Self-pay | Admitting: Surgery

## 2019-03-21 DIAGNOSIS — O91113 Abscess of breast associated with pregnancy, third trimester: Secondary | ICD-10-CM | POA: Diagnosis not present

## 2019-03-21 MED ORDER — OXYCODONE HCL 5 MG PO TABS: 5.0000 mg | ORAL_TABLET | Freq: Four times a day (QID) | ORAL | 0 refills | Status: DC | PRN

## 2019-03-21 MED ORDER — AMOXICILLIN-POT CLAVULANATE 875-125 MG PO TABS: 1.0000 | ORAL_TABLET | Freq: Two times a day (BID) | ORAL | 0 refills | Status: DC

## 2019-03-21 NOTE — Progress Notes (Signed)
1 Day Post-Op    CC: Left breast abscess  Subjective: Patient is awake and alert.  We took her dressing down and remove the packing.  This was pretty uncomfortable for the patient.  Site was clean with good granular base.  We repacked the site with an open portion of a 4 x 4.  I explained the dressing change to the patient and her significant other who is also in the room with her.  Objective: Vital signs in last 24 hours: Temp:  [97.6 F (36.4 C)-98.3 F (36.8 C)] 98.2 F (36.8 C) (10/08 0627) Pulse Rate:  [72-107] 89 (10/08 0627) Resp:  [17-21] 18 (10/08 0627) BP: (97-140)/(47-84) 117/72 (10/08 0627) SpO2:  [98 %-100 %] 100 % (10/08 0627)  480 p.o. 675 IV recorded Urine 600 Emesis x1 Afebrile vital signs are stable No labs this a.m.   Intake/Output from previous day: 10/07 0701 - 10/08 0700 In: 1155.1 [P.O.:480; I.V.:500; IV Piggyback:175.1] Out: 800 [Urine:600; Emesis/NG output:200] Intake/Output this shift: No intake/output data recorded.  General appearance: alert, cooperative and no distress Breasts: normal appearance, no masses or tenderness, About a 2 cm open site base of the left breast.  Clean with good granular base.  No bleeding or exudate.  Lab Results:  Recent Labs    03/19/19 1935  WBC 9.7  HGB 14.1  HCT 40.6  PLT 323    BMET Recent Labs    03/19/19 1935  NA 134*  K 3.6  CL 104  CO2 19*  GLUCOSE 75  BUN 7  CREATININE 0.54  CALCIUM 9.4   PT/INR No results for input(s): LABPROT, INR in the last 72 hours.  No results for input(s): AST, ALT, ALKPHOS, BILITOT, PROT, ALBUMIN in the last 168 hours.   Lipase     Component Value Date/Time   LIPASE 35 11/21/2018 0134   . sodium chloride Stopped (03/20/19 0900)  . lactated ringers 125 mL/hr at 03/20/19 0900  . piperacillin-tazobactam (ZOSYN)  IV 3.375 g (03/21/19 0433)     Medications: . aspirin EC  81 mg Oral Daily  . docusate sodium  100 mg Oral Daily  . polyethylene glycol  17 g Oral  Daily  . prenatal multivitamin  1 tablet Oral Q1200  . valACYclovir  1,000 mg Oral Daily    Assessment/Plan [redacted] weeks gestation Hx bipolar disorder/depression Hx PTSD    Left breast abscess Incision drainage left breast abscess, 03/20/2027 Dr. Coralie Burton  FEN: IV fluids/regular diet ID: Zosyn 10/6 >> day 3 - recommend 10 days of antibiotic coverage DVT:  SCD Follow up:  Dr. Ninfa Burton  Plan: Patient can go home on wet-to-dry dressings.  I explained this to the patient and her husband in detail.  He was present and watch me do the dressing change.  I told the patient she could get in the shower and remove the dressing, allow soap and water to get into the open site.  Then redress after her shower.  We recommended 10 days of antibiotic coverage.  Dressing changes at least daily and twice daily if possible.  I will get her a follow-up appointment with Dr. Ninfa Burton in about 3 weeks.  I recommended Tylenol for pain.  Will defer pain medication antibiotics to Dr. Nehemiah Burton.     LOS: 1 day    Diamond Burton 03/21/2019 [phone]

## 2019-03-21 NOTE — Discharge Summary (Signed)
Antenatal Physician Discharge Summary  Patient ID: Diamond Burton MRN: [id number] DOB/AGE: 10/05/1985 33 y.o.  Admit date: 03/19/2019 Discharge date: 03/21/2019  Admission Diagnoses: breast abscess  Discharge Diagnoses:  Active Problems:   Hydradenitis   Rh negative state in antepartum period   Obesity in pregnancy, antepartum   Supervision of high-risk pregnancy   Breast abscess   Prenatal Procedures: NST  Consults: [facility] Course:  This is a 33 y.o. [name] with IUP at 64w6dadmitted for breast abscess. She was taken for I&D by general surgery on HD#2. Patient tolerated well. Received IV antibiotics post drainage and was discharged with 10 days of antibiotics total. She had reassuring fetal monitoring with no obstetric complaints.  She was discharged to home HD#3 with instructions for follow up. Keep appointments with general surgery and [facility].   Discharge Exam: Temp:  [97.6 F (36.4 C)-98.3 F (36.8 C)] 98.3 F (36.8 C) (10/08 0758) Pulse Rate:  [72-107] 73 (10/08 0758) Resp:  [17-21] 18 (10/08 0758) BP: (97-140)/(47-84) 128/65 (10/08 0758) SpO2:  [98 %-100 %] 99 % (10/08 0758) Physical Examination: CONSTITUTIONAL: Well-developed, well-nourished female in no acute distress.  HENT:  Normocephalic, atraumatic, External right and left ear normal. Oropharynx is clear and moist EYES: Conjunctivae and EOM are normal. Pupils are equal, round, and reactive to light. No scleral icterus.  NECK: Normal range of motion, supple, no masses SKIN: Skin is warm and dry. No rash noted. Not diaphoretic. No erythema. No pallor. NPowderly Alert and oriented to person, place, and time. Normal reflexes, muscle tone coordination. No cranial nerve deficit noted. PSYCHIATRIC: Normal mood and affect. Normal behavior. Normal judgment and thought content. CARDIOVASCULAR: Normal heart rate noted RESPIRATORY: Effort normal, no problems with respiration  noted MUSCULOSKELETAL: Normal range of motion. No edema and no tenderness. 2+ distal pulses. ABDOMEN: Soft, nontender, nondistended, gravid. CERVIX: Dilation: Closed Effacement (%): Thick Cervical Position: Posterior Exam by:: N. Nugent, NP  Fetal monitoring: FHR: 125 bpm, Variability: moderate, Accelerations: Present, Decelerations: Absent  Uterine activity: no contractions per hour  Significant Diagnostic Studies:  Results for orders placed or performed during the hospital encounter of 03/19/19 (from the past 168 hour(s))  Urinalysis, Routine w reflex microscopic   Collection Time: 03/19/19  4:30 PM  Result Value Ref Range   Color, Urine YELLOW YELLOW   APPearance CLEAR CLEAR   Specific Gravity, Urine 1.023 1.005 - 1.030   pH 6.0 5.0 - 8.0   Glucose, UA NEGATIVE NEGATIVE mg/dL   Hgb urine dipstick NEGATIVE NEGATIVE   Bilirubin Urine NEGATIVE NEGATIVE   Ketones, ur NEGATIVE NEGATIVE mg/dL   Protein, ur NEGATIVE NEGATIVE mg/dL   Nitrite NEGATIVE NEGATIVE   Leukocytes,Ua NEGATIVE NEGATIVE  CBC   Collection Time: 03/19/19  7:35 PM  Result Value Ref Range   WBC 9.7 4.0 - 10.5 K/uL   RBC 4.55 3.87 - 5.11 MIL/uL   Hemoglobin 14.1 12.0 - 15.0 g/dL   HCT 40.6 36.0 - 46.0 %   MCV 89.2 80.0 - 100.0 fL   MCH 31.0 26.0 - 34.0 pg   MCHC 34.7 30.0 - 36.0 g/dL   RDW 13.6 11.5 - 15.5 %   Platelets 323 150 - 400 K/uL   nRBC 0.0 0.0 - 0.2 %  Basic metabolic panel   Collection Time: 03/19/19  7:35 PM  Result Value Ref Range   Sodium 134 (L) 135 - 145 mmol/L   Potassium 3.6 3.5 - 5.1 mmol/L   Chloride 104 98 - 111 mmol/L  CO2 19 (L) 22 - 32 mmol/L   Glucose, Bld 75 70 - 99 mg/dL   BUN 7 6 - 20 mg/dL   Creatinine, Ser 0.54 0.44 - 1.00 mg/dL   Calcium 9.4 8.9 - 10.3 mg/dL   GFR calc non Af Amer >60 >60 mL/min   GFR calc Af Amer >60 >60 mL/min   Anion gap 11 5 - 15  SARS CORONAVIRUS 2 (TAT 6-24 HRS) Nasopharyngeal Nasopharyngeal Swab   Collection Time: 03/19/19  7:54 PM   Specimen:  Nasopharyngeal Swab  Result Value Ref Range   SARS Coronavirus 2 NEGATIVE NEGATIVE  Type and screen [facility]   Collection Time: 03/19/19  8:20 PM  Result Value Ref Range   ABO/RH(D) B NEG    Antibody Screen POS    Sample Expiration 03/22/2019,2359    Antibody Identification      PASSIVELY ACQUIRED ANTI-D Performed at [facility], [place] [address] [place], [place] [address]    Unit Number [id number]    Blood Component Type RBC LR PHER1    Unit division 00    Status of Unit ALLOCATED    Transfusion Status OK TO TRANSFUSE    Crossmatch Result COMPATIBLE    Unit Number [id number]    Blood Component Type RED CELLS,LR    Unit division 00    Status of Unit ALLOCATED    Transfusion Status OK TO TRANSFUSE    Crossmatch Result COMPATIBLE   BPAM RBC   Collection Time: 03/19/19  8:20 PM  Result Value Ref Range   Blood Product Unit Number [id number]    PRODUCT CODE C1448J85    Unit Type and Rh 9500    Blood Product Expiration Date [id number]    Blood Product Unit Number [id number]    PRODUCT CODE O6767M09    Unit Type and Rh 9500    Blood Product Expiration Date [id number]   Aerobic/Anaerobic Culture (surgical/deep wound)   Collection Time: 03/20/19 11:27 AM   Specimen: PATH Other; Tissue  Result Value Ref Range   Specimen Description ABSCESS LEFT BREAST    Special Requests PATIENT ON FOLLOWING ZOSYN    Gram Stain      MODERATE WBC PRESENT, PREDOMINANTLY PMN FEW GRAM VARIABLE ROD RARE GRAM POSITIVE COCCI IN PAIRS    Culture      CULTURE REINCUBATED FOR BETTER GROWTH Performed at [facility], [place] [address], [place], [place] [address]    Report Status PENDING     Discharge Condition: Stable  Disposition: Discharge disposition: 01-Home or Self Care        Discharge Instructions    Discharge wound care:   Complete by: As directed    LABOR:  When conractions begin, you should start to time them from the beginning of  one contraction to the beginning  of the next.  When contractions are 5 - 10 minutes apart or less and have been regular for at least an hour, you should call your health care provider.   Complete by: As directed    Notify physician for a general feeling that "something is not right"   Complete by: As directed    Notify physician for bleeding from the vagina   Complete by: As directed    Notify physician for blurring of vision or spots before the eyes   Complete by: As directed    Notify physician for chills or fever   Complete by: As directed    Notify physician for fainting  spells, "black outs" or loss of consciousness   Complete by: As directed    Notify physician for increase in vaginal discharge   Complete by: As directed    Notify physician for increase or change in vaginal discharge   Complete by: As directed    Notify physician for intestinal cramps, with or without diarrhea, sometimes described as "gas pain"   Complete by: As directed    Notify physician for leaking of fluid   Complete by: As directed    Notify physician for leaking of fluid   Complete by: As directed    Notify physician for low, dull backache, unrelieved by heat or Tylenol   Complete by: As directed    Notify physician for menstrual like cramps   Complete by: As directed    Notify physician for pain or burning when urinating   Complete by: As directed    Notify physician for pelvic pressure   Complete by: As directed    Notify physician for pelvic pressure (sudden increase)   Complete by: As directed    Notify physician for severe or continued nausea or vomiting   Complete by: As directed    Notify physician for sudden gushing of fluid from the vagina (with or without continued leaking)   Complete by: As directed    Notify physician for sudden, constant, or occasional abdominal pain   Complete by: As directed    Notify physician for uterine contractions.  These may be painless and feel like the uterus  is tightening or the baby is  "balling up"   Complete by: As directed    Notify physician for vaginal bleeding   Complete by: As directed    Notify physician if baby moving less than usual   Complete by: As directed    PRETERM LABOR:  Includes any of the follwing symptoms that occur between 20 - [redacted] weeks gestation.  If these symptoms are not stopped, preterm labor can result in preterm delivery, placing your baby at risk   Complete by: As directed      Allergies as of 03/21/2019      Reactions   Latex       Medication List    STOP taking these medications   clindamycin 1 % gel Commonly known as: Clindagel   miconazole 2 % vaginal cream Commonly known as: MONISTAT 7   polyethylene glycol 17 g packet Commonly known as: MiraLax   simethicone 80 MG chewable tablet Commonly known as: Gas-X   terconazole 0.4 % vaginal cream Commonly known as: TERAZOL 7     TAKE these medications   amoxicillin-clavulanate 875-125 MG tablet Commonly known as: AUGMENTIN Take 1 tablet by mouth 2 (two) times daily.   aspirin EC 81 MG tablet Take 1 tablet (81 mg total) by mouth daily. Take after 12 weeks for prevention of preeclampsia later in pregnancy   Blood Pressure Kit Monitor BP at home regularly extra large cuff O09.90   [place] 1 Units by Does not apply route daily as needed.   oxyCODONE 5 MG immediate release tablet Commonly known as: Roxicodone Take 1 tablet (5 mg total) by mouth every 6 (six) hours as needed for severe pain.   Prenate Mini 29-0.6-0.4-350 MG Caps Take 1 capsule by mouth daily before breakfast.   valACYclovir 1000 MG tablet Commonly known as: VALTREX Take 1,000 mg by mouth daily.            Discharge Care Instructions  (  From admission, onward)         Start     Ordered   03/21/19 0000  Discharge wound care:     03/21/19 1119         Follow-up Information    Coralie Keens, MD Follow up on 04/12/2019.   Specialty:  General Surgery Why: Your appointment at 9:40 AM.  Be at the office 30 minutes early for check in. Bring photo ID and insurance information.  If you cannot make it call the office and we can reschedule. Contact information: [address] [place] [address] [phone]        [facility]. Go on 03/27/2019.   Specialty: Obstetrics and Gynecology Why: at 2:15 pm Contact information: [address], [place] [phone]          Signed: Feliz Beam, M.D. [facility]ending [facility] [employer] [facility] (Faculty Practice)  03/21/2019, 11:22 AM

## 2019-03-21 NOTE — Discharge Instructions (Signed)
PREGNANCY SUPPORT BELT: You are not alone, Seventy-five percent of women have some sort of abdominal or back pain at some point in their pregnancy. Your baby is growing at a fast pace, which means that your whole body is rapidly trying to adjust to the changes. As your uterus grows, your back may start feeling a bit under stress and this can result in back or abdominal pain that can go from mild, and therefore bearable, to severe pains that will not allow you to sit or lay down comfortably, When it comes to dealing with pregnancy-related pains and cramps, some pregnant women usually prefer natural remedies, which the market is filled with nowadays. For example, wearing a pregnancy support belt can help ease and lessen your discomfort and pain. WHAT ARE THE BENEFITS OF WEARING A PREGNANCY SUPPORT BELT? A pregnancy support belt provides support to the lower portion of the belly taking some of the weight of the growing uterus and distributing to the other parts of your body. It is designed make you comfortable and gives you extra support. Over the years, the pregnancy apparel market has been studying the needs and wants of pregnant women and they have come up with the most comfortable pregnancy support belts that woman could ever ask for. In fact, you will no longer have to wear a stretched-out or bulky pregnancy belt that is visible underneath your clothes and makes you feel even more uncomfortable. Nowadays, a pregnancy support belt is made of comfortable and stretchy materials that will not irritate your skin but will actually make you feel at ease and you will not even notice you are wearing it. They are easy to put on and adjust during the day and can be worn at night for additional support.  BENEFITS:  Relives Back pain  Relieves Abdominal Muscle and Leg Pain  Stabilizes the Pelvic Ring  Offers a Cushioned Abdominal Lift Pad  Relieves pressure on the Sciatic Nerve Within Minutes WHERE TO GET  YOUR PREGNANCY BELT: [employer] [phone] @[id number]  [address] [place], [place]   Mechanical Wound Debridement Shower with the site open, allow soap and water into the site.  Dry after the shower.  Then repack the site with an open 4 x 4, wet with sterile saline.  Place a dry dressing over this.  Mechanical wound debridement is a treatment to remove dead tissue from a wound. This helps the wound heal. The treatment involves cleaning the wound by using mechanical force. This may include:  Wound irrigation. This method involves rinsing the wound out with fluid.  Wet-to-dry dressing. This method involves putting wet dressings into the wound, allowing them to dry, then removing the dry dressings to remove debris and tissue from the wound.  Using a pad or gauze to remove dead tissue and debris from the wound.  Pulsatile lavage. This method involves cleaning the wound with a pressurized stream of fluid. Depending on the wound, you may need to repeat this procedure or change to another form of debridement as your wound starts to heal. Tell a health care provider about:  Any allergies you have.  All medicines you are taking, including vitamins, herbs, eye drops, creams, and over-the-counter medicines.  Any blood disorders you have.  Any medical conditions you have, or have had, especially conditions that cause a decrease in blood flow to the wound area, such as peripheral vascular disease, or conditions that affect your body's defense (immune) system or white blood count.  Any surgeries you have had.  Whether you are pregnant or may be pregnant. What are the risks? Generally, this is a safe procedure. However, problems may occur, including:  Infection.  Bleeding.  Damage to healthy tissue in and around your wound.  Soreness or pain.  Failure of the wound to heal.  Scarring. What happens during the procedure?   Your health care provider may apply a  numbing medicine (topical anesthetic) to the wound.  Your health care provider may irrigate your wound with a germ-free salt solution (saline). This helps to loosen or remove debris, bacteria, and dead tissue.  Depending on the type of mechanical wound debridement you are having, your health care provider may do one of the following: ? Put a dressing on your wound. You may have a dry gauze pad placed into the wound. Your health care provider will remove the gauze after the wound is dry. Any dead tissue and debris that has dried into the gauze will be lifted out of the wound (wet-to-dry debridement). ? Use a type of pad (monofilament fiber debridement pad) that has a fluffy surface on one side that picks up dead tissue and debris from your wound. Your health care provider wets the pad and wipes it over your wound for several minutes. ? Flush (irrigate) your wound with a pressurized stream of solution such as saline or water (pulsatile lavage).  Once your health care provider is finished, he or she may apply a light dressing to your wound. The procedure may vary among health care providers and hospitals. What happens after the procedure?  You may get medicine for pain. Summary  Mechanical wound debridement is a treatment to remove dead tissue from a wound. This helps the wound heal.  Depending on the wound, you may need to repeat this procedure or change to another form of debridement as your wound starts to heal.  This treatment may include: wound irrigation, wet-to-dry dressing, using a pad or gauze to remove dead tissue and debris from the wound, or pulsatile lavage. This information is not intended to replace advice given to you by your health care provider. Make sure you discuss any questions you have with your health care provider. Document Released: 02/18/2015 Document Revised: 05/07/2018 Document Reviewed: 05/07/2018 Elsevier Patient Education  2020 [employer].

## 2019-03-21 NOTE — Progress Notes (Signed)
At bedside w/PA to change wet to dry dressing. Pt tolerated fairly well. Pt did request pain medication after dressing change. Pt has Vicodin available at 0830. Pt aware and would like to wait to take that rather than take Tylenol now. PA gave detailed wound care instructions to pt and SO. Pt & SO verbalize understanding of wound instructions for home. Marry Guan

## 2019-03-21 NOTE — Progress Notes (Addendum)
RN was made aware that pt was rude to tech during pt rounding. Tech reported pt called out asking for saltine crackers due to vomiting. Pt demanded tech to clean off bedsde table due to being full of cups, bags, and food wrapper. Tech informed pt that she and support person can also clean off bedside table as well in case nurses or techs are busy. Pt thought tech was being rude, asked for techs name and told her she did not need her to do anything for her, and proceeded to call her a "bitch". Tech reported she said ok and walked out of room. RN told tech to also write a note. RN went to check on pt after encounter, pt was sleeping. Evangeline Dakin

## 2019-03-21 NOTE — Progress Notes (Signed)
Patient refusing last dose Zosyn stating that it makes her feel nauseated and SOB. Dr. Rosana Hoes notified and encouraging patient to complete last dose IV before going home and patient continues to refuse and requesting d/c papers now.

## 2019-03-21 NOTE — Progress Notes (Signed)
SL d/cd from right arm, site WNL. Discharge instructions given to patient, pt verbalizes understanding of post-op care, medications and f/u.

## 2019-03-21 NOTE — Progress Notes (Signed)
Pt called out for some saltine crackers and tech went in there and gave her some crackers as well as more emesis bags. Pt complained about her bedside table piling up of trash and said it shouldn't be expected out of her to be able to clean it. Tech proceeded to inform her, her support person could also help if  staff is not able to be in the room at times. Pt responded " So You're catching an attitude with me? What is your name?" tech showed her her badge and stated her name. Pt responded " I don't need you to do anything for me...bitch." Tech said "ok" and exited the room. Tech reported situation to [employer].   Harlin Rain, NT

## 2019-03-23 LAB — BPAM RBC
Blood Product Expiration Date: 202011022359
Blood Product Expiration Date: 202011032359
Unit Type and Rh: 9500
Unit Type and Rh: 9500

## 2019-03-23 LAB — TYPE AND SCREEN
ABO/RH(D): B NEG
Antibody Screen: POSITIVE
Unit division: 0
Unit division: 0

## 2019-03-24 LAB — AEROBIC/ANAEROBIC CULTURE W GRAM STAIN (SURGICAL/DEEP WOUND)

## 2019-03-25 ENCOUNTER — Inpatient Hospital Stay (HOSPITAL_COMMUNITY)
Admission: AD | Admit: 2019-03-25 | Discharge: 2019-03-25 | Discharge status: Against Medical Advice | Payer: Medicaid Other | Attending: Obstetrics and Gynecology | Admitting: Obstetrics and Gynecology

## 2019-03-25 DIAGNOSIS — O9229 Other disorders of breast associated with pregnancy and the puerperium: Secondary | ICD-10-CM | POA: Insufficient documentation

## 2019-03-25 DIAGNOSIS — Z4801 Encounter for change or removal of surgical wound dressing: Secondary | ICD-10-CM | POA: Diagnosis not present

## 2019-03-25 DIAGNOSIS — Z3A34 34 weeks gestation of pregnancy: Secondary | ICD-10-CM | POA: Diagnosis not present

## 2019-03-25 NOTE — MAU Note (Signed)
Pt came to desk and states she wants to leave and call her own physician for follow up.

## 2019-03-25 NOTE — MAU Note (Signed)
.  Diamond Burton is a 33 y.o. at [redacted]w[redacted]d here in MAU reporting: breast pain from an abscess that she had removed. PT states the packing is suppose to be changed everyday but she is unable to change it properly  Onset of complaint: today Pain score: 10 There were no vitals filed for this visit.    B/P 117/68 P- 77 R- 18 Temp 98.1 O2 sat 99  FHT:146 Lab orders placed from triage:
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
## 2019-03-27 ENCOUNTER — Encounter: Payer: Medicaid Other | Admitting: Obstetrics

## 2019-04-03 ENCOUNTER — Encounter (HOSPITAL_COMMUNITY): Payer: Self-pay

## 2019-04-03 ENCOUNTER — Other Ambulatory Visit: Payer: Self-pay

## 2019-04-03 ENCOUNTER — Inpatient Hospital Stay (HOSPITAL_COMMUNITY)
Admission: AD | Admit: 2019-04-03 | Discharge: 2019-04-03 | Disposition: A | Discharge status: Home / Self Care | Payer: Medicaid Other | Attending: Obstetrics & Gynecology | Admitting: Obstetrics & Gynecology

## 2019-04-03 DIAGNOSIS — Z833 Family history of diabetes mellitus: Secondary | ICD-10-CM | POA: Diagnosis not present

## 2019-04-03 DIAGNOSIS — Z87898 Personal history of other specified conditions: Secondary | ICD-10-CM

## 2019-04-03 DIAGNOSIS — Z8781 Personal history of (healed) traumatic fracture: Secondary | ICD-10-CM | POA: Insufficient documentation

## 2019-04-03 DIAGNOSIS — R8271 Bacteriuria: Secondary | ICD-10-CM

## 2019-04-03 DIAGNOSIS — L02411 Cutaneous abscess of right axilla: Secondary | ICD-10-CM | POA: Insufficient documentation

## 2019-04-03 DIAGNOSIS — Z792 Long term (current) use of antibiotics: Secondary | ICD-10-CM | POA: Insufficient documentation

## 2019-04-03 DIAGNOSIS — Z3A35 35 weeks gestation of pregnancy: Secondary | ICD-10-CM

## 2019-04-03 DIAGNOSIS — L732 Hidradenitis suppurativa: Secondary | ICD-10-CM | POA: Diagnosis not present

## 2019-04-03 DIAGNOSIS — Z8759 Personal history of other complications of pregnancy, childbirth and the puerperium: Secondary | ICD-10-CM | POA: Diagnosis not present

## 2019-04-03 DIAGNOSIS — O91113 Abscess of breast associated with pregnancy, third trimester: Secondary | ICD-10-CM | POA: Insufficient documentation

## 2019-04-03 DIAGNOSIS — O0990 Supervision of high risk pregnancy, unspecified, unspecified trimester: Secondary | ICD-10-CM

## 2019-04-03 DIAGNOSIS — O0993 Supervision of high risk pregnancy, unspecified, third trimester: Secondary | ICD-10-CM | POA: Diagnosis not present

## 2019-04-03 DIAGNOSIS — O99343 Other mental disorders complicating pregnancy, third trimester: Secondary | ICD-10-CM | POA: Insufficient documentation

## 2019-04-03 DIAGNOSIS — O99820 Streptococcus B carrier state complicating pregnancy: Secondary | ICD-10-CM

## 2019-04-03 DIAGNOSIS — F319 Bipolar disorder, unspecified: Secondary | ICD-10-CM | POA: Diagnosis not present

## 2019-04-03 DIAGNOSIS — Z91040 Latex allergy status: Secondary | ICD-10-CM | POA: Diagnosis not present

## 2019-04-03 DIAGNOSIS — Z818 Family history of other mental and behavioral disorders: Secondary | ICD-10-CM | POA: Insufficient documentation

## 2019-04-03 DIAGNOSIS — O26893 Other specified pregnancy related conditions, third trimester: Secondary | ICD-10-CM

## 2019-04-03 DIAGNOSIS — O99713 Diseases of the skin and subcutaneous tissue complicating pregnancy, third trimester: Secondary | ICD-10-CM | POA: Insufficient documentation

## 2019-04-03 LAB — CBC WITH DIFFERENTIAL/PLATELET
Abs Immature Granulocytes: 0.03 10*3/uL (ref 0.00–0.07)
Basophils Absolute: 0.1 10*3/uL (ref 0.0–0.1)
Basophils Relative: 1 %
Eosinophils Absolute: 0.1 10*3/uL (ref 0.0–0.5)
Eosinophils Relative: 1 %
HCT: 37.5 % (ref 36.0–46.0)
Hemoglobin: 13.1 g/dL (ref 12.0–15.0)
Immature Granulocytes: 0 %
Lymphocytes Relative: 18 %
Lymphs Abs: 2.1 10*3/uL (ref 0.7–4.0)
MCH: 30.8 pg (ref 26.0–34.0)
MCHC: 34.9 g/dL (ref 30.0–36.0)
MCV: 88.2 fL (ref 80.0–100.0)
Monocytes Absolute: 0.8 10*3/uL (ref 0.1–1.0)
Monocytes Relative: 6 %
Neutro Abs: 8.8 10*3/uL — ABNORMAL HIGH (ref 1.7–7.7)
Neutrophils Relative %: 74 %
Platelets: 307 10*3/uL (ref 150–400)
RBC: 4.25 MIL/uL (ref 3.87–5.11)
RDW: 13.5 % (ref 11.5–15.5)
WBC: 11.9 10*3/uL — ABNORMAL HIGH (ref 4.0–10.5)
nRBC: 0 % (ref 0.0–0.2)

## 2019-04-03 LAB — URINALYSIS, ROUTINE W REFLEX MICROSCOPIC
Bilirubin Urine: NEGATIVE
Glucose, UA: NEGATIVE mg/dL
Hgb urine dipstick: NEGATIVE
Ketones, ur: 5 mg/dL — AB
Nitrite: NEGATIVE
Protein, ur: NEGATIVE mg/dL
Specific Gravity, Urine: 1.023 (ref 1.005–1.030)
pH: 5 (ref 5.0–8.0)

## 2019-04-03 MED ORDER — LIDOCAINE HCL (PF) 1 % IJ SOLN
30.0000 mL | Freq: Once | INTRAMUSCULAR | Status: AC
  Administered 2019-04-03: 30 mL
  Filled 2019-04-03: qty 30

## 2019-04-03 MED ORDER — CEPHALEXIN 500 MG PO CAPS: 500.0000 mg | ORAL_CAPSULE | Freq: Three times a day (TID) | ORAL | 0 refills | Status: DC

## 2019-04-03 MED ORDER — LACTATED RINGERS IV SOLN
INTRAVENOUS | Status: DC
  Administered 2019-04-03: 16:00:00 via INTRAVENOUS

## 2019-04-03 MED ORDER — HYDROCODONE-ACETAMINOPHEN 5-325 MG PO TABS: 1.0000 | ORAL_TABLET | Freq: Four times a day (QID) | ORAL | 0 refills | Status: AC | PRN

## 2019-04-03 MED ORDER — HYDROMORPHONE HCL 1 MG/ML IJ SOLN
1.0000 mg | Freq: Once | INTRAMUSCULAR | Status: AC
  Administered 2019-04-03: 1 mg via INTRAVENOUS
  Filled 2019-04-03: qty 1

## 2019-04-03 NOTE — Consult Note (Signed)
[facility] Consult/Admission Note  Diamond Burton 18-Oct-1985  [id number].    Requesting Provider: Dr. Harolyn Rutherford Chief Complaint/Reason for Consult: R axillary abscess  HPI:   Patient is a 33 yo female who is [redacted] weeks pregnant, with a hx of hydradenitis who is known to our service for a L breast abscess S/P I&D by Dr. Ninfa Linden on 10/07 who presented to the MAU with complaints of R axillary pain. Pt states she began having a burning sensation in her R axilla last night. This sensation worsened and the pain became more dull, severe, worse with touch. Associated swelling. She denies drainage but states a foul odor. She stated this felt different than her other abscesses. She denies associated symptoms.   ROS:  Review of Systems  Constitutional: Negative for chills, diaphoresis and fever.  HENT: Negative for sore throat.   Respiratory: Negative for cough and shortness of breath.   Cardiovascular: Negative for chest pain.  Gastrointestinal: Negative for abdominal pain, blood in stool, constipation, diarrhea, nausea and vomiting.  Genitourinary: Negative for dysuria.  Skin: Negative for rash.       + for R axillary pain and swelling  Neurological: Negative for dizziness and loss of consciousness.  All other systems reviewed and are negative.    Family History  Problem Relation Age of Onset  . Depression Mother   . Diabetes Mother   . Hypertension Mother   . Heart disease Mother   . Asthma Sister   . Asthma Daughter   . Hypertension Maternal Grandmother   . Diabetes Maternal Grandmother     Past Medical History:  Diagnosis Date  . Abnormal Pap smear   . Bipolar disorder ([place])   . Chlamydia infection affecting pregnancy in first trimester 10/10/2018  . Depression    stopped meds, was wanting to see counselor  . Eczema   . Erb's palsy    L arm  . Erb's palsy    left arm  . Gonorrhea affecting pregnancy in first trimester 10/10/2018  . Herpes    frequent  outbreaks on valtrex 500  . History of chlamydia   . 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 History of gonorrhea   . Hydradenitis   . Infection    UTI  . Kidney stones   . Ovarian cyst   . Periodontal disease   . PID (pelvic inflammatory disease)   . PTSD (post-traumatic stress disorder)   . Trichomonas contact     Past Surgical History:  Procedure Laterality Date  . HYDRADENITIS EXCISION  03/2019  . IRRIGATION AND DEBRIDEMENT ABSCESS Left 03/20/2019   Procedure: IRRIGATION AND DEBRIDEMENT BREAST ABSCESS;  Surgeon: Coralie Keens, MD;  Location: [facility];  Service: General;  Laterality: Left;  . NO PAST SURGERIES      Social History:  reports that she has quit smoking. Her smoking use included cigarettes. She has a 1.00 pack-year smoking history. She has never used smokeless tobacco. She reports that she does not drink alcohol or use drugs.  Allergies:  Allergies  Allergen Reactions  . Latex     Medications Prior to Admission  Medication Sig Dispense Refill  . aspirin EC 81 MG tablet Take 1 tablet (81 mg total) by mouth daily. Take after 12 weeks for prevention of preeclampsia later in pregnancy 300 tablet 2  . oxyCODONE (ROXICODONE) 5 MG immediate release tablet Take 1 tablet (5 mg total) by mouth every 6 (six) hours as needed for severe pain. 30 tablet  0  . Prenat w/o A-FeCbn-Meth-FA-DHA (PRENATE MINI) 29-0.6-0.4-350 MG CAPS Take 1 capsule by mouth daily before breakfast. 30 capsule 5  . valACYclovir (VALTREX) 1000 MG tablet Take 1,000 mg by mouth daily.    [name] amoxicillin-clavulanate (AUGMENTIN) 875-125 MG tablet Take 1 tablet by mouth 2 (two) times daily. 14 tablet 0  . Blood Pressure KIT Monitor BP at home regularly extra large cuff O09.90 1 kit 0  . Elastic Bandages & Supports (COMFORT FIT MATERNITY SUPP SM) MISC 1 Units by Does not apply route daily as needed. 1 each 0    Blood pressure 124/68, pulse 88, temperature 99.1 F (37.3 C), resp. rate 20, height _0   (1.676 m), weight 105.1 kg, SpO2 98 %, unknown if currently breastfeeding.  Physical Exam Vitals signs and nursing note reviewed.  Constitutional:      General: She is not in acute distress.    Appearance: She is well-developed. She is not diaphoretic.  HENT:     Head: Normocephalic and atraumatic.     Mouth/Throat:     Comments: Pt wearing mask Eyes:     Conjunctiva/sclera: Conjunctivae normal.     Pupils: Pupils are equal, round, and reactive to light.  Neck:     Musculoskeletal: Full passive range of motion without pain and normal range of motion.  Cardiovascular:     Rate and Rhythm: Normal rate.  Pulmonary:     Effort: Pulmonary effort is normal. No accessory muscle usage or respiratory distress.  Abdominal:     Comments: gravida abdomen   Musculoskeletal: Normal range of motion.        General: No tenderness or deformity.     Right lower leg: No edema.     Left lower leg: No edema.  Skin:    General: Skin is warm and dry.     Comments: Large area of induration, mild erythema and TTP of R axilla. Scant purulent drainage noted. Numerous scars noted to her R axilla.   Neurological:     Mental Status: She is alert and oriented to person, place, and time.     Coordination: Coordination normal.  Psychiatric:        Mood and Affect: Mood normal.        Behavior: Behavior normal.     No results found for this or any previous visit (from the past 48 hour(s)). No results found.    Assessment/Plan Active Problems:   * No active hospital problems. *  Hydradinitis - R axilla abscess that is TTP  - will perform bedside I&D - recommend antibiotics for 7 days   Kalman Drape, [facility] 04/03/2019, 4:25 PM Pager: [phone] Consults: [phone] Mon-Fri 7:00 am-4:30 pm Sat-Sun 7:00 am-11:30 am

## 2019-04-03 NOTE — Procedures (Signed)
Incision and Drainage Procedure Note  Pre-operative Diagnosis: R axillary abscess/hydrandanitis   Post-operative Diagnosis: same  Indications: pain, abscess  Anesthesia: 1% lidocaine with epinephrine  Procedure Details  The procedure, risks and complications have been discussed in detail (including, but not limited to airway compromise, infection, bleeding) with the patient, and the patient has signed consent to the procedure.  The skin was sterilely prepped and draped over the affected area in the usual fashion. After adequate local anesthesia, I&D with a #11 blade was performed on the right axilla. Purulent drainage: present The patient was observed until stable.  Findings: Small amt of purulent drainage  EBL: negligible   Drains: none  Condition: Tolerated procedure well   Complications: none.   Jackson Latino, [facility] Pager [phone]

## 2019-04-03 NOTE — Discharge Instructions (Signed)
Incision and Drainage, Care After  This sheet gives you information about how to care for yourself after your procedure. Your health care provider may also give you more specific instructions. If you have problems or questions, contact your health care provider.  What can I expect after the procedure?  After the procedure, it is common to have:  · Pain or discomfort around the incision site.  · Blood, fluid, or pus (drainage) from the incision.  · Redness and firm skin around the incision site.  Follow these instructions at home:  Medicines  · Take over-the-counter and prescription medicines only as told by your health care provider.  · If you were prescribed an antibiotic medicine, use or take it as told by your health care provider. Do not stop using the antibiotic even if you start to feel better.  Wound care    Follow instructions from your health care provider about how to take care of your wound. Make sure you:  · Wash your hands with soap and water before and after you change your bandage (dressing). If soap and water are not available, use hand sanitizer.  · Change your dressing and packing as told by your health care provider.  ? If your dressing is dry or stuck when you try to remove it, moisten or wet the dressing with saline or water so that it can be removed without harming your skin or tissues.  ? If your wound is packed, leave it in place until your health care provider tells you to remove it. To remove the packing, moisten or wet the packing with saline or water so that it can be removed without harming your skin or tissues.  · Leave stitches (sutures), skin glue, or adhesive strips in place. These skin closures may need to stay in place for 2 weeks or longer. If adhesive strip edges start to loosen and curl up, you may trim the loose edges. Do not remove adhesive strips completely unless your health care provider tells you to do that.  Check your wound every day for signs of infection. Check  for:  · More redness, swelling, or pain.  · More fluid or blood.  · Warmth.  · Pus or a bad smell.  If you were sent home with a drain tube in place, follow instructions from your health care provider about:  · How to empty it.  · How to care for it at home.     General instructions  · Rest the affected area.  · Do not take baths, swim, or use a hot tub until your health care provider approves. Ask your health care provider if you may take showers. You may only be allowed to take sponge baths.  · Return to your normal activities as told by your health care provider. Ask your health care provider what activities are safe for you. Your health care provider may put you on activity or lifting restrictions.  · The incision will continue to drain. It is normal to have some clear or slightly bloody drainage. The amount of drainage should lessen each day.  · Do not apply any creams, ointments, or liquids unless you have been told to by your health care provider.  · Keep all follow-up visits as told by your health care provider. This is important.  Contact a health care provider if:  · Your cyst or abscess returns.  · You have a fever or chills.  · You have more redness, swelling, or pain   around your incision.  · You have more fluid or blood coming from your incision.  · Your incision feels warm to the touch.  · You have pus or a bad smell coming from your incision.  · You have red streaks above or below the incision site.  Get help right away if:  · You have severe pain or bleeding.  · You cannot eat or drink without vomiting.  · You have decreased urine output.  · You become short of breath.  · You have chest pain.  · You cough up blood.  · The affected area becomes numb or starts to tingle.  These symptoms may represent a serious problem that is an emergency. Do not wait to see if the symptoms will go away. Get medical help right away. Call your local emergency services (911 in the [place] Do not drive yourself to the  hospital.  Summary  · After this procedure, it is common to have fluid, blood, or pus coming from the surgery site.  · Follow all home care instructions. You will be told how to take care of your incision, how to check for infection, and how to take medicines.  · If you were prescribed an antibiotic medicine, take it as told by your health care provider. Do not stop taking the antibiotic even if you start to feel better.  · Contact a health care provider if you have increased redness, swelling, or pain around your incision. Get help right away if you have chest pain, you vomit, you cough up blood, or you have shortness of breath.  · Keep all follow-up visits as told by your health care provider. This is important.  This information is not intended to replace advice given to you by your health care provider. Make sure you discuss any questions you have with your health care provider.  Document Released: 08/22/2011 Document Revised: 04/30/2018 Document Reviewed: 04/30/2018  Elsevier Patient Education © 2020 [employer]   

## 2019-04-03 NOTE — MAU Note (Signed)
Pt  has a swollen area under right arm, reports burning pain is 10/10 which woke her up this morning at 0530. Also  reports cramping in her abdomen and legs.  Denies contractions, LOF or bleeding. +FM

## 2019-04-03 NOTE — MAU Provider Note (Signed)
History     CSN: [id number]  Arrival date and time: 04/03/19 1429   First Provider Initiated Contact with Patient 04/03/19 1539      Chief Complaint  Patient presents with  . Abdominal Cramping   Diamond Burton is a 33 y.o. [id number] at 32w5dwho presents today with pain in her right axilla from what she believes in an abscess from Hidradenitis suppurativa. She had an abscess that was drained in the OR on 03/20/2019 in her left breast. She states that this one started a few days ago, but started getting much worse yesterday. She denies any OB complaints (contractions, VB or  LOF). She reports normal fetal movement.  Other This is a new problem. The current episode started yesterday. The problem occurs constantly. The problem has been gradually worsening. Pertinent negatives include no chills. Nothing aggravates the symptoms. She has tried nothing for the symptoms.    OB History    Gravida  5   Para  3   Term  3   Preterm      AB  1   Living  3     SAB  1   TAB      Ectopic      Multiple  0   Live Births  3           Past Medical History:  Diagnosis Date  . Abnormal Pap smear   . Bipolar disorder ([place]   . Chlamydia infection affecting pregnancy in first trimester 10/10/2018  . Depression    stopped meds, was wanting to see counselor  . Eczema   . Erb's palsy    L arm  . Erb's palsy    left arm  . Gonorrhea affecting pregnancy in first trimester 10/10/2018  . Herpes    frequent outbreaks on valtrex 500  . History of chlamydia   . 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 History of gonorrhea   . Hydradenitis   . Infection    UTI  . Kidney stones   . Ovarian cyst   . Periodontal disease   . PID (pelvic inflammatory disease)   . PTSD (post-traumatic stress disorder)   . Trichomonas contact     Past Surgical History:  Procedure Laterality Date  . HYDRADENITIS EXCISION  03/2019  . IRRIGATION AND DEBRIDEMENT ABSCESS Left 03/20/2019   Procedure:  IRRIGATION AND DEBRIDEMENT BREAST ABSCESS;  Surgeon: BCoralie Keens MD;  Location: [facility]  Service: General;  Laterality: Left;  . NO PAST SURGERIES      Family History  Problem Relation Age of Onset  . Depression Mother   . Diabetes Mother   . Hypertension Mother   . Heart disease Mother   . Asthma Sister   . Asthma Daughter   . Hypertension Maternal Grandmother   . Diabetes Maternal Grandmother     Social History   Tobacco Use  . Smoking status: Former Smoker    Packs/day: 0.50    Years: 2.00    Pack years: 1.00    Types: Cigarettes  . Smokeless tobacco: Never Used  Substance Use Topics  . Alcohol use: No    Comment: occ  . Drug use: No    Allergies:  Allergies  Allergen Reactions  . Latex     Medications Prior to Admission  Medication Sig Dispense Refill Last Dose  . aspirin EC 81 MG tablet Take 1 tablet (81 mg total) by mouth daily. Take after 12 weeks for prevention  of preeclampsia later in pregnancy 300 tablet 2 04/02/2019 at Unknown time  . oxyCODONE (ROXICODONE) 5 MG immediate release tablet Take 1 tablet (5 mg total) by mouth every 6 (six) hours as needed for severe pain. 30 tablet 0 04/03/2019 at Unknown time  . Prenat w/o A-FeCbn-Meth-FA-DHA (PRENATE MINI) 29-0.6-0.4-350 MG CAPS Take 1 capsule by mouth daily before breakfast. 30 capsule 5 04/02/2019 at Unknown time  . valACYclovir (VALTREX) 1000 MG tablet Take 1,000 mg by mouth daily.   04/02/2019 at Unknown time  . amoxicillin-clavulanate (AUGMENTIN) 875-125 MG tablet Take 1 tablet by mouth 2 (two) times daily. 14 tablet 0   . Blood Pressure KIT Monitor BP at home regularly extra large cuff O09.90 1 kit 0   . Elastic Bandages & Supports (COMFORT FIT MATERNITY SUPP SM) MISC 1 Units by Does not apply route daily as needed. 1 each 0     Review of Systems  Constitutional: Negative for chills.  Genitourinary: Negative for pelvic pain, vaginal bleeding and vaginal discharge.   Physical Exam   Blood  pressure 124/68, pulse 88, temperature 99.1 F (37.3 C), resp. rate 20, height '5\' 6"'$  (1.676 m), weight 105.1 kg, SpO2 98 %, unknown if currently breastfeeding.  Physical Exam  Nursing note and vitals reviewed. Constitutional: She is oriented to person, place, and time. She appears well-developed and well-nourished. No distress.  HENT:  Head: Normocephalic.  Cardiovascular: Normal rate.  Respiratory: Effort normal.  GI: Soft. There is no abdominal tenderness. There is no rebound.  Neurological: She is alert and oriented to person, place, and time.  Skin: Skin is warm and dry.  Psychiatric: She has a normal mood and affect.   5cm x 8cm erythremic firm mass in the right axilla. Tender to the touch with purulence noted.   NST:  Baseline: 145 Variability: moderate Accels: 15x15 Decels: none Toco: none    MAU Course  Procedures  MDM 1610: Dr. Harolyn Rutherford notified of patient and pending surgical consult.  1610: Consult with General Surgery. They will come to assess the patient.   Assessment and Plan   1. Hidradenitis suppurativa   2. Group B streptococcal bacteriuria   3. Supervision of high risk pregnancy, antepartum   4. History of low birth weight   5. Abscess of right axilla   6. [redacted] weeks gestation of pregnancy    DC home 3rd Trimester precautions  PTL precautions  Fetal kick counts RX: keflex x 7 days, vicodin PRN  Return to MAU as needed FU with OB as planned  Follow-up Information    [facility] Follow up.   Contact information: [address] [place] [address] [place] DNP, CNM  04/03/19  5:30 PM

## 2019-04-09 ENCOUNTER — Telehealth: Payer: Self-pay | Admitting: Obstetrics

## 2019-04-10 ENCOUNTER — Ambulatory Visit (INDEPENDENT_AMBULATORY_CARE_PROVIDER_SITE_OTHER): Payer: Medicaid Other | Admitting: Family Medicine

## 2019-04-10 ENCOUNTER — Other Ambulatory Visit: Payer: Self-pay

## 2019-04-10 VITALS — BP 123/81 | HR 86 | Wt 230.0 lb

## 2019-04-10 DIAGNOSIS — Z3A36 36 weeks gestation of pregnancy: Secondary | ICD-10-CM

## 2019-04-10 DIAGNOSIS — O0993 Supervision of high risk pregnancy, unspecified, third trimester: Secondary | ICD-10-CM

## 2019-04-10 NOTE — Progress Notes (Signed)
   PRENATAL VISIT NOTE  Subjective:  Diamond Burton is a 33 y.o. [id number] at [redacted]w[redacted]d being seen today for ongoing prenatal care.  She is currently monitored for the following issues for this high-risk pregnancy and has Hydradenitis; History of herpes genitalis; History of maternal syphilis, currently pregnant; Rh negative state in antepartum period; Obesity in pregnancy, antepartum; History of low birth weight infant; History of Erb's palsy; Homicidal ideation; Suicidal ideation; Supervision of high-risk pregnancy; Chlamydia infection affecting pregnancy in first trimester; Gonorrhea affecting pregnancy in first trimester; Group B streptococcal bacteriuria; and Breast abscess on their problem list.  Patient reports no complaints.  Contractions: Irregular. Vag. Bleeding: None.  Movement: Present. Denies leaking of fluid.   The following portions of the patient's history were reviewed and updated as appropriate: allergies, current medications, past family history, past medical history, past social history, past surgical history and problem list.   Objective:   Vitals:   04/10/19 1548  BP: 123/81  Pulse: 86  Weight: 230 lb (104.3 kg)    Fetal Status: Fetal Heart Rate (bpm): 140   Movement: Present     General:  Alert, oriented and cooperative. Patient is in no acute distress.  Skin: Skin is warm and dry. No rash noted.   Cardiovascular: Normal heart rate noted  Respiratory: Normal respiratory effort, no problems with respiration noted  Abdomen: Soft, gravid, appropriate for gestational age.  Pain/Pressure: Present     Pelvic: Cervical exam deferred        Extremities: Normal range of motion.     Mental Status: Normal mood and affect. Normal behavior. Normal judgment and thought content.   Assessment and Plan:  Pregnancy: [id number] at [redacted]w[redacted]d 1. Supervision of high risk pregnancy in third trimester Continue routine prenatal care. GBS is positive in urine. Recent GC/Chlam neg first of October.   Preterm labor symptoms and general obstetric precautions including but not limited to vaginal bleeding, contractions, leaking of fluid and fetal movement were reviewed in detail with the patient. Please refer to After Visit Summary for other counseling recommendations.   Return in about 1 week (around 04/17/2019) for in person.  Future Appointments  Date Time Provider [place]  04/16/2019  3:45 PM Sloan Leiter, MD [facility] None  04/24/2019  4:15 PM Chancy Milroy, MD [facility] None  05/01/2019  4:00 PM Fair, Marin Shutter, MD [facility] None  05/08/2019  4:15 PM Constant, Vickii Chafe, MD [facility] None    Donnamae Jude, MD

## 2019-04-16 ENCOUNTER — Ambulatory Visit (INDEPENDENT_AMBULATORY_CARE_PROVIDER_SITE_OTHER): Payer: Medicaid Other | Admitting: Obstetrics and Gynecology

## 2019-04-16 ENCOUNTER — Other Ambulatory Visit: Payer: Self-pay

## 2019-04-16 VITALS — BP 112/72 | HR 82 | Wt 230.1 lb

## 2019-04-16 DIAGNOSIS — O98813 Other maternal infectious and parasitic diseases complicating pregnancy, third trimester: Secondary | ICD-10-CM

## 2019-04-16 DIAGNOSIS — O0993 Supervision of high risk pregnancy, unspecified, third trimester: Secondary | ICD-10-CM

## 2019-04-16 DIAGNOSIS — R8271 Bacteriuria: Secondary | ICD-10-CM

## 2019-04-16 DIAGNOSIS — Z6791 Unspecified blood type, Rh negative: Secondary | ICD-10-CM

## 2019-04-16 DIAGNOSIS — O99213 Obesity complicating pregnancy, third trimester: Secondary | ICD-10-CM | POA: Diagnosis not present

## 2019-04-16 DIAGNOSIS — O99820 Streptococcus B carrier state complicating pregnancy: Secondary | ICD-10-CM

## 2019-04-16 DIAGNOSIS — N611 Abscess of the breast and nipple: Secondary | ICD-10-CM

## 2019-04-16 DIAGNOSIS — O09899 Supervision of other high risk pregnancies, unspecified trimester: Secondary | ICD-10-CM

## 2019-04-16 DIAGNOSIS — Z87898 Personal history of other specified conditions: Secondary | ICD-10-CM

## 2019-04-16 DIAGNOSIS — O26893 Other specified pregnancy related conditions, third trimester: Secondary | ICD-10-CM

## 2019-04-16 DIAGNOSIS — O98213 Gonorrhea complicating pregnancy, third trimester: Secondary | ICD-10-CM

## 2019-04-16 DIAGNOSIS — Z8619 Personal history of other infectious and parasitic diseases: Secondary | ICD-10-CM

## 2019-04-16 DIAGNOSIS — A749 Chlamydial infection, unspecified: Secondary | ICD-10-CM

## 2019-04-16 DIAGNOSIS — Z3A37 37 weeks gestation of pregnancy: Secondary | ICD-10-CM

## 2019-04-16 DIAGNOSIS — O98811 Other maternal infectious and parasitic diseases complicating pregnancy, first trimester: Secondary | ICD-10-CM

## 2019-04-16 DIAGNOSIS — O99210 Obesity complicating pregnancy, unspecified trimester: Secondary | ICD-10-CM

## 2019-04-16 DIAGNOSIS — O98211 Gonorrhea complicating pregnancy, first trimester: Secondary | ICD-10-CM

## 2019-04-16 MED ORDER — VALACYCLOVIR HCL 500 MG PO TABS: 500.0000 mg | ORAL_TABLET | Freq: Two times a day (BID) | ORAL | 6 refills | Status: DC

## 2019-04-16 NOTE — Progress Notes (Signed)
   PRENATAL VISIT NOTE  Subjective:  Diamond Burton is a 33 y.o. [id number] at [redacted]w[redacted]d being seen today for ongoing prenatal care.  She is currently monitored for the following issues for this high-risk pregnancy and has Hydradenitis; History of herpes genitalis; History of maternal syphilis, currently pregnant; Rh negative state in antepartum period; Obesity in pregnancy, antepartum; History of low birth weight infant; History of Erb's palsy; Homicidal ideation; Suicidal ideation; Supervision of high-risk pregnancy; Chlamydia infection affecting pregnancy in first trimester; Gonorrhea affecting pregnancy in first trimester; Group B streptococcal bacteriuria; and Breast abscess on their problem list.  Patient reports occasional contractions.  Contractions: Irregular. Vag. Bleeding: None.  Movement: Present. Denies leaking of fluid.   The following portions of the patient's history were reviewed and updated as appropriate: allergies, current medications, past family history, past medical history, past social history, past surgical history and problem list.   Objective:   Vitals:   04/16/19 1553  BP: 112/72  Pulse: 82  Weight: 230 lb 1.6 oz (104.4 kg)    Fetal Status: Fetal Heart Rate (bpm): 158   Movement: Present     General:  Alert, oriented and cooperative. Patient is in no acute distress.  Skin: Skin is warm and dry. No rash noted.   Cardiovascular: Normal heart rate noted  Respiratory: Normal respiratory effort, no problems with respiration noted  Abdomen: Soft, gravid, appropriate for gestational age.  Pain/Pressure: Present     Pelvic: Cervical exam deferred        Extremities: Normal range of motion.  Edema: Trace  Mental Status: Normal mood and affect. Normal behavior. Normal judgment and thought content.   Pt informed that the ultrasound is considered a limited OB ultrasound and is not intended to be a complete ultrasound exam.  Patient also informed that the ultrasound is not  being completed with the intent of assessing for fetal or placental anomalies or any pelvic abnormalities.  Explained that the purpose of today's ultrasound is to assess for  presentation.  Patient acknowledges the purpose of the exam and the limitations of the study.    Bedside US: cephalic  Assessment and Plan:  Pregnancy: [id number] at [redacted]w[redacted]d  1. Supervision of high risk pregnancy in third trimester  2. Rh negative state in antepartum period S/p Rho gam  3. Obesity in pregnancy, antepartum  4. History of herpes genitalis Cont valtrex No current symptoms  5. History of maternal syphilis, currently pregnant Neg RPR  6. Chlamydia infection affecting pregnancy in first trimester Neg TOC  7. Gonorrhea affecting pregnancy in first trimester Neg TOC  8. Group B streptococcal bacteriuria ppx in labor  9. History of low birth weight infant Last EFW 86th%tile  10. Breast abscess S/p I&D by gen surg   Term labor symptoms and general obstetric precautions including but not limited to vaginal bleeding, contractions, leaking of fluid and fetal movement were reviewed in detail with the patient. Please refer to After Visit Summary for other counseling recommendations.   Return in about 1 week (around 04/23/2019) for high OB, in person.  Future Appointments  Date Time Provider [place]  04/24/2019  4:15 PM Anyanwu, Sallyanne Havers, MD [facility] None  05/01/2019  4:00 PM Fair, Marin Shutter, MD [facility] None  05/08/2019  4:15 PM Constant, Vickii Chafe, MD [facility] None    Sloan Leiter, MD

## 2019-04-16 NOTE — Progress Notes (Signed)
Pt is here for ROB. [redacted]w[redacted]d.  

## 2019-04-18 ENCOUNTER — Other Ambulatory Visit: Payer: Self-pay | Admitting: Obstetrics

## 2019-04-18 DIAGNOSIS — O0990 Supervision of high risk pregnancy, unspecified, unspecified trimester: Secondary | ICD-10-CM

## 2019-04-24 ENCOUNTER — Encounter: Payer: Self-pay | Admitting: Obstetrics & Gynecology

## 2019-04-24 ENCOUNTER — Other Ambulatory Visit: Payer: Self-pay

## 2019-04-24 ENCOUNTER — Ambulatory Visit (INDEPENDENT_AMBULATORY_CARE_PROVIDER_SITE_OTHER): Payer: Medicaid Other | Admitting: Obstetrics & Gynecology

## 2019-04-24 VITALS — BP 116/76 | HR 95 | Wt 232.0 lb

## 2019-04-24 DIAGNOSIS — Z8619 Personal history of other infectious and parasitic diseases: Secondary | ICD-10-CM

## 2019-04-24 DIAGNOSIS — O0993 Supervision of high risk pregnancy, unspecified, third trimester: Secondary | ICD-10-CM

## 2019-04-24 DIAGNOSIS — R8271 Bacteriuria: Secondary | ICD-10-CM

## 2019-04-24 DIAGNOSIS — O0990 Supervision of high risk pregnancy, unspecified, unspecified trimester: Secondary | ICD-10-CM

## 2019-04-24 DIAGNOSIS — Z3A38 38 weeks gestation of pregnancy: Secondary | ICD-10-CM

## 2019-04-24 NOTE — Progress Notes (Signed)
   PRENATAL VISIT NOTE  Subjective:  Diamond Burton is a 33 y.o. A8T4196 at [redacted]w[redacted]d being seen today for ongoing prenatal care.  She is currently monitored for the following issues for this high-risk pregnancy and has Hydradenitis; History of herpes genitalis; History of maternal syphilis, currently pregnant; Rh negative state in antepartum period; Obesity in pregnancy, antepartum; History of low birth weight infant; History of Erb's palsy; Homicidal ideation; Suicidal ideation; Supervision of high-risk pregnancy; Chlamydia infection affecting pregnancy in first trimester; Gonorrhea affecting pregnancy in first trimester; Group B streptococcal bacteriuria; and Breast abscess on their problem list.  Patient reports occasional contractions.  Contractions: Irregular. Vag. Bleeding: None.  Movement: Present. Denies leaking of fluid.   The following portions of the patient's history were reviewed and updated as appropriate: allergies, current medications, past family history, past medical history, past social history, past surgical history and problem list.   Objective:   Vitals:   04/24/19 1603  BP: 116/76  Pulse: 95  Weight: 232 lb (105.2 kg)    Fetal Status: Fetal Heart Rate (bpm): 170 Fundal Height: 39 cm Movement: Present  Presentation: Vertex  General:  Alert, oriented and cooperative. Patient is in no acute distress.  Skin: Skin is warm and dry. No rash noted.   Cardiovascular: Normal heart rate noted  Respiratory: Normal respiratory effort, no problems with respiration noted  Abdomen: Soft, gravid, appropriate for gestational age.  Pain/Pressure: Present     Pelvic: Cervical exam performed Dilation: 2 Effacement (%): 50 Station: -3  Extremities: Normal range of motion.  Edema: Trace  Mental Status: Normal mood and affect. Normal behavior. Normal judgment and thought content.   Assessment and Plan:  Pregnancy: [id number] at [redacted]w[redacted]d 1. History of herpes genitalis On Valtrex  prophylaxis  2. Group B streptococcal bacteriuria Will get intrapartum prophylaxis  3. Supervision of high risk pregnancy, antepartum Good cervical change! Term labor symptoms and general obstetric precautions including but not limited to vaginal bleeding, contractions, leaking of fluid and fetal movement were reviewed in detail with the patient. Please refer to After Visit Summary for other counseling recommendations.   Return for OFFICE OB Visit.  Future Appointments  Date Time Provider [place]  05/01/2019  4:00 PM Fair, Marin Shutter, MD [facility] None  05/08/2019  4:15 PM Constant, Vickii Chafe, MD [facility] None    Verita Schneiders, MD

## 2019-04-24 NOTE — Patient Instructions (Signed)
Return to office for any scheduled appointments. Call the office or go to the MAU at [facility] [place] if:  You begin to have strong, frequent contractions  Your water breaks.  Sometimes it is a big gush of fluid, sometimes it is just a trickle that keeps getting your panties wet or running down your legs  You have vaginal bleeding.  It is normal to have a small amount of spotting if your cervix was checked.   You do not feel your baby moving like normal.  If you do not, get something to eat and drink and lay down and focus on feeling your baby move.   If your baby is still not moving like normal, you should call the office or go to MAU.  Any other obstetric concerns.   

## 2019-04-28 ENCOUNTER — Inpatient Hospital Stay (HOSPITAL_COMMUNITY)
Admission: AD | Admit: 2019-04-28 | Discharge: 2019-04-30 | DRG: 806 | Disposition: A | Discharge status: Home / Self Care | Payer: Medicaid Other | Attending: Obstetrics and Gynecology | Admitting: Obstetrics and Gynecology

## 2019-04-28 ENCOUNTER — Other Ambulatory Visit: Payer: Self-pay

## 2019-04-28 ENCOUNTER — Encounter (HOSPITAL_COMMUNITY): Payer: Self-pay

## 2019-04-28 DIAGNOSIS — Z20828 Contact with and (suspected) exposure to other viral communicable diseases: Secondary | ICD-10-CM | POA: Diagnosis present

## 2019-04-28 DIAGNOSIS — O0993 Supervision of high risk pregnancy, unspecified, third trimester: Secondary | ICD-10-CM

## 2019-04-28 DIAGNOSIS — Z3A39 39 weeks gestation of pregnancy: Secondary | ICD-10-CM | POA: Diagnosis not present

## 2019-04-28 DIAGNOSIS — O26893 Other specified pregnancy related conditions, third trimester: Secondary | ICD-10-CM | POA: Diagnosis present

## 2019-04-28 DIAGNOSIS — Z87891 Personal history of nicotine dependence: Secondary | ICD-10-CM

## 2019-04-28 DIAGNOSIS — O99824 Streptococcus B carrier state complicating childbirth: Secondary | ICD-10-CM | POA: Diagnosis present

## 2019-04-28 DIAGNOSIS — O9832 Other infections with a predominantly sexual mode of transmission complicating childbirth: Secondary | ICD-10-CM | POA: Diagnosis present

## 2019-04-28 DIAGNOSIS — R45851 Suicidal ideations: Secondary | ICD-10-CM

## 2019-04-28 DIAGNOSIS — O99214 Obesity complicating childbirth: Secondary | ICD-10-CM | POA: Diagnosis present

## 2019-04-28 DIAGNOSIS — Z3689 Encounter for other specified antenatal screening: Secondary | ICD-10-CM

## 2019-04-28 DIAGNOSIS — A6000 Herpesviral infection of urogenital system, unspecified: Secondary | ICD-10-CM | POA: Diagnosis present

## 2019-04-28 DIAGNOSIS — O26899 Other specified pregnancy related conditions, unspecified trimester: Secondary | ICD-10-CM

## 2019-04-28 DIAGNOSIS — Z6791 Unspecified blood type, Rh negative: Secondary | ICD-10-CM

## 2019-04-28 DIAGNOSIS — Z8619 Personal history of other infectious and parasitic diseases: Secondary | ICD-10-CM | POA: Diagnosis present

## 2019-04-28 DIAGNOSIS — O99344 Other mental disorders complicating childbirth: Secondary | ICD-10-CM | POA: Diagnosis present

## 2019-04-28 DIAGNOSIS — F319 Bipolar disorder, unspecified: Secondary | ICD-10-CM | POA: Diagnosis present

## 2019-04-28 DIAGNOSIS — R8271 Bacteriuria: Secondary | ICD-10-CM | POA: Diagnosis present

## 2019-04-28 NOTE — H&P (Signed)
Diamond Burton is a 33 y.o. female, G5P3013 at 39.2 weeks, presenting for contractions that have been ongoing for about one week, but worsened in the last 4 hours. Patient receives care at [facility] and was supervised for a high risk pregnancy. Pregnancy and medical history significant for problems as listed below. She is GBS positive and expresses a desire for epidural for pain management.  She is anticipating a female infant and requests pills for PP birth control method.      Patient Active Problem List   Diagnosis Date Noted  . Breast abscess 03/19/2019  . Group B streptococcal bacteriuria 10/18/2018  . Chlamydia infection affecting pregnancy in first trimester 10/10/2018  . Gonorrhea affecting pregnancy in first trimester 10/10/2018  . Supervision of high-risk pregnancy 10/08/2018  . Homicidal ideation 09/11/2018  . Suicidal ideation 09/11/2018  . 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 History of low birth weight infant 11/28/2016  . Rh negative state in antepartum period 08/02/2016  . Obesity in pregnancy, antepartum 08/02/2016  . History of herpes genitalis 06/01/2016  . History of maternal syphilis, currently pregnant 06/01/2016  . Hydradenitis     History of present pregnancy:  Last evaluation:  April 24, 2019 in office by Dr. Harolyn Rutherford.  2/50/-3; No speculum Exam.   Nursing Staff Provider  Office Location [place] [facility]  Dating  U/S  Language  ENGLISH Anatomy US  WNL  Flu Vaccine  Declined 02/07/19 Genetic Screen  NIPS: Low risk female  AFP:  Not done  TDaP vaccine   declined 02/19/2019 Hgb A1C or  GTT Early A1C 5.6 Third trimester nl 2 hour  Rhogam   02/19/2019   LAB RESULTS   Feeding Plan Breast  Blood Type B/Negative/-- (04/27 0911)   Contraception Nexplanon Antibody Negative (04/27 0911)  Circumcision Yes if a boy Rubella 2.19 (04/27 0911)  Pediatrician  [facility] for [facility] RPR Non Reactive (04/27 0911)   Support Person FOB  HBsAg Negative (04/27 0911)   Prenatal Classes No  HIV Non Reactive (04/27 0911)  BTL Consent  GBS  POSITIVE  Blood Pressure cuff  Pap Normal 12/19/17    Hgb Electro  AA   +GC 4/27, - 5/27 CF neg   +Chlam 4/27, -5/27 SMA neg    OB History    Gravida  5   Para  3   Term  3   Preterm      AB  1   Living  3     SAB  1   TAB      Ectopic      Multiple  0   Live Births  3             Past Medical History:  Diagnosis Date  . Abnormal Pap smear   . Bipolar disorder ([place])   . Chlamydia infection affecting pregnancy in first trimester 10/10/2018  . Depression    stopped meds, was wanting to see counselor  . Eczema   . Erb's palsy    L arm  . Erb's palsy    left arm  . Gonorrhea affecting pregnancy in first trimester 10/10/2018  . Herpes    frequent outbreaks on valtrex 500  . History of chlamydia   . 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 History of gonorrhea   . Hydradenitis   . Infection    UTI  . Kidney stones   . Ovarian cyst   . Periodontal disease   . PID (pelvic inflammatory  disease)   . PTSD (post-traumatic stress disorder)   . Trichomonas contact    Past Surgical History:  Procedure Laterality Date  . HYDRADENITIS EXCISION  03/2019  . IRRIGATION AND DEBRIDEMENT ABSCESS Left 03/20/2019   Procedure: IRRIGATION AND DEBRIDEMENT BREAST ABSCESS;  Surgeon: Abigail Miyamoto, MD;  Location: [facility];  Service: General;  Laterality: Left;  . NO PAST SURGERIES     Family History: family history includes Asthma in her daughter and sister; Depression in her mother; Diabetes in her maternal grandmother and mother; Heart disease in her mother; Hypertension in her maternal grandmother and mother. Social History:  reports that she has quit smoking. Her smoking use included cigarettes. She has a 1.00 pack-year smoking history. She has never used smokeless tobacco. She reports that she does not drink alcohol or use drugs.   Prenatal Transfer Tool  Maternal Diabetes: No Genetic Screening: Normal  NIPS Maternal Ultrasounds/Referrals: Normal Fetal Ultrasounds or other Referrals:  None Maternal Substance Abuse:  No Significant Maternal Medications:  Meds include: Other: Valtrex, bASA Significant Maternal Lab Results: Group B Strep positive   Maternal Assessment:  ROS: +Contractions, -LOF, -Vaginal Bleeding, +Vaginal Spotting +Fetal Movement  All other systems reviewed and negative.    Allergies  Allergen Reactions  . Latex      Dilation: 5 Effacement (%): 60 Station: -2 Exam by:: Sabas Sous, CNM Blood pressure 133/63, pulse 74, temperature 98.1 F (36.7 C), temperature source Oral, resp. rate 20, SpO2 100 %, unknown if currently breastfeeding.  Physical Exam  Vitals reviewed. Constitutional: She is oriented to person, place, and time. She appears well-developed and well-nourished.  HENT:  Head: Normocephalic and atraumatic.  Eyes: Conjunctivae are normal.  Cardiovascular: Normal rate.  Respiratory: Effort normal.  GI: Soft.  Genitourinary: There is no lesion on the right labia. There is no lesion on the left labia.    Vaginal discharge present.     No vaginal bleeding.  No bleeding in the vagina.    Genitourinary Comments: Speculum Exam: -Normal External Genitalia: Non tender, Discharge noted at introitus.  -Vaginal Vault: Pink mucosa with good rugae. No apparent lesions appreciated. Moderate amt thin reddish brown discharge cw bloody show. -Cervix:Pink, no lesions, cysts, or polyps.  Appears open. No active bleeding from os -Bimanual Exam: Dilation: 5 Effacement (%): 60 Cervical Position: Middle Station: -2 Presentation: Vertex Exam by:: Sabas Sous, CNM    Musculoskeletal: Normal range of motion.        General: No edema.  Neurological: She is alert and oriented to person, place, and time.  Skin: Skin is warm and dry.  Psychiatric: She has a normal mood and affect. Her behavior is normal.    Fetal Assessment: [place]: -Pelvis:Proven -EFW: 7  1/2lbs -Presentation:Vertex  FHR: 135 bpm, Mod Var, +One Variable Decel with Supine Position, +Accels UCs:  Palpates mild to moderate    Assessment IUP at 39.2 weeks Cat I FT Active Labor GBS Positive H/O HSV  Plan: Vaginal Exam Negative for Lesions Admit to [employer]  Routine Labor and Delivery Orders per Protocol Give PCN for Prophylaxis Okay for epidural when desired Expectant mgmt at current Will reassess as appropriate  Joellyn Quails, MSN 04/28/2019, 11:10 PM

## 2019-04-28 NOTE — MAU Note (Signed)
Patient presents to MAU c/o ctx that "have been getting stronger and closer together in the past 2 hours." Patient reports pressure in bottom,"it feels like something is about to bust through." +FM, spotting, denies LOF.

## 2019-04-29 ENCOUNTER — Other Ambulatory Visit: Payer: Self-pay

## 2019-04-29 ENCOUNTER — Inpatient Hospital Stay (HOSPITAL_COMMUNITY): Payer: Medicaid Other | Admitting: Anesthesiology

## 2019-04-29 ENCOUNTER — Encounter (HOSPITAL_COMMUNITY): Payer: Self-pay

## 2019-04-29 DIAGNOSIS — O99344 Other mental disorders complicating childbirth: Secondary | ICD-10-CM | POA: Diagnosis present

## 2019-04-29 DIAGNOSIS — Z3A39 39 weeks gestation of pregnancy: Secondary | ICD-10-CM | POA: Diagnosis not present

## 2019-04-29 DIAGNOSIS — F319 Bipolar disorder, unspecified: Secondary | ICD-10-CM | POA: Diagnosis present

## 2019-04-29 DIAGNOSIS — Z20828 Contact with and (suspected) exposure to other viral communicable diseases: Secondary | ICD-10-CM | POA: Diagnosis present

## 2019-04-29 DIAGNOSIS — O99214 Obesity complicating childbirth: Secondary | ICD-10-CM | POA: Diagnosis present

## 2019-04-29 DIAGNOSIS — Z87891 Personal history of nicotine dependence: Secondary | ICD-10-CM | POA: Diagnosis not present

## 2019-04-29 DIAGNOSIS — A6000 Herpesviral infection of urogenital system, unspecified: Secondary | ICD-10-CM | POA: Diagnosis present

## 2019-04-29 DIAGNOSIS — Z6791 Unspecified blood type, Rh negative: Secondary | ICD-10-CM | POA: Diagnosis not present

## 2019-04-29 DIAGNOSIS — O26893 Other specified pregnancy related conditions, third trimester: Secondary | ICD-10-CM | POA: Diagnosis present

## 2019-04-29 DIAGNOSIS — O99824 Streptococcus B carrier state complicating childbirth: Secondary | ICD-10-CM | POA: Diagnosis present

## 2019-04-29 DIAGNOSIS — O9832 Other infections with a predominantly sexual mode of transmission complicating childbirth: Secondary | ICD-10-CM | POA: Diagnosis present

## 2019-04-29 LAB — SARS CORONAVIRUS 2 (TAT 6-24 HRS): SARS Coronavirus 2: NEGATIVE

## 2019-04-29 LAB — CBC
HCT: 39.1 % (ref 36.0–46.0)
Hemoglobin: 13.6 g/dL (ref 12.0–15.0)
MCH: 31.1 pg (ref 26.0–34.0)
MCHC: 34.8 g/dL (ref 30.0–36.0)
MCV: 89.5 fL (ref 80.0–100.0)
Platelets: 267 10*3/uL (ref 150–400)
RBC: 4.37 MIL/uL (ref 3.87–5.11)
RDW: 13.7 % (ref 11.5–15.5)
WBC: 9.2 10*3/uL (ref 4.0–10.5)
nRBC: 0 % (ref 0.0–0.2)

## 2019-04-29 LAB — TYPE AND SCREEN
ABO/RH(D): B NEG
Antibody Screen: NEGATIVE

## 2019-04-29 LAB — RPR: RPR Ser Ql: NONREACTIVE

## 2019-04-29 MED ORDER — FENTANYL-BUPIVACAINE-NACL 0.5-0.125-0.9 MG/250ML-% EP SOLN
EPIDURAL | Status: AC
  Filled 2019-04-29: qty 250

## 2019-04-29 MED ORDER — FENTANYL CITRATE (PF) 100 MCG/2ML IJ SOLN: 50.0000 ug | INTRAMUSCULAR | Status: DC | PRN

## 2019-04-29 MED ORDER — IBUPROFEN 600 MG PO TABS
600.0000 mg | ORAL_TABLET | Freq: Four times a day (QID) | ORAL | Status: DC
  Administered 2019-04-29 – 2019-04-30 (×5): 600 mg via ORAL
  Filled 2019-04-29 (×5): qty 1

## 2019-04-29 MED ORDER — PENICILLIN G POT IN DEXTROSE 60000 UNIT/ML IV SOLN
3.0000 10*6.[IU] | INTRAVENOUS | Status: DC
  Administered 2019-04-29: 05:00:00 3 10*6.[IU] via INTRAVENOUS
  Filled 2019-04-29: qty 50

## 2019-04-29 MED ORDER — EPHEDRINE 5 MG/ML INJ: 10.0000 mg | INTRAVENOUS | Status: DC | PRN

## 2019-04-29 MED ORDER — DIPHENHYDRAMINE HCL 50 MG/ML IJ SOLN: 12.5000 mg | INTRAMUSCULAR | Status: DC | PRN

## 2019-04-29 MED ORDER — PHENYLEPHRINE 40 MCG/ML (10ML) SYRINGE FOR IV PUSH (FOR BLOOD PRESSURE SUPPORT)
80.0000 ug | PREFILLED_SYRINGE | INTRAVENOUS | Status: DC | PRN
  Filled 2019-04-29: qty 10

## 2019-04-29 MED ORDER — LACTATED RINGERS IV SOLN: INTRAVENOUS | Status: DC

## 2019-04-29 MED ORDER — LACTATED RINGERS IV SOLN
500.0000 mL | Freq: Once | INTRAVENOUS | Status: AC
  Administered 2019-04-29: 500 mL via INTRAVENOUS

## 2019-04-29 MED ORDER — LIDOCAINE-EPINEPHRINE (PF) 2 %-1:200000 IJ SOLN
INTRAMUSCULAR | Status: DC | PRN
  Administered 2019-04-29: 6 mL via EPIDURAL

## 2019-04-29 MED ORDER — ONDANSETRON HCL 4 MG PO TABS: 4.0000 mg | ORAL_TABLET | ORAL | Status: DC | PRN

## 2019-04-29 MED ORDER — LIDOCAINE HCL (PF) 1 % IJ SOLN: 30.0000 mL | INTRAMUSCULAR | Status: DC | PRN

## 2019-04-29 MED ORDER — LACTATED RINGERS IV SOLN: 500.0000 mL | INTRAVENOUS | Status: DC | PRN

## 2019-04-29 MED ORDER — ACETAMINOPHEN 325 MG PO TABS: 650.0000 mg | ORAL_TABLET | ORAL | Status: DC | PRN

## 2019-04-29 MED ORDER — SIMETHICONE 80 MG PO CHEW: 80.0000 mg | CHEWABLE_TABLET | ORAL | Status: DC | PRN

## 2019-04-29 MED ORDER — TETANUS-DIPHTH-ACELL PERTUSSIS 5-2.5-18.5 LF-MCG/0.5 IM SUSP: 0.5000 mL | Freq: Once | INTRAMUSCULAR | Status: DC

## 2019-04-29 MED ORDER — OXYTOCIN 40 UNITS IN NORMAL SALINE INFUSION - SIMPLE MED
1.0000 m[IU]/min | INTRAVENOUS | Status: DC
  Administered 2019-04-29: 3 m[IU]/min via INTRAVENOUS
  Administered 2019-04-29: 2 m[IU]/min via INTRAVENOUS

## 2019-04-29 MED ORDER — ERYTHROMYCIN 5 MG/GM OP OINT
TOPICAL_OINTMENT | OPHTHALMIC | Status: AC
  Filled 2019-04-29: qty 1

## 2019-04-29 MED ORDER — DIPHENHYDRAMINE HCL 25 MG PO CAPS: 25.0000 mg | ORAL_CAPSULE | Freq: Four times a day (QID) | ORAL | Status: DC | PRN

## 2019-04-29 MED ORDER — ZOLPIDEM TARTRATE 5 MG PO TABS: 5.0000 mg | ORAL_TABLET | Freq: Every evening | ORAL | Status: DC | PRN

## 2019-04-29 MED ORDER — LIDOCAINE HCL (PF) 1 % IJ SOLN
INTRAMUSCULAR | Status: DC | PRN
  Administered 2019-04-29 (×2): 6 mL via EPIDURAL

## 2019-04-29 MED ORDER — COCONUT OIL OIL: 1.0000 "application " | TOPICAL_OIL | Status: DC | PRN

## 2019-04-29 MED ORDER — TRAMADOL HCL 50 MG PO TABS
50.0000 mg | ORAL_TABLET | Freq: Four times a day (QID) | ORAL | Status: DC | PRN
  Administered 2019-04-29 – 2019-04-30 (×2): 50 mg via ORAL
  Filled 2019-04-29 (×2): qty 1

## 2019-04-29 MED ORDER — ONDANSETRON HCL 4 MG/2ML IJ SOLN
4.0000 mg | Freq: Four times a day (QID) | INTRAMUSCULAR | Status: DC | PRN
  Administered 2019-04-29: 02:00:00 4 mg via INTRAVENOUS
  Filled 2019-04-29: qty 2

## 2019-04-29 MED ORDER — SODIUM CHLORIDE 0.9 % IV SOLN
5.0000 10*6.[IU] | Freq: Once | INTRAVENOUS | Status: AC
  Administered 2019-04-29: 01:00:00 5 10*6.[IU] via INTRAVENOUS
  Filled 2019-04-29: qty 5

## 2019-04-29 MED ORDER — OXYTOCIN BOLUS FROM INFUSION
500.0000 mL | Freq: Once | INTRAVENOUS | Status: AC
  Administered 2019-04-29: 500 mL via INTRAVENOUS

## 2019-04-29 MED ORDER — OXYTOCIN 40 UNITS IN NORMAL SALINE INFUSION - SIMPLE MED
2.5000 [IU]/h | INTRAVENOUS | Status: DC
  Filled 2019-04-29: qty 1000

## 2019-04-29 MED ORDER — WITCH HAZEL-GLYCERIN EX PADS: 1.0000 "application " | MEDICATED_PAD | CUTANEOUS | Status: DC | PRN

## 2019-04-29 MED ORDER — PHENYLEPHRINE 40 MCG/ML (10ML) SYRINGE FOR IV PUSH (FOR BLOOD PRESSURE SUPPORT)
80.0000 ug | PREFILLED_SYRINGE | INTRAVENOUS | Status: DC | PRN
  Administered 2019-04-29: 02:00:00 80 ug via INTRAVENOUS

## 2019-04-29 MED ORDER — SOD CITRATE-CITRIC ACID 500-334 MG/5ML PO SOLN: 30.0000 mL | ORAL | Status: DC | PRN

## 2019-04-29 MED ORDER — BENZOCAINE-MENTHOL 20-0.5 % EX AERO
1.0000 "application " | INHALATION_SPRAY | CUTANEOUS | Status: DC | PRN
  Filled 2019-04-29: qty 56

## 2019-04-29 MED ORDER — SENNOSIDES-DOCUSATE SODIUM 8.6-50 MG PO TABS
2.0000 | ORAL_TABLET | ORAL | Status: DC
  Administered 2019-04-30: 2 via ORAL
  Filled 2019-04-29: qty 2

## 2019-04-29 MED ORDER — PRENATAL MULTIVITAMIN CH
1.0000 | ORAL_TABLET | Freq: Every day | ORAL | Status: DC
  Administered 2019-04-29 – 2019-04-30 (×2): 1 via ORAL
  Filled 2019-04-29 (×2): qty 1

## 2019-04-29 MED ORDER — ACETAMINOPHEN 325 MG PO TABS
650.0000 mg | ORAL_TABLET | ORAL | Status: DC | PRN
  Administered 2019-04-29 (×2): 650 mg via ORAL
  Filled 2019-04-29 (×2): qty 2

## 2019-04-29 MED ORDER — FENTANYL-BUPIVACAINE-NACL 0.5-0.125-0.9 MG/250ML-% EP SOLN
12.0000 mL/h | EPIDURAL | Status: DC | PRN
  Administered 2019-04-29: 12 mL/h via EPIDURAL

## 2019-04-29 MED ORDER — DIBUCAINE (PERIANAL) 1 % EX OINT: 1.0000 "application " | TOPICAL_OINTMENT | CUTANEOUS | Status: DC | PRN

## 2019-04-29 MED ORDER — ONDANSETRON HCL 4 MG/2ML IJ SOLN: 4.0000 mg | INTRAMUSCULAR | Status: DC | PRN

## 2019-04-29 MED ORDER — TERBUTALINE SULFATE 1 MG/ML IJ SOLN: 0.2500 mg | Freq: Once | INTRAMUSCULAR | Status: DC | PRN

## 2019-04-29 MED ORDER — FENTANYL-BUPIVACAINE-NACL 0.5-0.125-0.9 MG/250ML-% EP SOLN: 12.0000 mL/h | EPIDURAL | Status: DC | PRN

## 2019-04-29 NOTE — Anesthesia Preprocedure Evaluation (Signed)
Anesthesia Evaluation  Patient identified by MRN, date of birth, ID band Patient awake    Reviewed: Allergy & Precautions, H&P , NPO status , Patient's Chart, lab work & pertinent test results, reviewed documented beta blocker date and time   Airway Mallampati: II  TM Distance: >3 FB Neck ROM: full    Dental no notable dental hx.    Pulmonary neg pulmonary ROS, Patient abstained from smoking., former smoker,    Pulmonary exam normal breath sounds clear to auscultation       Cardiovascular negative cardio ROS Normal cardiovascular exam Rhythm:regular Rate:Normal     Neuro/Psych negative neurological ROS  negative psych ROS   GI/Hepatic negative GI ROS, Neg liver ROS,   Endo/Other  Morbid obesity  Renal/GU negative Renal ROS  negative genitourinary   Musculoskeletal   Abdominal (+) + obese,   Peds  Hematology negative hematology ROS (+)   Anesthesia Other Findings   Reproductive/Obstetrics (+) Pregnancy                             Anesthesia Physical Anesthesia Plan  ASA: II  Anesthesia Plan: Epidural   Post-op Pain Management:    Induction:   PONV Risk Score and Plan:   Airway Management Planned:   Additional Equipment:   Intra-op Plan:   Post-operative Plan:   Informed Consent: I have reviewed the patients History and Physical, chart, labs and discussed the procedure including the risks, benefits and alternatives for the proposed anesthesia with the patient or authorized representative who has indicated his/her understanding and acceptance.       Plan Discussed with: Anesthesiologist  Anesthesia Plan Comments:         Anesthesia Quick Evaluation

## 2019-04-29 NOTE — Progress Notes (Signed)
Labor Progress Note Diamond Burton is a 33 y.o. [id number] at [redacted]w[redacted]d presented for SOL. S: Comfortable with epidural.  O:  BP (!) 97/39   Pulse 70   Temp (!) 96.7 F (35.9 C) (Axillary)   Resp 16   Ht 5\' 6"  (1.676 m)   Wt 105.7 kg   SpO2 95%   BMI 37.61 kg/m  EFM: 130, reactive, moderate variability, pos accels, no decels currently  Ctx: q73m  CVE: Dilation: 7.5 Effacement (%): 90 Cervical Position: Middle Station: -1 Presentation: Vertex Exam by:: Dr. Marice Potter    A&P: 33 y.o. O1V6153 [redacted]w[redacted]d here for SOL. #Labor: Progressing well. Cont Pit titration. AROM with light mec at this check. Anticipate SVD soon.  #Pain: Epidural #FWB: Cat I #GBS positive; adequate treatment  Chauncey Mann, MD 5:49 AM

## 2019-04-29 NOTE — Progress Notes (Signed)
Diamond Burton MRN: [id number]  Subjective: -Patient resting in bed.  Reports comfort with epidural.   Objective: BP 100/61   Pulse 64   Temp 98.1 F (36.7 C) (Oral)   Resp 18   Ht 5\' 6"  (1.676 m)   Wt 105.7 kg   SpO2 95%   BMI 37.61 kg/m  No intake/output data recorded. No intake/output data recorded.  Fetal Monitoring: FHT: 130 bpm, Mod Var, -Decels, +Accels UC: Q36min    Vaginal Exam: SVE:   Dilation: 7 Effacement (%): 100 Station: -1 Exam by:: Gavin Pound, CNM Membranes:Intact Internal Monitors: None  Augmentation/Induction: Pitocin:Initiated Cytotec: None  Assessment:  IUP at 39.3 weeks Cat I FT  GBS Positive  Plan: -Discussed amniotomy, but patient has not had 2nd dose of PCN yet. -Patient agreeable to initiation of pitocin and AROM at 4 hour after first PCN dose. -Patient without questions or concerns.  -Continue other mgmt as ordered.   Riley Churches, CNM Advanced Practice Provider, [facility] [place] [facility] 04/29/2019, 3:44 AM

## 2019-04-29 NOTE — Anesthesia Postprocedure Evaluation (Signed)
Anesthesia Post Note  Patient: Diamond Burton  Procedure(s) Performed: AN AD [place]     Patient location during evaluation: Mother Baby Anesthesia Type: Epidural Level of consciousness: awake Pain management: satisfactory to patient Vital Signs Assessment: post-procedure vital signs reviewed and stable Respiratory status: spontaneous breathing Cardiovascular status: stable Anesthetic complications: no    Last Vitals:  Vitals:   04/29/19 1100 04/29/19 1423  BP: 118/69 119/66  Pulse: 64 60  Resp: 16 18  Temp: 36.8 C 36.6 C  SpO2: 99% 99%    Last Pain:  Vitals:   04/29/19 1423  TempSrc: Oral  PainSc: 8    Pain Goal:                   [employer]

## 2019-04-29 NOTE — Discharge Summary (Signed)
Postpartum Discharge Summary     Patient Name: Diamond Burton DOB: 06-01-1986 MRN: [id number]  Date of admission: 04/28/2019 Delivering Provider: Gavin Pound   Date of discharge: 04/30/2019  Admitting diagnosis: CTX 20 mins spotting Intrauterine pregnancy: [redacted]w[redacted]d    Secondary diagnosis:  Active Problems:   History of herpes genitalis   Rh negative state in antepartum period   Group B streptococcal bacteriuria   Indication for care in labor or delivery   Normal vaginal delivery   Obstetric labial laceration, delivered, current hospitalization  Additional problems: None     Discharge diagnosis: Term Pregnancy Delivered                                                                                                Post partum procedures:NA  Augmentation: AROM and Pitocin  Complications: None  Hospital course:  Onset of Labor With Vaginal Delivery     [name] admitted in Active Labor on 04/28/2019.  Her contractions became inadequate and she was started on pitocin.  She was AROM'd and progressed to complete shortly after, but delivered without complications.   Membrane Rupture Time/Date: 5:43 AM ,04/29/2019   Intrapartum Procedures: Episiotomy: None [1]                                         Lacerations:  Labial [10]  Patient had a delivery of a Viable infant. 04/29/2019  Information for the patient's newborn:  Diamond, Burton[[id number]]Delivery Method: Vaginal, Spontaneous(Filed from Delivery Summary)     Pateint had an uncomplicated postpartum course.  She is ambulating, tolerating a regular diet, passing flatus, and urinating well. Patient is discharged home in stable condition on 04/30/19.  Delivery time: 7:35 AM    Magnesium Sulfate received: No BMZ received: No Rhophylac:Yes MMR:N/A Transfusion:No  Physical exam  Vitals:   04/29/19 1423 04/29/19 1806 04/29/19 2253 04/30/19 0511  BP: 119/66 121/76 117/72 118/78  Pulse: 60  65 61 62  Resp: _0 Temp: 97.8 F (36.6 C) 97.9 F (36.6 C) 97.8 F (36.6 C) 98.2 F (36.8 C)  TempSrc: Oral Oral Axillary Oral  SpO2: 99% 99% 100%   Weight:      Height:       General: alert, cooperative and no distress Lochia: appropriate Uterine Fundus: firm Incision: N/A DVT Evaluation: No evidence of DVT seen on physical exam. Labs: Lab Results  Component Value Date   WBC 9.2 04/29/2019   HGB 13.6 04/29/2019   HCT 39.1 04/29/2019   MCV 89.5 04/29/2019   PLT 267 04/29/2019   CMP Latest Ref Rng & Units 03/19/2019  Glucose 70 - 99 mg/dL 75  BUN 6 - 20 mg/dL 7  Creatinine 0.44 - 1.00 mg/dL 0.54  Sodium 135 - 145 mmol/L 134(L)  Potassium 3.5 - 5.1 mmol/L 3.6  Chloride 98 - 111 mmol/L 104  CO2 22 - 32 mmol/L 19(L)  Calcium 8.9 - 10.3 mg/dL 9.4  Total Protein 6.5 - 8.1 g/dL -  Total Bilirubin 0.3 - 1.2 mg/dL -  Alkaline Phos 38 - 126 U/L -  AST 15 - 41 U/L -  ALT 0 - 44 U/L -    Discharge instruction: per After Visit Summary and "Baby and Me Booklet".  After visit meds:  Allergies as of 04/30/2019      Reactions   Latex       Medication List    STOP taking these medications   aspirin EC 81 MG tablet   cephALEXin 500 MG capsule Commonly known as: KEFLEX   oxyCODONE 5 MG immediate release tablet Commonly known as: Roxicodone     TAKE these medications   Blood Pressure Kit Monitor BP at home regularly extra large cuff O09.90   [place] 1 Units by Does not apply route daily as needed.   ibuprofen 800 MG tablet Commonly known as: ADVIL Take 1 tablet (800 mg total) by mouth every 8 (eight) hours as needed.   Prenate Mini 18-0.6-0.4-350 MG Caps TAKE 1 CAPSULE BY MOUTH EVERY DAY BEFORE BREAKFAST   valACYclovir 1000 MG tablet Commonly known as: VALTREX Take 1,000 mg by mouth daily.   valACYclovir 500 MG tablet Commonly known as: VALTREX Take 1 tablet (500 mg total) by mouth 2 (two) times daily.       Diet:  routine diet  Activity: Advance as tolerated. Pelvic rest for 6 weeks.   Outpatient follow up:4 weeks Follow up Appt: Future Appointments  Date Time Provider [place]  06/03/2019 10:30 AM Constant, Vickii Chafe, MD [place] None   Follow up Visit: Appt Request Sent 04/30/2019 10:07 AM   Please schedule this patient for Postpartum visit in: 6 weeks with the following provider: Any provider For C/S patients schedule nurse incision check in weeks 2 weeks: no High risk pregnancy complicated by: Multiple STDs, HI/SI  Delivery mode:  SVD Anticipated Birth Control:  Considering Nexplanon, but unsure PP Procedures needed: None  Schedule Integrated BH visit: no      Newborn Data: Live born female-Kayden Birth Weight:   APGAR: 46, 9  Newborn Delivery   Birth date/time: 04/29/2019 07:35:00 Delivery type: Vaginal, Spontaneous      Baby Feeding: Bottle Disposition:home with mother   Marcille Buffy DNP, CNM  04/30/19  10:08 AM

## 2019-04-29 NOTE — Anesthesia Procedure Notes (Signed)
Epidural Patient location during procedure: OB Start time: 04/29/2019 1:18 AM End time: 04/29/2019 1:22 AM  Staffing Anesthesiologist: Janeece Riggers, MD  Preanesthetic Checklist Completed: patient identified, site marked, surgical consent, pre-op evaluation, timeout performed, IV checked, risks and benefits discussed and monitors and equipment checked  Epidural Patient position: sitting Prep: site prepped and draped and DuraPrep Patient monitoring: continuous pulse ox and blood pressure Approach: midline Location: L3-L4 Injection technique: LOR air  Needle:  Needle type: Tuohy  Needle gauge: 17 G Needle length: 9 cm and 9 Needle insertion depth: 9 cm Catheter type: closed end flexible Catheter size: 19 Gauge Catheter at skin depth: 10 cm Test dose: negative  Assessment Events: blood not aspirated, injection not painful, no injection resistance, negative IV test and no paresthesia

## 2019-04-30 MED ORDER — IBUPROFEN 800 MG PO TABS: 800.0000 mg | ORAL_TABLET | Freq: Three times a day (TID) | ORAL | 0 refills | Status: DC | PRN

## 2019-04-30 MED ORDER — LORAZEPAM 2 MG/ML IJ SOLN
INTRAMUSCULAR | Status: AC
  Filled 2019-04-30: qty 1

## 2019-04-30 NOTE — Clinical Social Work Maternal (Signed)
CLINICAL SOCIAL WORK MATERNAL/CHILD NOTE  Patient Details  Name: Diamond Burton MRN: [id number] Date of Birth: 04/12/1986  Date:  2018-11-25  Clinical Social Worker Initiating Note:  Elijio Miles Date/Time: Initiated:  04/30/19/0912     Child's Name:  Diamond Burton   Biological Parents:  Mother, Father(Shaquisha Gelin and Mariann Barter DOB: 02/15/1987)   Need for Interpreter:  None   Reason for Referral:  Behavioral Health Concerns, Current Substance Use/Substance Use During Pregnancy    Address:  [place] [address]    Phone number:  [phone] (home)     Additional phone number:   Household Members/Support Persons (HM/SP):   Household Member/Support Person 1, Household Member/Support Person 2, Household Member/Support Person 3, Household Member/Support Person 4   HM/SP Name Relationship DOB or Age  HM/SP -1 Taliyah Kindle Daughter 06/23/2003  HM/SP -2 Maliyah Gaffin-Jacobs Daughter 06/28/2011  HM/SP -3 Tristen [employer] Son 11/29/2016  HM/SP -4 Mariann Barter FOB 02/15/1987  HM/SP -5        HM/SP -6        HM/SP -7        HM/SP -8          Natural Supports (not living in the home):  (Father of 62-year-old)   Professional Supports: Therapist(MOB reported she has a Social worker and psychiatrist through [facility])   Employment: Unemployed(Waiting on disability)   Type of Work:     Education:  9 to 11 years   Homebound arranged:    Museum/gallery curator Resources:  Medicaid   Other Resources:  Physicist, medical (Intends to apply for [facility] and was provided with contact information)   Cultural/Religious Considerations Which May Impact Care:    Strengths:  Ability to meet basic needs , Home prepared for child , Pediatrician chosen   Psychotropic Medications:         Pediatrician:    [place]olic[place] area  Pediatrician List:   [facility] for [place]      Pediatrician Fax Number:    Risk Factors/Current Problems:  Mental Health Concerns , Substance Use    Cognitive State:  Able to Concentrate , Alert , Linear Thinking    Mood/Affect:  Bright , Calm , Comfortable , Interested , Happy , Relaxed    CSW Assessment:  CSW received consult for history of bipolar, depression, PTSD, suicidal and homicidal ideation and THC use during pregnancy. CSW met with MOB to offer support and complete assessment.    MOB sitting up bed bottle-feeding infant with FOB present at bedside, when CSW entered the room. CSW introduced self and received verbal permission from MOB to have FOB step out so that CSW could meet with MOB in private. FOB understanding and left voluntarily. CSW explained reason for consult to which MOB expressed understanding. MOB very pleasant and engaged throughout assessment and was observed to be appropriate and attentive to infant during visit. MOB reported she currently lives with her 3 other children in [place] and that FOB is now staying with them because of infant. MOB reported her 69 year old is currently at home with the 26-year-old and that her 10-year-old is with her father while MOB is in the hospital. MOB stated she is not currently employed but has applied for disability and is awaiting approval. MOB stated she receives food stamps and would like to apply for [facility]  and requested phone number to which CSW provided.   CSW inquired about MOB's mental health history and MOB shared that she has been through "a lot of trauma". MOB open with CSW and shared brief history of past trauma that MOB has been through starting at the age of 55. MOB stated she currently sees a psychiatrist through [facility] and was taking medications prior to finding out she was pregnant. MOB reported being formally diagnosed with bipolar and PTSD a year ago but has been experiencing symptoms for a while.  MOB stated she has a follow up appointment scheduled in December and that she has medications waiting for her at the pharmacy. CSW encouraged MOB to reach out to [facility] to inquire about if she can be seen earlier and to ensure psychiatrist still wants MOB to take the medications waiting at the pharmacy as they were suggested earlier in pregnancy. MOB agreeable to this and shared with CSW that the medications changed her life and reported having a positive experience. CSW inquired about how MOB has been since discontinuing her medications. MOB stated things have been "rough" and shared she had been experiencing suicidal thoughts during her pregnancy. MOB reported she started seeing a counselor through [facility] and sees him once a month. MOB expressed that this has been a positive experience for her and he has been helpful in reframing her thoughts. CSW inquired about if MOB has had any recent, or current, thoughts of suicide or homicide and MOB denied. MOB reported that her faith is very important to her and referenced this various times throughout assessment. CSW inquired about if MOB has had a history of PPD with previous pregnancies and MOB stated that she has and described symptoms of excessive crying. CSW provided education regarding the baby blues period vs. perinatal mood disorders, discussed treatment and gave resources for mental health follow up if concerns arise.  CSW recommends self-evaluation during the postpartum time period using the New Mom Checklist from Postpartum Progress and encouraged MOB to contact a medical professional if symptoms are noted at any time. MOB did not appear to be displaying any acute mental health symptoms and denied any current SI, HI or DV. MOB reported feeling well-supported by FOB and the father of her other son.   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 MOB shared she also had a surgery on her breast and that the marijuana helped with the pain. CSW informed MOB of [place] and explained CDS was still pending and a CPS report would be made, if warranted. MOB shared this happened with her other son and that case was closed. CSW inquired about incident noted in chart where her oldest daughter was missing. MOB reported that her daughter went off to see a girl and MOB did not approve of this and a police report was filed due to her being missing. MOB shared that a CPS report was also made because of this incident. CSW inquired about if case was still open and MOB reported it was closed about a month ago. CSW spoke with Wendall Stade with [facility] CPS regarding report and was informed case is still open and assigned to [employer]. CSW spoke with Caryl Pina who reported they intend to close case today and that there are no barriers to infant discharging to MOB once medically ready. CSW will continue  to follow CDS and make additional report, if necessary.   CSW aware MOB has been declining for blood to be drawn for infant to test for jaundice. Lab in the room at the beginning of assessment and had attempted to get a heel stick but MOB declined again. CSW inquired about reasoning for why MOB was hesitant about these tests. MOB shared that she is overprotective of her children and shared with CSW that she was born with Erb's palsy and that is why she has trust issues. Per MOB, she has follow up with [facility] in 3 days and will see what they think is wrong with infant. CSW inquired about if MOB was more trusting of her outpatient pediatrician and MOB stated "not really". CSW informed MOB that Pediatrician would be following up regarding testing and MOB expressed understanding.   MOB confirmed having all essential items for infant once discharged and reported infant would be sleeping in a crib once home. CSW provided review  of Sudden Infant Death Syndrome (SIDS) precautions and safe sleeping habits.    No barriers to discharge, at this time. CSW will continue to follow for support as long as MOB and/or infant are here in the hospital. CSW to continue to follow CDS and make CPS report, if warranted.   CSW Plan/Description:  Sudden Infant Death Syndrome (SIDS) Education, Perinatal Mood and Anxiety Disorder (PMADs) Education, No Further Intervention Required/No Barriers to Discharge, Hospital Drug Screen Policy Information, CSW Will Continue to Monitor Umbilical Cord Tissue Drug Screen Results and Make Report if Warranted    Elisea Khader, LCSWA 04/30/2019, 10:17 AM 

## 2019-04-30 NOTE — Progress Notes (Signed)
This RN realized that this patient went home early, without rhogam. Pt initially refused rhogam blood draw for workup this morning, but decided to get it mid morning. Blood bank didn't notify RN that rho was ready before DC, and this RN forgot.  MD notified who suggested the pt return to [facility] for Rhogam injection. This RN called the blood bank at [place] today, and rho is now ready. This RN called all 3 numbers on file and was not able to reach the patient. RN called back and left sms notification with unit number since the pt's voicemail box was full. Unit Network engineer and charge nurse notified

## 2019-05-01 ENCOUNTER — Encounter: Payer: Medicaid Other | Admitting: Family Medicine

## 2019-05-01 ENCOUNTER — Telehealth: Payer: Self-pay

## 2019-05-01 NOTE — Telephone Encounter (Signed)
TC from hospital regarding pt leaving without receiving Rhogam injection Pt has until today /tomorrow to report back to the hospital or come into the office. Pt did not answer and I was not able to leave a vm due to vm being full.

## 2019-05-04 LAB — RH IG WORKUP (INCLUDES ABO/RH)
ABO/RH(D): B NEG
Fetal Screen: NEGATIVE
Gestational Age(Wks): 39.3
Unit division: 0

## 2019-05-08 ENCOUNTER — Encounter: Payer: Medicaid Other | Admitting: Obstetrics and Gynecology

## 2019-05-14 ENCOUNTER — Other Ambulatory Visit: Payer: Self-pay | Admitting: Advanced Practice Midwife

## 2019-06-03 ENCOUNTER — Encounter: Payer: Self-pay | Admitting: Obstetrics and Gynecology

## 2019-06-03 ENCOUNTER — Ambulatory Visit (INDEPENDENT_AMBULATORY_CARE_PROVIDER_SITE_OTHER): Payer: Medicaid Other | Admitting: Obstetrics and Gynecology

## 2019-06-03 DIAGNOSIS — Z1389 Encounter for screening for other disorder: Secondary | ICD-10-CM | POA: Diagnosis not present

## 2019-06-03 DIAGNOSIS — O99345 Other mental disorders complicating the puerperium: Secondary | ICD-10-CM

## 2019-06-03 DIAGNOSIS — F53 Postpartum depression: Secondary | ICD-10-CM

## 2019-06-03 DIAGNOSIS — O0993 Supervision of high risk pregnancy, unspecified, third trimester: Secondary | ICD-10-CM

## 2019-06-03 MED ORDER — SERTRALINE HCL 50 MG PO TABS: 50.0000 mg | ORAL_TABLET | Freq: Every day | ORAL | 1 refills | Status: DC

## 2019-06-03 MED ORDER — SLYND 4 MG PO TABS: 1.0000 | ORAL_TABLET | Freq: Every day | ORAL | 4 refills | Status: DC

## 2019-06-03 NOTE — Progress Notes (Signed)
TELEHEALTH POSTPARTUM VIRTUAL VIDEO VISIT ENCOUNTER NOTE   Provider location: [facility] at [place]   I connected with [name] on 06/03/19 at 10:30 AM EST by Telephone Encounter at home and verified that I am speaking with the correct person using two identifiers.    I discussed the limitations, risks, security and privacy concerns of performing an evaluation and management service virtually and the availability of in person appointments. I also discussed with the patient that there may be a patient responsible charge related to this service. The patient expressed understanding and agreed to proceed.  Chief Complaint: Postpartum Visit  History of Present Illness: Diamond Burton is a 33 y.o. African-American [id number] being evaluated for postpartum followup.    She is s/p normal spontaneous vaginal delivery on 04/29/19 at 39 weeks; she was discharged to home on 04/30/19. Baby is doing well.  Complains of depression  Vaginal bleeding or discharge: Yes (went away and came back) Intercourse: Yes  Contraception: oral contraceptives (estrogen/progesterone) Mode of feeding infant: Bottle PP depression s/s: Yes . EPDS score: 14 Any bowel or bladder issues: No  Pap smear: no abnormalities (date: 12/19/2017)  Review of Systems: Her 12 point review of systems is negative or as noted in the History of Present Illness.  Patient Active Problem List   Diagnosis Date Noted  . Indication for care in labor or delivery 04/29/2019  . Normal vaginal delivery 04/29/2019  . Obstetric labial laceration, delivered, current hospitalization 04/29/2019  . Breast abscess 03/19/2019  . Group B streptococcal bacteriuria 10/18/2018  . Chlamydia infection affecting pregnancy in first trimester 10/10/2018  . Gonorrhea affecting pregnancy in first trimester 10/10/2018  . Supervision of high-risk pregnancy 10/08/2018  . Homicidal ideation 09/11/2018  . Suicidal ideation 09/11/2018   . 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 History of low birth weight infant 11/28/2016  . Rh negative state in antepartum period 08/02/2016  . Obesity in pregnancy, antepartum 08/02/2016  . History of herpes genitalis 06/01/2016  . History of maternal syphilis, currently pregnant 06/01/2016  . Hydradenitis     Medications Kesia Viana had no medications administered during this visit. Current Outpatient Medications  Medication Sig Dispense Refill  . Blood Pressure KIT Monitor BP at home regularly extra large cuff O09.90 1 kit 0  . Elastic Bandages & Supports (COMFORT FIT MATERNITY SUPP SM) MISC 1 Units by Does not apply route daily as needed. 1 each 0  . ibuprofen (ADVIL) 800 MG tablet Take 1 tablet (800 mg total) by mouth every 8 (eight) hours as needed. 30 tablet 0  . Prenat-FeCbn-FeAsp-Meth-FA-DHA (PRENATE MINI) 18-0.6-0.4-350 MG CAPS TAKE 1 CAPSULE BY MOUTH EVERY DAY BEFORE BREAKFAST 30 capsule 5  . valACYclovir (VALTREX) 1000 MG tablet Take 1,000 mg by mouth daily.    . valACYclovir (VALTREX) 500 MG tablet Take 1 tablet (500 mg total) by mouth 2 (two) times daily. 60 tablet 6   No current facility-administered medications for this visit.    Allergies Latex  Physical Exam:   General:  Alert, oriented and cooperative. Patient is in no acute distress.  Mental Status: Normal mood and affect. Normal behavior. Normal judgment and thought content.   Respiratory: Normal respiratory effort noted, no problems with respiration noted  Rest of physical exam deferred due to type of encounter  PP Depression Screening:   Edinburgh Postnatal Depression Scale Screening Tool 04/29/2019 01/24/2017  I have been able to laugh and see the funny side of things. 0 0  I have looked forward with enjoyment to things. 0 0  I have blamed myself unnecessarily when things went wrong. 0 0  I have been anxious or worried for no good reason. 0 1  I have felt scared or panicky for no good reason. 0 0   Things have been getting on top of me. 1 0  I have been so unhappy that I have had difficulty sleeping. 2 1  I have felt sad or miserable. 1 0  I have been so unhappy that I have been crying. 1 0  The thought of harming myself has occurred to me. 0 0  Edinburgh Postnatal Depression Scale Total 5 2     Assessment:Patient is a 33 y.o. [name] who is 4 weeks postpartum from a normal spontaneous vaginal delivery with postpartum depression.  She is doing well and is scheduled to follow up with her therapist on 12/31. She denies suicidal/homicidal ideations. Rx zoloft has been prescribed.  Note in chart indicating that patient did not receive rhogam prior to discharge. Patient is uncertain as to whether she received rhogam. Will contact the hospital to determine if it was received    RTC 6 months for annual exam  I discussed the assessment and treatment plan with the patient. The patient was provided an opportunity to ask questions and all were answered. The patient agreed with the plan and demonstrated an understanding of the instructions.   The patient was advised to call back or seek an in-person evaluation/go to the ED for any concerning postpartum symptoms.  I provided 15 minutes of face-to-face time during this encounter.   Cleotilde Neer, RN [facility], [place]

## 2019-07-04 IMAGING — CT CT ABD-PELV W/ CM
2 of 4 series · 17 of 46 positions shown, 19 images · IV contrast (ISOVUE)
Comparison: 09/06/2014 CT abdomen and pelvis.

CLINICAL DATA: 30 y/o  F; lower abdominal pain worse on the left.

EXAM:
CT ABDOMEN AND PELVIS WITH CONTRAST
TECHNIQUE: Multidetector CT imaging of the abdomen and pelvis was performed
using the standard protocol following bolus administration of
intravenous contrast.
CONTRAST:  100mL [id number]-UBB IOPAMIDOL (97YM15-UBB) INJECTION 61%

[Series 2: abd/pel with · axial · 0.74mm/px · z∈[-438,-28]mm · 14 of 92 slices shown, 16 images]
[im 5/92  soft-tissue]
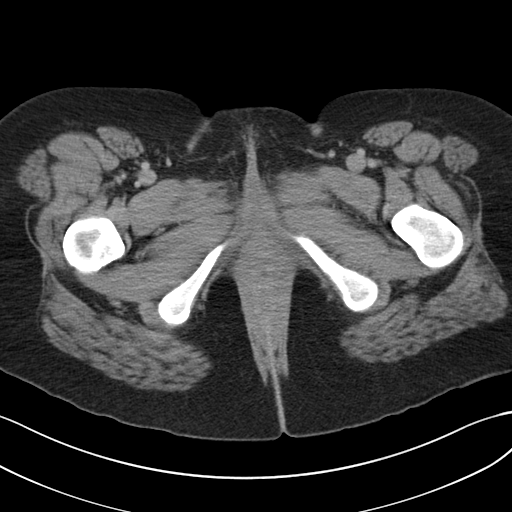
[im 5/92  bone]
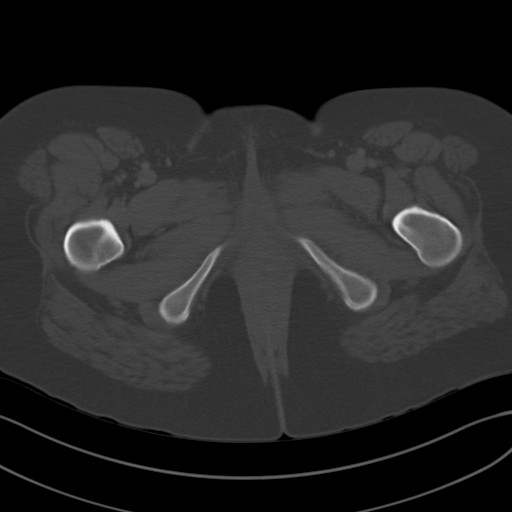
[im 14/92  soft-tissue]
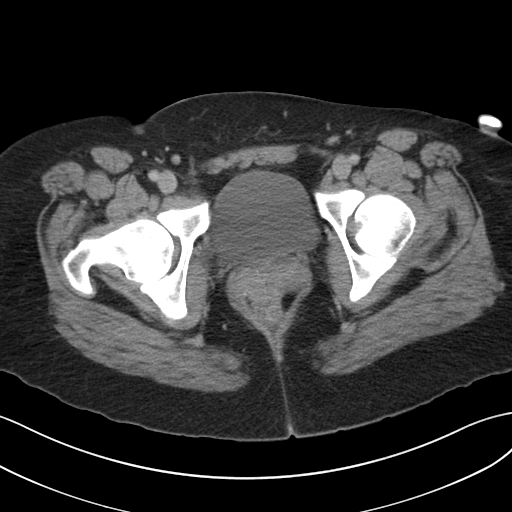
[im 18/92  soft-tissue]
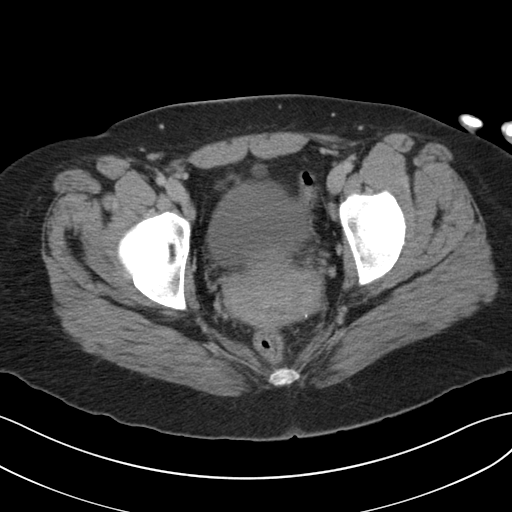
[im 27/92  soft-tissue]
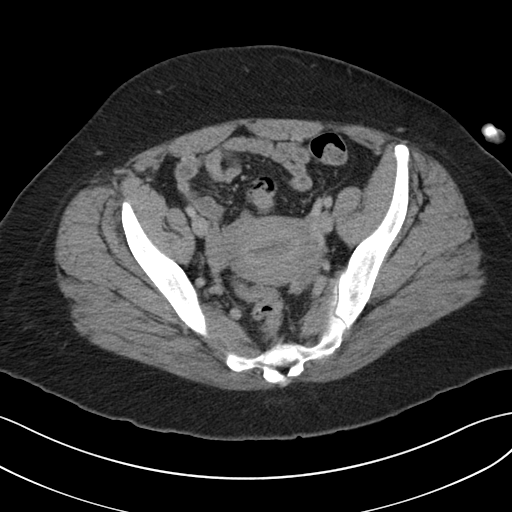
[im 31/92  soft-tissue]
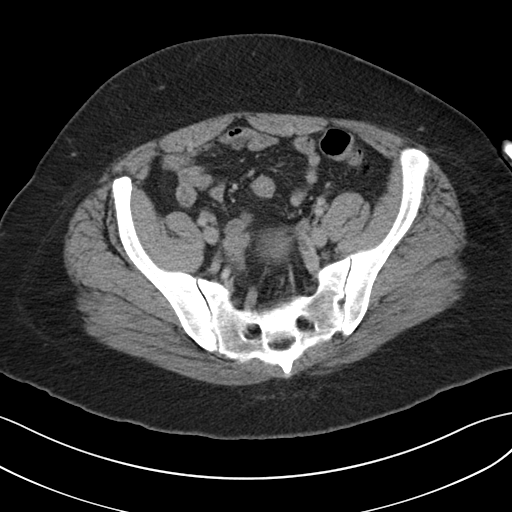
[im 35/92  soft-tissue]
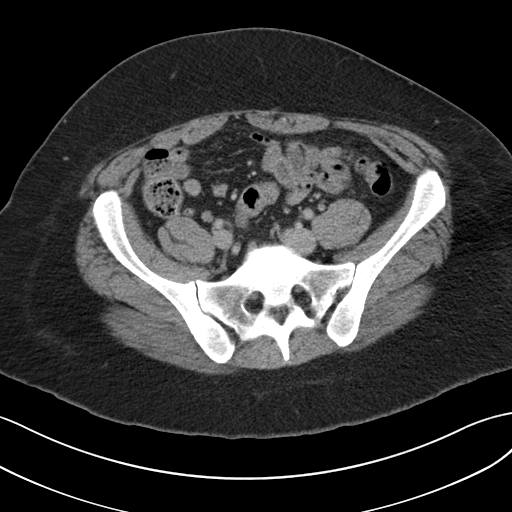
[im 44/92  soft-tissue]
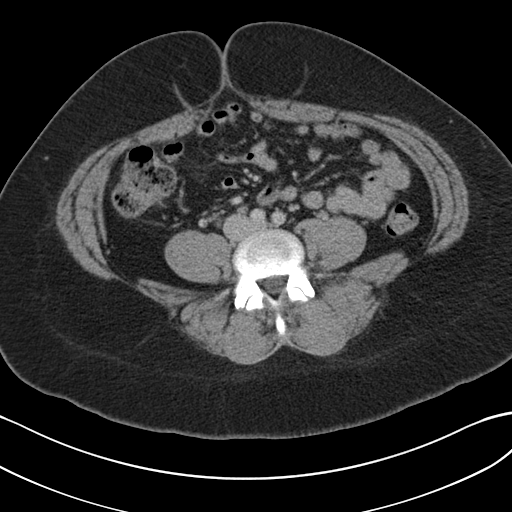
[im 48/92  soft-tissue]
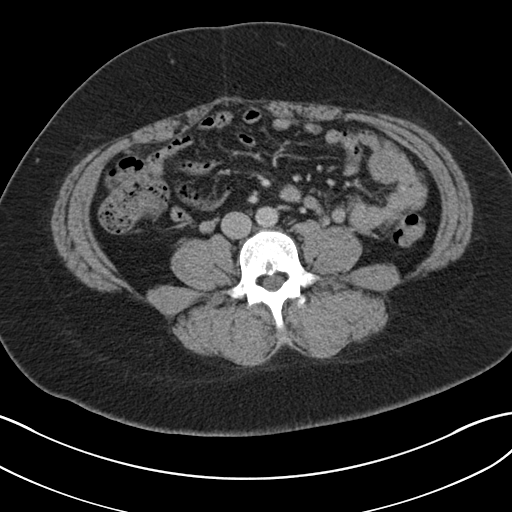
[im 57/92  soft-tissue]
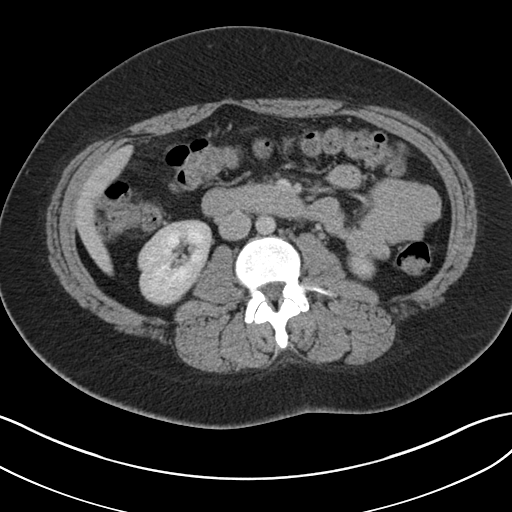
[im 57/92  bone]
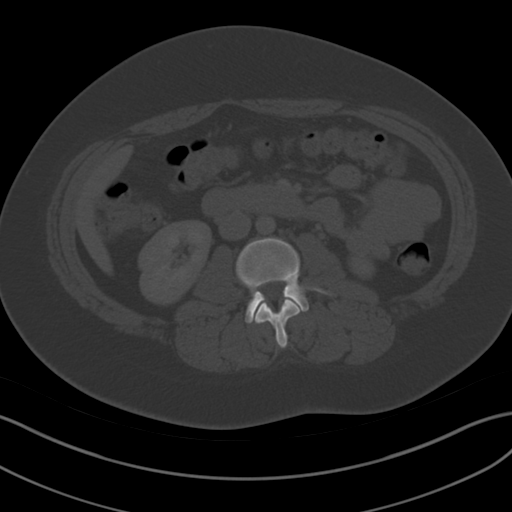
[im 61/92  soft-tissue]
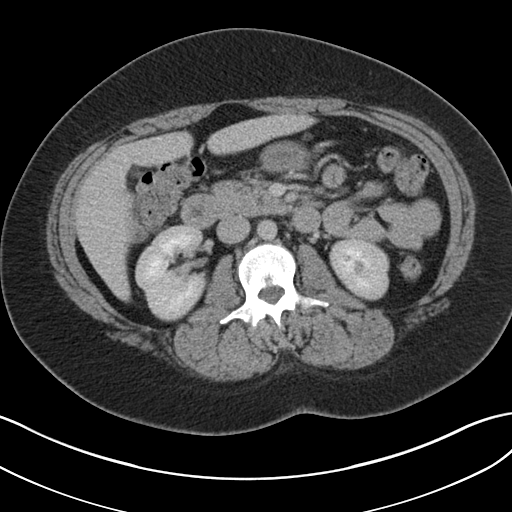
[im 70/92  soft-tissue]
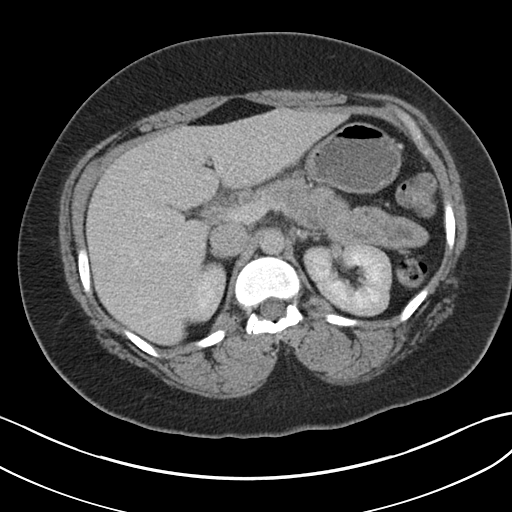
[im 74/92  soft-tissue]
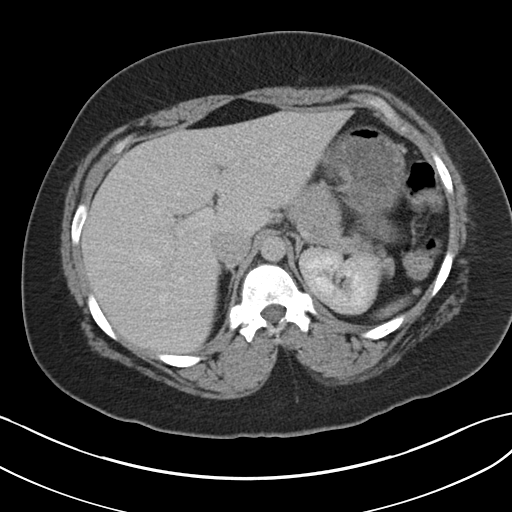
[im 79/92  soft-tissue]
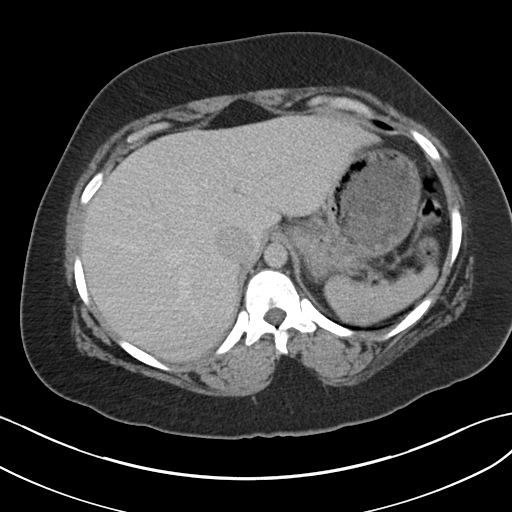
[im 87/92  soft-tissue]
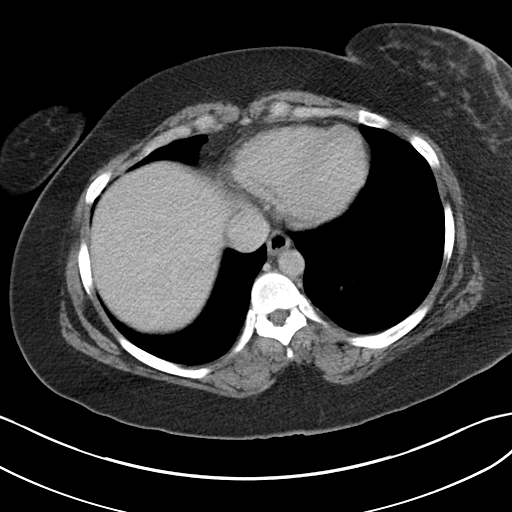

[Series 3: coronal a/|p · coronal · 0.90mm/px · 3 of 134 slices shown]
[im 45/134  soft-tissue]
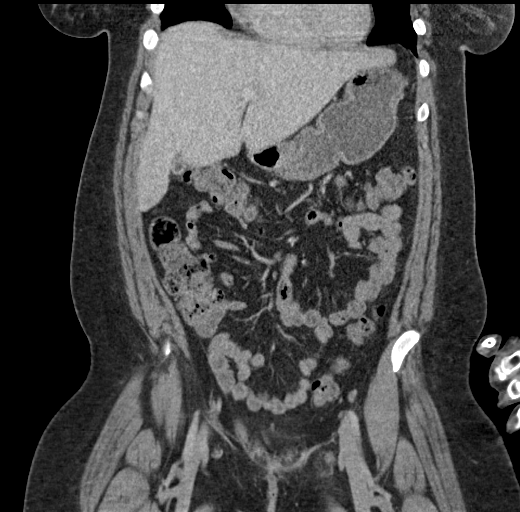
[im 60/134  soft-tissue]
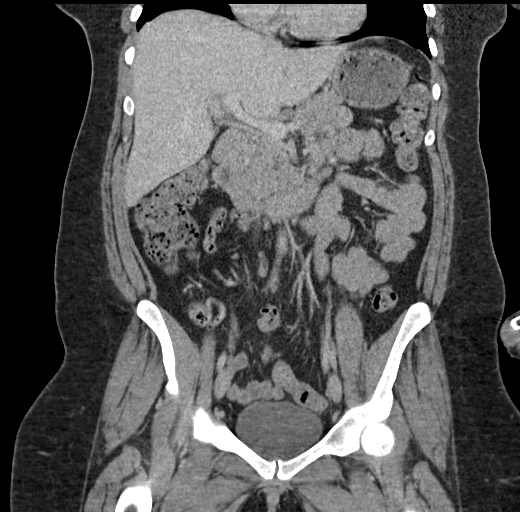
[im 74/134  soft-tissue]
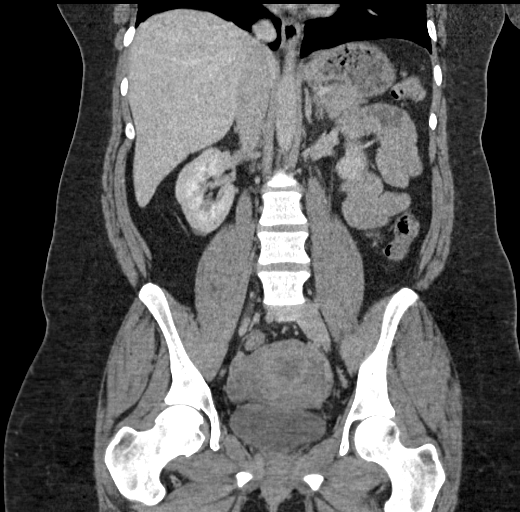

[17 of 46 positions shown; findings below may reference images not displayed]

FINDINGS: Lower chest: No acute abnormality.

Hepatobiliary: Segment 5 subcentimeter lucency, likely a cyst. No
other focal liver lesion. Normal gallbladder. No intra or
extrahepatic biliary ductal dilatation.

Pancreas: Unremarkable. No pancreatic ductal dilatation or
surrounding inflammatory changes.

Spleen: Normal in size without focal abnormality.

Adrenals/Urinary Tract: Normal adrenal glands. Left kidney
interpolar 9 mm cyst. Left kidney lower pole nonobstructing 6 mm
nephrolithiasis. No hydronephrosis or ureter stone. Normal bladder.

Stomach/Bowel: Stomach is within normal limits. Appendix appears
normal. No evidence of bowel wall thickening, distention, or
inflammatory changes.

Vascular/Lymphatic: No significant vascular findings are present. No
enlarged abdominal or pelvic lymph nodes.

Reproductive: Uterus and bilateral adnexa are unremarkable.

Other: No abdominal wall hernia or abnormality. Trace fluid in the
cul-de-sac, likely physiologic.

Musculoskeletal: No acute or significant osseous findings.
IMPRESSION: 1. No acute process identified.
2. Stable left kidney lower pole nonobstructing nephrolithiasis.

By: Jiten Bostick M.D.

## 2019-07-10 ENCOUNTER — Emergency Department (HOSPITAL_COMMUNITY)
Admission: EM | Admit: 2019-07-10 | Discharge: 2019-07-10 | Discharge status: Against Medical Advice | Payer: Medicaid Other | Attending: Emergency Medicine | Admitting: Emergency Medicine

## 2019-07-10 DIAGNOSIS — Z87891 Personal history of nicotine dependence: Secondary | ICD-10-CM | POA: Insufficient documentation

## 2019-07-10 DIAGNOSIS — Z91040 Latex allergy status: Secondary | ICD-10-CM | POA: Diagnosis not present

## 2019-07-10 DIAGNOSIS — H538 Other visual disturbances: Secondary | ICD-10-CM | POA: Diagnosis not present

## 2019-07-10 DIAGNOSIS — R42 Dizziness and giddiness: Secondary | ICD-10-CM | POA: Insufficient documentation

## 2019-07-10 DIAGNOSIS — Z79899 Other long term (current) drug therapy: Secondary | ICD-10-CM | POA: Diagnosis not present

## 2019-07-10 MED ORDER — SODIUM CHLORIDE 0.9 % IV BOLUS: 1000.0000 mL | Freq: Once | INTRAVENOUS | Status: DC

## 2019-07-10 MED ORDER — ONDANSETRON HCL 4 MG/2ML IJ SOLN: 4.0000 mg | Freq: Once | INTRAMUSCULAR | Status: DC

## 2019-07-10 MED ORDER — MORPHINE SULFATE (PF) 4 MG/ML IV SOLN: 6.0000 mg | Freq: Once | INTRAVENOUS | Status: DC

## 2019-07-10 NOTE — ED Notes (Signed)
Pt states they cannot find her baby and she's going to find it.  PA notified.

## 2019-07-10 NOTE — Discharge Instructions (Addendum)
You have chosen to leave AGAINST MEDICAL ADVICE. Should you change your mind, you are always welcome and encouraged to return to the ED. You are encouraged to follow-up with, at the very least, a primary care provider, or other similar medical professional on this matter.  

## 2019-07-10 NOTE — ED Notes (Addendum)
Pt notified that baby has been found as well.  However, she would just like to go home and hold her baby.  She states she will not go home to her apartment tonight, but will stay with family.
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
## 2019-09-20 ENCOUNTER — Ambulatory Visit: Payer: Medicaid Other

## 2019-10-17 ENCOUNTER — Other Ambulatory Visit: Payer: Self-pay

## 2019-10-17 ENCOUNTER — Emergency Department (HOSPITAL_COMMUNITY)
Admission: EM | Admit: 2019-10-17 | Discharge: 2019-10-17 | Disposition: A | Discharge status: Home / Self Care | Payer: Medicaid Other | Attending: Emergency Medicine | Admitting: Emergency Medicine

## 2019-10-17 DIAGNOSIS — F1290 Cannabis use, unspecified, uncomplicated: Secondary | ICD-10-CM | POA: Diagnosis not present

## 2019-10-17 DIAGNOSIS — R4182 Altered mental status, unspecified: Secondary | ICD-10-CM | POA: Diagnosis present

## 2019-10-17 DIAGNOSIS — R1110 Vomiting, unspecified: Secondary | ICD-10-CM | POA: Diagnosis not present

## 2019-10-17 DIAGNOSIS — Z79899 Other long term (current) drug therapy: Secondary | ICD-10-CM | POA: Diagnosis not present

## 2019-10-17 DIAGNOSIS — Z87891 Personal history of nicotine dependence: Secondary | ICD-10-CM | POA: Insufficient documentation

## 2019-10-17 DIAGNOSIS — R1084 Generalized abdominal pain: Secondary | ICD-10-CM | POA: Diagnosis not present

## 2019-10-17 DIAGNOSIS — R197 Diarrhea, unspecified: Secondary | ICD-10-CM | POA: Diagnosis not present

## 2019-10-17 DIAGNOSIS — E860 Dehydration: Secondary | ICD-10-CM | POA: Diagnosis not present

## 2019-10-17 LAB — CBC
HCT: 54.3 % — ABNORMAL HIGH (ref 36.0–46.0)
Hemoglobin: 17.9 g/dL — ABNORMAL HIGH (ref 12.0–15.0)
MCH: 28.8 pg (ref 26.0–34.0)
MCHC: 33 g/dL (ref 30.0–36.0)
MCV: 87.4 fL (ref 80.0–100.0)
Platelets: 348 10*3/uL (ref 150–400)
RBC: 6.21 MIL/uL — ABNORMAL HIGH (ref 3.87–5.11)
RDW: 13.2 % (ref 11.5–15.5)
WBC: 10.2 10*3/uL (ref 4.0–10.5)
nRBC: 0 % (ref 0.0–0.2)

## 2019-10-17 LAB — COMPREHENSIVE METABOLIC PANEL
ALT: 15 U/L (ref 0–44)
AST: 31 U/L (ref 15–41)
Albumin: 4.7 g/dL (ref 3.5–5.0)
Alkaline Phosphatase: 121 U/L (ref 38–126)
Anion gap: 15 (ref 5–15)
BUN: 12 mg/dL (ref 6–20)
CO2: 15 mmol/L — ABNORMAL LOW (ref 22–32)
Calcium: 10.4 mg/dL — ABNORMAL HIGH (ref 8.9–10.3)
Chloride: 102 mmol/L (ref 98–111)
Creatinine, Ser: 0.9 mg/dL (ref 0.44–1.00)
GFR calc Af Amer: >60 mL/min (ref >60)
GFR calc non Af Amer: >60 mL/min (ref >60)
Glucose, Bld: 150 mg/dL — ABNORMAL HIGH (ref 70–99)
Potassium: 4.7 mmol/L (ref 3.5–5.1)
Sodium: 132 mmol/L — ABNORMAL LOW (ref 135–145)
Total Bilirubin: 1.1 mg/dL (ref 0.3–1.2)
Total Protein: 9.7 g/dL — ABNORMAL HIGH (ref 6.5–8.1)

## 2019-10-17 LAB — SALICYLATE LEVEL: Salicylate Lvl: <7 mg/dL — ABNORMAL LOW (ref 7.0–30.0)

## 2019-10-17 LAB — POCT I-STAT EG7
Acid-Base Excess: 1 mmol/L (ref 0.0–2.0)
Bicarbonate: 20 mmol/L (ref 20.0–28.0)
Calcium, Ion: 1.08 mmol/L — ABNORMAL LOW (ref 1.15–1.40)
HCT: 57 % — ABNORMAL HIGH (ref 36.0–46.0)
Hemoglobin: 19.4 g/dL — ABNORMAL HIGH (ref 12.0–15.0)
O2 Saturation: 100 %
Potassium: 5.1 mmol/L (ref 3.5–5.1)
Sodium: 134 mmol/L — ABNORMAL LOW (ref 135–145)
TCO2: 21 mmol/L — ABNORMAL LOW (ref 22–32)
pCO2, Ven: 22.4 mmHg — ABNORMAL LOW (ref 44.0–60.0)
pH, Ven: 7.557 — ABNORMAL HIGH (ref 7.250–7.430)
pO2, Ven: 166 mmHg — ABNORMAL HIGH (ref 32.0–45.0)

## 2019-10-17 LAB — LACTIC ACID, PLASMA
Lactic Acid, Venous: 2.4 mmol/L (ref 0.5–1.9)
Lactic Acid, Venous: 1.6 mmol/L (ref 0.5–1.9)

## 2019-10-17 LAB — LIPASE, BLOOD: Lipase: 40 U/L (ref 11–51)

## 2019-10-17 LAB — I-STAT BETA HCG BLOOD, ED (MC, WL, AP ONLY): I-stat hCG, quantitative: <5 m[IU]/mL (ref <5)

## 2019-10-17 LAB — CBG MONITORING, ED: Glucose-Capillary: 142 mg/dL — ABNORMAL HIGH (ref 70–99)

## 2019-10-17 MED ORDER — LACTATED RINGERS IV BOLUS
1000.0000 mL | Freq: Once | INTRAVENOUS | Status: AC
  Administered 2019-10-17: 12:00:00 1000 mL via INTRAVENOUS

## 2019-10-17 MED ORDER — PROMETHAZINE HCL 25 MG/ML IJ SOLN
25.0000 mg | Freq: Once | INTRAMUSCULAR | Status: AC
  Administered 2019-10-17: 25 mg via INTRAVENOUS
  Filled 2019-10-17: qty 1

## 2019-10-17 MED ORDER — ONDANSETRON HCL 4 MG/2ML IJ SOLN
4.0000 mg | Freq: Once | INTRAMUSCULAR | Status: AC | PRN
  Administered 2019-10-17: 12:00:00 4 mg via INTRAVENOUS
  Filled 2019-10-17: qty 2

## 2019-10-17 MED ORDER — SODIUM CHLORIDE 0.9% FLUSH: 3.0000 mL | Freq: Once | INTRAVENOUS | Status: DC

## 2019-10-17 MED ORDER — HALOPERIDOL LACTATE 5 MG/ML IJ SOLN
5.0000 mg | Freq: Once | INTRAMUSCULAR | Status: AC
  Administered 2019-10-17: 14:00:00 5 mg via INTRAVENOUS
  Filled 2019-10-17: qty 1

## 2019-10-17 MED ORDER — LACTATED RINGERS IV BOLUS
1000.0000 mL | Freq: Once | INTRAVENOUS | Status: AC
  Administered 2019-10-17: 14:00:00 1000 mL via INTRAVENOUS

## 2019-10-17 MED ORDER — ONDANSETRON 4 MG PO TBDP
4.0000 mg | ORAL_TABLET | Freq: Three times a day (TID) | ORAL | 0 refills | Status: DC | PRN
Start: 2019-10-17 — End: 2020-09-09

## 2019-10-17 NOTE — ED Notes (Signed)
Patient able to move around; assisted to the bathroom to get cleaned up.

## 2019-10-17 NOTE — ED Provider Notes (Signed)
Care of the patient was assumed from Dr. Criss Alvine at 1500; see this physician's note for complete history of present illness, review of systems, and physical exam.  Briefly, the patient is a 34 y.o. female who presented to the ED with vomiting and dehydration.  History significant for N/V/Dehydration; hx of marijuana use.  Plan at time of handoff:  -This patient has gotten Zofran, Phenergan, Haldol.  She is on her second liter of IVF. -Reassess and p.o. challenge.  MDM/ED Course: On my initial exam, the patient was sleeping, but arousable.  Vital signs were normal.  Upon reassessment, patient was awake and alert.  She reported resolution of her nausea.  Abdomen was soft and nontender.  Patient endorsed thirst and was able to drink and eat ice chips without any difficulty.  At this point, she requested discharge home.  She was informed of lab abnormalities that were present upon arrival.  She declined repeat labs, stating that she had to get home to her 95-month-old son.  She reported that her symptoms were resolved and stated that if they are to return, she will come back to the hospital.  Prescription for Zofran was provided, to be taken as needed.  Patient was discharged in stable condition.  Patient seen in conjunction with the attending physician, Dr. Adela Lank, MD, who participated in all aspects of the patient's care and was in agreement with the above plan.   Emergency Department Medication Summary: Medications  ondansetron [facility]) injection 4 mg (4 mg Intravenous Given 10/17/19 1207)  lactated ringers bolus 1,000 mL (0 mLs Intravenous Stopped 10/17/19 1330)  promethazine (PHENERGAN) injection 25 mg (25 mg Intravenous Given 10/17/19 1252)  lactated ringers bolus 1,000 mL (0 mLs Intravenous Stopped 10/17/19 1637)  haloperidol lactate (HALDOL) injection 5 mg (5 mg Intravenous Given 10/17/19 1422)   Clinical Impression: 1. Dehydration      Gloris Manchester, MD 10/18/19 0419    Melene Plan, DO 10/18/19  1503

## 2019-10-17 NOTE — ED Provider Notes (Signed)
[facility] EMERGENCY DEPARTMENT Provider Note   CSN: [id number] Arrival date & time: 10/17/19  1143     History Chief Complaint  Patient presents with  . Altered Mental Status    Diamond Burton is a 34 y.o. female.  HPI 34 year old female presents with vomiting and diarrhea.  Started this morning.  Numerous episodes of both.  The vomiting probably started more like last night but has had numerous episodes.  No blood in either.  Has diffuse abdominal pain as well.  No fevers or sick contacts.  She was rushed back to the ED treatment room as she was taken out of the car and seemed to be altered.  When I am evaluating her she is not altered and is able to answer all questions appropriately.    Past Medical History:  Diagnosis Date  . Abnormal Pap smear   . Bipolar disorder ([place])   . Chlamydia infection affecting pregnancy in first trimester 10/10/2018  . Depression    stopped meds, was wanting to see counselor  . Eczema   . Erb's palsy    L arm  . Erb's palsy    left arm  . Gonorrhea affecting pregnancy in first trimester 10/10/2018  . Herpes    frequent outbreaks on valtrex 500  . History of chlamydia   . 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 History of gonorrhea   . Hydradenitis   . Infection    UTI  . Kidney stones   . Ovarian cyst   . Periodontal disease   . PID (pelvic inflammatory disease)   . PTSD (post-traumatic stress disorder)   . Trichomonas contact     Patient Active Problem List   Diagnosis Date Noted  . Indication for care in labor or delivery 04/29/2019  . Normal vaginal delivery 04/29/2019  . Obstetric labial laceration, delivered, current hospitalization 04/29/2019  . Breast abscess 03/19/2019  . Group B streptococcal bacteriuria 10/18/2018  . Chlamydia infection affecting pregnancy in first trimester 10/10/2018  . Gonorrhea affecting pregnancy in first trimester 10/10/2018  . Supervision of high-risk pregnancy  10/08/2018  . Homicidal ideation 09/11/2018  . Suicidal ideation 09/11/2018  . 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 History of low birth weight infant 11/28/2016  . Rh negative state in antepartum period 08/02/2016  . Obesity in pregnancy, antepartum 08/02/2016  . History of herpes genitalis 06/01/2016  . History of maternal syphilis, currently pregnant 06/01/2016  . Hydradenitis     Past Surgical History:  Procedure Laterality Date  . HYDRADENITIS EXCISION  03/2019  . IRRIGATION AND DEBRIDEMENT ABSCESS Left 03/20/2019   Procedure: IRRIGATION AND DEBRIDEMENT BREAST ABSCESS;  Surgeon: Coralie Keens, MD;  Location: [facility];  Service: General;  Laterality: Left;  . NO PAST SURGERIES       OB History    Gravida  5   Para  4   Term  4   Preterm      AB  1   Living  4     SAB  1   TAB      Ectopic      Multiple  0   Live Births  4           Family History  Problem Relation Age of Onset  . Depression Mother   . Diabetes Mother   . Hypertension Mother   . Heart disease Mother   . Asthma Sister   . Asthma Daughter   .  Hypertension Maternal Grandmother   . Diabetes Maternal Grandmother     Social History   Tobacco Use  . Smoking status: Former Smoker    Packs/day: 0.50    Years: 2.00    Pack years: 1.00    Types: Cigarettes  . Smokeless tobacco: Never Used  Substance Use Topics  . Alcohol use: No    Comment: occ  . Drug use: No    Home Medications Prior to Admission medications   Medication Sig Start Date End Date Taking? Authorizing Provider  Blood Pressure KIT Monitor BP at home regularly extra large cuff O09.90 Patient not taking: Reported on 06/03/2019 01/07/19   Truett Mainland, DO  Drospirenone (SLYND) 4 MG TABS Take 1 tablet by mouth daily. 06/03/19   Constant, Peggy, MD  Elastic Bandages & Supports (COMFORT FIT MATERNITY SUPP SM) MISC 1 Units by Does not apply route daily as needed. Patient not taking: Reported on 06/03/2019 03/19/19    Nugent, Gerrie Nordmann, NP  ibuprofen (ADVIL) 800 MG tablet Take 1 tablet (800 mg total) by mouth every 8 (eight) hours as needed. Patient not taking: Reported on 06/03/2019 04/30/19   Marcille Buffy D, CNM  Prenat-FeCbn-FeAsp-Meth-FA-DHA (PRENATE MINI) 18-0.6-0.4-350 MG CAPS TAKE 1 CAPSULE BY MOUTH EVERY DAY BEFORE BREAKFAST 04/22/19   Constant, Peggy, MD  sertraline (ZOLOFT) 50 MG tablet Take 1 tablet (50 mg total) by mouth daily. 06/03/19   Constant, Peggy, MD  valACYclovir (VALTREX) 1000 MG tablet Take 1,000 mg by mouth daily. 09/11/18   [provider]  valACYclovir (VALTREX) 500 MG tablet Take 1 tablet (500 mg total) by mouth 2 (two) times daily. 04/16/19   Sloan Leiter, MD    Allergies    Latex  Review of Systems   Review of Systems  Constitutional: Negative for fever.  Gastrointestinal: Positive for abdominal pain, diarrhea, nausea and vomiting. Negative for blood in stool.  All other systems reviewed and are negative.   Physical Exam Updated Vital Signs BP (!) 152/58   Pulse (!) 55   Temp 97.8 F (36.6 C) (Oral)   Resp (!) 21   SpO2 98%   Physical Exam Vitals and nursing note reviewed.  Constitutional:      Appearance: She is well-developed. She is diaphoretic.  HENT:     Head: Normocephalic and atraumatic.     Right Ear: External ear normal.     Left Ear: External ear normal.     Nose: Nose normal.  Eyes:     General:        Right eye: No discharge.        Left eye: No discharge.  Cardiovascular:     Rate and Rhythm: Normal rate and regular rhythm.     Heart sounds: Normal heart sounds.  Pulmonary:     Effort: Pulmonary effort is normal.     Breath sounds: Normal breath sounds.  Abdominal:     General: There is no distension.     Palpations: Abdomen is soft.     Tenderness: There is no abdominal tenderness.  Skin:    General: Skin is warm.  Neurological:     Mental Status: She is alert.  Psychiatric:        Mood and Affect: Mood is not anxious.      ED Results / Procedures / Treatments   Labs (all labs ordered are listed, but only abnormal results are displayed) Labs Reviewed  COMPREHENSIVE METABOLIC PANEL - Abnormal; Notable for the following components:  Result Value   Sodium 132 (*)    CO2 15 (*)    Glucose, Bld 150 (*)    Calcium 10.4 (*)    Total Protein 9.7 (*)    All other components within normal limits  CBC - Abnormal; Notable for the following components:   RBC 6.21 (*)    Hemoglobin 17.9 (*)    HCT 54.3 (*)    All other components within normal limits  LACTIC ACID, PLASMA - Abnormal; Notable for the following components:   Lactic Acid, Venous 2.4 (*)    All other components within normal limits  SALICYLATE LEVEL - Abnormal; Notable for the following components:   Salicylate Lvl <1.6 (*)    All other components within normal limits  CBG MONITORING, ED - Abnormal; Notable for the following components:   Glucose-Capillary 142 (*)    All other components within normal limits  POCT I-STAT EG7 - Abnormal; Notable for the following components:   pH, Ven 7.557 (*)    pCO2, Ven 22.4 (*)    pO2, Ven 166.0 (*)    TCO2 21 (*)    Sodium 134 (*)    Calcium, Ion 1.08 (*)    HCT 57.0 (*)    Hemoglobin 19.4 (*)    All other components within normal limits  LIPASE, BLOOD  URINALYSIS, ROUTINE W REFLEX MICROSCOPIC  LACTIC ACID, PLASMA  I-STAT BETA HCG BLOOD, ED ([facility], [facility], [facility] ONLY)    EKG EKG Interpretation  Date/Time:  Thursday Oct 17 2019 11:51:48 EDT Ventricular Rate:  67 PR Interval:    QRS Duration: 101 QT Interval:  386 QTC Calculation: 408 R Axis:   81 Text Interpretation: Sinus rhythm ST elev, probable normal early repol pattern Baseline wander in lead(s) II III aVR aVF V3 V5 no significant change since 2017 Confirmed by Sherwood Gambler [phone]) on 10/17/2019 11:54:06 AM   Radiology No results found.  Procedures Procedures (including critical care time)  Medications Ordered in ED Medications   sodium chloride flush (NS) 0.9 % injection 3 mL (3 mLs Intravenous Not Given 10/17/19 1252)  ondansetron (ZOFRAN) injection 4 mg (4 mg Intravenous Given 10/17/19 1207)  lactated ringers bolus 1,000 mL (0 mLs Intravenous Stopped 10/17/19 1330)  promethazine (PHENERGAN) injection 25 mg (25 mg Intravenous Given 10/17/19 1252)  lactated ringers bolus 1,000 mL (1,000 mLs Intravenous New Bag/Given 10/17/19 1330)  haloperidol lactate (HALDOL) injection 5 mg (5 mg Intravenous Given 10/17/19 1422)    ED Course  I have reviewed the triage vital signs and the nursing notes.  Pertinent labs & imaging results that were available during my care of the patient were reviewed by me and considered in my medical decision making (see chart for details).    MDM Rules/Calculators/A&P                      After Zofran and Phenergan, patient still complaining of nausea.  Further history elucidates that she does use marijuana daily.  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 Abdominal exam is benign. Low bicarbonate is likely from vomiting/diarrhea. Care transferred to Dr. Tyrone Nine with haldol treatment and 2nd IVF bolus pending. Final Clinical Impression(s) / ED Diagnoses Final diagnoses:  None    Rx / DC Orders ED Discharge Orders    None       Sherwood Gambler, MD 10/17/19 1530

## 2019-10-17 NOTE — ED Triage Notes (Signed)
Patient arrived via POV: c/o AMS along w/ N/V/D; Reported potentially taking some medication prior to the symptoms. Pt in room VSS; clammy; unable to answer questions, awake.

## 2020-01-16 ENCOUNTER — Emergency Department (HOSPITAL_COMMUNITY)
Admission: EM | Admit: 2020-01-16 | Discharge: 2020-01-16 | Discharge status: Against Medical Advice | Payer: Medicaid Other

## 2020-01-16 ENCOUNTER — Other Ambulatory Visit: Payer: Self-pay

## 2020-05-03 ENCOUNTER — Other Ambulatory Visit: Payer: Self-pay | Admitting: Obstetrics and Gynecology

## 2020-06-13 NOTE — L&D Delivery Note (Signed)
Diamond Burton delivered a non viable 45XO fetus at 0712 no signs of life, severe hydrops is noted Apgars 0/0/0 Weight pending Placenta at 0721 intact with traction, appropriate bleeding Methergine 0.2 mg IM given Uterus is firm  Fetus to pathology per patient request  Lazaro Arms, MD 04/22/2021 7:24 AM

## 2020-07-15 ENCOUNTER — Ambulatory Visit (INDEPENDENT_AMBULATORY_CARE_PROVIDER_SITE_OTHER): Payer: Medicaid Other | Admitting: Obstetrics

## 2020-07-15 ENCOUNTER — Other Ambulatory Visit (HOSPITAL_COMMUNITY)
Admission: RE | Admit: 2020-07-15 | Discharge: 2020-07-15 | Disposition: A | Discharge status: Home / Self Care | Payer: Medicaid Other | Source: Ambulatory Visit | Attending: Obstetrics | Admitting: Obstetrics

## 2020-07-15 ENCOUNTER — Other Ambulatory Visit: Payer: Self-pay

## 2020-07-15 ENCOUNTER — Encounter: Payer: Self-pay | Admitting: Obstetrics

## 2020-07-15 VITALS — BP 112/68 | HR 65 | Wt 206.0 lb

## 2020-07-15 DIAGNOSIS — N730 Acute parametritis and pelvic cellulitis: Secondary | ICD-10-CM

## 2020-07-15 DIAGNOSIS — B3731 Acute candidiasis of vulva and vagina: Secondary | ICD-10-CM

## 2020-07-15 DIAGNOSIS — Z01419 Encounter for gynecological examination (general) (routine) without abnormal findings: Secondary | ICD-10-CM | POA: Diagnosis not present

## 2020-07-15 DIAGNOSIS — N898 Other specified noninflammatory disorders of vagina: Secondary | ICD-10-CM

## 2020-07-15 DIAGNOSIS — R102 Pelvic and perineal pain: Secondary | ICD-10-CM

## 2020-07-15 DIAGNOSIS — Z3009 Encounter for other general counseling and advice on contraception: Secondary | ICD-10-CM

## 2020-07-15 DIAGNOSIS — Z30011 Encounter for initial prescription of contraceptive pills: Secondary | ICD-10-CM

## 2020-07-15 DIAGNOSIS — Z113 Encounter for screening for infections with a predominantly sexual mode of transmission: Secondary | ICD-10-CM

## 2020-07-15 DIAGNOSIS — B373 Candidiasis of vulva and vagina: Secondary | ICD-10-CM

## 2020-07-15 LAB — POCT URINE PREGNANCY: Preg Test, Ur: NEGATIVE

## 2020-07-15 MED ORDER — FLUCONAZOLE 150 MG PO TABS: 150.0000 mg | ORAL_TABLET | Freq: Once | ORAL | 0 refills | Status: AC

## 2020-07-15 MED ORDER — DOXYCYCLINE HYCLATE 100 MG PO CAPS: 100.0000 mg | ORAL_CAPSULE | Freq: Two times a day (BID) | ORAL | 0 refills | Status: DC

## 2020-07-15 MED ORDER — BALCOLTRA 0.1-20 MG-MCG(21) PO TABS: 1.0000 | ORAL_TABLET | Freq: Every day | ORAL | 11 refills | Status: DC

## 2020-07-15 MED ORDER — METRONIDAZOLE 500 MG PO TABS: 500.0000 mg | ORAL_TABLET | Freq: Two times a day (BID) | ORAL | 2 refills | Status: DC

## 2020-07-15 MED ORDER — IBUPROFEN 800 MG PO TABS: 800.0000 mg | ORAL_TABLET | Freq: Three times a day (TID) | ORAL | 5 refills | Status: DC | PRN

## 2020-07-15 NOTE — Progress Notes (Addendum)
Subjective:        Diamond Burton is a 35 y.o. female here for a routine exam.  Current complaints: Painful intercourse and vaginal discharge.  Personal health questionnaire:  Is patient Ashkenazi Jewish, have a family history of breast and/or ovarian cancer: no Is there a family history of uterine cancer diagnosed at age < 42, gastrointestinal cancer, urinary tract cancer, family member who is a Field seismologist syndrome-associated carrier: no Is the patient overweight and hypertensive, family history of diabetes, personal history of gestational diabetes, preeclampsia or PCOS: no Is patient over 38, have PCOS,  family history of premature CHD under age 61, diabetes, smoke, have hypertension or peripheral artery disease:  no At any time, has a partner hit, kicked or otherwise hurt or frightened you?: no Over the past 2 weeks, have you felt down, depressed or hopeless?: no Over the past 2 weeks, have you felt little interest or pleasure in doing things?:no   Gynecologic History No LMP recorded. Contraception: none Last Pap: 2019. Results were: normal Last mammogram: n/a. Results were: n/a  Obstetric History OB History  Gravida Para Term Preterm AB Living  _0 SAB IAB Ectopic Multiple Live Births  1     0 4    # Outcome Date GA Lbr Len/2nd Weight Sex Delivery Anes PTL Lv  5 Term 04/29/19 49w3d12:20 / 00:15 8 lb 0.4 oz (3.64 kg) M Vag-Spont EPI  LIV  4 SAB 07/2018          3 Term 11/29/16 313w6d9:26 / 00:25 5 lb 11 oz (2.58 kg) M Vag-Vacuum EPI  LIV  2 Term 06/28/11 4075w4d:32 / 00:21 7 lb 1.1 oz (3.205 kg) F Vag-Spont EPI  LIV     Birth Comments: WNL  1 Term 2005 40w83w0dlb 8 oz (2.948 kg) F Vag-Spont   LIV    Past Medical History:  Diagnosis Date  . Abnormal Pap smear   . Bipolar disorder ([facility])[place]. Chlamydia infection affecting pregnancy in first trimester 10/10/2018  . Depression    stopped meds, was wanting to see counselor  . Eczema   . Erb's palsy    L arm  .  Erb's palsy    left arm  . Gonorrhea affecting pregnancy in first trimester 10/10/2018  . Herpes    frequent outbreaks on valtrex 500  . History of chlamydia   . 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 History of gonorrhea   . Hydradenitis   . Infection    UTI  . Kidney stones   . Ovarian cyst   . Periodontal disease   . PID (pelvic inflammatory disease)   . PTSD (post-traumatic stress disorder)   . Trichomonas contact     Past Surgical History:  Procedure Laterality Date  . HYDRADENITIS EXCISION  03/2019  . IRRIGATION AND DEBRIDEMENT ABSCESS Left 03/20/2019   Procedure: IRRIGATION AND DEBRIDEMENT BREAST ABSCESS;  Surgeon: BlacCoralie Keens;  [facility];  Laterality: Left;  . NO PAST SURGERIES       Current Outpatient Medications:  .  clindamycin (CLEOCIN T) 1 % external solution, Apply topically 2 (two) times daily., Disp: , Rfl:  .  clindamycin (CLEOCIN) 300 MG capsule, Take 300 mg by mouth 2 (two) times daily., Disp: , Rfl:  .  fluconazole (DIFLUCAN) 150 MG tablet, Take 1 tablet (150 mg total) by mouth once for 1 dose.,  Disp: 1 tablet, Rfl: 0 .  ibuprofen (ADVIL) 800 MG tablet, Take 1 tablet (800 mg total) by mouth every 8 (eight) hours as needed., Disp: 30 tablet, Rfl: 5 .  Levonorgest-Eth Estrad-Fe Bisg (BALCOLTRA) 0.1-20 MG-MCG(21) TABS, Take 1 tablet by mouth daily., Disp: 28 tablet, Rfl: 11 .  ondansetron (ZOFRAN ODT) 4 MG disintegrating tablet, Take 1 tablet (4 mg total) by mouth every 8 (eight) hours as needed for up to 20 doses for nausea or vomiting., Disp: 20 tablet, Rfl: 0 .  valACYclovir (VALTREX) 1000 MG tablet, Take 1,000 mg by mouth daily., Disp: , Rfl:  .  Blood Pressure KIT, Monitor BP at home regularly extra large cuff O09.90 (Patient not taking: No sig reported), Disp: 1 kit, Rfl: 0 .  Drospirenone (SLYND) 4 MG TABS, Take 1 tablet by mouth daily., Disp: 84 tablet, Rfl: 4 .  Elastic Bandages & Supports (COMFORT FIT MATERNITY SUPP  SM) MISC, 1 Units by Does not apply route daily as needed., Disp: 1 each, Rfl: 0 .  ibuprofen (ADVIL) 800 MG tablet, Take 1 tablet (800 mg total) by mouth every 8 (eight) hours as needed., Disp: 30 tablet, Rfl: 0 .  Prenat-FeCbn-FeAsp-Meth-FA-DHA (PRENATE MINI) 18-0.6-0.4-350 MG CAPS, TAKE 1 CAPSULE BY MOUTH EVERY DAY BEFORE BREAKFAST (Patient not taking: Reported on 07/15/2020), Disp: 30 capsule, Rfl: 5 .  sertraline (ZOLOFT) 50 MG tablet, Take 1 tablet (50 mg total) by mouth daily., Disp: 30 tablet, Rfl: 1 .  valACYclovir (VALTREX) 500 MG tablet, TAKE 1 TABLET BY MOUTH TWICE A DAY, Disp: 60 tablet, Rfl: 1 Allergies  Allergen Reactions  . Latex     Social History   Tobacco Use  . Smoking status: Former Smoker    Packs/day: 0.50    Years: 2.00    Pack years: 1.00    Types: Cigarettes  . Smokeless tobacco: Never Used  Substance Use Topics  . Alcohol use: No    Comment: occ    Family History  Problem Relation Age of Onset  . Depression Mother   . Diabetes Mother   . Hypertension Mother   . Heart disease Mother   . Asthma Sister   . Asthma Daughter   . Hypertension Maternal Grandmother   . Diabetes Maternal Grandmother       Review of Systems  Constitutional: negative for fatigue and weight loss Respiratory: negative for cough and wheezing Cardiovascular: negative for chest pain, fatigue and palpitations Gastrointestinal: negative for abdominal pain and change in bowel habits Musculoskeletal:negative for myalgias Neurological: negative for gait problems and tremors Behavioral/Psych: negative for abusive relationship, depression Endocrine: negative for temperature intolerance    Genitourinary:negative for abnormal menstrual periods, genital lesions, hot flashes, sexual problems.  Positive for pelvic pain and vaginal discharge Integument/breast: negative for breast lump, breast tenderness, nipple discharge and skin lesion(s)    Objective:       BP 112/68   Pulse 65   Wt  206 lb (93.4 kg)   BMI 33.25 kg/m  General:   alert and no distress  Skin:   no rash or abnormalities  Lungs:   clear to auscultation bilaterally  Heart:   regular rate and rhythm, S1, S2 normal, no murmur, click, rub or gallop  Breasts:   normal without suspicious masses, skin or nipple changes or axillary nodes  Abdomen:  normal findings: no organomegaly, soft, non-tender and no hernia  Pelvis:  External genitalia: normal general appearance Urinary system: urethral meatus normal and bladder without fullness, nontender Vaginal:  normal without tenderness, induration or masses Cervix: normal appearance Adnexa: bilateral adnexal tenderness, no masses Uterus: anteverted and tender, normal size   Lab Review Urine pregnancy test Labs reviewed yes Radiologic studies reviewed no  50% of 20 min visit spent on counseling and coordination of care.   Assessment:     1. Encounter for gynecological examination with Papanicolaou smear of cervix Rx: - Cytology - PAP( [facility]) - POCT urine pregnancy  2. Vaginal discharge Rx: - Cervicovaginal ancillary only  3. Pelvic pain Rx: - US PELVIC COMPLETE WITH TRANSVAGINAL; Future - ibuprofen (ADVIL) 800 MG tablet; Take 1 tablet (800 mg total) by mouth every 8 (eight) hours as needed.  Dispense: 30 tablet; Refill: 5  4. PID (acute pelvic inflammatory disease) Rx: - doxycycline (VIBRAMYCIN) 100 MG capsule; Take 1 capsule (100 mg total) by mouth 2 (two) times daily.  Dispense: 28 capsule; Refill: 0 - metroNIDAZOLE (FLAGYL) 500 MG tablet; Take 1 tablet (500 mg total) by mouth 2 (two) times daily.  Dispense: 28 tablet; Refill: 2  5. Screening for STD (sexually transmitted disease) Rx: - Hepatitis B surface antigen - Hepatitis C antibody - RPR - HIV Antibody (routine testing w rflx)  6. Encounter for counseling regarding contraception - wants OCP's  7. Encounter for initial prescription of contraceptive pills Rx: - POCT urine  pregnancy - Levonorgest-Eth Estrad-Fe Bisg (BALCOLTRA) 0.1-20 MG-MCG(21) TABS; Take 1 tablet by mouth daily.  Dispense: 28 tablet; Refill: 11  8. Candida vaginitis Rx: - fluconazole (DIFLUCAN) 150 MG tablet; Take 1 tablet (150 mg total) by mouth once for 1 dose.  Dispense: 1 tablet; Refill: 0    Plan:    Education reviewed: calcium supplements, depression evaluation, low fat, low cholesterol diet, safe sex/STD prevention, self breast exams and weight bearing exercise. Contraception: OCP (estrogen/progesterone). Follow up in: 2 weeks.   Meds ordered this encounter  Medications  . ibuprofen (ADVIL) 800 MG tablet    Sig: Take 1 tablet (800 mg total) by mouth every 8 (eight) hours as needed.    Dispense:  30 tablet    Refill:  5  . Levonorgest-Eth Estrad-Fe Bisg (BALCOLTRA) 0.1-20 MG-MCG(21) TABS    Sig: Take 1 tablet by mouth daily.    Dispense:  28 tablet    Refill:  11  . fluconazole (DIFLUCAN) 150 MG tablet    Sig: Take 1 tablet (150 mg total) by mouth once for 1 dose.    Dispense:  1 tablet    Refill:  0   Orders Placed This Encounter  Procedures  . US PELVIC COMPLETE WITH TRANSVAGINAL    Standing Status:   Future    Standing Expiration Date:   07/15/2021    Order Specific Question:   Reason for Exam (SYMPTOM  OR DIAGNOSIS REQUIRED)    Answer:   Pelvic pain    Order Specific Question:   Preferred imaging location?    Answer:   [facility]  . Hepatitis B surface antigen  . Hepatitis C antibody  . RPR  . HIV Antibody (routine testing w rflx)  . POCT urine pregnancy    Shelly Bombard, MD 07/15/2020 11:28 AM

## 2020-07-15 NOTE — Progress Notes (Signed)
Pt c/o dyspareunia and requests all STD testing.  Normal pap 12/19/2017 UPT neg  Pt requests to start BCP

## 2020-07-16 ENCOUNTER — Other Ambulatory Visit: Payer: Self-pay | Admitting: Obstetrics

## 2020-07-16 LAB — CERVICOVAGINAL ANCILLARY ONLY
Bacterial Vaginitis (gardnerella): NEGATIVE
Candida Glabrata: NEGATIVE
Candida Vaginitis: NEGATIVE
Chlamydia: NEGATIVE
Comment: NEGATIVE
Comment: NEGATIVE
Comment: NEGATIVE
Comment: NEGATIVE
Comment: NEGATIVE
Comment: NORMAL
Neisseria Gonorrhea: NEGATIVE
Trichomonas: POSITIVE — AB

## 2020-07-16 LAB — HIV ANTIBODY (ROUTINE TESTING W REFLEX): HIV Screen 4th Generation wRfx: NONREACTIVE

## 2020-07-16 LAB — HEPATITIS C ANTIBODY: Hep C Virus Ab: <0.1 s/co ratio (ref 0.0–0.9)

## 2020-07-16 LAB — HEPATITIS B SURFACE ANTIGEN: Hepatitis B Surface Ag: NEGATIVE

## 2020-07-16 LAB — RPR: RPR Ser Ql: NONREACTIVE

## 2020-07-20 ENCOUNTER — Other Ambulatory Visit: Payer: Self-pay | Admitting: Obstetrics

## 2020-07-20 LAB — CYTOLOGY - PAP
Comment: NEGATIVE
Diagnosis: NEGATIVE
High risk HPV: NEGATIVE

## 2020-08-03 ENCOUNTER — Ambulatory Visit: Payer: Medicaid Other | Admitting: Obstetrics

## 2020-08-07 ENCOUNTER — Ambulatory Visit
Admission: RE | Admit: 2020-08-07 | Discharge: 2020-08-07 | Disposition: A | Discharge status: Home / Self Care | Payer: Medicaid Other | Source: Ambulatory Visit | Attending: Obstetrics | Admitting: Obstetrics

## 2020-08-07 ENCOUNTER — Other Ambulatory Visit: Payer: Self-pay

## 2020-08-07 DIAGNOSIS — R102 Pelvic and perineal pain: Secondary | ICD-10-CM | POA: Diagnosis present

## 2020-08-10 ENCOUNTER — Encounter: Payer: Self-pay | Admitting: Obstetrics

## 2020-08-10 ENCOUNTER — Other Ambulatory Visit (HOSPITAL_COMMUNITY)
Admission: RE | Admit: 2020-08-10 | Discharge: 2020-08-10 | Disposition: A | Discharge status: Home / Self Care | Payer: Medicaid Other | Source: Ambulatory Visit | Attending: Obstetrics | Admitting: Obstetrics

## 2020-08-10 ENCOUNTER — Other Ambulatory Visit: Payer: Self-pay

## 2020-08-10 ENCOUNTER — Ambulatory Visit (INDEPENDENT_AMBULATORY_CARE_PROVIDER_SITE_OTHER): Payer: Medicaid Other | Admitting: Obstetrics

## 2020-08-10 VITALS — BP 124/82 | HR 60 | Wt 204.6 lb

## 2020-08-10 DIAGNOSIS — N898 Other specified noninflammatory disorders of vagina: Secondary | ICD-10-CM | POA: Diagnosis not present

## 2020-08-10 DIAGNOSIS — N730 Acute parametritis and pelvic cellulitis: Secondary | ICD-10-CM | POA: Diagnosis not present

## 2020-08-10 DIAGNOSIS — Z113 Encounter for screening for infections with a predominantly sexual mode of transmission: Secondary | ICD-10-CM | POA: Diagnosis not present

## 2020-08-10 NOTE — Progress Notes (Signed)
Pt presents for follow up. Self Swab completed and sent to lab for [facility]

## 2020-08-10 NOTE — Progress Notes (Signed)
Patient ID: Diamond Burton, female   DOB: [birth year], 35 y.o.   MRN: [id number]  Chief Complaint  Patient presents with  . Follow-up    HPI Diamond Burton is a 35 y.o. female.  History of PID.  Treated with Doxy / Flagyl x 2 weeks.  Presents today for follow up.  No complaints.  Denies pelvic pain but does have vaginal discharge. HPI  Past Medical History:  Diagnosis Date  . Abnormal Pap smear   . Bipolar disorder ([place])   . Chlamydia infection affecting pregnancy in first trimester 10/10/2018  . Depression    stopped meds, was wanting to see counselor  . Eczema   . Erb's palsy    L arm  . Erb's palsy    left arm  . Gonorrhea affecting pregnancy in first trimester 10/10/2018  . Herpes    frequent outbreaks on valtrex 500  . History of chlamydia   . 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 History of gonorrhea   . Hydradenitis   . Infection    UTI  . Kidney stones   . Ovarian cyst   . Periodontal disease   . PID (pelvic inflammatory disease)   . PTSD (post-traumatic stress disorder)   . Trichomonas contact     Past Surgical History:  Procedure Laterality Date  . HYDRADENITIS EXCISION  03/2019  . IRRIGATION AND DEBRIDEMENT ABSCESS Left 03/20/2019   Procedure: IRRIGATION AND DEBRIDEMENT BREAST ABSCESS;  Surgeon: Coralie Keens, MD;  Location: [facility];  Service: General;  Laterality: Left;  . NO PAST SURGERIES      Family History  Problem Relation Age of Onset  . Depression Mother   . Diabetes Mother   . Hypertension Mother   . Heart disease Mother   . Asthma Sister   . Asthma Daughter   . Hypertension Maternal Grandmother   . Diabetes Maternal Grandmother     Social History Social History   Tobacco Use  . Smoking status: Former Smoker    Packs/day: 0.50    Years: 2.00    Pack years: 1.00    Types: Cigarettes  . Smokeless tobacco: Never Used  Vaping Use  . Vaping Use: Never used  Substance Use Topics  . Alcohol use: No    Comment: occ  . Drug  use: No    Allergies  Allergen Reactions  . Latex     Current Outpatient Medications  Medication Sig Dispense Refill  . Blood Pressure KIT Monitor BP at home regularly extra large cuff O09.90 (Patient not taking: No sig reported) 1 kit 0  . clindamycin (CLEOCIN T) 1 % external solution Apply topically 2 (two) times daily.    . clindamycin (CLEOCIN) 300 MG capsule Take 300 mg by mouth 2 (two) times daily.    [name] doxycycline (VIBRAMYCIN) 100 MG capsule Take 1 capsule (100 mg total) by mouth 2 (two) times daily. 28 capsule 0  . Drospirenone (SLYND) 4 MG TABS Take 1 tablet by mouth daily. 84 tablet 4  . Elastic Bandages & Supports (COMFORT FIT MATERNITY SUPP SM) MISC 1 Units by Does not apply route daily as needed. 1 each 0  . ibuprofen (ADVIL) 800 MG tablet Take 1 tablet (800 mg total) by mouth every 8 (eight) hours as needed. 30 tablet 0  . ibuprofen (ADVIL) 800 MG tablet Take 1 tablet (800 mg total) by mouth every 8 (eight) hours as needed. 30 tablet 5  . Levonorgest-Eth Estrad-Fe Bisg (BALCOLTRA) 0.1-20 MG-MCG(21) TABS  Take 1 tablet by mouth daily. 28 tablet 11  . metroNIDAZOLE (FLAGYL) 500 MG tablet Take 1 tablet (500 mg total) by mouth 2 (two) times daily. 28 tablet 2  . ondansetron (ZOFRAN ODT) 4 MG disintegrating tablet Take 1 tablet (4 mg total) by mouth every 8 (eight) hours as needed for up to 20 doses for nausea or vomiting. 20 tablet 0  . Prenat-FeCbn-FeAsp-Meth-FA-DHA (PRENATE MINI) 18-0.6-0.4-350 MG CAPS TAKE 1 CAPSULE BY MOUTH EVERY DAY BEFORE BREAKFAST (Patient not taking: Reported on 07/15/2020) 30 capsule 5  . sertraline (ZOLOFT) 50 MG tablet Take 1 tablet (50 mg total) by mouth daily. 30 tablet 1  . valACYclovir (VALTREX) 1000 MG tablet Take 1,000 mg by mouth daily.    . valACYclovir (VALTREX) 500 MG tablet TAKE 1 TABLET BY MOUTH TWICE A DAY 60 tablet 1   No current facility-administered medications for this visit.    Review of Systems Review of Systems Constitutional:  negative for fatigue and weight loss Respiratory: negative for cough and wheezing Cardiovascular: negative for chest pain, fatigue and palpitations Gastrointestinal: negative for abdominal pain and change in bowel habits Genitourinary: positive for vaginal discharge Integument/breast: negative for nipple discharge Musculoskeletal:negative for myalgias Neurological: negative for gait problems and tremors Behavioral/Psych: negative for abusive relationship, depression Endocrine: negative for temperature intolerance      Blood pressure 124/82, pulse 60, weight 204 lb 9.6 oz (92.8 kg), unknown if currently breastfeeding.  Physical Exam  Physical Exam           General:  Alert and no distress Abdomen:  normal findings: no organomegaly, soft, non-tender and no hernia  Pelvis:  External genitalia: normal general appearance Urinary system: urethral meatus normal and bladder without fullness, nontender Vaginal: normal without tenderness, induration or masses Cervix: normal appearance Adnexa: normal bimanual exam Uterus: anteverted and non-tender, normal size    50% of 15 min visit spent on counseling and coordination of care.   Data Reviewed Wet Prep and Cultures Results for COLINE, CALKIN (MRN [id number]) as of 08/10/2020 10:44  Ref. Range 07/15/2020 11:22  Chlamydia Unknown Negative  Neisseria Gonorrhea Unknown Negative  Trichomonas Unknown Positive (A)  Candida Vaginitis Unknown Negative  Candida Glabrata Unknown Negative  Bacterial Vaginitis (gardnerella) Unknown Negative  High risk HPV Unknown Negative    Ultrasound: US PELVIC COMPLETE WITH TRANSVAGINAL (Accession [id number]) (Order [id number]) Imaging Date: 08/07/2020 Department: [facility] Released By: Kyra Leyland I Authorizing: Shelly Bombard, MD    Exam Status  Status  Final [99]   PACS Intelerad Image Link  Show images for US PELVIC COMPLETE WITH TRANSVAGINAL  Study  Result  Narrative & Impression  CLINICAL DATA:  Pelvic pain, LEFT pelvic pain for 2 months, dyspareunia, LMP 07/14/2020  EXAM: TRANSABDOMINAL AND TRANSVAGINAL ULTRASOUND OF PELVIS  TECHNIQUE: Both transabdominal and transvaginal ultrasound examinations of the pelvis were performed. Transabdominal technique was performed for global imaging of the pelvis including uterus, ovaries, adnexal regions, and pelvic cul-de-sac. It was necessary to proceed with endovaginal exam following the transabdominal exam to visualize the uterus, endometrium, and ovaries.  COMPARISON:  12/26/2017  FINDINGS: Uterus  Measurements: 8.4 x 3.8 x 5.6 cm = volume: 94 mL. Anteverted. Slightly heterogeneous myometrium. No focal mass.  Endometrium  Thickness: 4 mm.  No endometrial fluid or focal abnormality  Right ovary  Measurements: 4.0 x 2.0 x 2.3 cm = volume: 9.6 mL. Small resolving corpus luteum; no follow-up imaging recommended. Otherwise unremarkable.  Left ovary  Measurements: 2.5 x  1.9 x 1.6 cm = volume: 6.3 mL. Normal morphology without mass  Other findings  Trace free pelvic fluid at cul-de-sac.  No adnexal masses.  IMPRESSION: No pelvic sonographic abnormalities identified.   Electronically Signed   By: Lavonia Dana M.D.   On: 08/07/2020 14:30     Assessment     1. PID (acute pelvic inflammatory disease), resolved - condoms recommended to prevent STD's  2. Vaginal discharge Rx: - Cervicovaginal ancillary only    Plan   Follow up prn   Shelly Bombard, MD 08/10/2020 10:49 AM

## 2020-08-11 LAB — CERVICOVAGINAL ANCILLARY ONLY
Chlamydia: NEGATIVE
Comment: NEGATIVE
Comment: NEGATIVE
Comment: NORMAL
Neisseria Gonorrhea: NEGATIVE
Trichomonas: NEGATIVE

## 2020-09-09 ENCOUNTER — Ambulatory Visit (INDEPENDENT_AMBULATORY_CARE_PROVIDER_SITE_OTHER): Payer: Medicaid Other | Admitting: Obstetrics

## 2020-09-09 ENCOUNTER — Encounter: Payer: Self-pay | Admitting: Obstetrics

## 2020-09-09 ENCOUNTER — Other Ambulatory Visit: Payer: Self-pay

## 2020-09-09 ENCOUNTER — Other Ambulatory Visit (HOSPITAL_COMMUNITY)
Admission: RE | Admit: 2020-09-09 | Discharge: 2020-09-09 | Disposition: A | Discharge status: Home / Self Care | Payer: Medicaid Other | Source: Ambulatory Visit | Attending: Obstetrics | Admitting: Obstetrics

## 2020-09-09 VITALS — BP 129/75 | HR 84 | Wt 209.0 lb

## 2020-09-09 DIAGNOSIS — N930 Postcoital and contact bleeding: Secondary | ICD-10-CM

## 2020-09-09 DIAGNOSIS — N898 Other specified noninflammatory disorders of vagina: Secondary | ICD-10-CM | POA: Insufficient documentation

## 2020-09-09 NOTE — Progress Notes (Signed)
Patient ID: Diamond Burton, female   DOB: Apr 08, 1986, 35 y.o.   MRN: [id number]   HPI Tramaine Burton is a 35 y.o. female.  Complains of vaginal bleeding after intercourse.  Also c/o vaginal discharge and itching. HPI  Past Medical History:  Diagnosis Date  . Abnormal Pap smear   . Bipolar disorder ([place])   . Chlamydia infection affecting pregnancy in first trimester 10/10/2018  . Depression    stopped meds, was wanting to see counselor  . Eczema   . Erb's palsy    L arm  . Erb's palsy    left arm  . Gonorrhea affecting pregnancy in first trimester 10/10/2018  . Herpes    frequent outbreaks on valtrex 500  . History of chlamydia   . 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 History of gonorrhea   . Hydradenitis   . Infection    UTI  . Kidney stones   . Ovarian cyst   . Periodontal disease   . PID (pelvic inflammatory disease)   . PTSD (post-traumatic stress disorder)   . Trichomonas contact     Past Surgical History:  Procedure Laterality Date  . HYDRADENITIS EXCISION  03/2019  . IRRIGATION AND DEBRIDEMENT ABSCESS Left 03/20/2019   Procedure: IRRIGATION AND DEBRIDEMENT BREAST ABSCESS;  Surgeon: Coralie Keens, MD;  Location: [facility];  Service: General;  Laterality: Left;  . NO PAST SURGERIES      Family History  Problem Relation Age of Onset  . Depression Mother   . Diabetes Mother   . Hypertension Mother   . Heart disease Mother   . Asthma Sister   . Asthma Daughter   . Hypertension Maternal Grandmother   . Diabetes Maternal Grandmother     Social History Social History   Tobacco Use  . Smoking status: Former Smoker    Packs/day: 0.50    Years: 2.00    Pack years: 1.00    Types: Cigarettes  . Smokeless tobacco: Never Used  Vaping Use  . Vaping Use: Never used  Substance Use Topics  . Alcohol use: No    Comment: occ  . Drug use: No    Allergies  Allergen Reactions  . Latex     Current Outpatient Medications  Medication Sig Dispense Refill   . ibuprofen (ADVIL) 800 MG tablet Take 1 tablet (800 mg total) by mouth every 8 (eight) hours as needed. 30 tablet 5  . valACYclovir (VALTREX) 500 MG tablet TAKE 1 TABLET BY MOUTH TWICE A DAY 60 tablet 1  . Blood Pressure KIT Monitor BP at home regularly extra large cuff O09.90 (Patient not taking: No sig reported) 1 kit 0  . clindamycin (CLEOCIN T) 1 % external solution Apply topically 2 (two) times daily.    . clindamycin (CLEOCIN) 300 MG capsule Take 300 mg by mouth 2 (two) times daily.    [name] doxycycline (VIBRAMYCIN) 100 MG capsule Take 1 capsule (100 mg total) by mouth 2 (two) times daily. 28 capsule 0  . Drospirenone (SLYND) 4 MG TABS Take 1 tablet by mouth daily. (Patient not taking: Reported on 09/09/2020) 84 tablet 4  . Elastic Bandages & Supports (COMFORT FIT MATERNITY SUPP SM) MISC 1 Units by Does not apply route daily as needed. 1 each 0  . ibuprofen (ADVIL) 800 MG tablet Take 1 tablet (800 mg total) by mouth every 8 (eight) hours as needed. 30 tablet 0  . Levonorgest-Eth Estrad-Fe Bisg (BALCOLTRA) 0.1-20 MG-MCG(21) TABS Take 1 tablet by  mouth daily. (Patient not taking: Reported on 09/09/2020) 28 tablet 11  . metroNIDAZOLE (FLAGYL) 500 MG tablet Take 1 tablet (500 mg total) by mouth 2 (two) times daily. (Patient not taking: Reported on 09/09/2020) 28 tablet 2  . Prenat-FeCbn-FeAsp-Meth-FA-DHA (PRENATE MINI) 18-0.6-0.4-350 MG CAPS TAKE 1 CAPSULE BY MOUTH EVERY DAY BEFORE BREAKFAST (Patient not taking: Reported on 07/15/2020) 30 capsule 5  . valACYclovir (VALTREX) 1000 MG tablet Take 1,000 mg by mouth daily.     No current facility-administered medications for this visit.    Review of Systems Review of Systems Constitutional: negative for fatigue and weight loss Respiratory: negative for cough and wheezing Cardiovascular: negative for chest pain, fatigue and palpitations Gastrointestinal: negative for abdominal pain and change in bowel habits Genitourinary: positive for vaginal discharge  and bleeding with sex Integument/breast: negative for nipple discharge Musculoskeletal:negative for myalgias Neurological: negative for gait problems and tremors Behavioral/Psych: negative for abusive relationship, depression Endocrine: negative for temperature intolerance      Blood pressure 129/75, pulse 84, weight 209 lb (94.8 kg), last menstrual period 08/14/2020, unknown if currently breastfeeding.  Physical Exam Physical Exam General:   alert and no distress  Skin:   no rash or abnormalities  Lungs:   clear to auscultation bilaterally  Heart:   regular rate and rhythm, S1, S2 normal, no murmur, click, rub or gallop  Breasts:    Not examined  Abdomen:  normal findings: no organomegaly, soft, non-tender and no hernia  Pelvis:  External genitalia: normal general appearance Urinary system: urethral meatus normal and bladder without fullness, nontender Vaginal: normal without tenderness, induration or masses Cervix: normal appearance Adnexa: normal bimanual exam Uterus: anteverted and non-tender, normal size    I have spent a total of 15 minutes of face-to-face time, excluding clinical staff time, reviewing notes and preparing to see patient, ordering tests and/or medications, and counseling the patient.  Data Reviewed Wet Prep  Assessment     1. Vaginal discharge Rx: - Cervicovaginal ancillary only( [place])  2. Postcoital and contact bleeding     Plan   Follow up prn  Shelly Bombard, MD 09/09/2020 10:26 AM

## 2020-09-09 NOTE — Progress Notes (Signed)
GYN presents for spotting after sex on Monday, cramping 4/10, itching.

## 2020-09-10 LAB — CERVICOVAGINAL ANCILLARY ONLY
Bacterial Vaginitis (gardnerella): NEGATIVE
Candida Glabrata: NEGATIVE
Candida Vaginitis: NEGATIVE
Chlamydia: NEGATIVE
Comment: NEGATIVE
Comment: NEGATIVE
Comment: NEGATIVE
Comment: NEGATIVE
Comment: NEGATIVE
Comment: NORMAL
Neisseria Gonorrhea: NEGATIVE
Trichomonas: POSITIVE — AB

## 2020-09-11 ENCOUNTER — Other Ambulatory Visit: Payer: Self-pay | Admitting: Obstetrics

## 2020-09-11 DIAGNOSIS — N730 Acute parametritis and pelvic cellulitis: Secondary | ICD-10-CM

## 2020-09-11 DIAGNOSIS — A599 Trichomoniasis, unspecified: Secondary | ICD-10-CM

## 2020-09-11 MED ORDER — TINIDAZOLE 500 MG PO TABS: 2.0000 g | ORAL_TABLET | Freq: Once | ORAL | 1 refills | Status: AC

## 2020-11-02 ENCOUNTER — Other Ambulatory Visit (HOSPITAL_COMMUNITY)
Admission: RE | Admit: 2020-11-02 | Discharge: 2020-11-02 | Disposition: A | Discharge status: Home / Self Care | Payer: Medicaid Other | Source: Ambulatory Visit | Attending: Obstetrics | Admitting: Obstetrics

## 2020-11-02 ENCOUNTER — Encounter: Payer: Self-pay | Admitting: Obstetrics

## 2020-11-02 ENCOUNTER — Ambulatory Visit (INDEPENDENT_AMBULATORY_CARE_PROVIDER_SITE_OTHER): Payer: Medicaid Other | Admitting: Obstetrics

## 2020-11-02 ENCOUNTER — Other Ambulatory Visit: Payer: Self-pay

## 2020-11-02 VITALS — BP 119/76 | HR 80 | Wt 206.0 lb

## 2020-11-02 DIAGNOSIS — F409 Phobic anxiety disorder, unspecified: Secondary | ICD-10-CM

## 2020-11-02 DIAGNOSIS — Z113 Encounter for screening for infections with a predominantly sexual mode of transmission: Secondary | ICD-10-CM | POA: Diagnosis not present

## 2020-11-02 DIAGNOSIS — N898 Other specified noninflammatory disorders of vagina: Secondary | ICD-10-CM

## 2020-11-02 DIAGNOSIS — F930 Separation anxiety disorder of childhood: Secondary | ICD-10-CM | POA: Diagnosis not present

## 2020-11-02 DIAGNOSIS — F5105 Insomnia due to other mental disorder: Secondary | ICD-10-CM

## 2020-11-02 MED ORDER — ALPRAZOLAM 0.25 MG PO TABS: 0.2500 mg | ORAL_TABLET | Freq: Two times a day (BID) | ORAL | 1 refills | Status: DC

## 2020-11-02 MED ORDER — ALPRAZOLAM 0.25 MG PO TABS: 0.2500 mg | ORAL_TABLET | Freq: Two times a day (BID) | ORAL | 2 refills | Status: DC

## 2020-11-02 NOTE — Progress Notes (Signed)
Patient ID: Diamond Burton, female   DOB: 01-23-1986, 35 y.o.   MRN: [id number]  Chief Complaint  Patient presents with  . STD screening     HPI Diamond Burton is a 35 y.o. female.  Complains of vaginal discharge.  Also is severely anxious because daughter has been missing for the past month, and is feared to have run away with boyfriend. HPI  Past Medical History:  Diagnosis Date  . Abnormal Pap smear   . Bipolar disorder ([place])   . Chlamydia infection affecting pregnancy in first trimester 10/10/2018  . Depression    stopped meds, was wanting to see counselor  . Eczema   . Erb's palsy    L arm  . Erb's palsy    left arm  . Gonorrhea affecting pregnancy in first trimester 10/10/2018  . Herpes    frequent outbreaks on valtrex 500  . History of chlamydia   . 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 History of gonorrhea   . Hydradenitis   . Infection    UTI  . Kidney stones   . Ovarian cyst   . Periodontal disease   . PID (pelvic inflammatory disease)   . PTSD (post-traumatic stress disorder)   . Trichomonas contact     Past Surgical History:  Procedure Laterality Date  . HYDRADENITIS EXCISION  03/2019  . IRRIGATION AND DEBRIDEMENT ABSCESS Left 03/20/2019   Procedure: IRRIGATION AND DEBRIDEMENT BREAST ABSCESS;  Surgeon: Coralie Keens, MD;  Location: [facility];  Service: General;  Laterality: Left;  . NO PAST SURGERIES      Family History  Problem Relation Age of Onset  . Depression Mother   . Diabetes Mother   . Hypertension Mother   . Heart disease Mother   . Asthma Sister   . Asthma Daughter   . Hypertension Maternal Grandmother   . Diabetes Maternal Grandmother     Social History Social History   Tobacco Use  . Smoking status: Former Smoker    Packs/day: 0.50    Years: 2.00    Pack years: 1.00    Types: Cigarettes  . Smokeless tobacco: Never Used  Vaping Use  . Vaping Use: Never used  Substance Use Topics  . Alcohol use: No    Comment: occ   . Drug use: No    Allergies  Allergen Reactions  . Latex     Current Outpatient Medications  Medication Sig Dispense Refill  . clindamycin (CLEOCIN T) 1 % external solution Apply topically 2 (two) times daily.    . clindamycin (CLEOCIN) 300 MG capsule Take 300 mg by mouth 2 (two) times daily.    [name] doxycycline (VIBRAMYCIN) 100 MG capsule Take 1 capsule (100 mg total) by mouth 2 (two) times daily. 28 capsule 0  . PARoxetine (PAXIL) 20 MG tablet Take 40 mg by mouth daily.    . rifampin (RIFADIN) 150 MG capsule Take 150 mg by mouth 2 (two) times daily.    . risperiDONE (RISPERDAL) 1 MG tablet Take 1 mg by mouth at bedtime.    . valACYclovir (VALTREX) 500 MG tablet TAKE 1 TABLET BY MOUTH TWICE A DAY 60 tablet 1  . ALPRAZolam (XANAX) 0.25 MG tablet Take 1 tablet (0.25 mg total) by mouth 2 (two) times daily. 60 tablet 1  . Blood Pressure KIT Monitor BP at home regularly extra large cuff O09.90 (Patient not taking: No sig reported) 1 kit 0  . Drospirenone (SLYND) 4 MG TABS Take 1 tablet  by mouth daily. (Patient not taking: No sig reported) 84 tablet 4  . Elastic Bandages & Supports (COMFORT FIT MATERNITY SUPP SM) MISC 1 Units by Does not apply route daily as needed. (Patient not taking: Reported on 11/02/2020) 1 each 0  . ibuprofen (ADVIL) 800 MG tablet Take 1 tablet (800 mg total) by mouth every 8 (eight) hours as needed. (Patient not taking: Reported on 11/02/2020) 30 tablet 0  . ibuprofen (ADVIL) 800 MG tablet Take 1 tablet (800 mg total) by mouth every 8 (eight) hours as needed. (Patient not taking: Reported on 11/02/2020) 30 tablet 5  . Levonorgest-Eth Estrad-Fe Bisg (BALCOLTRA) 0.1-20 MG-MCG(21) TABS Take 1 tablet by mouth daily. (Patient not taking: No sig reported) 28 tablet 11  . metroNIDAZOLE (FLAGYL) 500 MG tablet Take 1 tablet (500 mg total) by mouth 2 (two) times daily. (Patient not taking: No sig reported) 28 tablet 2  . Prenat-FeCbn-FeAsp-Meth-FA-DHA (PRENATE MINI) 18-0.6-0.4-350 MG  CAPS TAKE 1 CAPSULE BY MOUTH EVERY DAY BEFORE BREAKFAST (Patient not taking: No sig reported) 30 capsule 5  . valACYclovir (VALTREX) 1000 MG tablet Take 1,000 mg by mouth daily. (Patient not taking: Reported on 11/02/2020)     No current facility-administered medications for this visit.    Review of Systems Review of Systems Constitutional: negative for fatigue and weight loss Respiratory: negative for cough and wheezing Cardiovascular: negative for chest pain, fatigue and palpitations Gastrointestinal: negative for abdominal pain and change in bowel habits Genitourinary: positive for vaginal discharge Integument/breast: negative for nipple discharge Musculoskeletal:negative for myalgias Neurological: negative for gait problems and tremors Behavioral/Psych: positive for severe anxiety Endocrine: negative for temperature intolerance      Blood pressure 119/76, pulse 80, weight 206 lb (93.4 kg), last menstrual period 10/14/2020, unknown if currently breastfeeding.  Physical Exam Physical Exam General:   alert and no distress  Skin:   no rash or abnormalities  Lungs:   clear to auscultation bilaterally  Heart:   regular rate and rhythm, S1, S2 normal, no murmur, click, rub or gallop  Breasts:    Not examined  Abdomen:  normal findings: no organomegaly, soft, non-tender and no hernia  Pelvis:  External genitalia: normal general appearance Urinary system: urethral meatus normal and bladder without fullness, nontender Vaginal: normal without tenderness, induration or masses Cervix: normal appearance Adnexa: normal bimanual exam Uterus: anteverted and non-tender, normal size    I have spent a total of 20 minutes of face-to-face and time, excluding clinical staff time, reviewing notes and preparing to see patient, ordering tests and/or medications, and counseling the patient.  Data Reviewed Labs  Assessment       1. Vaginal discharge Rx: - Cervicovaginal ancillary only  2.  Screening for STD (sexually transmitted disease) Rx: - Hepatitis B surface antigen - Hepatitis C antibody - HIV Antibody (routine testing w rflx) - RPR  3. Separation anxiety Rx: - Ambulatory referral to [place] [facility] - ALPRAZolam (XANAX) 0.25 MG tablet; Take 1 tablet (0.25 mg total) by mouth 2 (two) times daily.  Dispense: 60 tablet; Refill: 1  4. Insomnia due to anxiety and fear Rx: - ALPRAZolam (XANAX) 0.25 MG tablet; Take 1 tablet (0.25 mg total) by mouth 2 (two) times daily.  Dispense: 60 tablet; Refill: 1   Plan  Follow up in 2 weeks   Orders Placed This Encounter  Procedures  . Hepatitis B surface antigen  . Hepatitis C antibody  . HIV Antibody (routine testing w rflx)  . RPR  . Ambulatory  referral to [place]    Referral Priority:   Routine    Referral Type:   Consultation    Referral Reason:   Specialty Services Required    Number of Visits Requested:   1     Shelly Bombard, MD 11/02/2020 11:53 AM

## 2020-11-02 NOTE — Progress Notes (Signed)
Pt presents for all STD testing.  +Trich on 09/09/20 Normal pap smear 07/15/2020

## 2020-11-03 LAB — HEPATITIS C ANTIBODY: Hep C Virus Ab: <0.1 s/co ratio (ref 0.0–0.9)

## 2020-11-03 LAB — HEPATITIS B SURFACE ANTIGEN: Hepatitis B Surface Ag: NEGATIVE

## 2020-11-03 LAB — RPR: RPR Ser Ql: NONREACTIVE

## 2020-11-03 LAB — HIV ANTIBODY (ROUTINE TESTING W REFLEX): HIV Screen 4th Generation wRfx: NONREACTIVE

## 2020-11-04 LAB — CERVICOVAGINAL ANCILLARY ONLY
Bacterial Vaginitis (gardnerella): NEGATIVE
Candida Glabrata: NEGATIVE
Candida Vaginitis: NEGATIVE
Chlamydia: POSITIVE — AB
Comment: NEGATIVE
Comment: NEGATIVE
Comment: NEGATIVE
Comment: NEGATIVE
Comment: NEGATIVE
Comment: NORMAL
Neisseria Gonorrhea: NEGATIVE
Trichomonas: POSITIVE — AB

## 2020-11-05 ENCOUNTER — Encounter: Payer: Self-pay | Admitting: *Deleted

## 2020-11-05 ENCOUNTER — Other Ambulatory Visit: Payer: Self-pay | Admitting: Obstetrics

## 2020-11-05 DIAGNOSIS — N730 Acute parametritis and pelvic cellulitis: Secondary | ICD-10-CM

## 2020-11-05 MED ORDER — DOXYCYCLINE HYCLATE 100 MG PO CAPS: 100.0000 mg | ORAL_CAPSULE | Freq: Two times a day (BID) | ORAL | 0 refills | Status: DC

## 2020-11-05 MED ORDER — METRONIDAZOLE 500 MG PO TABS: 500.0000 mg | ORAL_TABLET | Freq: Two times a day (BID) | ORAL | 2 refills | Status: DC

## 2020-11-10 ENCOUNTER — Encounter: Payer: Medicaid Other | Admitting: Licensed Clinical Social Worker

## 2020-11-23 ENCOUNTER — Ambulatory Visit (INDEPENDENT_AMBULATORY_CARE_PROVIDER_SITE_OTHER): Payer: Medicaid Other | Admitting: *Deleted

## 2020-11-23 ENCOUNTER — Other Ambulatory Visit: Payer: Self-pay

## 2020-11-23 ENCOUNTER — Other Ambulatory Visit (HOSPITAL_COMMUNITY)
Admission: RE | Admit: 2020-11-23 | Discharge: 2020-11-23 | Disposition: A | Discharge status: Home / Self Care | Payer: Medicaid Other | Source: Ambulatory Visit | Attending: Obstetrics and Gynecology | Admitting: Obstetrics and Gynecology

## 2020-11-23 DIAGNOSIS — N760 Acute vaginitis: Secondary | ICD-10-CM | POA: Insufficient documentation

## 2020-11-23 MED ORDER — FLUCONAZOLE 150 MG PO TABS: 150.0000 mg | ORAL_TABLET | Freq: Once | ORAL | 0 refills | Status: AC

## 2020-11-23 NOTE — Progress Notes (Signed)
Pt is in office requesting Self Swab today. Pt was recently treated for CH/Trich and states she is still having irritation, d/c and some back pain.  Pt denies any urinary symptoms at this time.  Pt denies any recent intercourse.  Pt states she takes Doxycycline daily for dermatology use.  States she wonders if taking daily may inhibit treatment.   Self swab was preformed and will await results for any STD concerns. Diflucan was sent today for possible yeast.   Pt made aware if symptoms remain after swab and any new treatment, she will need an appt with a provider.

## 2020-11-24 ENCOUNTER — Ambulatory Visit: Payer: Medicaid Other

## 2020-11-24 LAB — CERVICOVAGINAL ANCILLARY ONLY
Bacterial Vaginitis (gardnerella): NEGATIVE
Candida Glabrata: NEGATIVE
Candida Vaginitis: POSITIVE — AB
Chlamydia: NEGATIVE
Comment: NEGATIVE
Comment: NEGATIVE
Comment: NEGATIVE
Comment: NEGATIVE
Comment: NEGATIVE
Comment: NORMAL
Neisseria Gonorrhea: NEGATIVE
Trichomonas: NEGATIVE

## 2020-11-24 NOTE — Progress Notes (Signed)
I reviewed the nurses note and agree with the plan of care.   Allia Wiltsey A, MD 09/08/2017 10:46 AM  

## 2020-11-25 ENCOUNTER — Other Ambulatory Visit: Payer: Self-pay | Admitting: Obstetrics

## 2020-11-25 ENCOUNTER — Telehealth: Payer: Self-pay

## 2020-11-25 DIAGNOSIS — B3731 Acute candidiasis of vulva and vagina: Secondary | ICD-10-CM

## 2020-11-25 MED ORDER — FLUCONAZOLE 150 MG PO TABS: 150.0000 mg | ORAL_TABLET | Freq: Once | ORAL | 0 refills | Status: AC

## 2020-11-25 NOTE — Telephone Encounter (Signed)
-----   Message from Brock Bad, MD sent at 11/25/2020  8:58 AM EDT ----- Diflucan Rx for yeast

## 2020-11-25 NOTE — Telephone Encounter (Signed)
Patient reviewed results on mychart 

## 2020-12-25 ENCOUNTER — Ambulatory Visit (INDEPENDENT_AMBULATORY_CARE_PROVIDER_SITE_OTHER): Payer: Medicaid Other | Admitting: Obstetrics

## 2020-12-25 ENCOUNTER — Other Ambulatory Visit: Payer: Self-pay

## 2020-12-25 ENCOUNTER — Encounter: Payer: Self-pay | Admitting: Obstetrics

## 2020-12-25 ENCOUNTER — Other Ambulatory Visit (HOSPITAL_COMMUNITY)
Admission: RE | Admit: 2020-12-25 | Discharge: 2020-12-25 | Disposition: A | Discharge status: Home / Self Care | Payer: Medicaid Other | Source: Ambulatory Visit | Attending: Obstetrics | Admitting: Obstetrics

## 2020-12-25 VITALS — BP 123/85 | HR 71 | Ht 66.0 in | Wt 206.6 lb

## 2020-12-25 DIAGNOSIS — Z113 Encounter for screening for infections with a predominantly sexual mode of transmission: Secondary | ICD-10-CM | POA: Diagnosis not present

## 2020-12-25 DIAGNOSIS — N730 Acute parametritis and pelvic cellulitis: Secondary | ICD-10-CM | POA: Diagnosis not present

## 2020-12-25 DIAGNOSIS — N898 Other specified noninflammatory disorders of vagina: Secondary | ICD-10-CM | POA: Insufficient documentation

## 2020-12-25 DIAGNOSIS — N939 Abnormal uterine and vaginal bleeding, unspecified: Secondary | ICD-10-CM

## 2020-12-25 DIAGNOSIS — R102 Pelvic and perineal pain: Secondary | ICD-10-CM

## 2020-12-25 MED ORDER — DOXYCYCLINE HYCLATE 100 MG PO CAPS: 100.0000 mg | ORAL_CAPSULE | Freq: Two times a day (BID) | ORAL | 0 refills | Status: DC

## 2020-12-25 MED ORDER — METRONIDAZOLE 500 MG PO TABS: 500.0000 mg | ORAL_TABLET | Freq: Two times a day (BID) | ORAL | 0 refills | Status: DC

## 2020-12-25 MED ORDER — IBUPROFEN 800 MG PO TABS: 800.0000 mg | ORAL_TABLET | Freq: Three times a day (TID) | ORAL | 5 refills | Status: DC | PRN

## 2020-12-25 NOTE — Progress Notes (Signed)
Patient presents for having heavy periods, cramping, and lower back pain. She would also like to be retested for STDs.    Last Pap: 07/2020 Normal

## 2020-12-25 NOTE — Progress Notes (Signed)
Patient ID: Diamond Burton, female   DOB: 08/05/1985, 35 y.o.   MRN: [id number]  Chief Complaint  Patient presents with   Gynecologic Exam    HPI Kayler Buckholtz is a 35 y.o. female.  Complains of 2 periods during June that were heavy and painful.  Denies fever/chills or dysuria.  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   left   History of gonorrhea    Hydradenitis    Infection    UTI   Kidney stones    Ovarian cyst    Periodontal disease    PID (pelvic inflammatory disease)    PTSD (post-traumatic stress disorder)    Trichomonas contact     Past Surgical History:  Procedure Laterality Date   HYDRADENITIS EXCISION  03/2019   IRRIGATION AND DEBRIDEMENT ABSCESS Left 03/20/2019   Procedure: IRRIGATION AND DEBRIDEMENT BREAST ABSCESS;  Surgeon: Coralie Keens, MD;  Location: [facility] OR;  Service: General;  Laterality: Left;   NO PAST SURGERIES      Family History  Problem Relation Age of Onset   Depression Mother    Diabetes Mother    Hypertension Mother    Heart disease Mother    Asthma Sister    Asthma Daughter    Hypertension Maternal Grandmother    Diabetes Maternal Grandmother     Social History Social History   Tobacco Use   Smoking status: Former    Packs/day: 0.50    Years: 2.00    Pack years: 1.00    Types: Cigarettes   Smokeless tobacco: Never  Vaping Use   Vaping Use: Never used  Substance Use Topics   Alcohol use: Yes    Comment: occ   Drug use: No    Allergies  Allergen Reactions   Latex     Current Outpatient  Medications  Medication Sig Dispense Refill   ALPRAZolam (XANAX) 0.25 MG tablet Take 1 tablet (0.25 mg total) by mouth 2 (two) times daily. 60 tablet 1   clindamycin (CLEOCIN T) 1 % external solution Apply topically 2 (two) times daily.     doxycycline (VIBRAMYCIN) 100 MG capsule Take 1 capsule (100 mg total) by mouth 2 (two) times daily. 14 capsule 0   PARoxetine (PAXIL) 20 MG tablet Take 40 mg by mouth daily.     risperiDONE (RISPERDAL) 1 MG tablet Take 1 mg by mouth at bedtime.     valACYclovir (VALTREX) 1000 MG tablet Take 1,000 mg by mouth daily.     Blood Pressure KIT Monitor BP at home regularly extra large cuff O09.90 (Patient not taking: No sig reported) 1 kit 0   ibuprofen (ADVIL) 800 MG tablet Take 1 tablet (800 mg total) by mouth every 8 (eight) hours as needed. (Patient not taking: No sig reported) 30 tablet 5   No current facility-administered medications for this visit.    Review of Systems Review of Systems Constitutional: negative for fatigue and weight loss Respiratory: negative for cough  and wheezing Cardiovascular: negative for chest pain, fatigue and palpitations Gastrointestinal: negative for abdominal pain and change in bowel habits Genitourinary:positive for abnormal menstrual period Integument/breast: negative for nipple discharge Musculoskeletal:negative for myalgias Neurological: negative for gait problems and tremors Behavioral/Psych: negative for abusive relationship, depression Endocrine: negative for temperature intolerance      Blood pressure 123/85, pulse 71, height $RemoveBe'5\' 6"'GMvPiozYc$  (1.676 m), weight 206 lb 9.6 oz (93.7 kg), last menstrual period 12/06/2020, unknown if currently breastfeeding.  Physical Exam Physical Exam General:   Alert and no distress  Skin:   no rash or abnormalities  Lungs:   clear to auscultation bilaterally  Heart:   regular rate and rhythm, S1, S2 normal, no murmur, click, rub or gallop  Breasts:   Not examined  Abdomen:  normal  findings: no organomegaly, soft, non-tender and no hernia  Pelvis:  External genitalia: normal general appearance Urinary system: urethral meatus normal and bladder without fullness, nontender Vaginal: normal without tenderness, induration or masses Cervix: normal appearance Adnexa: normal bimanual exam Uterus: anteverted and tender, normal size    I have spent a total of 20 minutes of face-to-face time, excluding clinical staff time, reviewing notes and preparing to see patient, ordering tests and/or medications, and counseling the patient.   Data Reviewed Labs  Assessment     1. Abnormal uterine bleeding (AUB)  2. Pelvic pain - R/O PID  3. Vaginal discharge Rx: - Cervicovaginal ancillary only( [place])  4. Screening for STD (sexually transmitted disease) Rx: - Hepatitis B surface antigen - Hepatitis C antibody - HIV Antibody (routine testing w rflx) - RPR     Plan   Follow up in 2 weeks  Orders Placed This Encounter  Procedures   Hepatitis B surface antigen   Hepatitis C antibody   HIV Antibody (routine testing w rflx)   RPR     Shelly Bombard, MD 12/25/2020 9:55 AM

## 2020-12-26 LAB — RPR: RPR Ser Ql: NONREACTIVE

## 2020-12-26 LAB — HEPATITIS B SURFACE ANTIGEN: Hepatitis B Surface Ag: NEGATIVE

## 2020-12-26 LAB — HIV ANTIBODY (ROUTINE TESTING W REFLEX): HIV Screen 4th Generation wRfx: NONREACTIVE

## 2020-12-26 LAB — HEPATITIS C ANTIBODY: Hep C Virus Ab: <0.1 s/co ratio (ref 0.0–0.9)

## 2020-12-28 LAB — CERVICOVAGINAL ANCILLARY ONLY
Bacterial Vaginitis (gardnerella): POSITIVE — AB
Candida Glabrata: NEGATIVE
Candida Vaginitis: POSITIVE — AB
Chlamydia: POSITIVE — AB
Comment: NEGATIVE
Comment: NEGATIVE
Comment: NEGATIVE
Comment: NEGATIVE
Comment: NEGATIVE
Comment: NORMAL
Neisseria Gonorrhea: NEGATIVE
Trichomonas: NEGATIVE

## 2020-12-29 ENCOUNTER — Telehealth: Payer: Self-pay

## 2020-12-29 ENCOUNTER — Other Ambulatory Visit: Payer: Self-pay | Admitting: Obstetrics

## 2020-12-29 DIAGNOSIS — B373 Candidiasis of vulva and vagina: Secondary | ICD-10-CM

## 2020-12-29 DIAGNOSIS — B3731 Acute candidiasis of vulva and vagina: Secondary | ICD-10-CM

## 2020-12-29 NOTE — Telephone Encounter (Signed)
Called patient to discuss recent lab results. Left message on vm for patient to return call to office.

## 2021-01-08 ENCOUNTER — Other Ambulatory Visit: Payer: Self-pay | Admitting: Obstetrics and Gynecology

## 2021-01-11 MED ORDER — FLUCONAZOLE 150 MG PO TABS: 150.0000 mg | ORAL_TABLET | Freq: Once | ORAL | 0 refills | Status: AC

## 2021-01-13 ENCOUNTER — Ambulatory Visit (INDEPENDENT_AMBULATORY_CARE_PROVIDER_SITE_OTHER): Payer: Medicaid Other | Admitting: *Deleted

## 2021-01-13 ENCOUNTER — Other Ambulatory Visit: Payer: Self-pay

## 2021-01-13 DIAGNOSIS — Z3201 Encounter for pregnancy test, result positive: Secondary | ICD-10-CM

## 2021-01-13 DIAGNOSIS — N730 Acute parametritis and pelvic cellulitis: Secondary | ICD-10-CM

## 2021-01-13 LAB — POCT URINE PREGNANCY: Preg Test, Ur: POSITIVE — AB

## 2021-01-13 MED ORDER — VALACYCLOVIR HCL 500 MG PO TABS: 500.0000 mg | ORAL_TABLET | Freq: Two times a day (BID) | ORAL | 6 refills | Status: DC

## 2021-01-13 MED ORDER — PREPLUS 27-1 MG PO TABS: 1.0000 | ORAL_TABLET | Freq: Every day | ORAL | 13 refills | Status: DC

## 2021-01-13 MED ORDER — METRONIDAZOLE 500 MG PO TABS: 500.0000 mg | ORAL_TABLET | Freq: Two times a day (BID) | ORAL | 0 refills | Status: DC

## 2021-01-13 NOTE — Progress Notes (Signed)
Ms. Laux presents today for UPT. She has no unusual complaints. LMP:    OBJECTIVE: Appears well, in no apparent distress.  OB History     Gravida  5   Para  4   Term  4   Preterm      AB  1   Living  4      SAB  1   IAB      Ectopic      Multiple  0   Live Births  4          Home UPT Result: positive In-Office UPT result:positive I have reviewed the patient's medical, obstetrical, social, and family histories, and medications. Patient reports recent visit for BV, did not pick up RX. Also reports no refills on Valtrex.  ASSESSMENT: Positive pregnancy test  PLAN Prenatal care to be completed at: [facility] RX sent for PNV, Flagyl, and Valtrex.

## 2021-02-03 ENCOUNTER — Ambulatory Visit: Payer: Medicaid Other

## 2021-02-03 ENCOUNTER — Other Ambulatory Visit: Payer: Self-pay

## 2021-02-03 ENCOUNTER — Ambulatory Visit (INDEPENDENT_AMBULATORY_CARE_PROVIDER_SITE_OTHER): Payer: Medicaid Other | Admitting: Obstetrics

## 2021-02-03 ENCOUNTER — Ambulatory Visit (INDEPENDENT_AMBULATORY_CARE_PROVIDER_SITE_OTHER): Payer: Medicaid Other

## 2021-02-03 ENCOUNTER — Other Ambulatory Visit (HOSPITAL_COMMUNITY)
Admission: RE | Admit: 2021-02-03 | Discharge: 2021-02-03 | Disposition: A | Discharge status: Home / Self Care | Payer: Medicaid Other | Source: Ambulatory Visit | Attending: Obstetrics | Admitting: Obstetrics

## 2021-02-03 ENCOUNTER — Encounter: Payer: Self-pay | Admitting: Obstetrics

## 2021-02-03 ENCOUNTER — Ambulatory Visit: Payer: Self-pay

## 2021-02-03 VITALS — BP 122/75 | HR 76 | Ht 66.0 in | Wt 212.0 lb

## 2021-02-03 DIAGNOSIS — O3680X0 Pregnancy with inconclusive fetal viability, not applicable or unspecified: Secondary | ICD-10-CM

## 2021-02-03 DIAGNOSIS — E669 Obesity, unspecified: Secondary | ICD-10-CM

## 2021-02-03 DIAGNOSIS — Z3481 Encounter for supervision of other normal pregnancy, first trimester: Secondary | ICD-10-CM

## 2021-02-03 DIAGNOSIS — N898 Other specified noninflammatory disorders of vagina: Secondary | ICD-10-CM | POA: Insufficient documentation

## 2021-02-03 DIAGNOSIS — Z3490 Encounter for supervision of normal pregnancy, unspecified, unspecified trimester: Secondary | ICD-10-CM

## 2021-02-03 DIAGNOSIS — Z331 Pregnant state, incidental: Secondary | ICD-10-CM | POA: Diagnosis not present

## 2021-02-03 DIAGNOSIS — Z3491 Encounter for supervision of normal pregnancy, unspecified, first trimester: Secondary | ICD-10-CM | POA: Insufficient documentation

## 2021-02-03 DIAGNOSIS — N9412 Deep dyspareunia: Secondary | ICD-10-CM | POA: Diagnosis not present

## 2021-02-03 DIAGNOSIS — Z3A08 8 weeks gestation of pregnancy: Secondary | ICD-10-CM | POA: Diagnosis not present

## 2021-02-03 MED ORDER — BLOOD PRESSURE KIT DEVI: 1.0000 | 0 refills | Status: DC

## 2021-02-03 MED ORDER — GOJJI WEIGHT SCALE MISC: 1.0000 | 0 refills | Status: DC

## 2021-02-03 MED ORDER — MEDROXYPROGESTERONE ACETATE 150 MG/ML IM SUSP: 150.0000 mg | INTRAMUSCULAR | 4 refills | Status: DC

## 2021-02-03 NOTE — Progress Notes (Deleted)
New OB Intake completed. Last PAP 07/15/20, normal  OB Panel, OB Urine Culture, GC/CC today. A1C pended for obesity in pregnancy. Genetic screening offered and.... Depression and anxiety screen.Diamond KitchenMarland KitchenMarland Burton

## 2021-02-03 NOTE — Progress Notes (Signed)
Patient ID: Diamond Burton, female   DOB: 01-10-1986, 35 y.o.   MRN: [id number]   HPI Diamond Burton is a 35 y.o. female.  Complains of pain with intercourse and vaginal discharge. 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   left   History of gonorrhea    Hydradenitis    Infection    UTI   Kidney stones    Ovarian cyst    Periodontal disease    PID (pelvic inflammatory disease)    PTSD (post-traumatic stress disorder)    Trichomonas contact     Past Surgical History:  Procedure Laterality Date   HYDRADENITIS EXCISION  03/2019   IRRIGATION AND DEBRIDEMENT ABSCESS Left 03/20/2019   Procedure: IRRIGATION AND DEBRIDEMENT BREAST ABSCESS;  Surgeon: Coralie Keens, MD;  Location: [facility] OR;  Service: General;  Laterality: Left;   NO PAST SURGERIES      Family History  Problem Relation Age of Onset   Depression Mother    Diabetes Mother    Hypertension Mother    Heart disease Mother    Asthma Sister    Asthma Daughter    Hypertension Maternal Grandmother    Diabetes Maternal Grandmother     Social History Social History   Tobacco Use   Smoking status: Former    Packs/day: 0.50    Years: 2.00    Pack years: 1.00    Types: Cigarettes   Smokeless tobacco: Never  Vaping Use   Vaping Use: Never used  Substance Use Topics   Alcohol use: Not Currently    Comment: not since confirmed pregnancy   Drug use: No    Allergies  Allergen Reactions   Latex     Current Outpatient Medications  Medication Sig Dispense Refill   Blood Pressure Monitoring (BLOOD  PRESSURE KIT) DEVI 1 kit by Does not apply route once a week. 1 each 0   clindamycin (CLEOCIN T) 1 % external solution Apply topically 2 (two) times daily.     Misc. Devices (GOJJI WEIGHT SCALE) MISC 1 Device by Does not apply route every 30 (thirty) days. 1 each 0   Prenatal Vit-Fe Fumarate-FA (PREPLUS) 27-1 MG TABS Take 1 tablet by mouth daily. 30 tablet 13   valACYclovir (VALTREX) 500 MG tablet Take 1 tablet (500 mg total) by mouth 2 (two) times daily. 60 tablet 6   No current facility-administered medications for this visit.    Review of Systems Review of Systems Constitutional: negative for fatigue and weight loss Respiratory: negative for cough and wheezing Cardiovascular: negative for chest pain, fatigue and palpitations Gastrointestinal: negative for abdominal pain and change in bowel habits Genitourinary: positive for vaginal discharge and painful intercourse Integument/breast: negative for nipple discharge Musculoskeletal:negative for myalgias Neurological: negative for gait problems and tremors Behavioral/Psych: negative for abusive relationship, depression Endocrine: negative for temperature intolerance      Last menstrual period 12/06/2020, unknown if currently breastfeeding.  Physical Exam Physical Exam General:   Alert and no distress  Skin:   no rash or abnormalities  Lungs:   clear to auscultation bilaterally  Heart:   regular rate and rhythm, S1, S2 normal, no murmur, click, rub or gallop  Breasts:   Not examined  Abdomen:  normal findings: no organomegaly, soft, non-tender and no hernia  Pelvis:  External genitalia: normal general appearance Urinary system: urethral meatus normal and bladder without fullness, nontender Vaginal: normal without tenderness, induration or masses Cervix: normal appearance Adnexa: normal bimanual exam Uterus: anteverted and non-tender, normal size    I have spent a total of 20 minutes of face-to-face time, excluding clinical staff  time, reviewing notes and preparing to see patient, ordering tests and/or medications, and counseling the patient.   Data Reviewed Wet Prep  Assessment     1. Vaginal discharge Rx: - Cervicovaginal ancillary only( [place])  2. Deep dyspareunia  3. Early stage of pregnancy  4. Obesity (BMI 30.0-34.9)      Plan   Follow up in 4 weeks   Meds ordered this encounter  Medications   DISCONTD: medroxyPROGESTERone (DEPO-PROVERA) 150 MG/ML injection    Sig: Inject 1 mL (150 mg total) into the muscle every 3 (three) months.    Dispense:  1 mL    Refill:  4      Shelly Bombard, MD 02/03/2021 3:10 PM

## 2021-02-03 NOTE — Progress Notes (Signed)
New OB Intake  I connected with  Diamond Burton on 02/03/21 at  1:15 PM EDT by in person. Video Visit and verified that I am speaking with the correct person using two identifiers. Nurse is located at [facility] and pt is located at [place].  I discussed the limitations, risks, security and privacy concerns of performing an evaluation and management service by telephone and the availability of in person appointments. I also discussed with the patient that there may be a patient responsible charge related to this service. The patient expressed understanding and agreed to proceed.  I explained I am completing New OB Intake today. We discussed her EDD of 09/12/21 that is based on LMP of 12/06/20. Pt is G6/P4014. I reviewed her allergies, medications, Medical/Surgical/OB history, and appropriate screenings. I informed her of [facility] services. Based on history, this is a/an  pregnancy uncomplicated .   Patient Active Problem List   Diagnosis Date Noted   Indication for care in labor or delivery 04/29/2019   Normal vaginal delivery 04/29/2019   Obstetric labial laceration, delivered, current hospitalization 04/29/2019   Breast abscess 03/19/2019   Group B streptococcal bacteriuria 10/18/2018   Chlamydia infection affecting pregnancy in first trimester 10/10/2018   Gonorrhea affecting pregnancy in first trimester 10/10/2018   Supervision of high-risk pregnancy 10/08/2018   Homicidal ideation 09/11/2018   Suicidal ideation 09/11/2018   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   History of low birth weight infant 11/28/2016   Rh negative state in antepartum period 08/02/2016   Obesity in pregnancy, antepartum 08/02/2016   History of herpes genitalis 06/01/2016   History of maternal syphilis, currently pregnant 06/01/2016   Hydradenitis     Concerns addressed today  Delivery Plans:  Plans to deliver at [facility].   MyChart/Babyscripts MyChart access verified. I explained pt will have some visits in office  and some virtually. Babyscripts instructions given and order placed. Patient verifies receipt of registration text/e-mail. Account successfully created and app downloaded.  Blood Pressure Cuff  Blood pressure cuff ordered for patient to pick-up from [employer]. Explained after first prenatal appt pt will check weekly and document in Babyscripts.  Weight scale: Patient does not have weight scale. Weight scale ordered for patient to pick up form [facility].   Anatomy US Explained first scheduled [place] will be around 19 weeks. Dating and viability scan performed today  Labs Discussed Avelina Laine genetic screening with patient. Would like both Panorama and Horizon drawn at new OB visit. Routine prenatal labs needed.  Covid Vaccine Patient has not covid vaccine.   Mother/ Baby Dyad Candidate?    If yes, offer as possibility  Informed patient of [facility] website  and placed link in her AVS.   Social Determinants of Health Food Insecurity: Patient denies food insecurity. WIC Referral: Patient is interested in referral to [facility].  Transportation: Patient denies transportation needs. Childcare: Discussed no children allowed at ultrasound appointments. Offered childcare services; patient declines childcare services at this time.  Send link to Pregnancy Navigators   Placed OB Box on problem list and updated  First visit review I reviewed new OB appt with pt. I explained she will have a pelvic exam, ob bloodwork with genetic screening, and PAP smear. Explained pt will be seen by Coral Ceo at first visit; encounter routed to appropriate provider. Explained that patient will be seen by pregnancy navigator following visit with provider. [facility] information placed in AVS.   Hamilton Capri, RN 02/03/2021  1:18 PM

## 2021-02-03 NOTE — Progress Notes (Deleted)
New OB Intake  I connected with  Diamond Burton on 02/03/21 at  2:30 PM EDT by {Contact:24193} Video Visit and verified that I am speaking with the correct person using two identifiers. Nurse is located at [facility] and pt is located at ***.  I discussed the limitations, risks, security and privacy concerns of performing an evaluation and management service by telephone and the availability of in person appointments. I also discussed with the patient that there may be a patient responsible charge related to this service. The patient expressed understanding and agreed to proceed.  I explained I am completing New OB Intake today. We discussed her EDD of *** that is based on LMP of ***. Pt is G***/P***. I reviewed her allergies, medications, Medical/Surgical/OB history, and appropriate screenings. I informed her of [facility] services. Based on history, this is a/an  pregnancy {Complicated/Uncomplicated Pregnancy:20185} .   Patient Active Problem List   Diagnosis Date Noted   Indication for care in labor or delivery 04/29/2019   Normal vaginal delivery 04/29/2019   Obstetric labial laceration, delivered, current hospitalization 04/29/2019   Breast abscess 03/19/2019   Group B streptococcal bacteriuria 10/18/2018   Chlamydia infection affecting pregnancy in first trimester 10/10/2018   Gonorrhea affecting pregnancy in first trimester 10/10/2018   Supervision of high-risk pregnancy 10/08/2018   Homicidal ideation 09/11/2018   Suicidal ideation 09/11/2018   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   History of low birth weight infant 11/28/2016   Rh negative state in antepartum period 08/02/2016   Obesity in pregnancy, antepartum 08/02/2016   History of herpes genitalis 06/01/2016   History of maternal syphilis, currently pregnant 06/01/2016   Hydradenitis     Concerns addressed today  Delivery Plans:  Plans to deliver at [facility].   MyChart/Babyscripts MyChart access verified. I explained pt will  have some visits in office and some virtually. Babyscripts instructions given and order placed. Patient verifies receipt of registration text/e-mail. Account successfully created and app downloaded.  Blood Pressure Cuff  {blood pressure cuff:24241} Explained after first prenatal appt pt will check weekly and document in Babyscripts.  Weight scale: Patient    have weight scale. Weight scale ordered   Anatomy US Explained first scheduled [place] will be around 19 weeks. Anatomy US scheduled for *** at ***. Pt notified to arrive at ***.  Labs Discussed Avelina Laine genetic screening with patient. Would like both Panorama and Horizon drawn at new OB visit. Routine prenatal labs needed.  Covid Vaccine Patient {HAS/HAS NOT:20194} covid vaccine.   Social Determinants of Health Food Insecurity: {food:25541} WIC Referral: Patient {ACTION; IS/IS JHE:[id number]} interested in referral to [facility].  Transportation: {transportation:25542} Childcare: Discussed no children allowed at ultrasound appointments. Offered childcare services; {childcare:25540}  First visit review I reviewed new OB appt with pt. I explained she will have a pelvic exam, ob bloodwork with genetic screening, and PAP smear. Explained pt will be seen by *** at first visit; encounter routed to appropriate provider. Explained that patient will be seen by pregnancy navigator following visit with provider. [facility] information placed in AVS.   Harrel Lemon, RN 02/03/2021  1:11 PM

## 2021-02-04 ENCOUNTER — Other Ambulatory Visit: Payer: Self-pay | Admitting: Obstetrics

## 2021-02-04 DIAGNOSIS — B373 Candidiasis of vulva and vagina: Secondary | ICD-10-CM

## 2021-02-04 DIAGNOSIS — B9689 Other specified bacterial agents as the cause of diseases classified elsewhere: Secondary | ICD-10-CM

## 2021-02-04 DIAGNOSIS — N760 Acute vaginitis: Secondary | ICD-10-CM

## 2021-02-04 DIAGNOSIS — A749 Chlamydial infection, unspecified: Secondary | ICD-10-CM

## 2021-02-04 DIAGNOSIS — B3731 Acute candidiasis of vulva and vagina: Secondary | ICD-10-CM

## 2021-02-04 LAB — CERVICOVAGINAL ANCILLARY ONLY
Bacterial Vaginitis (gardnerella): NEGATIVE
Candida Glabrata: NEGATIVE
Candida Vaginitis: POSITIVE — AB
Chlamydia: POSITIVE — AB
Comment: NEGATIVE
Comment: NEGATIVE
Comment: NEGATIVE
Comment: NEGATIVE
Comment: NEGATIVE
Comment: NORMAL
Neisseria Gonorrhea: NEGATIVE
Trichomonas: POSITIVE — AB

## 2021-02-04 MED ORDER — TERCONAZOLE 0.8 % VA CREA: 1.0000 | TOPICAL_CREAM | Freq: Every day | VAGINAL | 0 refills | Status: DC

## 2021-02-04 MED ORDER — METRONIDAZOLE 500 MG PO TABS: 500.0000 mg | ORAL_TABLET | Freq: Two times a day (BID) | ORAL | 2 refills | Status: DC

## 2021-02-04 MED ORDER — AZITHROMYCIN 500 MG PO TABS: 1000.0000 mg | ORAL_TABLET | Freq: Once | ORAL | 0 refills | Status: AC

## 2021-03-03 ENCOUNTER — Ambulatory Visit (INDEPENDENT_AMBULATORY_CARE_PROVIDER_SITE_OTHER): Payer: Medicaid Other | Admitting: Obstetrics

## 2021-03-03 ENCOUNTER — Encounter: Payer: Medicaid Other | Admitting: Obstetrics

## 2021-03-03 ENCOUNTER — Other Ambulatory Visit: Payer: Self-pay

## 2021-03-03 ENCOUNTER — Other Ambulatory Visit (HOSPITAL_COMMUNITY)
Admission: RE | Admit: 2021-03-03 | Discharge: 2021-03-03 | Disposition: A | Discharge status: Home / Self Care | Payer: Medicaid Other | Source: Ambulatory Visit | Attending: Obstetrics | Admitting: Obstetrics

## 2021-03-03 ENCOUNTER — Encounter: Payer: Self-pay | Admitting: Obstetrics

## 2021-03-03 VITALS — BP 123/66 | HR 89 | Wt 214.0 lb

## 2021-03-03 DIAGNOSIS — O09529 Supervision of elderly multigravida, unspecified trimester: Secondary | ICD-10-CM | POA: Diagnosis not present

## 2021-03-03 DIAGNOSIS — O0990 Supervision of high risk pregnancy, unspecified, unspecified trimester: Secondary | ICD-10-CM | POA: Diagnosis not present

## 2021-03-03 DIAGNOSIS — B373 Candidiasis of vulva and vagina: Secondary | ICD-10-CM

## 2021-03-03 DIAGNOSIS — O99210 Obesity complicating pregnancy, unspecified trimester: Secondary | ICD-10-CM | POA: Diagnosis not present

## 2021-03-03 DIAGNOSIS — N760 Acute vaginitis: Secondary | ICD-10-CM

## 2021-03-03 DIAGNOSIS — O09299 Supervision of pregnancy with other poor reproductive or obstetric history, unspecified trimester: Secondary | ICD-10-CM | POA: Diagnosis not present

## 2021-03-03 DIAGNOSIS — B9689 Other specified bacterial agents as the cause of diseases classified elsewhere: Secondary | ICD-10-CM

## 2021-03-03 DIAGNOSIS — B3731 Acute candidiasis of vulva and vagina: Secondary | ICD-10-CM

## 2021-03-03 MED ORDER — METRONIDAZOLE 500 MG PO TABS: 500.0000 mg | ORAL_TABLET | Freq: Two times a day (BID) | ORAL | 2 refills | Status: DC

## 2021-03-03 MED ORDER — TERCONAZOLE 0.8 % VA CREA: 1.0000 | TOPICAL_CREAM | Freq: Every day | VAGINAL | 0 refills | Status: DC

## 2021-03-03 MED ORDER — ASPIRIN 81 MG PO CHEW: 81.0000 mg | CHEWABLE_TABLET | Freq: Every day | ORAL | 5 refills | Status: DC

## 2021-03-03 MED ORDER — BLOOD PRESSURE KIT DEVI
1.0000 | 0 refills | Status: DC
Start: 2021-03-03 — End: 2021-04-22

## 2021-03-03 MED ORDER — GOJJI WEIGHT SCALE MISC
1.0000 | 0 refills | Status: DC
Start: 2021-03-03 — End: 2021-04-22

## 2021-03-03 NOTE — Progress Notes (Signed)
New OB  OB panel, OB urine, PAP Recent tx chlamydia and trichomonas (02/03/21) Genetic screening offered and accepted. Depression and anxiety screen positive: already sees a psychiatrist, off meds. Thinks may have yeast infection.

## 2021-03-03 NOTE — Progress Notes (Signed)
Subjective:    Diamond Burton is being seen today for her first obstetrical visit.  This is not a planned pregnancy. She is at [redacted]w[redacted]d gestation. Her obstetrical history is significant for intrauterine growth restriction (IUGR) and obesity. Relationship with FOB: significant other, not living together. Patient does intend to breast feed. Pregnancy history fully reviewed.  The information documented in the HPI was reviewed and verified.  Menstrual History: OB History     Gravida  6   Para  4   Term  4   Preterm      AB  1   Living  4      SAB  1   IAB      Ectopic      Multiple  0   Live Births  4            Patient's last menstrual period was 12/06/2020.    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   left   History of gonorrhea    Hydradenitis    Infection    UTI   Kidney stones    Ovarian cyst    Periodontal disease    PID (pelvic inflammatory disease)    PTSD (post-traumatic stress disorder)    Trichomonas contact     Past Surgical History:  Procedure Laterality Date   HYDRADENITIS EXCISION  03/2019   IRRIGATION AND DEBRIDEMENT ABSCESS Left 03/20/2019   Procedure: IRRIGATION AND DEBRIDEMENT BREAST ABSCESS;  Surgeon: Coralie Keens, MD;  Location: [facility];  Service: General;  Laterality: Left;   NO PAST SURGERIES      (Not in a hospital admission)  Allergies  Allergen Reactions   Latex     Social History   Tobacco Use   Smoking status: Former    Packs/day: 0.50    Years: 2.00    Pack years: 1.00    Types: Cigarettes   Smokeless  tobacco: Never  Substance Use Topics   Alcohol use: Not Currently    Comment: not since confirmed pregnancy    Family History  Problem Relation Age of Onset   Depression Mother    Diabetes Mother    Hypertension Mother    Heart disease Mother    Asthma Sister    Asthma Daughter    Hypertension Maternal Grandmother    Diabetes Maternal Grandmother      Review of Systems Constitutional: negative for weight loss Gastrointestinal: negative for vomiting Genitourinary positive for vaginal discharge and itching.  :negative for genital lesions and dysuria Musculoskeletal:negative for back pain Behavioral/Psych: negative for abusive relationship, depression, illegal drug usage and tobacco use    Objective:    BP 123/66   Pulse 89   Wt 214 lb (97.1 kg)   LMP 12/06/2020   BMI 34.54 kg/m  General Appearance:    Alert, cooperative, no distress, appears stated age  Head:    Normocephalic, without obvious abnormality, atraumatic  Eyes:    PERRL, conjunctiva/corneas clear, EOM's intact, fundi  benign, both eyes  Ears:    Normal TM's and external ear canals, both ears  Nose:   Nares normal, septum midline, mucosa normal, no drainage    or sinus tenderness  Throat:   Lips, mucosa, and tongue normal; teeth and gums normal  Neck:   Supple, symmetrical, trachea midline, no adenopathy;    thyroid:  no enlargement/tenderness/nodules; no carotid   bruit or JVD  Back:     Symmetric, no curvature, ROM normal, no CVA tenderness  Lungs:     Clear to auscultation bilaterally, respirations unlabored  Chest Wall:    No tenderness or deformity   Heart:    Regular rate and rhythm, S1 and S2 normal, no murmur, rub   or gallop  Breast Exam:    No tenderness, masses, or nipple abnormality  Abdomen:     Soft, non-tender, bowel sounds active all four quadrants,    no masses, no organomegaly  Genitalia:    Normal female without lesion, discharge or tenderness  Extremities:   Extremities normal,  atraumatic, no cyanosis or edema  Pulses:   2+ and symmetric all extremities  Skin:   Skin color, texture, turgor normal, no rashes or lesions  Lymph nodes:   Cervical, supraclavicular, and axillary nodes normal  Neurologic:   CNII-XII intact, normal strength, sensation and reflexes    throughout      Lab Review Urine pregnancy test Labs reviewed yes Radiologic studies reviewed no  Assessment:    Pregnancy at [redacted]w[redacted]d weeks    Plan:    1. Supervision of high risk pregnancy, antepartum Rx: - CBC/D/Plt+RPR+Rh+ABO+RubIgG... - Culture, OB Urine - Genetic Screening - Cervicovaginal ancillary only( [place]) - Hemoglobin A1c  2. Antepartum multigravida of advanced maternal age  35. IUGR (intrauterine growth retardation) in prior pregnancy, pregnant  4. Obesity affecting pregnancy, antepartum Rx: - aspirin 81 MG chewable tablet; Chew 1 tablet (81 mg total) by mouth daily.  Dispense: 30 tablet; Refill: 5  5. Candida vaginitis Rx: - terconazole (TERAZOL 3) 0.8 % vaginal cream; Place 1 applicator vaginally at bedtime.  Dispense: 20 g; Refill: 0  6. BV (bacterial vaginosis) Rx: - metroNIDAZOLE (FLAGYL) 500 MG tablet; Take 1 tablet (500 mg total) by mouth 2 (two) times daily.  Dispense: 14 tablet; Refill: 2    Prenatal vitamins.  Counseling provided regarding continued use of seat belts, cessation of alcohol consumption, smoking or use of illicit drugs; infection precautions i.e., influenza/TDAP immunizations, toxoplasmosis,CMV, parvovirus, listeria and varicella; workplace safety, exercise during pregnancy; routine dental care, safe medications, sexual activity, hot tubs, saunas, pools, travel, caffeine use, fish and methlymercury, potential toxins, hair treatments, varicose veins Weight gain recommendations per IOM guidelines reviewed: underweight/BMI< 18.5--> gain 28 - 40 lbs; normal weight/BMI 18.5 - 24.9--> gain 25 - 35 lbs; overweight/BMI 25 - 29.9--> gain 15 - 25 lbs;  obese/BMI >30->gain  11 - 20 lbs Problem list reviewed and updated. FIRST/CF mutation testing/NIPT/QUAD SCREEN/fragile X/Ashkenazi Jewish population testing/Spinal muscular atrophy discussed: requested. Role of ultrasound in pregnancy discussed; fetal survey: requested. Amniocentesis discussed: yes, declined.  Meds ordered this encounter  Medications   Blood Pressure Monitoring (BLOOD PRESSURE KIT) DEVI    Sig: 1 Device by Does not apply route once a week.    Dispense:  1 each    Refill:  0   Misc. Devices (GOJJI WEIGHT SCALE) MISC    Sig: 1 Device by Does not apply route once a week.    Dispense:  1 each  Refill:  0   aspirin 81 MG chewable tablet    Sig: Chew 1 tablet (81 mg total) by mouth daily.    Dispense:  30 tablet    Refill:  5   metroNIDAZOLE (FLAGYL) 500 MG tablet    Sig: Take 1 tablet (500 mg total) by mouth 2 (two) times daily.    Dispense:  14 tablet    Refill:  2   terconazole (TERAZOL 3) 0.8 % vaginal cream    Sig: Place 1 applicator vaginally at bedtime.    Dispense:  20 g    Refill:  0   Orders Placed This Encounter  Procedures   Culture, OB Urine   CBC/D/Plt+RPR+Rh+ABO+RubIgG...   Genetic Screening    [place]AN[place]AMA   Hemoglobin A1c    Follow up in 4 weeks.  I have spent a total of 20 minutes of face-to-face time, excluding clinical staff time, reviewing notes and preparing to see patient, ordering tests and/or medications, and counseling the patient.   Shelly Bombard, MD 03/03/2021 12:03 PM

## 2021-03-03 NOTE — Addendum Note (Signed)
Addended by: Coral Ceo A on: 03/03/2021 12:30 PM   Modules accepted: Orders

## 2021-03-04 ENCOUNTER — Other Ambulatory Visit: Payer: Self-pay | Admitting: Obstetrics

## 2021-03-04 LAB — CBC/D/PLT+RPR+RH+ABO+RUBIGG...
Antibody Screen: NEGATIVE
Basophils Absolute: 0.1 10*3/uL (ref 0.0–0.2)
Basos: 1 %
EOS (ABSOLUTE): 0.1 10*3/uL (ref 0.0–0.4)
Eos: 1 %
HCV Ab: <0.1 s/co ratio (ref 0.0–0.9)
HIV Screen 4th Generation wRfx: NONREACTIVE
Hematocrit: 37.3 % (ref 34.0–46.6)
Hemoglobin: 12.2 g/dL (ref 11.1–15.9)
Hepatitis B Surface Ag: NEGATIVE
Immature Grans (Abs): 0 10*3/uL (ref 0.0–0.1)
Immature Granulocytes: 0 %
Lymphocytes Absolute: 2.5 10*3/uL (ref 0.7–3.1)
Lymphs: 33 %
MCH: 29.6 pg (ref 26.6–33.0)
MCHC: 32.7 g/dL (ref 31.5–35.7)
MCV: 91 fL (ref 79–97)
Monocytes Absolute: 0.4 10*3/uL (ref 0.1–0.9)
Monocytes: 6 %
Neutrophils Absolute: 4.5 10*3/uL (ref 1.4–7.0)
Neutrophils: 59 %
Platelets: 295 10*3/uL (ref 150–450)
RBC: 4.12 x10E6/uL (ref 3.77–5.28)
RDW: 13.3 % (ref 11.7–15.4)
RPR Ser Ql: NONREACTIVE
Rh Factor: NEGATIVE
Rubella Antibodies, IGG: 1.77 index (ref >0.99)
WBC: 7.5 10*3/uL (ref 3.4–10.8)

## 2021-03-04 LAB — CERVICOVAGINAL ANCILLARY ONLY
Bacterial Vaginitis (gardnerella): NEGATIVE
Candida Glabrata: NEGATIVE
Candida Vaginitis: POSITIVE — AB
Chlamydia: NEGATIVE
Comment: NEGATIVE
Comment: NEGATIVE
Comment: NEGATIVE
Comment: NEGATIVE
Comment: NEGATIVE
Comment: NORMAL
Neisseria Gonorrhea: NEGATIVE
Trichomonas: NEGATIVE

## 2021-03-04 LAB — HEMOGLOBIN A1C
Est. average glucose Bld gHb Est-mCnc: 114 mg/dL
Hgb A1c MFr Bld: 5.6 % (ref 4.8–5.6)

## 2021-03-04 LAB — HCV INTERPRETATION

## 2021-03-05 LAB — CULTURE, OB URINE

## 2021-03-05 LAB — URINE CULTURE, OB REFLEX

## 2021-03-10 ENCOUNTER — Other Ambulatory Visit: Payer: Self-pay | Admitting: *Deleted

## 2021-03-10 DIAGNOSIS — O0990 Supervision of high risk pregnancy, unspecified, unspecified trimester: Secondary | ICD-10-CM

## 2021-03-10 NOTE — Progress Notes (Signed)
Change in u/s ordered entered- should be detailed scan.

## 2021-03-12 ENCOUNTER — Encounter: Payer: Self-pay | Admitting: Obstetrics

## 2021-03-19 ENCOUNTER — Encounter: Payer: Self-pay | Admitting: Obstetrics

## 2021-03-31 ENCOUNTER — Other Ambulatory Visit: Payer: Self-pay

## 2021-03-31 ENCOUNTER — Other Ambulatory Visit (HOSPITAL_COMMUNITY)
Admission: RE | Admit: 2021-03-31 | Discharge: 2021-03-31 | Disposition: A | Discharge status: Home / Self Care | Payer: Medicaid Other | Source: Ambulatory Visit | Attending: Women's Health | Admitting: Women's Health

## 2021-03-31 ENCOUNTER — Ambulatory Visit (INDEPENDENT_AMBULATORY_CARE_PROVIDER_SITE_OTHER): Payer: Medicaid Other | Admitting: Obstetrics

## 2021-03-31 ENCOUNTER — Encounter: Payer: Medicaid Other | Admitting: Women's Health

## 2021-03-31 VITALS — BP 131/72 | HR 97 | Temp 99.0°F | Wt 215.9 lb

## 2021-03-31 DIAGNOSIS — R300 Dysuria: Secondary | ICD-10-CM | POA: Diagnosis not present

## 2021-03-31 DIAGNOSIS — O285 Abnormal chromosomal and genetic finding on antenatal screening of mother: Secondary | ICD-10-CM

## 2021-03-31 DIAGNOSIS — O0990 Supervision of high risk pregnancy, unspecified, unspecified trimester: Secondary | ICD-10-CM | POA: Diagnosis present

## 2021-03-31 DIAGNOSIS — B3731 Acute candidiasis of vulva and vagina: Secondary | ICD-10-CM

## 2021-03-31 DIAGNOSIS — O0992 Supervision of high risk pregnancy, unspecified, second trimester: Secondary | ICD-10-CM

## 2021-03-31 DIAGNOSIS — O09522 Supervision of elderly multigravida, second trimester: Secondary | ICD-10-CM

## 2021-03-31 DIAGNOSIS — L0292 Furuncle, unspecified: Secondary | ICD-10-CM

## 2021-03-31 DIAGNOSIS — Z3A16 16 weeks gestation of pregnancy: Secondary | ICD-10-CM

## 2021-03-31 DIAGNOSIS — O99212 Obesity complicating pregnancy, second trimester: Secondary | ICD-10-CM

## 2021-03-31 DIAGNOSIS — O99210 Obesity complicating pregnancy, unspecified trimester: Secondary | ICD-10-CM

## 2021-03-31 MED ORDER — TERCONAZOLE 0.4 % VA CREA
1.0000 | TOPICAL_CREAM | Freq: Every day | VAGINAL | 0 refills | Status: DC
Start: 2021-03-31 — End: 2021-04-22

## 2021-03-31 MED ORDER — CLINDAMYCIN PHOSPHATE 1 % EX SOLN: Freq: Two times a day (BID) | CUTANEOUS | 2 refills | Status: DC

## 2021-03-31 MED ORDER — ASPIRIN 81 MG PO CHEW: 81.0000 mg | CHEWABLE_TABLET | Freq: Every day | ORAL | 5 refills | Status: DC

## 2021-03-31 NOTE — Progress Notes (Signed)
Reports vaginal irritation and discharge. Reports "a sore down there". Area where sore burns with urination. Reports vaginal itching.  Needs RX clindamycin 1% external solution  Initial FHR 174, rechecked 164, frequent fetal movement heard.

## 2021-04-01 ENCOUNTER — Encounter: Payer: Self-pay | Admitting: Obstetrics

## 2021-04-01 NOTE — Progress Notes (Signed)
Subjective:  Diamond Burton is a 35 y.o. [id number] at 108w4d being seen today for ongoing prenatal care.  She is currently monitored for the following issues for this low-risk pregnancy and has Hydradenitis; History of herpes genitalis; History of maternal syphilis, currently pregnant; Rh negative state in antepartum period; Obesity in pregnancy, antepartum; History of low birth weight infant; History of Erb's palsy; Homicidal ideation; Suicidal ideation; Supervision of high-risk pregnancy; Chlamydia infection affecting pregnancy in first trimester; Gonorrhea affecting pregnancy in first trimester; Group B streptococcal bacteriuria; Breast abscess; Indication for care in labor or delivery; Normal vaginal delivery; Obstetric labial laceration, delivered, current hospitalization; and Encounter for supervision of normal pregnancy in first trimester on their problem list.  Patient reports  " sore" in vaginal area .   . Vag. Bleeding: None.  Movement: Present. Denies leaking of fluid.   The following portions of the patient's history were reviewed and updated as appropriate: allergies, current medications, past family history, past medical history, past social history, past surgical history and problem list. Problem list updated.  Objective:   Vitals:   03/31/21 1558  BP: 131/72  Pulse: 97  Temp: 99 F (37.2 C)  Weight: 215 lb 14.4 oz (97.9 kg)    Fetal Status: Fetal Heart Rate (bpm): 164   Movement: Present     General:  Alert, oriented and cooperative. Patient is in no acute distress.  Skin: Skin is warm and dry. No rash noted.   Cardiovascular: Normal heart rate noted  Respiratory: Normal respiratory effort, no problems with respiration noted  Abdomen: Soft, gravid, appropriate for gestational age. Pain/Pressure: Absent     Pelvic:  Cervical exam deferred        Extremities: Normal range of motion.  Edema: None  Mental Status: Normal mood and affect. Normal behavior. Normal judgment and  thought content.   Urinalysis:      Assessment and Plan:  Pregnancy: [id number] at [redacted]w[redacted]d  1. Supervision of high risk pregnancy, antepartum Rx: - Cervicovaginal ancillary only( [place]) - AFP, Serum, Open Spina Bifida  2. AMA (advanced maternal age) multigravida 35+, second trimester Rx:  3. Abnormal chromosomal and genetic finding on antenatal screening mother Rx: - AMB MFM GENETICS REFERRAL - [place] MFM OB DETAIL +14 WK; Future - AMB referral to maternal fetal medicine  4. Dysuria Rx: - POCT Urinalysis Dipstick  5. Candida vaginitis Rx: - terconazole (TERAZOL 7) 0.4 % vaginal cream; Place 1 applicator vaginally at bedtime.  Dispense: 45 g; Refill: 0  6. Obesity affecting pregnancy, antepartum Rx: - aspirin 81 MG chewable tablet; Chew 1 tablet (81 mg total) by mouth daily.  Dispense: 30 tablet; Refill: 5  7. Boil Rx: - clindamycin (CLEOCIN T) 1 % external solution; Apply topically 2 (two) times daily.  Dispense: 30 mL; Refill: 2   Preterm labor symptoms and general obstetric precautions including but not limited to vaginal bleeding, contractions, leaking of fluid and fetal movement were reviewed in detail with the patient. Please refer to After Visit Summary for other counseling recommendations.   Return in about 4 weeks (around 04/28/2021) for ROB.   Brock Bad, MD  03-31-2021

## 2021-04-02 ENCOUNTER — Encounter: Payer: Self-pay | Admitting: Obstetrics

## 2021-04-02 LAB — CERVICOVAGINAL ANCILLARY ONLY
Bacterial Vaginitis (gardnerella): NEGATIVE
Candida Glabrata: NEGATIVE
Candida Vaginitis: POSITIVE — AB
Chlamydia: NEGATIVE
Comment: NEGATIVE
Comment: NEGATIVE
Comment: NEGATIVE
Comment: NEGATIVE
Comment: NEGATIVE
Comment: NORMAL
Neisseria Gonorrhea: NEGATIVE
Trichomonas: NEGATIVE

## 2021-04-02 LAB — AFP, SERUM, OPEN SPINA BIFIDA
AFP MoM: 1.07
AFP Value: 34.4 ng/mL
Gest. Age on Collection Date: 16.3 weeks
Maternal Age At EDD: 36.2 yr
OSBR Risk 1 IN: 10000
Test Results:: NEGATIVE
Weight: 215 [lb_av]

## 2021-04-03 ENCOUNTER — Other Ambulatory Visit: Payer: Self-pay | Admitting: Obstetrics

## 2021-04-13 ENCOUNTER — Other Ambulatory Visit: Payer: Self-pay

## 2021-04-13 ENCOUNTER — Ambulatory Visit
Admission: EM | Admit: 2021-04-13 | Discharge: 2021-04-13 | Disposition: A | Discharge status: Home / Self Care | Payer: Medicaid Other

## 2021-04-13 ENCOUNTER — Encounter: Payer: Self-pay | Admitting: Emergency Medicine

## 2021-04-13 DIAGNOSIS — J3089 Other allergic rhinitis: Secondary | ICD-10-CM

## 2021-04-13 NOTE — Discharge Instructions (Signed)
It appears that you are experiencing symptoms of nasal congestion and cough due to mold exposure.  Please follow-up with your OB/GYN as soon as possible to make sure that your baby is okay due to mold exposure as this may affect your baby.  Please take cetirizine and Flonase daily as this is safe with pregnancy and will help alleviate symptoms.  Although, treatment for symptoms related to mold exposure is being removed from mold exposure.  May also follow-up with provided contact information for asthma and allergy specialist for further evaluation and management of mold exposure.

## 2021-04-13 NOTE — ED Triage Notes (Signed)
Coughing, nasal congestion x 1 month. States there is mold in her house or apartment complex and she's concerned for her and her daughter's health. States she needs something from [place] saying she needs to not be exposed to the mold.

## 2021-04-13 NOTE — ED Provider Notes (Signed)
[facility]    CSN: [id number] Arrival date & time: 04/13/21  1100      History   Chief Complaint No chief complaint on file.   HPI Diamond Burton is a 35 y.o. female.   Patient presents with nasal congestion and cough that has been present for approximately 1 month.  Cough is nonproductive per patient.  Patient reports that she recently found mold in her house and believes that her symptoms are a result of this.  Patient is also had some intermittent shortness of breath.  Denies fevers.  Patient has not yet taken any medications to help alleviate symptoms.  Patient reports that she is 4 months pregnant and did not want to take any medications due to this.  Patient has appointment with OB/GYN to check on baby due to mold exposure in a few days.    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   left   History of gonorrhea    Hydradenitis    Infection    UTI   Kidney stones    Ovarian cyst    Periodontal disease    PID (pelvic inflammatory disease)    PTSD (post-traumatic stress disorder)    Trichomonas contact     Patient Active Problem List   Diagnosis Date Noted   Encounter for supervision of normal pregnancy in first trimester 02/03/2021   Indication for care in labor or delivery 04/29/2019   Normal vaginal delivery 04/29/2019   Obstetric labial laceration, delivered, current hospitalization 04/29/2019   Breast abscess 03/19/2019   Group B streptococcal bacteriuria 10/18/2018   Chlamydia infection affecting pregnancy in first trimester 10/10/2018    Gonorrhea affecting pregnancy in first trimester 10/10/2018   Supervision of high-risk pregnancy 10/08/2018   Homicidal ideation 09/11/2018   Suicidal ideation 09/11/2018   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   History of low birth weight infant 11/28/2016   Rh negative state in antepartum period 08/02/2016   Obesity in pregnancy, antepartum 08/02/2016   History of herpes genitalis 06/01/2016   History of maternal syphilis, currently pregnant 06/01/2016   Hydradenitis     Past Surgical History:  Procedure Laterality Date   HYDRADENITIS EXCISION  03/2019   IRRIGATION AND DEBRIDEMENT ABSCESS Left 03/20/2019   Procedure: IRRIGATION AND DEBRIDEMENT BREAST ABSCESS;  Surgeon: Coralie Keens, MD;  Location: [place];  Service: General;  Laterality: Left;   NO PAST SURGERIES      OB History     Gravida  6   Para  4   Term  4   Preterm      AB  1   Living  4      SAB  1   IAB      Ectopic      Multiple  0   Live Births  4            Home Medications    Prior to Admission medications   Medication Sig Start Date End  Date Taking? Authorizing Provider  aspirin 81 MG chewable tablet Chew 1 tablet (81 mg total) by mouth daily. 03/31/21   Shelly Bombard, MD  Blood Pressure Monitoring (BLOOD PRESSURE KIT) DEVI 1 Device by Does not apply route once a week. 03/03/21   Shelly Bombard, MD  clindamycin (CLEOCIN T) 1 % external solution Apply topically 2 (two) times daily. 03/31/21   Shelly Bombard, MD  metroNIDAZOLE (FLAGYL) 500 MG tablet Take 1 tablet (500 mg total) by mouth 2 (two) times daily. 03/03/21   Shelly Bombard, MD  Misc. Devices (GOJJI WEIGHT SCALE) MISC 1 Device by Does not apply route once a week. 03/03/21   Shelly Bombard, MD  Prenatal Vit-Fe Fumarate-FA (PREPLUS) 27-1 MG TABS Take 1 tablet by mouth daily. 01/13/21   Griffin Basil, MD  terconazole (TERAZOL 7) 0.4 % vaginal cream Place 1 applicator vaginally at bedtime. 03/31/21   Shelly Bombard, MD   valACYclovir (VALTREX) 500 MG tablet Take 1 tablet (500 mg total) by mouth 2 (two) times daily. 01/13/21   Griffin Basil, MD    Family History Family History  Problem Relation Age of Onset   Depression Mother    Diabetes Mother    Hypertension Mother    Heart disease Mother    Asthma Sister    Asthma Daughter    Hypertension Maternal Grandmother    Diabetes Maternal Grandmother     Social History Social History   Tobacco Use   Smoking status: Former    Packs/day: 0.50    Years: 2.00    Pack years: 1.00    Types: Cigarettes   Smokeless tobacco: Never  Vaping Use   Vaping Use: Never used  Substance Use Topics   Alcohol use: Not Currently    Comment: not since confirmed pregnancy   Drug use: No     Allergies   Latex   Review of Systems Review of Systems Per HPI  Physical Exam Triage Vital Signs ED Triage Vitals  Enc Vitals Group     BP 04/13/21 1252 122/76     Pulse Rate 04/13/21 1252 92     Resp 04/13/21 1252 16     Temp 04/13/21 1252 98.3 F (36.8 C)     Temp Source 04/13/21 1252 Oral     SpO2 04/13/21 1252 98 %     Weight --      Height --      Head Circumference --      Peak Flow --      Pain Score 04/13/21 1400 0     Pain Loc --      Pain Edu? --      Excl. in [place]? --    No data found.  Updated Vital Signs BP 122/76 (BP Location: Left Arm)   Pulse 92   Temp 98.3 F (36.8 C) (Oral)   Resp 16   LMP 12/06/2020   SpO2 98%   Visual Acuity Right Eye Distance:   Left Eye Distance:   Bilateral Distance:    Right Eye Near:   Left Eye Near:    Bilateral Near:     Physical Exam Constitutional:      General: She is not in acute distress.    Appearance: Normal appearance. She is not toxic-appearing or diaphoretic.  HENT:     Head: Normocephalic and atraumatic.     Right Ear: Tympanic membrane and ear canal normal.     Left Ear: Tympanic membrane  and ear canal normal.     Nose: Congestion present.     Mouth/Throat:     Mouth: Mucous  membranes are moist.     Pharynx: No posterior oropharyngeal erythema.  Eyes:     Extraocular Movements: Extraocular movements intact.     Conjunctiva/sclera: Conjunctivae normal.     Pupils: Pupils are equal, round, and reactive to light.  Cardiovascular:     Rate and Rhythm: Normal rate and regular rhythm.     Pulses: Normal pulses.     Heart sounds: Normal heart sounds.  Pulmonary:     Effort: Pulmonary effort is normal. No respiratory distress.     Breath sounds: Normal breath sounds. No stridor. No wheezing, rhonchi or rales.  Abdominal:     General: Abdomen is flat. Bowel sounds are normal.     Palpations: Abdomen is soft.  Musculoskeletal:        General: Normal range of motion.     Cervical back: Normal range of motion.  Skin:    General: Skin is warm and dry.  Neurological:     General: No focal deficit present.     Mental Status: She is alert and oriented to person, place, and time. Mental status is at baseline.  Psychiatric:        Mood and Affect: Mood normal.        Behavior: Behavior normal.     UC Treatments / Results  Labs (all labs ordered are listed, but only abnormal results are displayed) Labs Reviewed - No data to display  EKG   Radiology No results found.  Procedures Procedures (including critical care time)  Medications Ordered in UC Medications - No data to display  Initial Impression / Assessment and Plan / UC Course  I have reviewed the triage vital signs and the nursing notes.  Pertinent labs & imaging results that were available during my care of the patient were reviewed by me and considered in my medical decision making (see chart for details).     Discussed with patient the risk of mold exposure.  Advised patient that best treatment option is to remove herself from mold exposure.  Patient voiced understanding.  Discussed cetirizine and Flonase treatment with patient and that this is safe with pregnancy typically.  Patient wished to  discussed this with OB/GYN prior to treatment and to get this medication over-the-counter if needed.  No red flags seen on exam.  Patient encouraged to follow-up with OB/GYN as scheduled appointment.  No red flags on exam.  Discussed strict return precautions.  Patient verbalized understanding and was agreeable with plan. 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  Please follow-up with your OB/GYN as soon as possible to make sure that your baby is okay due to mold exposure as this may affect your baby.  Please take cetirizine and Flonase daily as this is safe with pregnancy and will help alleviate symptoms.  Although, treatment for symptoms related to mold exposure is being removed from mold exposure.  May also follow-up with provided contact information for asthma and allergy specialist for further evaluation and management of mold exposure.     ED Prescriptions   None    PDMP not reviewed this encounter.   Teodora Medici, [place] 04/13/21 1416

## 2021-04-14 ENCOUNTER — Encounter: Payer: Medicaid Other | Admitting: Women's Health

## 2021-04-19 ENCOUNTER — Ambulatory Visit (HOSPITAL_BASED_OUTPATIENT_CLINIC_OR_DEPARTMENT_OTHER): Payer: Medicaid Other

## 2021-04-19 ENCOUNTER — Ambulatory Visit: Payer: Medicaid Other

## 2021-04-19 ENCOUNTER — Other Ambulatory Visit: Payer: Self-pay

## 2021-04-19 ENCOUNTER — Ambulatory Visit (HOSPITAL_BASED_OUTPATIENT_CLINIC_OR_DEPARTMENT_OTHER): Payer: Medicaid Other | Admitting: Obstetrics

## 2021-04-19 ENCOUNTER — Ambulatory Visit: Payer: Medicaid Other | Admitting: *Deleted

## 2021-04-19 VITALS — BP 123/65 | HR 102

## 2021-04-19 DIAGNOSIS — Z3481 Encounter for supervision of other normal pregnancy, first trimester: Secondary | ICD-10-CM

## 2021-04-19 DIAGNOSIS — O021 Missed abortion: Secondary | ICD-10-CM

## 2021-04-19 DIAGNOSIS — O09522 Supervision of elderly multigravida, second trimester: Secondary | ICD-10-CM

## 2021-04-19 DIAGNOSIS — O280 Abnormal hematological finding on antenatal screening of mother: Secondary | ICD-10-CM

## 2021-04-19 DIAGNOSIS — O0990 Supervision of high risk pregnancy, unspecified, unspecified trimester: Secondary | ICD-10-CM | POA: Insufficient documentation

## 2021-04-19 DIAGNOSIS — Z3A19 19 weeks gestation of pregnancy: Secondary | ICD-10-CM

## 2021-04-19 NOTE — Progress Notes (Signed)
MFM Note  Diamond Burton was seen for an ultrasound exam and consultation as her cell free DNA test indicated a high risk for monosomy X (Turner syndrome).  Her pregnancy has also been complicated by advanced maternal age.  She reports four prior uncomplicated full-term vaginal deliveries.  Her last pregnancy ended in a first trimester miscarriage.    She denies any other significant past medical history and denies any problems in her current pregnancy.  On today's exam, a fetal demise is noted.  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  The patient was advised that Turner syndrome is associated with cystic hygromas.  Unfortunately, fetuses with cystic hygromas and chromosomal abnormalities often suffer a demise.  She was reassured that there was nothing that she did nor anyone else did to have caused this unfortunate event.  Management options for a fetal demise including an induction of labor and a D&E procedure were discussed.  After all risks versus benefits of the procedures were discussed, the patient stated that she would like to have a D&E performed as soon as possible.  At her current gestational age, we have made a referral for her to have a D&E performed with the physicians at [facility].  The fetal tissue and placenta can be sent after the D&E procedure for definitive diagnosis of a chromosomal abnormality.  The patient was offered and declined a meeting with our genetic counselor today.  She was reassured that many women who have had an adverse outcome in one pregnancy will have a normal outcome in their subsequent pregnancy.  A total of 30 minutes was spent counseling and coordinating the care for this patient.  Greater than 50% of the time was spent in direct face-to-face contact.

## 2021-04-20 ENCOUNTER — Encounter: Payer: Self-pay | Admitting: Obstetrics and Gynecology

## 2021-04-20 ENCOUNTER — Inpatient Hospital Stay (HOSPITAL_COMMUNITY)
Admission: AD | Admit: 2021-04-20 | Discharge: 2021-04-22 | DRG: 807 | Disposition: A | Discharge status: Home / Self Care | Payer: Medicaid Other | Attending: Obstetrics and Gynecology | Admitting: Obstetrics and Gynecology

## 2021-04-20 DIAGNOSIS — Z91040 Latex allergy status: Secondary | ICD-10-CM

## 2021-04-20 DIAGNOSIS — Z3A19 19 weeks gestation of pregnancy: Secondary | ICD-10-CM | POA: Diagnosis not present

## 2021-04-20 DIAGNOSIS — Z3481 Encounter for supervision of other normal pregnancy, first trimester: Secondary | ICD-10-CM

## 2021-04-20 DIAGNOSIS — O26892 Other specified pregnancy related conditions, second trimester: Secondary | ICD-10-CM | POA: Diagnosis present

## 2021-04-20 DIAGNOSIS — O285 Abnormal chromosomal and genetic finding on antenatal screening of mother: Secondary | ICD-10-CM | POA: Diagnosis present

## 2021-04-20 DIAGNOSIS — Z7982 Long term (current) use of aspirin: Secondary | ICD-10-CM | POA: Diagnosis not present

## 2021-04-20 DIAGNOSIS — Z20822 Contact with and (suspected) exposure to covid-19: Secondary | ICD-10-CM | POA: Diagnosis present

## 2021-04-20 DIAGNOSIS — O021 Missed abortion: Principal | ICD-10-CM | POA: Diagnosis present

## 2021-04-20 DIAGNOSIS — Z87891 Personal history of nicotine dependence: Secondary | ICD-10-CM | POA: Diagnosis not present

## 2021-04-20 DIAGNOSIS — Z6791 Unspecified blood type, Rh negative: Secondary | ICD-10-CM

## 2021-04-20 DIAGNOSIS — O358XX0 Maternal care for other (suspected) fetal abnormality and damage, not applicable or unspecified: Secondary | ICD-10-CM | POA: Diagnosis present

## 2021-04-20 DIAGNOSIS — O3519X0 Maternal care for (suspected) chromosomal abnormality in fetus, other chromosomal abnormality, not applicable or unspecified: Secondary | ICD-10-CM | POA: Diagnosis not present

## 2021-04-20 LAB — COMPREHENSIVE METABOLIC PANEL
ALT: 16 U/L (ref 0–44)
AST: 17 U/L (ref 15–41)
Albumin: 2.8 g/dL — ABNORMAL LOW (ref 3.5–5.0)
Alkaline Phosphatase: 92 U/L (ref 38–126)
Anion gap: 10 (ref 5–15)
BUN: 7 mg/dL (ref 6–20)
CO2: 19 mmol/L — ABNORMAL LOW (ref 22–32)
Calcium: 9 mg/dL (ref 8.9–10.3)
Chloride: 103 mmol/L (ref 98–111)
Creatinine, Ser: 0.57 mg/dL (ref 0.44–1.00)
GFR, Estimated: >60 mL/min (ref >60)
Glucose, Bld: 94 mg/dL (ref 70–99)
Potassium: 3.6 mmol/L (ref 3.5–5.1)
Sodium: 132 mmol/L — ABNORMAL LOW (ref 135–145)
Total Bilirubin: 0.3 mg/dL (ref 0.3–1.2)
Total Protein: 6.7 g/dL (ref 6.5–8.1)

## 2021-04-20 LAB — CBC
HCT: 35.5 % — ABNORMAL LOW (ref 36.0–46.0)
Hemoglobin: 12.1 g/dL (ref 12.0–15.0)
MCH: 30 pg (ref 26.0–34.0)
MCHC: 34.1 g/dL (ref 30.0–36.0)
MCV: 87.9 fL (ref 80.0–100.0)
Platelets: 303 10*3/uL (ref 150–400)
RBC: 4.04 MIL/uL (ref 3.87–5.11)
RDW: 13.2 % (ref 11.5–15.5)
WBC: 8.6 10*3/uL (ref 4.0–10.5)
nRBC: 0 % (ref 0.0–0.2)

## 2021-04-20 LAB — WET PREP, GENITAL
Clue Cells Wet Prep HPF POC: NONE SEEN
Sperm: NONE SEEN
Trich, Wet Prep: NONE SEEN
Yeast Wet Prep HPF POC: NONE SEEN

## 2021-04-20 LAB — RESP PANEL BY RT-PCR (FLU A&B, COVID) ARPGX2
Influenza A by PCR: NEGATIVE
Influenza B by PCR: NEGATIVE
SARS Coronavirus 2 by RT PCR: NEGATIVE

## 2021-04-20 LAB — KLEIHAUER-BETKE STAIN
# Vials RhIg: 1
Fetal Cells %: 0 %
Quantitation Fetal Hemoglobin: 0 mL

## 2021-04-20 LAB — TSH: TSH: 2.844 u[IU]/mL (ref 0.350–4.500)

## 2021-04-20 LAB — PROTIME-INR
INR: 1 (ref 0.8–1.2)
Prothrombin Time: 13.4 seconds (ref 11.4–15.2)

## 2021-04-20 LAB — TYPE AND SCREEN
ABO/RH(D): B NEG
Antibody Screen: NEGATIVE

## 2021-04-20 LAB — HEMOGLOBIN A1C
Hgb A1c MFr Bld: 5.4 % (ref 4.8–5.6)
Mean Plasma Glucose: 108.28 mg/dL

## 2021-04-20 LAB — APTT: aPTT: 25 seconds (ref 24–36)

## 2021-04-20 LAB — HIV ANTIBODY (ROUTINE TESTING W REFLEX): HIV Screen 4th Generation wRfx: NONREACTIVE

## 2021-04-20 LAB — FIBRINOGEN: Fibrinogen: 562 mg/dL — ABNORMAL HIGH (ref 210–475)

## 2021-04-20 MED ORDER — ACETAMINOPHEN 325 MG PO TABS
650.0000 mg | ORAL_TABLET | ORAL | Status: DC | PRN
  Administered 2021-04-20 – 2021-04-22 (×3): 650 mg via ORAL
  Filled 2021-04-20 (×3): qty 2

## 2021-04-20 MED ORDER — SOD CITRATE-CITRIC ACID 500-334 MG/5ML PO SOLN: 30.0000 mL | ORAL | Status: DC | PRN

## 2021-04-20 MED ORDER — OXYTOCIN BOLUS FROM INFUSION: 333.0000 mL | Freq: Once | INTRAVENOUS | Status: DC

## 2021-04-20 MED ORDER — ONDANSETRON HCL 4 MG/2ML IJ SOLN: 4.0000 mg | Freq: Four times a day (QID) | INTRAMUSCULAR | Status: DC | PRN

## 2021-04-20 MED ORDER — LIDOCAINE HCL (PF) 1 % IJ SOLN: 30.0000 mL | INTRAMUSCULAR | Status: DC | PRN

## 2021-04-20 MED ORDER — LACTATED RINGERS IV SOLN: INTRAVENOUS | Status: DC

## 2021-04-20 MED ORDER — MISOPROSTOL 200 MCG PO TABS
400.0000 ug | ORAL_TABLET | ORAL | Status: AC | PRN
  Administered 2021-04-20 – 2021-04-21 (×4): 400 ug via BUCCAL
  Filled 2021-04-20 (×4): qty 2

## 2021-04-20 MED ORDER — OXYTOCIN-SODIUM CHLORIDE 30-0.9 UT/500ML-% IV SOLN: 2.5000 [IU]/h | INTRAVENOUS | Status: DC

## 2021-04-20 MED ORDER — RHO D IMMUNE GLOBULIN 1500 UNIT/2ML IJ SOSY
300.0000 ug | PREFILLED_SYRINGE | Freq: Once | INTRAMUSCULAR | Status: DC
  Filled 2021-04-20: qty 2

## 2021-04-20 MED ORDER — LACTATED RINGERS IV SOLN: 500.0000 mL | INTRAVENOUS | Status: DC | PRN

## 2021-04-20 MED ORDER — FENTANYL CITRATE (PF) 100 MCG/2ML IJ SOLN
50.0000 ug | INTRAMUSCULAR | Status: DC | PRN
  Administered 2021-04-20 (×2): 50 ug via INTRAVENOUS
  Administered 2021-04-21 (×2): 100 ug via INTRAVENOUS
  Administered 2021-04-21: 50 ug via INTRAVENOUS
  Administered 2021-04-21 – 2021-04-22 (×4): 100 ug via INTRAVENOUS
  Filled 2021-04-20 (×9): qty 2

## 2021-04-20 NOTE — Progress Notes (Signed)
Initial visit with Diamond Burton to introduce spiritual care and offer support during her hospitalization to deliver her daughter. Chaplain asked open ended questions to facilitate story telling and emotional expression and engaged in therapeutic listening as Diamond Burton shared about learning that her daughter had died at her ultrasound appointment today. Diamond Burton shared that she feels worried and agrieved. She is worried about what it will be like to deliver "a dead baby" and sad about her loss. She has four living children and had a previous loss earlier in gestation about 2 years ago.  Chaplain normalzed the variety of emotions present in the loss of a baby and subsequent delivery and encouraged Diamond Burton to remain present with her feelings, aware that whatever she feels in the moment is correct. Chaplain provided education about resources available during her stay including the availability of a chaplain around the clock.  Please page the on-call chaplain if pt has an after hours need.  Will continue to follow.  Please page as further needs arise.  Diamond Burton. Diamond Burton, M.Div. [facility] Chaplain Pager [phone] Office [phone]       04/20/21 1515  Clinical Encounter Type  Visited With Patient  Visit Type Initial;Spiritual support;Death  Spiritual Encounters  Spiritual Needs Emotional;Grief support  Stress Factors  Patient Stress Factors Loss

## 2021-04-20 NOTE — H&P (Signed)
Obstetrics Admission History & Physical  04/20/2021 - 1:18 PM Primary OBGYN: [facility]  Chief Complaint: 19wk IUFD  History of Present Illness  35 y.o. [id number] @ 41w2dwith the above CC. 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 went to mfm anatomy u/s yesterday and was diagnosed with IUFD with fetus meausring 17/5wks, normal AFI, transverse and no previa; her OB visit was on 10/19 with normal FHTs  She saw MFM Dr. FAnnamaria Bootsand pt [facility] because she states that she wanted to have the baby out now. She states her phone stopped working last night so she's not sure if [facility] has tried to contact her. She denies any fevers, chills, pain, VB  Review of Systems: as noted in the History of Present Illness.  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   left   History of gonorrhea    Hydradenitis    Infection    UTI   Kidney stones    Ovarian cyst    Periodontal disease    PID (pelvic inflammatory disease)    PTSD (post-traumatic stress disorder)    Trichomonas contact    PSHx:  Past Surgical History:  Procedure Laterality Date   HYDRADENITIS EXCISION  03/2019   IRRIGATION AND DEBRIDEMENT ABSCESS Left 03/20/2019   Procedure: IRRIGATION AND DEBRIDEMENT BREAST ABSCESS;  Surgeon: BCoralie Keens MD;  [place] [facility];  Service: General;  Laterality: Left;   NO PAST SURGERIES     Medications:  Medications Prior to Admission  Medication Sig Dispense Refill Last Dose   aspirin 81 MG chewable tablet Chew 1 tablet (81 mg total) by mouth daily. 30 tablet 5    Blood Pressure Monitoring (BLOOD PRESSURE KIT) DEVI 1 Device by Does not apply route once a week. (Patient not taking: Reported on 04/19/2021) 1 each 0    clindamycin (CLEOCIN T) 1 % external solution Apply topically 2 (two) times daily. (Patient not taking: Reported on 04/19/2021) 30 mL 2  metroNIDAZOLE (FLAGYL) 500 MG tablet Take 1 tablet (500 mg total) by mouth 2 (two) times daily. (Patient not taking: Reported on 04/19/2021) 14 tablet 2    Misc. Devices (GOJJI WEIGHT SCALE) MISC 1 Device by Does not apply route once a week. (Patient not taking: Reported on 04/19/2021) 1 each 0    Prenatal Vit-Fe Fumarate-FA (PREPLUS) 27-1 MG TABS Take 1 tablet by mouth daily. 30 tablet 13    terconazole (TERAZOL 7) 0.4 % vaginal cream Place 1 applicator vaginally at bedtime. (Patient not taking: Reported on 04/19/2021) 45 g 0    valACYclovir (VALTREX) 500 MG tablet Take 1 tablet (500 mg total) by mouth 2 (two) times daily. 60 tablet 6      Allergies: is allergic to latex. OBHx:  OB History  Gravida  Para Term Preterm AB Living  _0 SAB IAB Ectopic Multiple Live Births  1     0 4    # Outcome Date GA Lbr Len/2nd Weight Sex Delivery Anes PTL Lv  6 Current           5 Term 04/29/19 45w3d12:20 / 00:15 3640 g M Vag-Spont EPI  LIV  4 SAB 07/2018          3 Term 11/29/16 357w6d9:26 / 00:25 2580 g M Vag-Vacuum EPI  LIV  2 Term 06/28/11 4054w4d:32 / 00:21 3205 g F Vag-Spont EPI  LIV     Birth Comments: WNL  1 Term 2005 40w66w0d48 g F Vag-Spont   LIV          FHx:  Family History  Problem Relation Age of Onset   Depression Mother    Diabetes Mother    Hypertension Mother    Heart disease Mother    Asthma Sister    Asthma Daughter    Hypertension Maternal Grandmother    Diabetes Maternal Grandmother    Soc Hx:  Social History   Socioeconomic History   Marital status: Single    Spouse name: Not on file   Number of children: 3   Years of education: Not on file   Highest education level: Not on file  Occupational History   Not on file  Tobacco Use   Smoking status: Former    Packs/day: 0.50    Years: 2.00    Pack years: 1.00    Types: Cigarettes   Smokeless tobacco: Never  Vaping Use   Vaping Use: Never used  Substance and Sexual Activity   Alcohol use: Not Currently    Comment: not since confirmed pregnancy   Drug use: No   Sexual activity: Yes    Partners: Male    Birth control/protection: None  Other Topics Concern   Not on file  Social History Narrative   Lives with family   Right handed    Caffeine use: tea daily   Social Determinants of Health   Financial Resource Strain: Not on file  Food Insecurity: Not on file  Transportation Needs: Not on file  Physical Activity: Not on file  Stress: Not on file  Social Connections: Not on file  Intimate Partner Violence: Not on file    Objective    Current Vital Signs 24h Vital Sign Ranges  T 98.1 F (36.7 C) Temp  Avg: 98.1 F (36.7 C)  Min: 98.1 F (36.7 C)  Max: 98.1 F (36.7 C)  BP  122/61 BP  Min: 122/61  Max: 122/61  HR  93 Pulse  Avg: 93  Min: 93  Max: 93  RR 18 Resp  Avg: 18  Min: 18  Max: 18  SaO2 100 %   SpO2  Avg: 99.5 %  Min: 99 %  Max: 100 %       24 Hour I/O Current Shift I/O  Time Ins Outs No intake/output data recorded. No intake/output data recorded.    General: Well nourished, well developed female in no acute distress.  Skin:  Warm and dry.  Cardiovascular: S1, S2 normal, no murmur, rub or gallop, regular rate and rhythm Respiratory:  Clear to auscultation bilateral. Normal respiratory effort Abdomen: obese, nttp Neuro/Psych:  Normal mood and affect.   Labs  No new labs  Radiology No new imaging Narrative & Impression  ----------------------------------------------------------------------  OBSTETRICS REPORT                       (Signed Final 04/19/2021 03:12 pm) ---------------------------------------------------------------------- Patient Info  ID #:       [id number]                          D.O.B.:  Aug 15, 1985 (34 yrs)  Name:       SAHIRAH RUDELL                 Visit Date: 04/19/2021 01:02 pm ---------------------------------------------------------------------- Performed By  Attending:        Johnell Comings MD         Ref. Address:     [place], [place]  Performed By:     Maryfrances Bunnell      [facility]  Referred By:      Woodroe Mode                    MD ---------------------------------------------------------------------- Orders  #  Description                           Code        Ordered  By  1  [place] MFM OB DETAIL +14 WK               76811.01    Baltazar Najjar ----------------------------------------------------------------------  #  Order #                     Accession #                Episode #  1  [id number]                   [id number]                 [id number] ---------------------------------------------------------------------- Indications  Abnormal chromosomal and genetic finding       O28.5  on antenatal screening of mother (increased  risk for monosomy X)  Obesity complicating pregnancy, second         O99.212  trimester  Advanced maternal age multigravida 62+,        O40.522  second trimester  [redacted] weeks gestation of pregnancy                Z3A.19  Encounter for antenatal screening for          Z36.3  malformations  Negative Horizon/AFP ---------------------------------------------------------------------- Fetal Evaluation  Num Of Fetuses:         1  Cardiac Activity:       Not visualized  Presentation:           Transverse, head to maternal right  Placenta:               Anterior  Amniotic Fluid  AFI FV:      Within normal limits ---------------------------------------------------------------------- Biometry  BPD:      37.7  mm     G. Age:  17w 4d        2.8  %    CI:        78.28   %    70 - 86                                                          FL/HC:      15.6   %    16.1 - 18.3  HC:      134.8  mm     G. Age:  17w 0d        < 1  %    HC/AC:      0.92  1.09 - 1.39  AC:      146.3  mm     G. Age:  19w 6d         72  %    FL/BPD:     55.7   %  FL:         21  mm     G. Age:  16w 2d        < 1  %    FL/AC:      14.4   %    20 - 24  Est. FW:     222  gm      0 lb 8 oz      5  % ---------------------------------------------------------------------- Gestational Age  U/S Today:     17w 5d                                        EDD:   09/22/21  Best:          19w 1d     Det. By:  U/S C R L  (02/03/21)    EDD:    09/12/21 ---------------------------------------------------------------------- Anatomy  Nuchal Fold:           Cystic hygroma ---------------------------------------------------------------------- Cervix Uterus Adnexa  Cervix  Length:           4.18  cm.  Normal appearance by transabdominal scan.  Uterus  No abnormality visualized.  Right Ovary  Within normal limits.  Left Ovary  Within normal limits.  Adnexa  No adnexal mass visualized. ---------------------------------------------------------------------- Comments  Adalina Seeman was seen for an ultrasound exam and  consultation as her cell free DNA test indicated a high risk for  monosomy X (Turner syndrome).  Her pregnancy has also  been complicated by advanced maternal age.  She reports  four prior uncomplicated full-term vaginal deliveries.  Her last  pregnancy ended in a first trimester miscarriage.  She denies any other significant past medical history and  denies any problems in her current pregnancy.  On today's exam, a fetal demise is noted.  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  The patient was advised that Turner syndrome is associated  with cystic hygromas.  Unfortunately, fetuses with cystic  hygromas and chromosomal abnormalities often suffer a  demise.  She was reassured that there was nothing that she  did nor anyone else did to have caused this unfortunate event.  Management options for a fetal demise including an induction  of labor and a D&E procedure were discussed.  After all risks  versus benefits of the procedures were discussed, the patient  stated that she would like to have a D&E performed as soon  as possible.  At her current gestational age, we have made a referral for  her to have a D&E performed with the physicians at [facility].  The fetal tissue and placenta can be sent  after the D&E procedure for definitive diagnosis of a  chromosomal abnormality.  The patient was offered and  declined a meeting with our  genetic counselor today.  She was reassured that many women who have had an  adverse outcome in one pregnancy will have a normal  outcome in their subsequent pregnancy.  A total of 30 minutes was spent counseling and coordinating  the care for this patient.  Greater than 50% of the time was  spent in direct face-to-face contact. ----------------------------------------------------------------------                   Johnell Comings, MD Electronically Signed Final Report   04/19/2021 03:12 pm    Assessment & Plan   35 y.o. [id number] @ 32w2dwith IUFD; pt stable *I d/w her re: the two D&E process, which is not performed here, and cytotec IOL which can be prolonged and also has about a 20% risk of needing a D&C for retained placenta. Patient desires to stay here and proceed with cytotec IOL. Basic labs ordered. Will do cyotec 4059m buccally q3-4h  ChDurene RomansD Attending Center for [facility] Phone: [phone]-F, [phone]) & [phone] hours, weekends, holidays)

## 2021-04-20 NOTE — MAU Note (Signed)
Was dx with IUFD yesterday, uncertain of plan. "Knows it has to come out".  States started cramping this morning, comes and goes, mild. No bleeding or d/c. Feels dizzy at times.

## 2021-04-20 NOTE — Plan of Care (Signed)
  Problem: Education: Goal: Knowledge of General Education information will improve Description: Including pain rating scale, medication(s)/side effects and non-pharmacologic comfort measures Outcome: Completed/Met

## 2021-04-20 NOTE — Progress Notes (Signed)
Patient arrived at [facility] registration and reports that she is here for a procedure. On chart review patient has had an IUFD that was diagnosed on 04/19/2021. I called Dr. Parke Poisson, and he confirmed he saw the patient and made the diagnosis. He states that at that time he offered her IOL here at [facility], but patient declined as she wanted it to be fast and she did not want to see the fetus. So, MFM spoke with Dr. Hyacinth Meeker at [facility] and they were to call the patient today. I do not see any notes in care everywhere that the patient has been called at this time.   Patient is upset and crying today. She reports that she "just can't wait". Spoke with Dr. Vergie Living and he will come see the patient and discuss options with her.    Thressa Sheller DNP, CNM  04/20/21  11:53 AM

## 2021-04-21 DIAGNOSIS — O285 Abnormal chromosomal and genetic finding on antenatal screening of mother: Secondary | ICD-10-CM | POA: Diagnosis present

## 2021-04-21 LAB — GC/CHLAMYDIA PROBE AMP (~~LOC~~) NOT AT ARMC
Chlamydia: NEGATIVE
Comment: NEGATIVE
Comment: NORMAL
Neisseria Gonorrhea: NEGATIVE

## 2021-04-21 LAB — RPR: RPR Ser Ql: NONREACTIVE

## 2021-04-21 MED ORDER — SODIUM CHLORIDE 0.9 % IV SOLN
25.0000 mg | Freq: Once | INTRAVENOUS | Status: AC
  Administered 2021-04-21: 25 mg via INTRAVENOUS
  Filled 2021-04-21: qty 1

## 2021-04-21 MED ORDER — MISOPROSTOL 200 MCG PO TABS
400.0000 ug | ORAL_TABLET | Freq: Once | ORAL | Status: AC
  Administered 2021-04-21: 400 ug via VAGINAL

## 2021-04-21 MED ORDER — MISOPROSTOL 200 MCG PO TABS
400.0000 ug | ORAL_TABLET | ORAL | Status: DC
  Administered 2021-04-21 – 2021-04-22 (×2): 400 ug via VAGINAL
  Filled 2021-04-21 (×2): qty 2

## 2021-04-21 MED ORDER — MISOPROSTOL 200 MCG PO TABS
400.0000 ug | ORAL_TABLET | Freq: Once | ORAL | Status: AC
  Administered 2021-04-21: 400 ug via VAGINAL
  Filled 2021-04-21: qty 2

## 2021-04-21 MED ORDER — MISOPROSTOL 200 MCG PO TABS
800.0000 ug | ORAL_TABLET | ORAL | Status: AC
  Administered 2021-04-21: 800 ug via BUCCAL
  Filled 2021-04-21 (×2): qty 4

## 2021-04-21 NOTE — Progress Notes (Signed)
Antepartum Note  04/21/2021 - 9:18 AM  35 y.o. [id number] 19 weeks. 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 is admitted for delivery for 19wk IUFD c/b monoxomy x   Subjective:  Feeling a little more pain and pressure, no VB. Pt just got a dose of IV fentanyl Objective:   Vitals:   04/20/21 2353 04/21/21 0337 04/21/21 0341 04/21/21 0846  BP: (!) 112/49 (!) 107/51 (!) 107/51 (!) 116/56  Pulse: 78 73 75 79  Resp:  20  18  Temp: 98.1 F (36.7 C) 98.4 F (36.9 C)  (!) 97.3 F (36.3 C)  TempSrc: Oral Oral  Oral  SpO2:  98% 98% 97%  Weight:      Height:        Current Vital Signs 24h Vital Sign Ranges  T (!) 97.3 F (36.3 C)  Temp  Avg: 98.1 F (36.7 C)  Min: 97.3 F (36.3 C)  Max: 98.4 F (36.9 C)  BP (!) 116/56  BP  Min: 103/57  Max: 122/61  HR 79 Pulse  Avg: 81.1  Min: 73  Max: 93  RR 18 Resp  Avg: 18  Min: 17  Max: 20  SaO2 97 % Room Air SpO2  Avg: 98.7 %  Min: 97 %  Max: 100 %       24 Hour I/O Current Shift I/O  Time Ins Outs 11/08 0701 - 11/09 0700 In: -  Out: 400 [Urine:200] No intake/output data recorded.   Gen: NAD SVE: 1cm/80%  Labs:  Recent Labs  Lab 04/20/21 1719  WBC 8.6  HGB 12.1  HCT 35.5*  PLT 303   Recent Labs  Lab 04/20/21 1719  NA 132*  K 3.6  CL 103  CO2 19*  BUN 7   CREATININE 0.57  CALCIUM 9.0  PROT 6.7  BILITOT 0.3  ALKPHOS 92  ALT 16  AST 17  GLUCOSE 94    Medications Current Facility-Administered Medications  Medication Dose Route Frequency Provider Last Rate Last Admin   acetaminophen (TYLENOL) tablet 650 mg  650 mg Oral Q4H PRN Aletha Halim, MD   650 mg at 04/21/21 0850   fentaNYL (SUBLIMAZE) injection 50-100 mcg  50-100 mcg Intravenous Q1H PRN Aletha Halim, MD   50 mcg at 04/21/21 0848   lactated ringers infusion 500-1,000 mL  500-1,000 mL Intravenous PRN Aletha Halim, MD       lactated ringers infusion   Intravenous Continuous Aletha Halim, MD 125 mL/hr at 04/21/21 0644 New Bag at 04/21/21 0644   lidocaine (PF) (XYLOCAINE) 1 % injection 30 mL  30 mL Subcutaneous PRN Aletha Halim, MD       misoprostol (CYTOTEC) tablet 400 mcg  400 mcg Vaginal Once Aletha Halim, MD       misoprostol (CYTOTEC) tablet 800 mcg  800 mcg Buccal Q4H  Constant, Peggy, MD   800 mcg at 04/21/21 0645   ondansetron (ZOFRAN) injection 4 mg  4 mg Intravenous Q6H PRN [place] Bing, MD       oxytocin (PITOCIN) IV BOLUS FROM BAG  333 mL Intravenous Once [place] Bing, MD       oxytocin (PITOCIN) IV infusion 30 units in NS 500 mL - Premix  2.5 Units/hr Intravenous Continuous [place] Bing, MD       sodium citrate-citric acid (ORACIT) solution 30 mL  30 mL Oral Q2H PRN [place] Bing, MD        Assessment & Plan:  Pt stable *IUFD: will do vaginally when next due around 11am today. Admit labs normal. Gc/ct pending s/p cyotec>#1 11/8 @ 1500 buccal, #2 1930 buccal, #3 midnight, #4 11/9 buccal 0300, #5 0645 buccal *Rh neg: rhig pp *Analgesia: no issues  Cornelia Copa MD Attending Center for [facility])

## 2021-04-21 NOTE — Progress Notes (Signed)
OB Note 1212pm  Cervix unchanged @ 1/80%. Cytotec #6 (1st one placed vaginally) placed,  Cornelia Copa MD Attending Center for [employer] (Faculty Practice) 04/21/2021

## 2021-04-22 ENCOUNTER — Other Ambulatory Visit: Payer: Self-pay | Admitting: Obstetrics and Gynecology

## 2021-04-22 MED ORDER — ONDANSETRON HCL 4 MG PO TABS: 4.0000 mg | ORAL_TABLET | ORAL | Status: DC | PRN

## 2021-04-22 MED ORDER — DIPHENHYDRAMINE HCL 25 MG PO CAPS
25.0000 mg | ORAL_CAPSULE | Freq: Four times a day (QID) | ORAL | Status: DC | PRN
  Administered 2021-04-22: 25 mg via ORAL
  Filled 2021-04-22: qty 1

## 2021-04-22 MED ORDER — OXYCODONE HCL 5 MG PO TABS: 5.0000 mg | ORAL_TABLET | Freq: Four times a day (QID) | ORAL | Status: DC | PRN

## 2021-04-22 MED ORDER — METHYLERGONOVINE MALEATE 0.2 MG/ML IJ SOLN
INTRAMUSCULAR | Status: AC
  Administered 2021-04-22: 0.2 mg
  Filled 2021-04-22: qty 1

## 2021-04-22 MED ORDER — METHYLERGONOVINE MALEATE 0.2 MG/ML IJ SOLN: 0.2000 mg | Freq: Once | INTRAMUSCULAR | Status: AC

## 2021-04-22 MED ORDER — ACETAMINOPHEN 325 MG PO TABS: 650.0000 mg | ORAL_TABLET | ORAL | Status: DC | PRN

## 2021-04-22 MED ORDER — BENZOCAINE-MENTHOL 20-0.5 % EX AERO: 1.0000 "application " | INHALATION_SPRAY | CUTANEOUS | Status: DC | PRN

## 2021-04-22 MED ORDER — HYDROMORPHONE HCL 1 MG/ML IJ SOLN
2.0000 mg | Freq: Once | INTRAMUSCULAR | Status: AC
  Administered 2021-04-22: 2 mg via INTRAVENOUS
  Filled 2021-04-22: qty 2

## 2021-04-22 MED ORDER — WITCH HAZEL-GLYCERIN EX PADS: 1.0000 "application " | MEDICATED_PAD | CUTANEOUS | Status: DC | PRN

## 2021-04-22 MED ORDER — MISOPROSTOL 200 MCG PO TABS: 800.0000 ug | ORAL_TABLET | Freq: Once | ORAL | Status: DC

## 2021-04-22 MED ORDER — OXYTOCIN-SODIUM CHLORIDE 30-0.9 UT/500ML-% IV SOLN: 2.5000 [IU]/h | INTRAVENOUS | Status: DC | PRN

## 2021-04-22 MED ORDER — RHO D IMMUNE GLOBULIN 1500 UNIT/2ML IJ SOSY
300.0000 ug | PREFILLED_SYRINGE | Freq: Once | INTRAMUSCULAR | Status: AC
  Administered 2021-04-22: 300 ug via INTRAVENOUS
  Filled 2021-04-22: qty 2

## 2021-04-22 MED ORDER — SIMETHICONE 80 MG PO CHEW: 80.0000 mg | CHEWABLE_TABLET | ORAL | Status: DC | PRN

## 2021-04-22 MED ORDER — IBUPROFEN 600 MG PO TABS: 600.0000 mg | ORAL_TABLET | Freq: Four times a day (QID) | ORAL | 0 refills | Status: DC | PRN

## 2021-04-22 MED ORDER — COCONUT OIL OIL: 1.0000 "application " | TOPICAL_OIL | Status: DC | PRN

## 2021-04-22 MED ORDER — ONDANSETRON HCL 4 MG/2ML IJ SOLN: 4.0000 mg | INTRAMUSCULAR | Status: DC | PRN

## 2021-04-22 MED ORDER — NALBUPHINE HCL 10 MG/ML IJ SOLN
2.5000 mg | Freq: Once | INTRAMUSCULAR | Status: AC
  Administered 2021-04-22: 2.5 mg via INTRAVENOUS
  Filled 2021-04-22: qty 1

## 2021-04-22 MED ORDER — IBUPROFEN 600 MG PO TABS
600.0000 mg | ORAL_TABLET | Freq: Four times a day (QID) | ORAL | Status: DC
  Administered 2021-04-22: 600 mg via ORAL
  Filled 2021-04-22: qty 1

## 2021-04-22 MED ORDER — DIBUCAINE (PERIANAL) 1 % EX OINT: 1.0000 "application " | TOPICAL_OINTMENT | CUTANEOUS | Status: DC | PRN

## 2021-04-22 MED ORDER — PRENATAL MULTIVITAMIN CH
1.0000 | ORAL_TABLET | Freq: Every day | ORAL | Status: DC
  Administered 2021-04-22: 1 via ORAL
  Filled 2021-04-22: qty 1

## 2021-04-22 NOTE — Progress Notes (Signed)
Fetus weighed 414 grams and was 9 inches long.

## 2021-04-22 NOTE — Discharge Summary (Signed)
Physician Discharge Summary  Patient ID: Diamond Burton MRN: [id number] DOB/AGE: 03/04/1986 35 y.o.  Primary OBGYN: [facility]  Admit date: 04/20/2021 Discharge date: 04/22/2021  Admission Diagnoses: Pregnancy at 20/0 weeks. IUFD. Fetal monosomy X on cell free fetal dna. AMA. Rh neg. BMI 30s  Discharge Diagnoses: Delivered  Discharged Condition: good  Hospital Course: 35 y/o G6P4014 diagnosed with IUFD on 11/7 at her MFM anatomy u/s. They talked to her about options and she elected for a D&E at [facility] [place] later on in the week. The patient presented to OB triage on the day of admission stating she wanted the baby out now. It was d/w her that D&Es are not performed here and if she wanted management it would have to be via medications and vaginal IOL; pt elected for this. She had an uncomplicated cytotec IOL and delivery with delivery on the day of discharge. She was doing well post delivery and desired d/c to home and was discharged on 11/10. She received rhogam prior to discharge.   Significant Diagnostic Studies: negative KB, coags and fibrinogen, a1c, hiv, tsh, cmp, rpr, cbc, wet prep, gc/ct and covid  Discharge Exam: Blood pressure 131/61, pulse 67, temperature 98.2 F (36.8 C), temperature source Oral, resp. rate 18, height 5' 6" (1.676 m), weight 97.5 kg, last menstrual period 12/06/2020, SpO2 97 %, unknown if currently breastfeeding. NAD Abdomen: soft, nttp, nd Ext: no c/c/e Skin: warm and dry  Disposition: Discharge disposition: 01-Home or Self Care       Discharge Instructions     Discharge patient   Complete by: As directed    Discharge disposition: 01-Home or Self Care   Discharge patient date: 04/22/2021      Allergies as of 04/22/2021       Reactions   Latex         Medication List     STOP taking these medications    aspirin 81 MG chewable tablet   Blood Pressure Kit Devi   clindamycin 1 % external solution Commonly known as: CLEOCIN T   Gojji  Weight Scale Misc   metroNIDAZOLE 500 MG tablet Commonly known as: FLAGYL   PrePLUS 27-1 MG Tabs   terconazole 0.4 % vaginal cream Commonly known as: TERAZOL 7   valACYclovir 500 MG tablet Commonly known as: VALTREX       TAKE these medications    acetaminophen 325 MG tablet Commonly known as: Tylenol Take 2 tablets (650 mg total) by mouth every 4 (four) hours as needed (for pain scale < 4).   ibuprofen 600 MG tablet Commonly known as: ADVIL Take 1 tablet (600 mg total) by mouth every 6 (six) hours as needed.        Follow-up Information     [facility]. Go in 1 week(s).   Why: Call the office on monday if you haven't heard about this appointment yet Contact information: [address] [place] [address] [phone]               11/21 mood check scheduled with Andrea Figueroa at 1030am  Signed:   04/22/2021, 2:16 PM   

## 2021-04-22 NOTE — Discharge Instructions (Signed)
Vaginal Delivery, Care After Refer to this sheet in the next few weeks. These discharge instructions provide you with information on caring for yourself after delivery. Your caregiver may also give you specific instructions. Your treatment has been planned according to the most current medical practices available, but problems sometimes occur. Call your caregiver if you have any problems or questions after you go home. HOME CARE INSTRUCTIONS Take over-the-counter or prescription medicines only as directed by your caregiver or pharmacist. Do not drink alcohol, especially if you are breastfeeding or taking medicine to relieve pain. Do not smoke tobacco. Continue to use good perineal care. Good perineal care includes: Wiping your perineum from back to front Keeping your perineum clean. You can do sitz baths twice a day, to help keep this area clean Do not use tampons, douche or have sex until your caregiver says it is okay. Shower only and avoid sitting in submerged water, aside from sitz baths Wear a well-fitting bra that provides breast support. Eat healthy foods. Drink enough fluids to keep your urine clear or pale yellow. Eat high-fiber foods such as whole grain cereals and breads, brown rice, beans, and fresh fruits and vegetables every day. These foods may help prevent or relieve constipation. Avoid constipation with high fiber foods or medications, such as miralax or metamucil Follow your caregiver's recommendations regarding resumption of activities such as climbing stairs, driving, lifting, exercising, or traveling. Talk to your caregiver about resuming sexual activities. Resumption of sexual activities is dependent upon your risk of infection, your rate of healing, and your comfort and desire to resume sexual activity. Try to have someone help you with your household activities and your newborn for at least a few days after you leave the hospital. Rest as much as possible. Try to rest or  take a nap when your newborn is sleeping. Increase your activities gradually. Keep all of your scheduled postpartum appointments. It is very important to keep your scheduled follow-up appointments. At these appointments, your caregiver will be checking to make sure that you are healing physically and emotionally. SEEK MEDICAL CARE IF:  You are passing large clots from your vagina. Save any clots to show your caregiver. You have a foul smelling discharge from your vagina. You have trouble urinating. You are urinating frequently. You have pain when you urinate. You have a change in your bowel movements. You have increasing redness, pain, or swelling near your vaginal incision (episiotomy) or vaginal tear. You have pus draining from your episiotomy or vaginal tear. Your episiotomy or vaginal tear is separating. You have painful, hard, or reddened breasts. You have a severe headache. You have blurred vision or see spots. You feel sad or depressed. You have thoughts of hurting yourself or your newborn. You have questions about your care, the care of your newborn, or medicines. You are dizzy or light-headed. You have a rash. You have nausea or vomiting. You were breastfeeding and have not had a menstrual period within 12 weeks after you stopped breastfeeding. You are not breastfeeding and have not had a menstrual period by the 12th week after delivery. You have a fever. SEEK IMMEDIATE MEDICAL CARE IF:  You have persistent pain. You have chest pain. You have shortness of breath. You faint. You have leg pain. You have stomach pain. Your vaginal bleeding saturates two or more sanitary pads in 1 hour. MAKE SURE YOU:  Understand these instructions. Will watch your condition. Will get help right away if you are not doing well or   get worse. Document Released: 05/27/2000 Document Revised: 10/14/2013 Document Reviewed: 01/25/2012 ExitCare Patient Information 2015 [employer]. This  information is not intended to replace advice given to you by your health care provider. Make sure you discuss any questions you have with your health care provider.  Sitz Bath A sitz bath is a warm water bath taken in the sitting position. The water covers only the hips and butt (buttocks). We recommend using one that fits in the toilet, to help with ease of use and cleanliness. It may be used for either healing or cleaning purposes. Sitz baths are also used to relieve pain, itching, or muscle tightening (spasms). The water may contain medicine. Moist heat will help you heal and relax.  HOME CARE  Take 3 to 4 sitz baths a day. Fill the bathtub half-full with warm water. Sit in the water and open the drain a little. Turn on the warm water to keep the tub half-full. Keep the water running constantly. Soak in the water for 15 to 20 minutes. After the sitz bath, pat the affected area dry. GET HELP RIGHT AWAY IF: You get worse instead of better. Stop the sitz baths if you get worse. MAKE SURE YOU: Understand these instructions. Will watch your condition. Will get help right away if you are not doing well or get worse. Document Released: 07/07/2004 Document Revised: 02/22/2012 Document Reviewed: 09/27/2010 ExitCare Patient Information 2015 [employer]. This information is not intended to replace advice given to you by your health care provider. Make sure you discuss any questions you have with your health care provider.   

## 2021-04-22 NOTE — Progress Notes (Signed)
Pt given discharge instructions and all questions answered. Pt verbalized understanding.  °

## 2021-04-23 LAB — RH IG WORKUP (INCLUDES ABO/RH)
Gestational Age(Wks): 18
Unit division: 0

## 2021-04-26 LAB — SURGICAL PATHOLOGY

## 2021-04-28 ENCOUNTER — Encounter: Payer: Medicaid Other | Admitting: Obstetrics and Gynecology

## 2021-04-30 ENCOUNTER — Institutional Professional Consult (permissible substitution): Payer: Medicaid Other | Admitting: Licensed Clinical Social Worker

## 2021-05-03 ENCOUNTER — Institutional Professional Consult (permissible substitution): Payer: Medicaid Other | Admitting: Licensed Clinical Social Worker

## 2021-05-10 ENCOUNTER — Telehealth: Payer: Self-pay

## 2021-05-10 NOTE — Telephone Encounter (Signed)
Pt left voicemail requesting a call back. Unable to reach patient; no voicemail available to leave a message.

## 2021-05-20 ENCOUNTER — Encounter: Payer: Self-pay | Admitting: Obstetrics

## 2021-05-20 ENCOUNTER — Ambulatory Visit (INDEPENDENT_AMBULATORY_CARE_PROVIDER_SITE_OTHER): Payer: Medicaid Other | Admitting: Obstetrics

## 2021-05-20 ENCOUNTER — Other Ambulatory Visit: Payer: Self-pay

## 2021-05-20 DIAGNOSIS — O99019 Anemia complicating pregnancy, unspecified trimester: Secondary | ICD-10-CM | POA: Diagnosis not present

## 2021-05-20 DIAGNOSIS — R52 Pain, unspecified: Secondary | ICD-10-CM

## 2021-05-20 DIAGNOSIS — O021 Missed abortion: Secondary | ICD-10-CM | POA: Diagnosis not present

## 2021-05-20 MED ORDER — VITAFOL FE+ 90-0.6-0.4-200 MG PO CAPS: 1.0000 | ORAL_CAPSULE | Freq: Every day | ORAL | 4 refills | Status: DC

## 2021-05-20 MED ORDER — IBUPROFEN 800 MG PO TABS: 800.0000 mg | ORAL_TABLET | Freq: Three times a day (TID) | ORAL | 5 refills | Status: DC | PRN

## 2021-05-20 NOTE — Progress Notes (Signed)
Post Partum Visit Note  Diamond Burton is a 35 y.o. [id number] female who presents for a postpartum visit. She is 4 weeks postpartum following a normal spontaneous vaginal delivery.  I have fully reviewed the prenatal and intrapartum course. The delivery was at 19 gestational weeks.  Anesthesia: none. Postpartum course has been up and down due to IUFD. Baby was IUFD. Bleeding staining only. Bowel function is normal. Bladder function is normal. Patient is not sexually active. Contraception method is none. Postpartum depression screening: positive. Patient states that she will discuss concerns and feelings with her psychiatrist.   The pregnancy intention screening data noted above was reviewed. Potential methods of contraception were discussed. The patient elected to proceed with No data recorded.   Edinburgh Postnatal Depression Scale - 05/20/21 1612       Edinburgh Postnatal Depression Scale:  In the Past 7 Days   I have been able to laugh and see the funny side of things. 0    I have looked forward with enjoyment to things. 0    I have blamed myself unnecessarily when things went wrong. 0    I have been anxious or worried for no good reason. 3    I have felt scared or panicky for no good reason. 2    Things have been getting on top of me. 0    I have been so unhappy that I have had difficulty sleeping. 3    I have felt sad or miserable. 2    I have been so unhappy that I have been crying. 1    The thought of harming myself has occurred to me. 0    Edinburgh Postnatal Depression Scale Total 11             Health Maintenance Due  Topic Date Due   COVID-19 Vaccine (1) Never done   INFLUENZA VACCINE  Never done    The following portions of the patient's history were reviewed and updated as appropriate: allergies, current medications, past family history, past medical history, past social history, past surgical history, and problem list.  Review of Systems A comprehensive review  of systems was negative except for: Behavioral/Psych: positive for depression  Objective:  BP 111/64   Pulse 61   Ht 5\' 6"  (1.676 m)   Wt 214 lb (97.1 kg)   LMP 12/06/2020   BMI 34.54 kg/m    General:  alert and no distress   Breasts:  normal  Lungs: clear to auscultation bilaterally  Heart:  regular rate and rhythm, S1, S2 normal, no murmur, click, rub or gallop  Abdomen: soft, non-tender; bowel sounds normal; no masses,  no organomegaly   Wound none  GU exam:  not indicated       Assessment:    1. Postpartum care following vaginal delivery  2. IUFD at less than 20 weeks of gestation - Monosomy X  3. Pain Rx: - ibuprofen (ADVIL) 800 MG tablet; Take 1 tablet (800 mg total) by mouth every 8 (eight) hours as needed.  Dispense: 30 tablet; Refill: 5  4. Anemia affecting pregnancy, antepartum Rx: - Prenat-Fe Poly-Methfol-FA-DHA (VITAFOL FE+) 90-0.6-0.4-200 MG CAPS; Take 1 capsule by mouth daily before breakfast.  Dispense: 90 capsule; Refill: 4    Plan:   Essential components of care per ACOG recommendations:  1.  Mood and well being: Patient with positive depression screening today. Reviewed local resources for support.  - Patient tobacco use? No.   - hx of drug  use? No.    2. Infant care and feeding:  -Patient currently breastmilk feeding? No.  -Social determinants of health (SDOH) reviewed in EPIC. No concerns  3. Sexuality, contraception and birth spacing - Patient does not want a pregnancy in the next year.  Desired family size is 4 children.  - Reviewed forms of contraception in tiered fashion. Patient desired no method today.   - Discussed birth spacing of 18 months  4. Sleep and fatigue -Encouraged family/partner/community support of 4 hrs of uninterrupted sleep to help with mood and fatigue  5. Physical Recovery  - Discussed patients delivery and complications. She describes her labor as good. - Patient had a Vaginal, no problems at delivery. Patient had   no  lacerations.  - Patient has urinary incontinence? No. - Patient is safe to resume physical and sexual activity  6.  Health Maintenance - HM due items addressed Yes - Last pap smear  Diagnosis  Date Value Ref Range Status  07/15/2020   Final   - Negative for intraepithelial lesion or malignancy (NILM)   Pap smear not done at today's visit.  -Breast Cancer screening indicated? No.   7. Chronic Disease/Pregnancy Condition follow up: None   Coral Ceo, MD [facility]  05/20/21

## 2021-06-03 ENCOUNTER — Ambulatory Visit: Payer: Medicaid Other | Admitting: Obstetrics & Gynecology

## 2021-06-08 ENCOUNTER — Other Ambulatory Visit (HOSPITAL_COMMUNITY)
Admission: RE | Admit: 2021-06-08 | Discharge: 2021-06-08 | Disposition: A | Discharge status: Home / Self Care | Payer: Medicaid Other | Source: Ambulatory Visit | Attending: Family Medicine | Admitting: Family Medicine

## 2021-06-08 ENCOUNTER — Encounter: Payer: Self-pay | Admitting: Family Medicine

## 2021-06-08 ENCOUNTER — Other Ambulatory Visit: Payer: Self-pay

## 2021-06-08 ENCOUNTER — Ambulatory Visit (INDEPENDENT_AMBULATORY_CARE_PROVIDER_SITE_OTHER): Payer: Medicaid Other | Admitting: Family Medicine

## 2021-06-08 VITALS — BP 128/83 | HR 81 | Ht 64.0 in | Wt 215.7 lb

## 2021-06-08 DIAGNOSIS — N898 Other specified noninflammatory disorders of vagina: Secondary | ICD-10-CM

## 2021-06-08 DIAGNOSIS — Z3009 Encounter for other general counseling and advice on contraception: Secondary | ICD-10-CM

## 2021-06-08 DIAGNOSIS — Z113 Encounter for screening for infections with a predominantly sexual mode of transmission: Secondary | ICD-10-CM

## 2021-06-08 DIAGNOSIS — Z3202 Encounter for pregnancy test, result negative: Secondary | ICD-10-CM | POA: Diagnosis not present

## 2021-06-08 LAB — POCT URINE PREGNANCY: Preg Test, Ur: NEGATIVE

## 2021-06-08 NOTE — Progress Notes (Signed)
IUFD 04/22/21  Postpartum visit at 4 wks Wants STD testing today blood and vaginal swab. Reports vaginal itching. Wants to discuss/ start contraception.

## 2021-06-08 NOTE — Progress Notes (Signed)
° °  Subjective:    Patient ID: Diamond Burton is a 35 y.o. female presenting with Gynecologic Exam  on 06/08/2021  HPI: Patient with 17 wk IUFD due to monosomy X. Doing well post delivery which occurred on 04/22/2021. Has had unprotected intercourse. LMP approx. 3 weeks ago. Having some vaginal irritation. Would like STD testing.  Review of Systems  Constitutional:  Negative for chills and fever.  Respiratory:  Negative for shortness of breath.   Cardiovascular:  Negative for chest pain.  Gastrointestinal:  Negative for abdominal pain, nausea and vomiting.  Genitourinary:  Positive for vaginal discharge (pink tinged). Negative for dysuria.  Skin:  Negative for rash.     Objective:    BP 128/83    Pulse 81    Ht 5\' 4"  (1.626 m)    Wt 215 lb 11.2 oz (97.8 kg)    LMP 12/06/2020    BMI 37.02 kg/m  Physical Exam Exam conducted with a chaperone present.  Constitutional:      General: She is not in acute distress.    Appearance: She is well-developed.  HENT:     Head: Normocephalic and atraumatic.  Eyes:     General: No scleral icterus. Cardiovascular:     Rate and Rhythm: Normal rate.  Pulmonary:     Effort: Pulmonary effort is normal.  Abdominal:     Palpations: Abdomen is soft.  Musculoskeletal:     Cervical back: Neck supple.  Skin:    General: Skin is warm and dry.  Neurological:     Mental Status: She is alert and oriented to person, place, and time.        Assessment & Plan:  Screen for STD (sexually transmitted disease) - Plan: CANCELED: Hepatitis B surface antigen, CANCELED: Hepatitis C antibody, CANCELED: HIV Antibody (routine testing w rflx), CANCELED: RPR  Vaginal discharge - check labs and treat accordingly - Plan: Cervicovaginal ancillary only( )  Urine pregnancy test negative - Plan: POCT urine pregnancy  Encounter for counseling regarding contraception - discussed all options, she does not desire another pregnancy, after careful consideration  for Nexplanon--needs no unprotected intercourse and neg UPT in 2 wks  Return in about 2 weeks (around 06/22/2021) for NEXPLANON.  08/20/2021 06/08/2021 2:06 PM

## 2021-06-08 NOTE — Patient Instructions (Signed)
Do not have unprotected intercourse for the next 2 weeks.

## 2021-06-09 LAB — CERVICOVAGINAL ANCILLARY ONLY
Bacterial Vaginitis (gardnerella): NEGATIVE
Candida Glabrata: NEGATIVE
Candida Vaginitis: POSITIVE — AB
Chlamydia: NEGATIVE
Comment: NEGATIVE
Comment: NEGATIVE
Comment: NEGATIVE
Comment: NEGATIVE
Comment: NEGATIVE
Comment: NORMAL
Neisseria Gonorrhea: NEGATIVE
Trichomonas: NEGATIVE

## 2021-06-09 MED ORDER — FLUCONAZOLE 150 MG PO TABS: 150.0000 mg | ORAL_TABLET | Freq: Every day | ORAL | 2 refills | Status: DC

## 2021-06-09 NOTE — Addendum Note (Signed)
Addended by: Reva Bores on: 06/09/2021 01:17 PM   Modules accepted: Orders

## 2021-07-01 IMAGING — US US MFM OB FOLLOW-UP
1 series · 14 of 28 positions shown · non-contrast
Comparison: none

[Series 1: us mfm ob follow-up · 77 acquisitions, 14 frames shown]
[im 3/77]
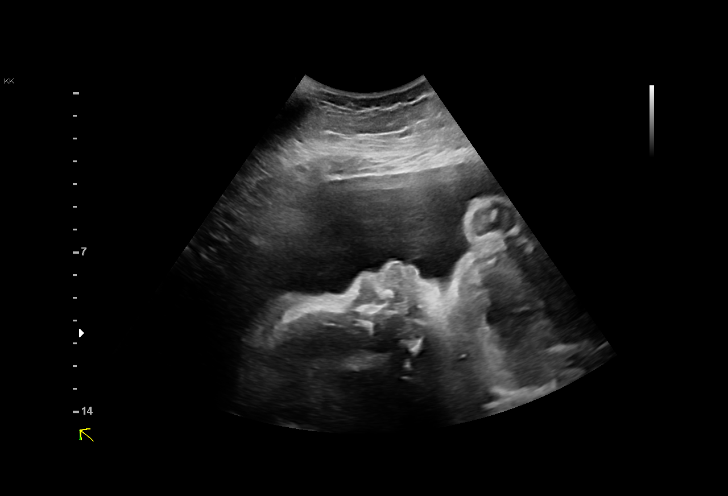
[im 9/77]
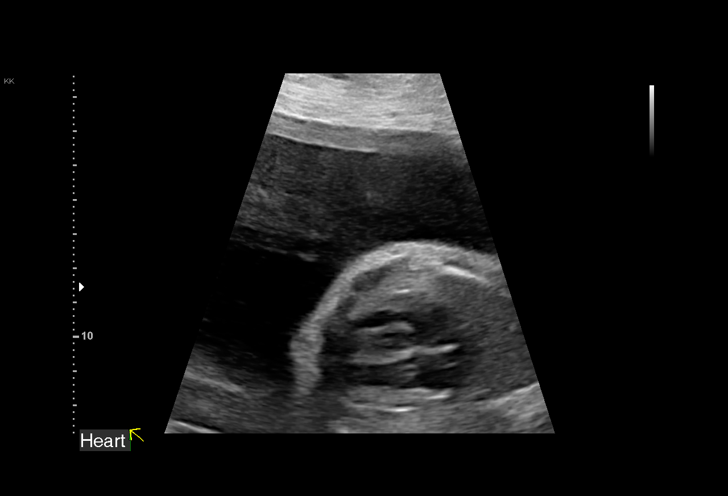
[im 15/77]
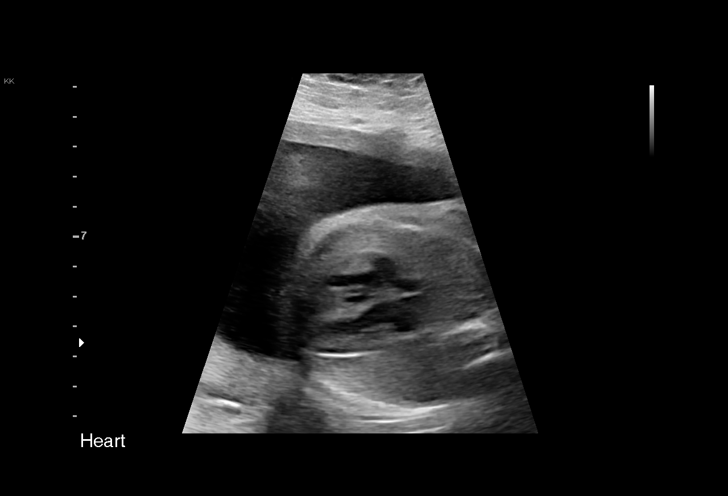
[im 20/77]
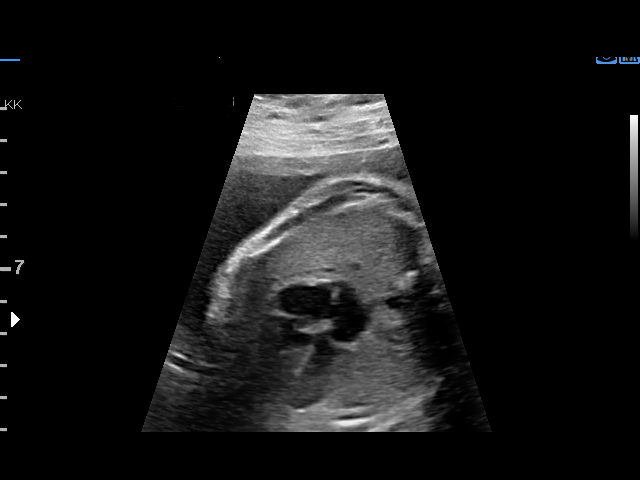
[im 26/77]
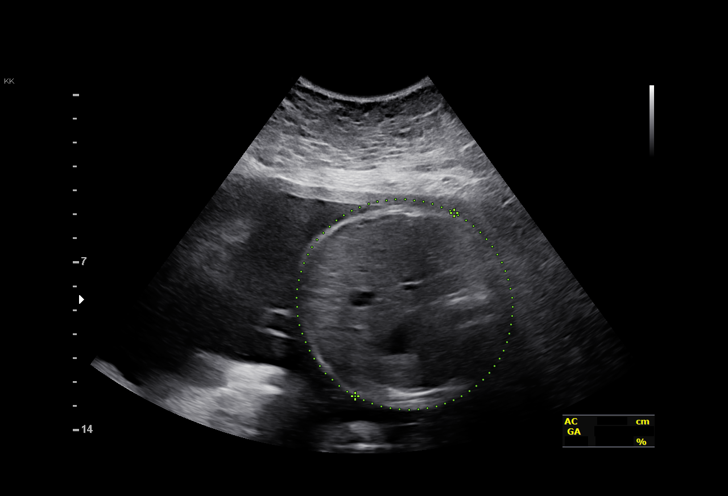
[im 31/77]
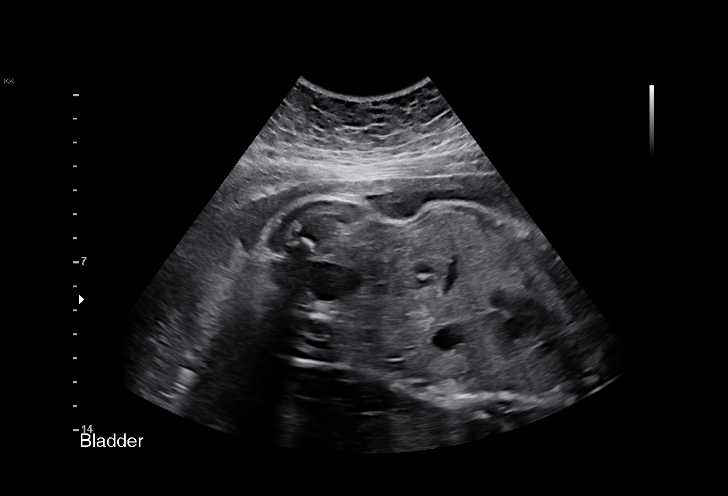
[im 37/77]
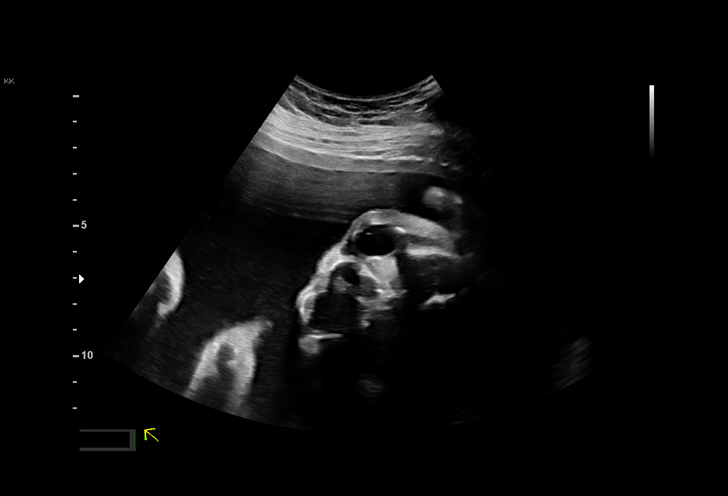
[im 43/77]
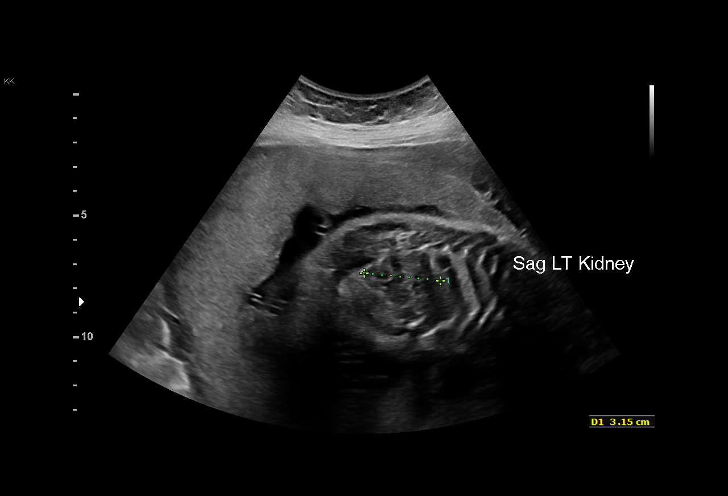
[im 48/77]
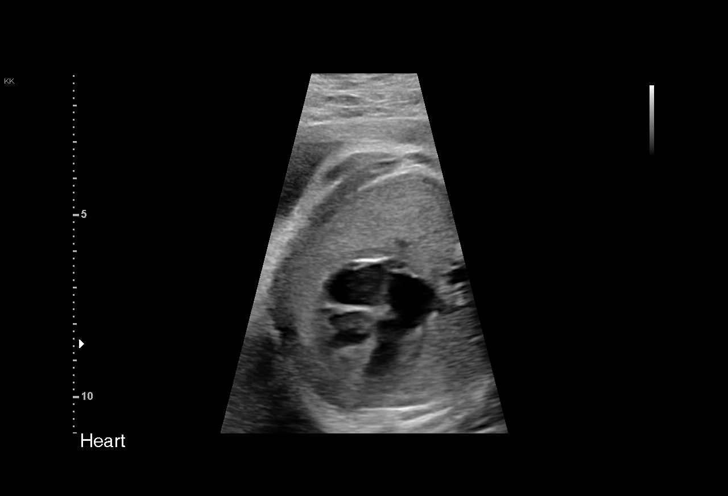
[im 54/77]
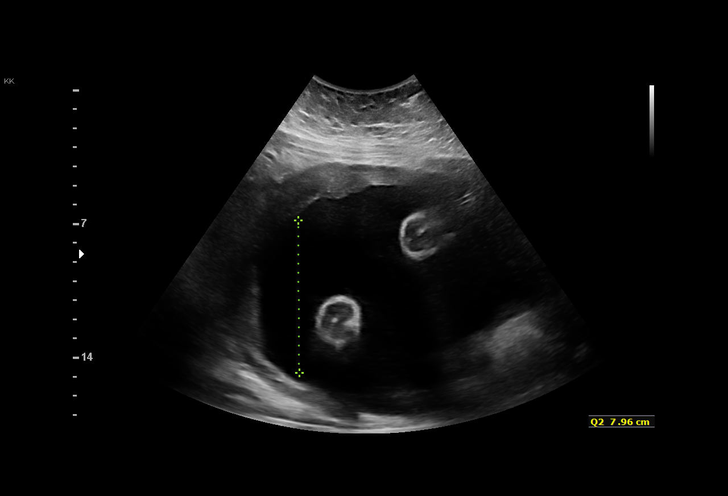
[im 60/77]
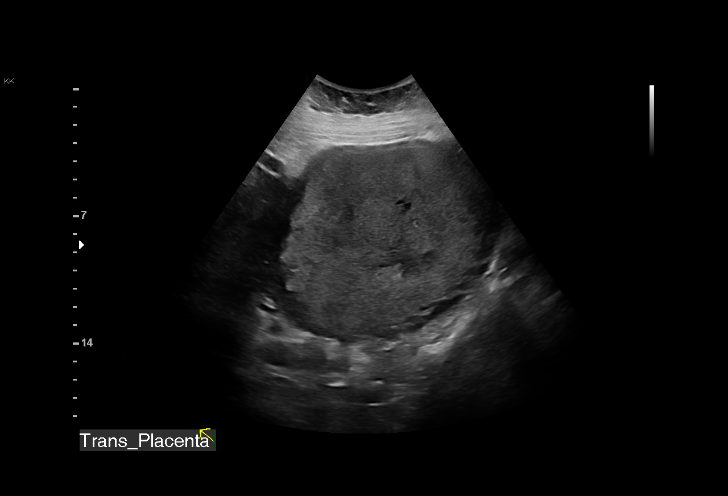
[im 65/77]
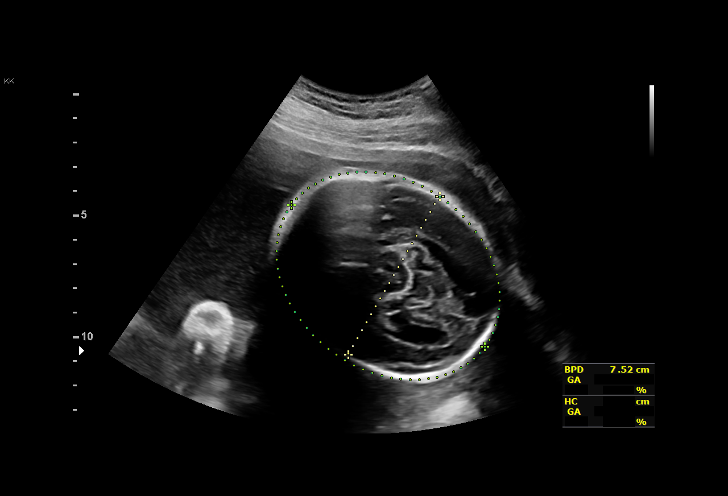
[im 71/77]
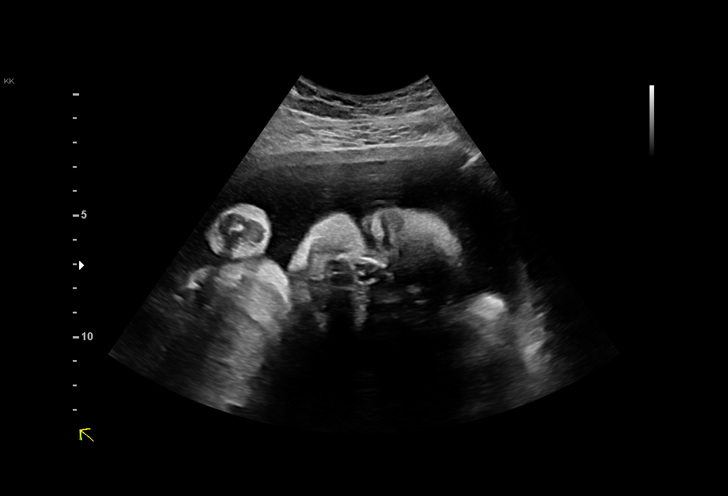
[im 77/77]
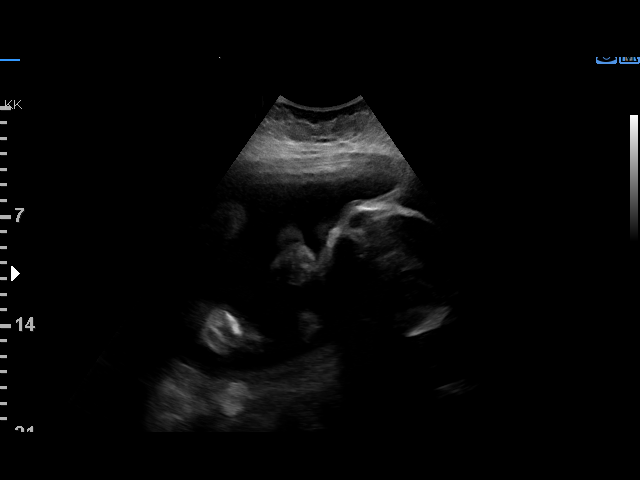

[14 of 28 positions shown; findings below may reference images not displayed]

----------------------------------------------------------------------

 ----------------------------------------------------------------------
Indications

  28 weeks gestation of pregnancy
  Obesity complicating pregnancy, second
  trimester
  Antenatal follow-up for nonvisualized fetal
  anatomy
  Low Risk NIPS
 ----------------------------------------------------------------------
Vital Signs

 BMI:
Fetal Evaluation

 Num Of Fetuses:          1
 Fetal Heart Rate(bpm):   145
 Cardiac Activity:        Observed
 Presentation:            Cephalic
 Placenta:                Posterior
 P. Cord Insertion:       Previously Visualized

 Amniotic Fluid
 AFI FV:      Subjectively increased

 AFI Sum(cm)     %Tile       Largest Pocket(cm)
 24.4            96

 RUQ(cm)       RLQ(cm)       LUQ(cm)        LLQ(cm)

Biometry

 BPD:      75.2  mm     G. Age:  30w 1d         85  %    CI:        74.14   %    70 - 86
                                                         FL/HC:       21.9  %    19.6 -
 HC:      277.3  mm     G. Age:  30w 2d         72  %    HC/AC:       0.99       0.99 -
 AC:      279.7  mm     G. Age:  32w 0d       > 99  %    FL/BPD:      80.7  %    71 - 87
 FL:       60.7  mm     G. Age:  31w 4d         97  %    FL/AC:       21.7  %    20 - 24

 Est. FW:    5141   gm   3 lb 15 oz    > 99  %
OB History

 Gravidity:    5         Term:   3        Prem:   0        SAB:   1
 TOP:          0       Ectopic:  0        Living: 3
Gestational Age

 U/S Today:     31w 0d                                        EDD:   04/16/19
 Best:          28w 4d     Det. By:  Early Ultrasound         EDD:   05/03/19
                                     (09/11/18)
Anatomy

 Cranium:               Appears normal         LVOT:                   Appears normal
 Cavum:                 Appears normal         Aortic Arch:            Previously seen
 Ventricles:            Appears normal         Ductal Arch:            Not well visualized
 Choroid Plexus:        Appears normal         Diaphragm:              Appears normal
 Cerebellum:            Appears normal         Stomach:                Appears normal, left
                                                                       sided
 Posterior Fossa:       Appears normal         Abdomen:                Appears normal
 Nuchal Fold:           Not applicable (>20    Abdominal Wall:         Previously seen
                        wks GA)
 Face:                  Orbits appear          Cord Vessels:           Previously seen
                        normal
 Lips:                  Appears normal         Kidneys:                Appear normal
 Palate:                Not well visualized    Bladder:                Appears normal
 Thoracic:              Appears normal         Spine:                  Previously seen
 Heart:                 Appears normal         Upper Extremities:      Previously seen
                        (4CH, axis, and
                        situs)
 RVOT:                  Appears normal         Lower Extremities:      Previously seen

 Other:  Heels and  Left 5th digit previously visualized. Right hand not well
         visualized. Fetus appears to be a male. Technically difficult due to
         maternal habitus and fetal position.
Cervix Uterus Adnexa

 Cervix
 Not visualized (advanced GA >15wks)
Impression

 Normal interval growth.
 Amniotic fluid subjectivey increased.
 Low risk NIPS
Recommendations

 Consider follow up growth in 4 weeks

## 2021-07-08 ENCOUNTER — Ambulatory Visit: Payer: Medicaid Other | Admitting: Obstetrics

## 2021-07-13 ENCOUNTER — Ambulatory Visit: Payer: Medicaid Other | Admitting: Obstetrics

## 2021-07-21 ENCOUNTER — Encounter: Payer: Self-pay | Admitting: Obstetrics

## 2021-07-21 ENCOUNTER — Other Ambulatory Visit (HOSPITAL_COMMUNITY)
Admission: RE | Admit: 2021-07-21 | Discharge: 2021-07-21 | Disposition: A | Discharge status: Home / Self Care | Payer: Medicaid Other | Source: Ambulatory Visit | Attending: Obstetrics | Admitting: Obstetrics

## 2021-07-21 ENCOUNTER — Other Ambulatory Visit: Payer: Self-pay

## 2021-07-21 ENCOUNTER — Ambulatory Visit (INDEPENDENT_AMBULATORY_CARE_PROVIDER_SITE_OTHER): Payer: Medicaid Other | Admitting: Obstetrics

## 2021-07-21 VITALS — BP 142/86 | HR 83 | Ht 65.0 in | Wt 216.4 lb

## 2021-07-21 DIAGNOSIS — N898 Other specified noninflammatory disorders of vagina: Secondary | ICD-10-CM | POA: Insufficient documentation

## 2021-07-21 DIAGNOSIS — Z113 Encounter for screening for infections with a predominantly sexual mode of transmission: Secondary | ICD-10-CM

## 2021-07-21 NOTE — Progress Notes (Signed)
Patient ID: Diamond Burton, female   DOB: Feb 02, 1986, 36 y.o.   MRN: [id number]  Chief Complaint  Patient presents with   Gynecologic Exam    HPI Diamond Burton is a 36 y.o. female.  Complains of vaginal discharge. HPI  Past Medical History:  Diagnosis Date   Abnormal Pap smear    Bipolar disorder (HCC)    not taking meds due to preg   Chlamydia infection affecting pregnancy in first trimester 10/10/2018   Depression    stopped meds, was wanting to see counselor. 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   History of gonorrhea    Hydradenitis    Infection    UTI   Kidney stones    Ovarian cyst    Periodontal disease    PID (pelvic inflammatory disease)    PTSD (post-traumatic stress disorder)    Trichomonas contact     Past Surgical History:  Procedure Laterality Date   HYDRADENITIS EXCISION  03/2019   IRRIGATION AND DEBRIDEMENT ABSCESS Left 03/20/2019   Procedure: IRRIGATION AND DEBRIDEMENT BREAST ABSCESS;  Surgeon: Abigail Miyamoto, MD;  Location: [facility] OR;  Service: General;  Laterality: Left;   NO PAST SURGERIES      Family History  Problem Relation Age of Onset   Depression Mother    Diabetes Mother    Hypertension Mother    Heart disease Mother    Asthma Sister    Asthma Daughter    Hypertension Maternal Grandmother    Diabetes Maternal Grandmother     Social History Social History   Tobacco Use   Smoking status: Former    Packs/day: 0.50    Years: 2.00    Pack years: 1.00    Types: Cigarettes   Smokeless tobacco: Never  Vaping Use   Vaping Use: Never used  Substance Use Topics   Alcohol use: Yes    Comment: Occ    Drug use: No    Allergies  Allergen Reactions   Latex     Current Outpatient Medications  Medication Sig Dispense Refill   ibuprofen (ADVIL) 800 MG tablet Take 1 tablet (800 mg total) by mouth every 8 (eight) hours as needed. 30 tablet 5   valACYclovir (VALTREX) 500 MG tablet Take 500 mg by mouth 2 (two) times daily.     acetaminophen (TYLENOL) 325 MG tablet Take 2 tablets (650 mg total) by mouth every 4 (four) hours as needed (for pain scale < 4). (Patient not taking: Reported on 06/08/2021)     fluconazole (DIFLUCAN) 150 MG tablet Take 1 tablet (150 mg total) by mouth daily. Repeat in 24 hours if needed (Patient not taking: Reported on 07/21/2021) 2 tablet 2   PARoxetine (PAXIL) 20 MG tablet Take 20 mg by mouth daily. (Patient not taking: Reported on 07/21/2021)     Prenat-Fe Poly-Methfol-FA-DHA (VITAFOL FE+) 90-0.6-0.4-200 MG CAPS Take 1 capsule by mouth daily before breakfast. (Patient not taking: Reported on 07/21/2021) 90 capsule 4   No current facility-administered medications for this visit.    Review of Systems Review of Systems Constitutional: negative for  fatigue and weight loss Respiratory: negative for cough and wheezing Cardiovascular: negative for chest pain, fatigue and palpitations Gastrointestinal: negative for abdominal pain and change in bowel habits Genitourinary: positive for vaginal discharge Integument/breast: negative for nipple discharge Musculoskeletal:negative for myalgias Neurological: negative for gait problems and tremors Behavioral/Psych: negative for abusive relationship, depression Endocrine: negative for temperature intolerance      Blood pressure (!) 142/86, pulse 83, height 5\' 5"  (1.651 m), weight 216 lb 6.4 oz (98.2 kg), unknown if currently breastfeeding.  Physical Exam Physical Exam General:   Alert and no distress  Skin:   no rash or abnormalities  Lungs:   clear to auscultation bilaterally  Heart:   regular rate and rhythm, S1, S2 normal,  no murmur, click, rub or gallop  Breasts:   Not examined  Abdomen:  normal findings: no organomegaly, soft, non-tender and no hernia  Pelvis:  External genitalia: normal general appearance Urinary system: urethral meatus normal and bladder without fullness, nontender Vaginal: normal without tenderness, induration or masses Cervix: normal appearance Adnexa: normal bimanual exam Uterus: anteverted and non-tender, normal size    I have spent a total of 20 minutes of face-to-face time, excluding clinical staff time, reviewing notes and preparing to see patient, ordering tests and/or medications, and counseling the patient.   Data Reviewed Wet Prep  Assessment     1. Vaginal discharge Rx: - Cervicovaginal ancillary only( [place])  2. Screening for STD (sexually transmitted disease) Rx: - Hepatitis B surface antigen - Hepatitis C antibody - HIV Antibody (routine testing w rflx) - RPR     Plan   Follow up in 3 months  Orders Placed This Encounter  Procedures   Hepatitis B surface antigen   Hepatitis C antibody   HIV Antibody (routine testing w rflx)   RPR     , MD 07/21/2021 10:41 AM

## 2021-07-21 NOTE — Progress Notes (Signed)
Patient presents for STD testing and bloodwork. 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 Denies symptoms at this time.  Last PAP 07/15/2020.

## 2021-07-22 ENCOUNTER — Ambulatory Visit: Payer: Medicaid Other

## 2021-07-22 LAB — CERVICOVAGINAL ANCILLARY ONLY
Bacterial Vaginitis (gardnerella): POSITIVE — AB
Candida Glabrata: NEGATIVE
Candida Vaginitis: POSITIVE — AB
Chlamydia: NEGATIVE
Comment: NEGATIVE
Comment: NEGATIVE
Comment: NEGATIVE
Comment: NEGATIVE
Comment: NEGATIVE
Comment: NORMAL
Neisseria Gonorrhea: NEGATIVE
Trichomonas: NEGATIVE

## 2021-07-23 LAB — HIV 1/2 AB DIFFERENTIATION
HIV 1 Ab: REACTIVE
HIV 2 Ab: NONREACTIVE
NOTE (HIV CONF MULTIP: POSITIVE — AB

## 2021-07-23 LAB — HEPATITIS C ANTIBODY: Hep C Virus Ab: <0.1 s/co ratio (ref 0.0–0.9)

## 2021-07-23 LAB — RPR, QUANT+TP ABS (REFLEX)
Rapid Plasma Reagin, Quant: 1:1 {titer} — ABNORMAL HIGH
T Pallidum Abs: REACTIVE — AB

## 2021-07-23 LAB — RPR: RPR Ser Ql: REACTIVE — AB

## 2021-07-23 LAB — HEPATITIS B SURFACE ANTIGEN: Hepatitis B Surface Ag: NEGATIVE

## 2021-07-23 LAB — HIV ANTIBODY (ROUTINE TESTING W REFLEX): HIV Screen 4th Generation wRfx: REACTIVE

## 2021-07-27 ENCOUNTER — Ambulatory Visit: Payer: Medicaid Other | Admitting: Infectious Diseases

## 2021-07-27 ENCOUNTER — Other Ambulatory Visit: Payer: Self-pay

## 2021-07-27 ENCOUNTER — Encounter: Payer: Self-pay | Admitting: Infectious Diseases

## 2021-07-27 ENCOUNTER — Other Ambulatory Visit (HOSPITAL_COMMUNITY): Payer: Self-pay

## 2021-07-27 VITALS — BP 118/76 | HR 76 | Temp 98.7°F | Ht 65.0 in | Wt 216.0 lb

## 2021-07-27 DIAGNOSIS — B9689 Other specified bacterial agents as the cause of diseases classified elsewhere: Secondary | ICD-10-CM | POA: Diagnosis not present

## 2021-07-27 DIAGNOSIS — B20 Human immunodeficiency virus [HIV] disease: Secondary | ICD-10-CM | POA: Insufficient documentation

## 2021-07-27 DIAGNOSIS — A539 Syphilis, unspecified: Secondary | ICD-10-CM | POA: Diagnosis not present

## 2021-07-27 DIAGNOSIS — Z21 Asymptomatic human immunodeficiency virus [HIV] infection status: Secondary | ICD-10-CM | POA: Diagnosis present

## 2021-07-27 DIAGNOSIS — Z8619 Personal history of other infectious and parasitic diseases: Secondary | ICD-10-CM

## 2021-07-27 DIAGNOSIS — N760 Acute vaginitis: Secondary | ICD-10-CM | POA: Diagnosis not present

## 2021-07-27 DIAGNOSIS — Z3202 Encounter for pregnancy test, result negative: Secondary | ICD-10-CM | POA: Diagnosis not present

## 2021-07-27 LAB — POCT URINE PREGNANCY: Preg Test, Ur: NEGATIVE

## 2021-07-27 MED ORDER — PENICILLIN G BENZATHINE 1200000 UNIT/2ML IM SUSY
1.2000 10*6.[IU] | PREFILLED_SYRINGE | Freq: Once | INTRAMUSCULAR | Status: AC
  Administered 2021-07-27: 1.2 10*6.[IU] via INTRAMUSCULAR

## 2021-07-27 MED ORDER — METRONIDAZOLE 500 MG PO TABS: 500.0000 mg | ORAL_TABLET | Freq: Three times a day (TID) | ORAL | 1 refills | Status: DC

## 2021-07-27 MED ORDER — GENVOYA 150-150-200-10 MG PO TABS: 1.0000 | ORAL_TABLET | Freq: Every day | ORAL | 2 refills | Status: DC

## 2021-07-27 NOTE — Progress Notes (Signed)
Name: Diamond Burton  DOB: 1985/09/20 MRN: [id number] PCP: [facility]    Brief Narrative:  Layla Kesling is a 36 y.o. female with HIV disease, just diagnosed 07/2021. CD4 nadir pending VL pending HIV Risk: sexual History of OIs: none Intake Labs pending: Hep B sAg (-), sAb (), cAb (); Hep A (), Hep C () Quantiferon () HLA B*5701 () G6PD: ()   Previous Regimens: naive  Genotypes: pending  Subjective:   Chief Complaint  Patient presents with   New Patient (Initial Visit)     HPI: Diamond Burton is here today for her first visit for HIV care.  Working with [facility] [employer] [facility] [place] in Nov 2022. She does have a history of PID and chlamydia infections in the past along with syphilis during pregnancy (son is 5 now). She had a newly reactive HIV antibody test last week with her women's health team after she presented for concern for STI testing. She is still in shock and working through diagnosis. She has a lot of questions about HIV symptoms, transmission and timeline for her infection. She is ready to take medication but is nervous about medication side effects.   RRP history - seems to have been intermittently reactive with low 1:1 titer. She was non-reactive from August 2018 - Feb 2023. She has had unprotected sexual contact with female partners. She does not have any current symptoms from what she can tell.  Last Pap smear 07/15/2020 - normal per records. Was offered nexplanon but did not go through with the placement and is not currently on contraception. She has had unprotected sex lately with 47 female partner. She does have some uterine cramping / vaginal symptoms and said she was told she had BV but not treated.   She has a history of bipolar depression - has a psychiatry team that she is working to get in touch with.   Depression screen PHQ 2/9 07/27/2021  Decreased Interest 1  Down, Depressed, Hopeless 1  PHQ - 2 Score 2  Altered sleeping 3   Tired, decreased energy 3  Change in appetite 0  Feeling bad or failure about yourself  1  Trouble concentrating 1  Moving slowly or fidgety/restless 1  Suicidal thoughts 0  PHQ-9 Score 11  Difficult doing work/chores Somewhat difficult    Review of Systems  Constitutional:  Negative for chills, fever, malaise/fatigue and weight loss.  HENT:  Negative for sore throat.   Respiratory:  Negative for cough and sputum production.   Cardiovascular:  Positive for chest pain. Negative for leg swelling.  Gastrointestinal:  Negative for abdominal pain, diarrhea and vomiting.  Genitourinary:  Negative for dysuria and flank pain.  Musculoskeletal:  Negative for joint pain, myalgias and neck pain.  Skin:  Positive for rash (papular rash that itches on chest).  Neurological:  Negative for dizziness, tingling and headaches.  Psychiatric/Behavioral:  Negative for depression and substance abuse. The patient is nervous/anxious. The patient does not have insomnia.   All other systems reviewed and are negative.  Past Medical History:  Diagnosis Date   Abnormal Pap smear    Bipolar disorder ([place])    not taking meds due to preg   Chlamydia infection affecting pregnancy in first trimester 10/10/2018   Depression    stopped meds, was wanting to see counselor. 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 30 tablet 5   Prenat-Fe Poly-Methfol-FA-DHA (VITAFOL FE+) 90-0.6-0.4-200 MG CAPS Take 1 capsule by mouth daily before breakfast. 90 capsule 4   valACYclovir (VALTREX) 500 MG tablet Take 500 mg by mouth 2 (two) times daily.     acetaminophen (TYLENOL) 325 MG tablet Take 2 tablets (650 mg total) by mouth every 4 (four) hours as needed (for pain scale < 4). (Patient not taking: Reported on 06/08/2021)     fluconazole (DIFLUCAN) 150 MG tablet Take 1 tablet (150 mg total) by mouth daily. Repeat in 24 hours if needed (Patient not taking: Reported on 07/21/2021) 2 tablet 2   PARoxetine (PAXIL) 20 MG tablet Take 20 mg by mouth daily. (Patient not taking: Reported on 07/21/2021)     No facility-administered medications prior to visit.     Allergies  Allergen Reactions   Latex     Social History   Tobacco Use   Smoking status: Former    Packs/day: 0.50    Years: 2.00    Pack years: 1.00    Types: Cigarettes   Smokeless tobacco: Never  Vaping Use   Vaping Use: Never used  Substance Use Topics   Alcohol use: Yes    Comment: Occ   Drug use: No    Family History  Problem Relation Age of Onset   Depression Mother    Diabetes Mother    Hypertension Mother    Heart disease Mother    Asthma Sister    Asthma Daughter    Hypertension Maternal Grandmother    Diabetes Maternal Grandmother     Social History   Substance and Sexual Activity  Sexual Activity Yes   Partners: Male   Birth control/protection: None     Objective:   Vitals:   07/27/21 1407  BP: 118/76  Pulse: 76  Temp: 98.7 F (37.1 C)  TempSrc: Oral  Weight: 216 lb (98 kg)  Height: 5' 5" (1.651 m)   Body mass index is 35.94 kg/m.  Physical Exam Vitals reviewed.  Constitutional:      Appearance: Normal appearance. She is not ill-appearing.  Cardiovascular:     Rate and  Rhythm: Normal rate.  Pulmonary:     Effort: Pulmonary effort is normal.  Musculoskeletal:        General: Normal range of motion.  Skin:    General: Skin is warm and dry.     Comments: Small papular rash to the chest. Very faint and scattered  Neurological:     Mental Status: She is oriented to person, place, and time.  Psychiatric:        Attention and Perception: Attention normal.        Mood and  Affect: Mood is anxious. Affect is tearful.    Lab Results Lab Results  Component Value Date   WBC 8.6 04/20/2021   HGB 12.1 04/20/2021   HCT 35.5 (L) 04/20/2021   MCV 87.9 04/20/2021   PLT 303 04/20/2021    Lab Results  Component Value Date   CREATININE 0.57 04/20/2021   BUN 7 04/20/2021   NA 132 (L) 04/20/2021   K 3.6 04/20/2021   CL 103 04/20/2021   CO2 19 (L) 04/20/2021    Lab Results  Component Value Date   ALT 16 04/20/2021   AST 17 04/20/2021   ALKPHOS 92 04/20/2021   BILITOT 0.3 04/20/2021    No results found for: CHOL, HDL, LDLCALC, LDLDIRECT, TRIG, CHOLHDL No results found for: HIV1RNAQUANT, HIV1RNAVL, CD4TABS   Assessment & Plan:   Problem List Items Addressed This Visit       Unprioritized   History of syphilis    Treated in 2017 during pregnancy from old records in our EMR. She has had a non-reactive RPR between Aug 2018 through recent testing in Feb 2023.   Urine pregnancy test today - pregnancy can cause false positive RPR with low titer like this; alternatively she could have had a new syphilis infection with history. Treat with 1 round of bicillin injections today for concern over new infection.       HIV test positive ([place]) - Primary    New patient here to establish for HIV care. Newly diagnosed with previously non-reactive HIV test in November/December 2022. Ready to start medications though very worried about weight gain potential with some of them she has read about.   I discussed with Demetrica Hoose treatment options/side effects, benefits  of treatment and long-term outcomes. I discussed how HIV is transmitted and the process of untreated HIV including increased risk for opportunistic infections, cancer, dementia and renal failure. Patient was counseled on routine HIV care including medication adherence, blood monitoring, necessary vaccines and follow up visits. Counseled regarding safe sex practices including: condom use, partner disclosure, limiting partners.  Will start Crisp once daily for HIV treatment with food. Counseled on new medication and hopes that she will not gain weight on this drug. Alternatively can try Delstrigo, Dovato or Odefsey possibly (when VL comes down and genotype returns). Single tablet is best option for her.   General introduction to our clinic and integrated services. She has a counselor/psychiatrist she will reach out to for support and sleep help.   I spent greater than 45 minutes with the patient today, with a majority of the time spent face-to-face counseling and coordination of care re: HIV and health maintenance.        Relevant Medications   elvitegravir-cobicistat-emtricitabine-tenofovir (GENVOYA) 150-150-200-10 MG TABS tablet   metroNIDAZOLE (FLAGYL) 500 MG tablet   Other Relevant Orders   POCT urine pregnancy   HIV-1 RNA ultraquant reflex to gentyp+   COMPLETE METABOLIC PANEL WITH GFR   CBC with Differential/Platelet   T-helper cells (CD4) count (not at [facility])   Bacterial vaginitis    Will treat with metronidazole 500 mg bid x 7d.       Relevant Medications   elvitegravir-cobicistat-emtricitabine-tenofovir (GENVOYA) 150-150-200-10 MG TABS tablet   metroNIDAZOLE (FLAGYL) 500 MG tablet    Janene Madeira, MSN, NP-C [facility] for Infectious [place] Pager: [phone] Office: [phone]  07/27/21  3:17 PM

## 2021-07-27 NOTE — Patient Instructions (Addendum)
Nice to meet you and we are going to take good care of you.   Please start GENVOYA once a day with a meal. Take this every single day at the same time.  It is very important that if you start any new medications let us know so we can make sure there is no interactions between the new medication.  Everything on your medication list, including the flagyl is ok to take with your new medication.   START Metronidazole one tab twice a day - I would take this with food too because it can taste nasty and cause some nausea. I am hopeful this will fix your cramping.   Please come back in 2 weeks to meet with our pharmacy team to see how you are doing on your new medication.   Please stop by the lab on your way out for blood and urine test.

## 2021-07-27 NOTE — Assessment & Plan Note (Signed)
Will treat with metronidazole 500 mg bid x 7d.

## 2021-07-27 NOTE — Assessment & Plan Note (Addendum)
Treated in 2017 during pregnancy from old records in our EMR. She has had a non-reactive RPR between Aug 2018 through recent testing in Feb 2023.   Urine pregnancy test today - pregnancy can cause false positive RPR with low titer like this; alternatively she could have had a new syphilis infection with history. Treat with 1 round of bicillin injections today for concern over new infection.

## 2021-07-27 NOTE — Assessment & Plan Note (Signed)
New patient here to establish for HIV care. Newly diagnosed with previously non-reactive HIV test in November/December 2022. Ready to start medications though very worried about weight gain potential with some of them she has read about.   I discussed with Edita Orlick treatment options/side effects, benefits of treatment and long-term outcomes. I discussed how HIV is transmitted and the process of untreated HIV including increased risk for opportunistic infections, cancer, dementia and renal failure. Patient was counseled on routine HIV care including medication adherence, blood monitoring, necessary vaccines and follow up visits. Counseled regarding safe sex practices including: condom use, partner disclosure, limiting partners.  Will start GENVOYA once daily for HIV treatment with food. Counseled on new medication and hopes that she will not gain weight on this drug. Alternatively can try Delstrigo, Dovato or Odefsey possibly (when VL comes down and genotype returns). Single tablet is best option for her.   General introduction to our clinic and integrated services. She has a counselor/psychiatrist she will reach out to for support and sleep help.   I spent greater than 45 minutes with the patient today, with a majority of the time spent face-to-face counseling and coordination of care re: HIV and health maintenance.

## 2021-08-10 ENCOUNTER — Ambulatory Visit: Payer: Medicaid Other | Admitting: Pharmacist

## 2021-08-17 LAB — CBC WITH DIFFERENTIAL/PLATELET
Absolute Monocytes: 534 cells/uL (ref 200–950)
Basophils Absolute: 78 cells/uL (ref 0–200)
Basophils Relative: 1.3 %
Eosinophils Absolute: 192 cells/uL (ref 15–500)
Eosinophils Relative: 3.2 %
HCT: 43.9 % (ref 35.0–45.0)
Hemoglobin: 14.5 g/dL (ref 11.7–15.5)
Lymphs Abs: 2700 cells/uL (ref 850–3900)
MCH: 28.8 pg (ref 27.0–33.0)
MCHC: 33 g/dL (ref 32.0–36.0)
MCV: 87.3 fL (ref 80.0–100.0)
MPV: 10.7 fL (ref 7.5–12.5)
Monocytes Relative: 8.9 %
Neutro Abs: 2496 cells/uL (ref 1500–7800)
Neutrophils Relative %: 41.6 %
Platelets: 287 10*3/uL (ref 140–400)
RBC: 5.03 10*6/uL (ref 3.80–5.10)
RDW: 13 % (ref 11.0–15.0)
Total Lymphocyte: 45 %
WBC: 6 10*3/uL (ref 3.8–10.8)

## 2021-08-17 LAB — COMPLETE METABOLIC PANEL WITH GFR
AG Ratio: 1.1 (calc) (ref 1.0–2.5)
ALT: 14 U/L (ref 6–29)
AST: 14 U/L (ref 10–30)
Albumin: 4 g/dL (ref 3.6–5.1)
Alkaline phosphatase (APISO): 86 U/L (ref 31–125)
BUN: 11 mg/dL (ref 7–25)
CO2: 26 mmol/L (ref 20–32)
Calcium: 9.1 mg/dL (ref 8.6–10.2)
Chloride: 103 mmol/L (ref 98–110)
Creat: 0.66 mg/dL (ref 0.50–0.97)
Globulin: 3.5 g/dL (calc) (ref 1.9–3.7)
Glucose, Bld: 76 mg/dL (ref 65–99)
Potassium: 4.2 mmol/L (ref 3.5–5.3)
Sodium: 136 mmol/L (ref 135–146)
Total Bilirubin: 0.3 mg/dL (ref 0.2–1.2)
Total Protein: 7.5 g/dL (ref 6.1–8.1)
eGFR: 117 mL/min/{1.73_m2} (ref >=60)

## 2021-08-17 LAB — T-HELPER CELLS (CD4) COUNT (NOT AT ARMC)
Absolute CD4: 1154 cells/uL (ref 490–1740)
CD4 T Helper %: 42 % (ref 30–61)
Total lymphocyte count: 2734 cells/uL (ref 850–3900)

## 2021-08-17 LAB — HIV-1 RNA ULTRAQUANT REFLEX TO GENTYP+
HIV 1 RNA Quant: 34100 copies/mL — ABNORMAL HIGH
HIV-1 RNA Quant, Log: 4.53 Log copies/mL — ABNORMAL HIGH

## 2021-08-17 LAB — HIV-1 GENOTYPE

## 2021-08-18 ENCOUNTER — Telehealth: Payer: Self-pay

## 2021-08-18 NOTE — Telephone Encounter (Signed)
I have attempted without success to contact this patient by phone to will try again later if patient does not read Mychart message.  ? ?Patient will need HIV 1 Genotype previous order canceled by [facility].  ?Diamond Burton ? ?

## 2021-08-23 ENCOUNTER — Encounter: Payer: Self-pay | Admitting: Infectious Diseases

## 2021-09-14 ENCOUNTER — Other Ambulatory Visit: Payer: Self-pay

## 2021-09-14 ENCOUNTER — Ambulatory Visit: Payer: Medicaid Other | Admitting: Infectious Diseases

## 2021-09-14 ENCOUNTER — Encounter: Payer: Self-pay | Admitting: Infectious Diseases

## 2021-09-14 ENCOUNTER — Ambulatory Visit (INDEPENDENT_AMBULATORY_CARE_PROVIDER_SITE_OTHER): Payer: Medicaid Other | Admitting: Infectious Diseases

## 2021-09-14 VITALS — BP 134/75 | HR 85 | Temp 98.4°F | Wt 218.0 lb

## 2021-09-14 DIAGNOSIS — Z8619 Personal history of other infectious and parasitic diseases: Secondary | ICD-10-CM

## 2021-09-14 DIAGNOSIS — L600 Ingrowing nail: Secondary | ICD-10-CM | POA: Diagnosis not present

## 2021-09-14 DIAGNOSIS — Z21 Asymptomatic human immunodeficiency virus [HIV] infection status: Secondary | ICD-10-CM

## 2021-09-14 MED ORDER — GENVOYA 150-150-200-10 MG PO TABS: 1.0000 | ORAL_TABLET | Freq: Every day | ORAL | 5 refills | Status: DC

## 2021-09-14 NOTE — Patient Instructions (Addendum)
I do think you need to see a podiatrist to help with your toe nails - I can put a referral in for you today.  ? ?Will refill your Genvoya and repeat your labs today. Continue to take this everyday like you have been with food.  ? ?Would like to see you back in 3 months  ?

## 2021-09-14 NOTE — Assessment & Plan Note (Signed)
She is doing well on Genvoya - possible skin rashes but unclear if due to medication or alternative. It has calmed down with topical creams.  ?Chest pains sound like they have been ongoing off and on since steroid injections in the past.  ?She wants to continue the medication. Taking it correctly with food everyday and has not missed any doses.  ?Will repeat VL to see therapeutic response. Reviewed timeline of likely infection sometime last fall/winter.  ?RTC in 17m.  ?

## 2021-09-14 NOTE — Assessment & Plan Note (Signed)
Repeat titer today - may have low serofast titer. She understands.  ?

## 2021-09-14 NOTE — Progress Notes (Signed)
? ?Name: Diamond Burton  ?DOB: Aug 05, 1985 ?MRN: [id number] ?PCP: [facility]  ? ? ?Brief Narrative:  ?Diamond Burton is a 36 y.o. female with HIV disease, just diagnosed 07/2021. ?CD4 nadir pending ?VL pending ?HIV Risk: sexual ?History of OIs: none ?Intake Labs pending: ?Hep B sAg (-), sAb (), cAb (); Hep A (), Hep C () ?Quantiferon () ?HLA B*5701 () ?G6PD: () ? ? ?Previous Regimens: ?naive ? ?Genotypes: ?pending ? ?Subjective:  ? ?Chief Complaint  ?Patient presents with  ? Follow-up  ?  ? ?HPI: ?Diamond Burton is here today for her first visit for HIV care. She has had some side effects to her medication she thinks - noticed a rash to her face. Chest pains and tingling on hands/toes. Sometimes shooting pains in the hands and feet. She has noticed chest pains in the past also with steroid injections to HS lesions under her arms.  ?Has a h/o nerve palsy in left arm. ?Worries she needs a podiatrist - has very ingrown toenails on the right foot that is bothering her greatly.  ? ? ?  07/27/2021  ?  2:10 PM  ?Depression screen PHQ 2/9  ?Decreased Interest 1  ?Down, Depressed, Hopeless 1  ?PHQ - 2 Score 2  ?Altered sleeping 3  ?Tired, decreased energy 3  ?Change in appetite 0  ?Feeling bad or failure about yourself  1  ?Trouble concentrating 1  ?Moving slowly or fidgety/restless 1  ?Suicidal thoughts 0  ?PHQ-9 Score 11  ?Difficult doing work/chores Somewhat difficult  ? ? ?Review of Systems  ?Constitutional:  Negative for chills, fever, malaise/fatigue and weight loss.  ?HENT:  Negative for sore throat.   ?Respiratory:  Negative for cough and sputum production.   ?Cardiovascular:  Positive for chest pain. Negative for leg swelling.  ?Gastrointestinal:  Negative for abdominal pain, diarrhea and vomiting.  ?Genitourinary:  Negative for dysuria and flank pain.  ?Musculoskeletal:  Negative for joint pain, myalgias and neck pain.  ?Skin:  Positive for rash (papular rash that itches on chest).  ?Neurological:  Negative for  dizziness, tingling and headaches.  ?Psychiatric/Behavioral:  Negative for depression and substance abuse. The patient is not nervous/anxious and does not have insomnia.   ?All other systems reviewed and are negative. ? ? ?Past Medical History:  ?Diagnosis Date  ? Abnormal Pap smear   ? Bipolar disorder ([place])   ? not taking meds due to preg  ? Chlamydia infection affecting pregnancy in first trimester 10/10/2018  ? Depression   ? stopped meds, was wanting to see counselor. 11/8 "has been difficult since off meds"  ? Eczema   ? Erb's palsy   ? L arm  ? Erb's palsy   ? left arm  ? Gonorrhea affecting pregnancy in first trimester 10/10/2018  ? Group B streptococcal bacteriuria 10/18/2018  ? <1K colonies: Will need intrapartum prophylaxis  ? Herpes   ? frequent outbreaks on valtrex 500  ? History of chlamydia   ? 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 left  ? History of gonorrhea   ? Hydradenitis   ? Infection   ? UTI  ? Kidney stones   ? Ovarian cyst   ? Periodontal disease   ? PID (pelvic inflammatory disease)   ? PTSD (post-traumatic stress disorder)   ? Trichomonas contact   ? ? ?Outpatient Medications Prior to Visit  ?Medication Sig Dispense Refill  ? ibuprofen (ADVIL) 800 MG tablet Take 1 tablet (800 mg total) by mouth every 8 (eight)  hours as needed. 30 tablet 5  ? metroNIDAZOLE (FLAGYL) 500 MG tablet Take 1 tablet (500 mg total) by mouth 3 (three) times daily. 90 tablet 1  ? Prenat-Fe Poly-Methfol-FA-DHA (VITAFOL FE+) 90-0.6-0.4-200 MG CAPS Take 1 capsule by mouth daily before breakfast. 90 capsule 4  ? valACYclovir (VALTREX) 500 MG tablet Take 500 mg by mouth 2 (two) times daily.    ? elvitegravir-cobicistat-emtricitabine-tenofovir (GENVOYA) 150-150-200-10 MG TABS tablet Take 1 tablet by mouth daily with breakfast. 30 tablet 2  ? acetaminophen (TYLENOL) 325 MG tablet Take 2 tablets (650 mg total) by mouth every 4 (four) hours as needed (for pain scale < 4). (Patient not taking: Reported on 06/08/2021)    ?  fluconazole (DIFLUCAN) 150 MG tablet Take 1 tablet (150 mg total) by mouth daily. Repeat in 24 hours if needed (Patient not taking: Reported on 07/21/2021) 2 tablet 2  ? PARoxetine (PAXIL) 20 MG tablet Take 20 mg by mouth daily. (Patient not taking: Reported on 07/21/2021)    ? ?No facility-administered medications prior to visit.  ?  ? ?Allergies  ?Allergen Reactions  ? Latex   ? ? ?Social History  ? ?Tobacco Use  ? Smoking status: Former  ?  Packs/day: 0.50  ?  Years: 2.00  ?  Pack years: 1.00  ?  Types: Cigarettes  ? Smokeless tobacco: Never  ?Vaping Use  ? Vaping Use: Never used  ?Substance Use Topics  ? Alcohol use: Yes  ?  Comment: Occ  ? Drug use: No  ? ? ?Family History  ?Problem Relation Age of Onset  ? Depression Mother   ? Diabetes Mother   ? Hypertension Mother   ? Heart disease Mother   ? Asthma Sister   ? Asthma Daughter   ? Hypertension Maternal Grandmother   ? Diabetes Maternal Grandmother   ? ? ?Social History  ? ?Substance and Sexual Activity  ?Sexual Activity Yes  ? Partners: Male  ? Birth control/protection: None  ? ? ? ?Objective:  ? ?Vitals:  ? 09/14/21 1450  ?BP: 134/75  ?Pulse: 85  ?Temp: 98.4 ?F (36.9 ?C)  ?TempSrc: Oral  ?Weight: 218 lb (98.9 kg)  ? ?Body mass index is 36.28 kg/m?. ? ?Physical Exam ?Vitals reviewed.  ?Constitutional:   ?   Appearance: Normal appearance. She is not ill-appearing.  ?Cardiovascular:  ?   Rate and Rhythm: Normal rate.  ?Pulmonary:  ?   Effort: Pulmonary effort is normal.  ?Musculoskeletal:     ?   General: Normal range of motion.  ?Skin: ?   General: Skin is warm and dry.  ?   Comments: Small papular rash to the chest. Very faint and scattered  ?Neurological:  ?   Mental Status: She is oriented to person, place, and time.  ?Psychiatric:     ?   Attention and Perception: Attention normal.     ?   Mood and Affect: Mood is anxious. Affect is tearful.  ? ? ?Lab Results ?Lab Results  ?Component Value Date  ? WBC 6.0 07/27/2021  ? HGB 14.5 07/27/2021  ? HCT 43.9  07/27/2021  ? MCV 87.3 07/27/2021  ? PLT 287 07/27/2021  ?  ?Lab Results  ?Component Value Date  ? CREATININE 0.66 07/27/2021  ? BUN 11 07/27/2021  ? NA 136 07/27/2021  ? K 4.2 07/27/2021  ? CL 103 07/27/2021  ? CO2 26 07/27/2021  ?  ?Lab Results  ?Component Value Date  ? ALT 14 07/27/2021  ? AST 14  07/27/2021  ? ALKPHOS 92 04/20/2021  ? BILITOT 0.3 07/27/2021  ?  ?No results found for: CHOL, HDL, LDLCALC, LDLDIRECT, TRIG, CHOLHDL ?HIV 1 RNA Quant (copies/mL)  ?Date Value  ?07/27/2021 34,100 (H)  ? ? ? ?Assessment & Plan:  ? ?Problem List Items Addressed This Visit   ? ?  ? Unprioritized  ? History of syphilis  ?  Repeat titer today - may have low serofast titer. She understands.  ?  ?  ? Relevant Orders  ? RPR  ? HIV (human immunodeficiency virus infection) ([place]) - Primary  ?  She is doing well on Genvoya - possible skin rashes but unclear if due to medication or alternative. It has calmed down with topical creams.  ?Chest pains sound like they have been ongoing off and on since steroid injections in the past.  ?She wants to continue the medication. Taking it correctly with food everyday and has not missed any doses.  ?Will repeat VL to see therapeutic response. Reviewed timeline of likely infection sometime last fall/winter.  ?RTC in 18m.  ?  ?  ? Relevant Medications  ? elvitegravir-cobicistat-emtricitabine-tenofovir (GENVOYA) 150-150-200-10 MG TABS tablet  ? ?Other Visit Diagnoses   ? ? Ingrown toenail of right foot      ? Relevant Orders  ? Ambulatory referral to Podiatry  ? ?  ? ?Janene Madeira, MSN, NP-C ?[place] for Infectious Disease ?[facility] ?Pager: [phone] ?Office: [phone] ? ?09/14/21  ?4:18 PM ? ? ?

## 2021-09-16 LAB — RPR: RPR Ser Ql: NONREACTIVE

## 2021-09-16 LAB — HIV-1 RNA QUANT-NO REFLEX-BLD
HIV 1 RNA Quant: 61 Copies/mL — ABNORMAL HIGH
HIV-1 RNA Quant, Log: 1.78 Log cps/mL — ABNORMAL HIGH

## 2021-11-10 ENCOUNTER — Telehealth: Payer: Self-pay

## 2021-11-10 DIAGNOSIS — N9489 Other specified conditions associated with female genital organs and menstrual cycle: Secondary | ICD-10-CM | POA: Insufficient documentation

## 2021-11-10 NOTE — Progress Notes (Unsigned)
Name: Diamond Burton  DOB: May 05, 1986 MRN: [id number] [facility]CP: [facility]    Brief Narrative:  Diamond Burton is a 36 y.o. female with HIV disease, just diagnosed 07/2021. CD4 nadir pending VL pending HIV Risk: sexual History of OIs: none Intake Labs pending: Hep B sAg (-), sAb (), cAb (); Hep A (), Hep C () Quantiferon () HLA B*5701 () G6PD: ()   Previous Regimens: naive  Genotypes: pending  Subjective:   No chief complaint on file.    HPI: Diamond Burton is here today for acute visit. She called yesterday and was note feeling quite right. Has had some crampy abdominal pains associated menstrual cycle.   ***     07/27/2021    2:10 PM  Depression screen PHQ 2/9  Decreased Interest 1  Down, Depressed, Hopeless 1  PHQ - 2 Score 2  Altered sleeping 3  Tired, decreased energy 3  Change in appetite 0  Feeling bad or failure about yourself  1  Trouble concentrating 1  Moving slowly or fidgety/restless 1  Suicidal thoughts 0  PHQ-9 Score 11  Difficult doing work/chores Somewhat difficult    Review of Systems  Constitutional:  Negative for chills, fever, malaise/fatigue and weight loss.  HENT:  Negative for sore throat.   Respiratory:  Negative for cough and sputum production.   Cardiovascular:  Positive for chest pain. Negative for leg swelling.  Gastrointestinal:  Negative for abdominal pain, diarrhea and vomiting.  Genitourinary:  Negative for dysuria and flank pain.  Musculoskeletal:  Negative for joint pain, myalgias and neck pain.  Skin:  Positive for rash (papular rash that itches on chest).  Neurological:  Negative for dizziness, tingling and headaches.  Psychiatric/Behavioral:  Negative for depression and substance abuse. The patient is not nervous/anxious and does not have insomnia.   All other systems reviewed and are negative.   Past Medical History:  Diagnosis Date   Abnormal Pap smear    Bipolar disorder (HCC)    not taking meds due to  preg   Chlamydia infection affecting pregnancy in first trimester 10/10/2018   Depression    stopped meds, was wanting to see counselor. 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 (Patient not taking: Reported on 06/08/2021)     elvitegravir-cobicistat-emtricitabine-tenofovir (GENVOYA) 150-150-200-10 MG TABS tablet Take 1 tablet by mouth daily with breakfast. 30 tablet 5   fluconazole (DIFLUCAN) 150 MG tablet Take 1 tablet (150 mg total) by mouth daily. Repeat in 24 hours if needed (Patient not taking: Reported on 07/21/2021) 2 tablet 2  ibuprofen (ADVIL) 800 MG tablet Take 1 tablet (800 mg total) by mouth every 8 (eight) hours as needed. 30 tablet 5   metroNIDAZOLE (FLAGYL) 500 MG tablet Take 1 tablet (500 mg total) by mouth 3 (three) times daily. 90 tablet 1   PARoxetine (PAXIL) 20 MG tablet Take 20 mg by mouth daily. (Patient not taking: Reported on 07/21/2021)     Prenat-Fe Poly-Methfol-FA-DHA (VITAFOL FE+) 90-0.6-0.4-200 MG CAPS Take 1 capsule by mouth daily before breakfast. 90 capsule 4   valACYclovir (VALTREX) 500 MG tablet Take 500 mg by mouth 2 (two) times daily.     No facility-administered medications prior to visit.      Allergies  Allergen Reactions   Latex     Social History   Tobacco Use   Smoking status: Former    Packs/day: 0.50    Years: 2.00    Pack years: 1.00    Types: Cigarettes   Smokeless tobacco: Never  Vaping Use   Vaping Use: Never used  Substance Use Topics   Alcohol use: Yes    Comment: Occ   Drug use: No    Family History  Problem Relation Age of Onset   Depression Mother    Diabetes Mother    Hypertension Mother    Heart disease Mother    Asthma Sister    Asthma Daughter    Hypertension Maternal Grandmother    Diabetes Maternal Grandmother     Social History   Substance and Sexual Activity  Sexual Activity Yes   Partners: Male   Birth control/protection: None     Objective:   There were no vitals filed for this visit.  There is no height or weight on file to calculate BMI.  Physical Exam Vitals reviewed.  Constitutional:      Appearance: Normal appearance. She is not ill-appearing.  Cardiovascular:     Rate and Rhythm: Normal rate.  Pulmonary:     Effort: Pulmonary effort is normal.  Musculoskeletal:        General: Normal range of motion.  Skin:    General: Skin is warm and dry.     Comments: Small papular rash to the chest. Very faint and scattered  Neurological:     Mental Status: She is oriented to person, place, and time.  Psychiatric:        Attention and Perception: Attention normal.        Mood and Affect: Mood is anxious. Affect is tearful.    Lab Results Lab Results  Component Value Date   WBC 6.0 07/27/2021   HGB 14.5 07/27/2021   HCT 43.9 07/27/2021   MCV 87.3 07/27/2021   PLT 287 07/27/2021    Lab Results  Component Value Date   CREATININE 0.66 07/27/2021   BUN 11 07/27/2021   NA 136 07/27/2021   K 4.2 07/27/2021   CL 103 07/27/2021   CO2 26 07/27/2021    Lab Results  Component Value Date   ALT 14 07/27/2021   AST 14 07/27/2021   ALKPHOS 92 04/20/2021   BILITOT 0.3 07/27/2021    No results found for:  CHOL, HDL, LDLCALC, LDLDIRECT, TRIG, CHOLHDL HIV 1 RNA Quant  Date Value  09/14/2021 61 Copies/mL (H)  07/27/2021 34,100 copies/mL (H)     Assessment & Plan:   Problem List Items Addressed This Visit   None Rexene Alberts, MSN, NP-C [facility] Pager: [phone] Office: [phone]  11/10/21  4:48 PM

## 2021-11-10 NOTE — Telephone Encounter (Signed)
Patient called requesting an earlier appointment with Diamond Burton. States she overall is just "not feeling well."   Asked patient what specific symptoms she is having, she reports increased cramping with her period and wants to be "retested for everything."  Asked if she was concerned about possible STI, she states she is afraid the syphilis has come back, but wants to discuss things with Diamond Burton and be tested for whatever she recommends. She accepts appointment for tomorrow morning.   Sandie Ano, RN

## 2021-11-11 ENCOUNTER — Encounter: Payer: Self-pay | Admitting: Infectious Diseases

## 2021-11-11 ENCOUNTER — Other Ambulatory Visit (HOSPITAL_COMMUNITY)
Admission: RE | Admit: 2021-11-11 | Discharge: 2021-11-11 | Disposition: A | Discharge status: Home / Self Care | Payer: Medicaid Other | Source: Ambulatory Visit | Attending: Infectious Diseases | Admitting: Infectious Diseases

## 2021-11-11 ENCOUNTER — Other Ambulatory Visit: Payer: Self-pay

## 2021-11-11 ENCOUNTER — Ambulatory Visit: Payer: Medicaid Other | Admitting: Infectious Diseases

## 2021-11-11 VITALS — BP 143/81 | HR 76 | Temp 97.5°F | Ht 66.0 in | Wt 215.0 lb

## 2021-11-11 DIAGNOSIS — N939 Abnormal uterine and vaginal bleeding, unspecified: Secondary | ICD-10-CM | POA: Diagnosis not present

## 2021-11-11 DIAGNOSIS — N9489 Other specified conditions associated with female genital organs and menstrual cycle: Secondary | ICD-10-CM

## 2021-11-11 DIAGNOSIS — Z8619 Personal history of other infectious and parasitic diseases: Secondary | ICD-10-CM

## 2021-11-11 LAB — URINE CYTOLOGY ANCILLARY ONLY
Chlamydia: NEGATIVE
Comment: NEGATIVE
Comment: NEGATIVE
Comment: NORMAL
Neisseria Gonorrhea: NEGATIVE
Trichomonas: NEGATIVE

## 2021-11-11 NOTE — Assessment & Plan Note (Signed)
Re-test today for reassurance.

## 2021-11-11 NOTE — Assessment & Plan Note (Signed)
Suspicious for fibroids given her description of bloating and pain a/w heavy menses. Other differential PID, endometritis (though the pain has resolved and recent bloody discharge did not have odor to it).  UPT today. Urine cytology. Self collected vaginal swab.  Will also check TSH, A1c today.  Recommend she schedule a visit with her GYN team to investigate non-infectious causes of abnormal uterine bleeding.

## 2021-11-11 NOTE — Patient Instructions (Signed)
I will let you know what your lab tests show  I think you should make an appointment with Dr. Jodi Mourning to look into the changes with your bleeding we discussed.

## 2021-11-12 ENCOUNTER — Telehealth: Payer: Self-pay

## 2021-11-12 DIAGNOSIS — B3731 Acute candidiasis of vulva and vagina: Secondary | ICD-10-CM

## 2021-11-12 LAB — HEMOGLOBIN A1C
Hgb A1c MFr Bld: 5.3 % of total Hgb (ref <5.7)
Mean Plasma Glucose: 105 mg/dL
eAG (mmol/L): 5.8 mmol/L

## 2021-11-12 LAB — CERVICOVAGINAL ANCILLARY ONLY
Bacterial Vaginitis (gardnerella): NEGATIVE
Candida Glabrata: NEGATIVE
Candida Vaginitis: POSITIVE — AB
Chlamydia: NEGATIVE
Comment: NEGATIVE
Comment: NEGATIVE
Comment: NEGATIVE
Comment: NEGATIVE
Comment: NEGATIVE
Comment: NORMAL
Neisseria Gonorrhea: NEGATIVE
Trichomonas: NEGATIVE

## 2021-11-12 LAB — RPR: RPR Ser Ql: NONREACTIVE

## 2021-11-12 LAB — TSH+FREE T4: TSH W/REFLEX TO FT4: 0.76 mIU/L

## 2021-11-12 MED ORDER — FLUCONAZOLE 150 MG PO TABS: 150.0000 mg | ORAL_TABLET | Freq: Every day | ORAL | 0 refills | Status: DC

## 2021-11-12 NOTE — Telephone Encounter (Signed)
Spoke with patient, relayed that she has a vaginal yeast infection. Diamond Burton states this is common for her. Explained that [name] does not think this is causing her bleeding, but it may be contributing to her symptoms of discharge.   Relayed that diflucan will be sent to the pharmacy for her and advised that she follow up with gynecology to discuss the menstrual concerns she has. Patient verbalized understanding and has no further questions.   Sandie Ano, RN

## 2021-11-12 NOTE — Telephone Encounter (Signed)
-----   Message from Blanchard Kelch, NP sent at 11/12/2021  1:35 PM EDT ----- Will you please call tiara to let her know that she has a vaginal yeast infection. This is a very common thing that many women women go through totally unrelated to any sexual transmitted infections. I do not think it's causing her bleeding, but it is certainly causing her discharge and other symptoms we talked about. Please send in Diflucan 150 mg tablets daily times 3 doses to treat this for her.   I do still think she should follow up with gynecology like we discussed yesterday to talk more about the menstrual symptoms she had

## 2021-12-16 ENCOUNTER — Ambulatory Visit: Payer: Medicaid Other | Admitting: Infectious Diseases

## 2022-01-04 ENCOUNTER — Encounter: Payer: Self-pay | Admitting: Infectious Diseases

## 2022-01-04 ENCOUNTER — Ambulatory Visit (INDEPENDENT_AMBULATORY_CARE_PROVIDER_SITE_OTHER): Payer: Medicaid Other | Admitting: Infectious Diseases

## 2022-01-04 ENCOUNTER — Other Ambulatory Visit: Payer: Self-pay

## 2022-01-04 VITALS — BP 116/80 | Resp 16 | Ht 66.0 in | Wt 217.0 lb

## 2022-01-04 DIAGNOSIS — Z21 Asymptomatic human immunodeficiency virus [HIV] infection status: Secondary | ICD-10-CM | POA: Diagnosis present

## 2022-01-04 MED ORDER — GENVOYA 150-150-200-10 MG PO TABS: 1.0000 | ORAL_TABLET | Freq: Every day | ORAL | 11 refills | Status: DC

## 2022-01-04 NOTE — Progress Notes (Signed)
Name: Diamond Burton  DOB: [birth year] MRN: [id number] PCP: [facility]    Brief Narrative:  Diamond Burton is a 36 y.o. female with HIV disease, diagnosed 07/2021. CD4 nadir pending VL pending HIV Risk: sexual History of OIs: none Intake Labs pending: Hep B sAg (-), sAb (), cAb (); Hep A (), Hep C (-) Quantiferon () HLA B*5701 () G6PD: ()   Previous Regimens: Genvoya 2023  Genotypes: 07/2021 sensitive    Subjective:   Chief Complaint  Patient presents with   Follow-up      HPI: Diamond Burton is here for routine follow up visit.   She has had a lot of traumatic events over the last few months that have been very stressful for her impacting her sleep and safety. She has moved out of her house to a safer area for the moment and planning on next steps for her and her family. Despite these events she has not missed any doses of her Genvoya - may have had some delayed doses but none missed.       01/04/2022    4:03 PM  Depression screen PHQ 2/9  Decreased Interest 0  Down, Depressed, Hopeless 0  PHQ - 2 Score 0    Review of Systems  Constitutional:  Negative for appetite change, chills, fatigue, fever and unexpected weight change.  HENT:  Negative for mouth sores, sore throat and trouble swallowing.   Eyes:  Negative for pain and visual disturbance.  Respiratory:  Negative for cough and shortness of breath.   Cardiovascular:  Negative for chest pain.  Gastrointestinal:  Negative for abdominal pain, diarrhea and nausea.  Genitourinary:  Negative for dysuria, menstrual problem and pelvic pain.  Musculoskeletal:  Negative for back pain and neck pain.  Skin:  Negative for color change and rash.  Neurological:  Negative for weakness, numbness and headaches.  Hematological:  Negative for adenopathy.  Psychiatric/Behavioral:  Positive for sleep disturbance. Negative for dysphoric mood. The patient is nervous/anxious.       Past Medical History:   Diagnosis Date   Abnormal Pap smear    Bipolar disorder ([place])    not taking meds due to preg   Chlamydia infection affecting pregnancy in first trimester 10/10/2018   Depression    stopped meds, was wanting to see counselor. 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     fluconazole (DIFLUCAN) 150 MG tablet Take 1 tablet (150 mg total) by mouth daily. Repeat in 24 hours if needed 2 tablet 2   fluconazole (DIFLUCAN) 150 MG  tablet Take 1 tablet (150 mg total) by mouth daily. 3 tablet 0   ibuprofen (ADVIL) 800 MG tablet Take 1 tablet (800 mg total) by mouth every 8 (eight) hours as needed. 30 tablet 5   PARoxetine (PAXIL) 20 MG tablet Take 20 mg by mouth daily.     Prenat-Fe Poly-Methfol-FA-DHA (VITAFOL FE+) 90-0.6-0.4-200 MG CAPS Take 1 capsule by mouth daily before breakfast. 90 capsule 4   valACYclovir (VALTREX) 500 MG tablet Take 500 mg by mouth 2 (two) times daily.     ziprasidone (GEODON) 20 MG capsule Take by mouth.     elvitegravir-cobicistat-emtricitabine-tenofovir (GENVOYA) 150-150-200-10 MG TABS tablet Take 1 tablet by mouth daily with breakfast. 30 tablet 5   metroNIDAZOLE (FLAGYL) 500 MG tablet Take 1 tablet (500 mg total) by  mouth 3 (three) times daily. (Patient not taking: Reported on 01/04/2022) 90 tablet 1   No facility-administered medications prior to visit.     Allergies  Allergen Reactions   Latex     Social History   Tobacco Use   Smoking status: Former    Packs/day: 0.50    Years: 2.00    Total pack years: 1.00    Types: Cigarettes   Smokeless tobacco: Never  Vaping Use   Vaping Use: Never used  Substance Use Topics   Alcohol use: Yes    Comment: Occ   Drug use: No    Family History  Problem Relation Age of Onset   Depression Mother    Diabetes Mother    Hypertension Mother    Heart disease Mother    Asthma Sister    Asthma Daughter    Hypertension Maternal Grandmother    Diabetes Maternal Grandmother     Social History   Substance and Sexual Activity  Sexual Activity Yes   Partners: Male   Birth control/protection: None     Objective:   Vitals:   01/04/22 1603  BP: 116/80  Resp: 16  SpO2: 98%  Weight: 217 lb (98.4 kg)  Height: _0  (1.676 m)   Body mass index is 35.02 kg/m.  Physical Exam Constitutional:      Appearance: Normal appearance. She is not ill-appearing.  HENT:     Mouth/Throat:     Mouth: Mucous membranes are moist.     Pharynx: Oropharynx is clear.  Eyes:     General: No scleral icterus. Cardiovascular:     Rate and Rhythm: Normal rate.  Pulmonary:     Effort: Pulmonary effort is normal.  Neurological:     Mental Status: She is oriented to person, place, and time.  Psychiatric:        Mood and Affect: Mood normal.        Thought Content: Thought content normal.     Lab Results Lab Results  Component Value Date   WBC 6.0 07/27/2021   HGB 14.5 07/27/2021   HCT 43.9 07/27/2021   MCV 87.3 07/27/2021   PLT 287 07/27/2021    Lab Results  Component Value Date   CREATININE 0.66 07/27/2021   BUN 11 07/27/2021   NA 136 07/27/2021   K 4.2 07/27/2021   CL 103 07/27/2021   CO2 26 07/27/2021    Lab Results  Component Value Date    ALT 14 07/27/2021   AST 14 07/27/2021   ALKPHOS 92 04/20/2021   BILITOT 0.3 07/27/2021    No results found for: "CHOL", "HDL", "LDLCALC", "LDLDIRECT", "TRIG", "CHOLHDL" HIV 1 RNA Quant  Date Value  09/14/2021 61  Copies/mL (H)  07/27/2021 34,100 copies/mL (H)     Assessment & Plan:   Problem List Items Addressed This Visit       Unprioritized   HIV (human immunodeficiency virus infection) ([place])    Very well controlled on once daily Genvoya. No concerns with access or adherence to medication. They are tolerating the medication well without side effects. No drug interactions identified. She may switch her pharmacy to something closer to where she lives - still planning this out. VL and CD4 count today. Hep B sAb today to see if she needs booster vaccine.  She plans to follow up with her psychiatrist about recent traumatic events. Has church/community support she is thankful for.  Sexual health discussed including screening needs. None identified today.   Return in about 4 months (around 05/07/2022). OK to space out to 6 months if she is comfortable with this and is easier with her transportation.        Relevant Medications   elvitegravir-cobicistat-emtricitabine-tenofovir (GENVOYA) 150-150-200-10 MG TABS tablet   Other Visit Diagnoses     HIV test positive ([place])    -  Primary   Relevant Medications   elvitegravir-cobicistat-emtricitabine-tenofovir (GENVOYA) 150-150-200-10 MG TABS tablet   Other Relevant Orders   Hepatitis B surface antibody,qualitative   HIV 1 RNA quant-no reflex-bld   T-helper cells (CD4) count      Janene Madeira, MSN, NP-C [facility] [place] Pager: [phone] Office: [phone]  01/04/22  5:02 PM

## 2022-01-04 NOTE — Assessment & Plan Note (Signed)
Very well controlled on once daily Genvoya. No concerns with access or adherence to medication. They are tolerating the medication well without side effects. No drug interactions identified. She may switch her pharmacy to something closer to where she lives - still planning this out. VL and CD4 count today. Hep B sAb today to see if she needs booster vaccine.  She plans to follow up with her psychiatrist about recent traumatic events. Has church/community support she is thankful for.  Sexual health discussed including screening needs. None identified today.   Return in about 4 months (around 05/07/2022). OK to space out to 6 months if she is comfortable with this and is easier with her transportation.

## 2022-01-04 NOTE — Patient Instructions (Addendum)
Would like to see you back sometime in 3-6 months - whatever you can do with the transportation.   If you can find a pharmacy closer to the [place] that is closer to you to drive to to get your medication.   Will let you know how your labs look on your MYChart.

## 2022-01-05 LAB — T-HELPER CELLS (CD4) COUNT (NOT AT ARMC)
CD4 % Helper T Cell: 54 % (ref 33–65)
CD4 T Cell Abs: 1331 /uL (ref 400–1790)

## 2022-01-07 LAB — HEPATITIS B SURFACE ANTIBODY,QUALITATIVE: Hep B S Ab: NONREACTIVE

## 2022-01-07 LAB — HIV-1 RNA QUANT-NO REFLEX-BLD
HIV 1 RNA Quant: NOT DETECTED Copies/mL
HIV-1 RNA Quant, Log: NOT DETECTED Log cps/mL

## 2022-01-16 ENCOUNTER — Other Ambulatory Visit: Payer: Self-pay | Admitting: Obstetrics and Gynecology

## 2022-01-25 ENCOUNTER — Ambulatory Visit: Payer: Medicaid Other | Admitting: Obstetrics and Gynecology

## 2022-02-18 ENCOUNTER — Ambulatory Visit: Payer: Medicaid Other | Admitting: Obstetrics and Gynecology

## 2022-02-18 ENCOUNTER — Other Ambulatory Visit (HOSPITAL_COMMUNITY)
Admission: RE | Admit: 2022-02-18 | Discharge: 2022-02-18 | Disposition: A | Discharge status: Home / Self Care | Payer: Medicaid Other | Source: Ambulatory Visit | Attending: Obstetrics and Gynecology | Admitting: Obstetrics and Gynecology

## 2022-02-18 ENCOUNTER — Encounter: Payer: Self-pay | Admitting: Obstetrics and Gynecology

## 2022-02-18 VITALS — BP 101/64 | HR 86 | Ht 65.0 in | Wt 224.0 lb

## 2022-02-18 DIAGNOSIS — N643 Galactorrhea not associated with childbirth: Secondary | ICD-10-CM

## 2022-02-18 DIAGNOSIS — N898 Other specified noninflammatory disorders of vagina: Secondary | ICD-10-CM | POA: Diagnosis present

## 2022-02-18 DIAGNOSIS — N939 Abnormal uterine and vaginal bleeding, unspecified: Secondary | ICD-10-CM

## 2022-02-18 NOTE — Progress Notes (Signed)
  CC: leaking breasts Subjective:    Patient ID: Diamond Burton, female    DOB: September 07, 1985, 35 y.o.   MRN: [id number]  HPI 36 yo G6P4, SVD x 4 seen for approximately 1 month history of abnormal lactation.  She has also had irregular menses from [place] to July where she would have 2 menses per month.  August was a normal cycle and she has not had her menstruation yet this month.  Pt is unsure if her new HIV medication could have caused the new lactation.   Review of Systems     Objective:   Physical Exam Vitals:   02/18/22 0954  BP: 101/64  Pulse: 86   Breasts: bilateral expression of milk from each nipple, no masses palpated Bimanual exam: normal sized uterus with normal adnexa      Assessment & Plan:   1. 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 Unsure if this is a medication reaction or abnormal expression of prolactin, will check labs and will order imaging if necessary  - Thyroid Panel With TSH - Prolactin - Beta hCG quant (ref lab) - Follicle stimulating hormone  2. Abnormal uterine bleeding (AUB) Reviewed previous ultrasounds and no hx of fibroids noted Bimanual exam essentially normal Consider OCP if galactorrhea labs normal  3. Vaginal itching Vaginal swab per pt request  - Cervicovaginal ancillary only( [place])    Warden Fillers, MD Faculty Attending, [facility]

## 2022-02-18 NOTE — Progress Notes (Signed)
Patient presents for c/o irregular cycles since Feb 2023 and vaginal itching x 2weeks.  Has concerns for lactating breast and breast soreness. Had fetal demise in Nov 22 at around [redacted] weeks GA. Not currently using contraception, wants to discuss options.  Desires vaginal swab.

## 2022-02-19 LAB — THYROID PANEL WITH TSH
Free Thyroxine Index: 2.1 (ref 1.2–4.9)
T3 Uptake Ratio: 31 % (ref 24–39)
T4, Total: 6.7 ug/dL (ref 4.5–12.0)
TSH: 1.16 u[IU]/mL (ref 0.450–4.500)

## 2022-02-19 LAB — PROLACTIN: Prolactin: 119 ng/mL — ABNORMAL HIGH (ref 4.8–23.3)

## 2022-02-19 LAB — FOLLICLE STIMULATING HORMONE: FSH: 2.4 m[IU]/mL

## 2022-02-19 LAB — BETA HCG QUANT (REF LAB): hCG Quant: <1 m[IU]/mL

## 2022-02-21 ENCOUNTER — Other Ambulatory Visit: Payer: Self-pay | Admitting: Emergency Medicine

## 2022-02-21 ENCOUNTER — Other Ambulatory Visit: Payer: Self-pay | Admitting: Obstetrics and Gynecology

## 2022-02-21 ENCOUNTER — Encounter: Payer: Self-pay | Admitting: Emergency Medicine

## 2022-02-21 DIAGNOSIS — N643 Galactorrhea not associated with childbirth: Secondary | ICD-10-CM

## 2022-02-21 DIAGNOSIS — B3731 Acute candidiasis of vulva and vagina: Secondary | ICD-10-CM

## 2022-02-21 DIAGNOSIS — A599 Trichomoniasis, unspecified: Secondary | ICD-10-CM

## 2022-02-21 LAB — CERVICOVAGINAL ANCILLARY ONLY
Bacterial Vaginitis (gardnerella): NEGATIVE
Candida Glabrata: POSITIVE — AB
Candida Vaginitis: POSITIVE — AB
Chlamydia: NEGATIVE
Comment: NEGATIVE
Comment: NEGATIVE
Comment: NEGATIVE
Comment: NEGATIVE
Comment: NEGATIVE
Comment: NORMAL
Neisseria Gonorrhea: NEGATIVE
Trichomonas: POSITIVE — AB

## 2022-02-21 MED ORDER — FLUCONAZOLE 150 MG PO TABS: 150.0000 mg | ORAL_TABLET | Freq: Once | ORAL | 0 refills | Status: AC

## 2022-02-21 MED ORDER — METRONIDAZOLE 500 MG PO TABS: 500.0000 mg | ORAL_TABLET | Freq: Two times a day (BID) | ORAL | 0 refills | Status: DC

## 2022-02-21 NOTE — Progress Notes (Signed)
Rx sent to pharmacy   

## 2022-02-21 NOTE — Progress Notes (Signed)
Referral placed for endocrinology

## 2022-03-07 ENCOUNTER — Ambulatory Visit
Admission: EM | Admit: 2022-03-07 | Discharge: 2022-03-07 | Disposition: A | Discharge status: Home / Self Care | Payer: Medicaid Other | Attending: Physician Assistant | Admitting: Physician Assistant

## 2022-03-07 DIAGNOSIS — Z113 Encounter for screening for infections with a predominantly sexual mode of transmission: Secondary | ICD-10-CM | POA: Insufficient documentation

## 2022-03-07 NOTE — ED Triage Notes (Signed)
Pt c/o abd cramping. Requesting tests for trich, gonorrhea, chlamydia.

## 2022-03-07 NOTE — ED Provider Notes (Signed)
[facility]    CSN: [id number] Arrival date & time: 03/07/22  0865      History   Chief Complaint Chief Complaint  Patient presents with   sti screening    HPI Diamond Burton is a 36 y.o. female.   Patient here today for STD screening.  She reports her only symptom is some lower abdominal discomfort but she denies any dysuria, vaginal discharge or vaginal itching.  She has not had any genital lesions or rashes.  She has screening for STDs via blood work on a regular basis and declined same today.  The history is provided by the patient.    Past Medical History:  Diagnosis Date   Abnormal Pap smear    Bipolar disorder (HCC)    not taking meds due to preg   Chlamydia infection affecting pregnancy in first trimester 10/10/2018   Depression    stopped meds, was wanting to see counselor. 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   History of gonorrhea    Hydradenitis    Infection    UTI   Kidney stones    Ovarian cyst    Periodontal disease    PID (pelvic inflammatory disease)    PTSD (post-traumatic stress disorder)    Trichomonas contact     Patient Active Problem List   Diagnosis Date Noted   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 02/18/2022   Abnormal uterine bleeding (AUB) 11/11/2021   Uterine cramping 11/10/2021   HIV (human immunodeficiency virus infection) (HCC) 07/27/2021   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   Homicidal ideation 09/11/2018   Suicidal ideation 09/11/2018   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   History of herpes genitalis 06/01/2016   History of syphilis 06/01/2016   Hydradenitis     Past Surgical History:   Procedure Laterality Date   HYDRADENITIS EXCISION  03/2019   IRRIGATION AND DEBRIDEMENT ABSCESS Left 03/20/2019   Procedure: IRRIGATION AND DEBRIDEMENT BREAST ABSCESS;  Surgeon: Abigail Miyamoto, MD;  Location: [facility] OR;  Service: General;  Laterality: Left;   NO PAST SURGERIES      OB History     Gravida  6   Para  5   Term  4   Preterm  1   AB  1   Living  4      SAB  1   IAB      Ectopic      Multiple  0   Live Births  4            Home Medications    Prior to Admission medications   Medication Sig Start Date End Date Taking? Authorizing Provider  acetaminophen (TYLENOL) 325 MG tablet Take 2 tablets (650 mg total) by mouth every 4 (four) hours as needed (for pain scale < 4). Patient not taking: Reported on 02/18/2022 04/22/21   Lavon Bing, MD  elvitegravir-cobicistat-emtricitabine-tenofovir (GENVOYA) 150-150-200-10 MG TABS tablet Take 1 tablet by mouth daily with breakfast. 01/04/22   Blanchard Kelch, NP  fluconazole (DIFLUCAN) 150 MG tablet Take 1 tablet (150 mg total) by mouth  daily. Repeat in 24 hours if needed Patient not taking: Reported on 02/18/2022 06/09/21   Reva Bores, MD  fluconazole (DIFLUCAN) 150 MG tablet Take 1 tablet (150 mg total) by mouth daily. Patient not taking: Reported on 02/18/2022 11/12/21   Blanchard Kelch, NP  ibuprofen (ADVIL) 800 MG tablet Take 1 tablet (800 mg total) by mouth every 8 (eight) hours as needed. Patient not taking: Reported on 02/18/2022 05/20/21   Brock Bad, MD  metroNIDAZOLE (FLAGYL) 500 MG tablet Take 1 tablet (500 mg total) by mouth 3 (three) times daily. Patient not taking: Reported on 02/18/2022 07/27/21   Blanchard Kelch, NP  metroNIDAZOLE (FLAGYL) 500 MG tablet Take 1 tablet (500 mg total) by mouth 2 (two) times daily. 02/21/22   Warden Fillers, MD  PARoxetine (PAXIL) 20 MG tablet Take 20 mg by mouth daily.    [provider]  Prenat-Fe Poly-Methfol-FA-DHA (VITAFOL FE+) 90-0.6-0.4-200 MG  CAPS Take 1 capsule by mouth daily before breakfast. Patient not taking: Reported on 02/18/2022 05/20/21   Brock Bad, MD  valACYclovir (VALTREX) 500 MG tablet TAKE 1 TABLET BY MOUTH TWICE A DAY 01/16/22   Warden Fillers, MD  ziprasidone (GEODON) 20 MG capsule Take by mouth. 09/27/21   [provider]    Family History Family History  Problem Relation Age of Onset   Depression Mother    Diabetes Mother    Hypertension Mother    Heart disease Mother    Asthma Sister    Asthma Daughter    Hypertension Maternal Grandmother    Diabetes Maternal Grandmother     Social History Social History   Tobacco Use   Smoking status: Former    Packs/day: 0.50    Years: 2.00    Total pack years: 1.00    Types: Cigarettes   Smokeless tobacco: Never  Vaping Use   Vaping Use: Never used  Substance Use Topics   Alcohol use: Yes    Comment: Occ   Drug use: No     Allergies   Latex   Review of Systems Review of Systems  Constitutional:  Negative for chills and fever.  Eyes:  Negative for discharge and redness.  Gastrointestinal:  Positive for abdominal pain. Negative for nausea and vomiting.  Genitourinary:  Negative for dysuria, genital sores and vaginal discharge.     Physical Exam Triage Vital Signs ED Triage Vitals  Enc Vitals Group     BP      Pulse      Resp      Temp      Temp src      SpO2      Weight      Height      Head Circumference      Peak Flow      Pain Score      Pain Loc      Pain Edu?      Excl. in [place]?    No data found.  Updated Vital Signs BP 106/71 (BP Location: Left Arm)   Pulse 64   Temp 97.6 F (36.4 C) (Oral)   Resp 16   SpO2 98%   Breastfeeding No   Physical Exam Vitals and nursing note reviewed.  Constitutional:      General: She is not in acute distress.    Appearance: Normal appearance. She is not ill-appearing.  HENT:     Head: Normocephalic and atraumatic.  Eyes:     Conjunctiva/sclera:  Conjunctivae normal.   Cardiovascular:     Rate and Rhythm: Normal rate.  Pulmonary:     Effort: Pulmonary effort is normal.  Neurological:     Mental Status: She is alert.  Psychiatric:        Mood and Affect: Mood normal.        Behavior: Behavior normal.        Thought Content: Thought content normal.      UC Treatments / Results  Labs (all labs ordered are listed, but only abnormal results are displayed) Labs Reviewed  CERVICOVAGINAL ANCILLARY ONLY    EKG   Radiology No results found.  Procedures Procedures (including critical care time)  Medications Ordered in UC Medications - No data to display  Initial Impression / Assessment and Plan / UC Course  I have reviewed the triage vital signs and the nursing notes.  Pertinent labs & imaging results that were available during my care of the patient were reviewed by me and considered in my medical decision making (see chart for details).    STD screening ordered as requested.  Will await results for further recommendation.  Final Clinical Impressions(s) / UC Diagnoses   Final diagnoses:  Screening examination for STD (sexually transmitted disease)   Discharge Instructions   None    ED Prescriptions   None    PDMP not reviewed this encounter.   Francene Finders, PA-C 03/07/22 1243

## 2022-03-08 LAB — CERVICOVAGINAL ANCILLARY ONLY
Bacterial Vaginitis (gardnerella): NEGATIVE
Candida Glabrata: NEGATIVE
Candida Vaginitis: NEGATIVE
Chlamydia: NEGATIVE
Comment: NEGATIVE
Comment: NEGATIVE
Comment: NEGATIVE
Comment: NEGATIVE
Comment: NEGATIVE
Comment: NORMAL
Neisseria Gonorrhea: NEGATIVE
Trichomonas: NEGATIVE

## 2022-03-11 ENCOUNTER — Emergency Department (HOSPITAL_COMMUNITY)
Admission: EM | Admit: 2022-03-11 | Discharge: 2022-03-11 | Disposition: A | Discharge status: Home / Self Care | Payer: Medicaid Other | Attending: Emergency Medicine | Admitting: Emergency Medicine

## 2022-03-11 ENCOUNTER — Other Ambulatory Visit: Payer: Self-pay

## 2022-03-11 DIAGNOSIS — R1031 Right lower quadrant pain: Secondary | ICD-10-CM | POA: Diagnosis not present

## 2022-03-11 DIAGNOSIS — Z21 Asymptomatic human immunodeficiency virus [HIV] infection status: Secondary | ICD-10-CM | POA: Insufficient documentation

## 2022-03-11 DIAGNOSIS — Z91040 Latex allergy status: Secondary | ICD-10-CM | POA: Insufficient documentation

## 2022-03-11 DIAGNOSIS — N9489 Other specified conditions associated with female genital organs and menstrual cycle: Secondary | ICD-10-CM | POA: Insufficient documentation

## 2022-03-11 DIAGNOSIS — R109 Unspecified abdominal pain: Secondary | ICD-10-CM | POA: Diagnosis present

## 2022-03-11 LAB — CBC
HCT: 40.8 % (ref 36.0–46.0)
Hemoglobin: 13.7 g/dL (ref 12.0–15.0)
MCH: 31.2 pg (ref 26.0–34.0)
MCHC: 33.6 g/dL (ref 30.0–36.0)
MCV: 92.9 fL (ref 80.0–100.0)
Platelets: 266 10*3/uL (ref 150–400)
RBC: 4.39 MIL/uL (ref 3.87–5.11)
RDW: 13.1 % (ref 11.5–15.5)
WBC: 8.8 10*3/uL (ref 4.0–10.5)
nRBC: 0 % (ref 0.0–0.2)

## 2022-03-11 LAB — URINALYSIS, ROUTINE W REFLEX MICROSCOPIC
Bilirubin Urine: NEGATIVE
Glucose, UA: NEGATIVE mg/dL
Hgb urine dipstick: NEGATIVE
Ketones, ur: NEGATIVE mg/dL
Leukocytes,Ua: NEGATIVE
Nitrite: NEGATIVE
Protein, ur: NEGATIVE mg/dL
Specific Gravity, Urine: 1.012 (ref 1.005–1.030)
pH: 5 (ref 5.0–8.0)

## 2022-03-11 LAB — COMPREHENSIVE METABOLIC PANEL
ALT: 15 U/L (ref 0–44)
AST: 17 U/L (ref 15–41)
Albumin: 3.7 g/dL (ref 3.5–5.0)
Alkaline Phosphatase: 80 U/L (ref 38–126)
Anion gap: 8 (ref 5–15)
BUN: 14 mg/dL (ref 6–20)
CO2: 20 mmol/L — ABNORMAL LOW (ref 22–32)
Calcium: 9.2 mg/dL (ref 8.9–10.3)
Chloride: 107 mmol/L (ref 98–111)
Creatinine, Ser: 0.7 mg/dL (ref 0.44–1.00)
GFR, Estimated: >60 mL/min (ref >60)
Glucose, Bld: 111 mg/dL — ABNORMAL HIGH (ref 70–99)
Potassium: 3.8 mmol/L (ref 3.5–5.1)
Sodium: 135 mmol/L (ref 135–145)
Total Bilirubin: 0.3 mg/dL (ref 0.3–1.2)
Total Protein: 7.6 g/dL (ref 6.5–8.1)

## 2022-03-11 LAB — I-STAT BETA HCG BLOOD, ED (MC, WL, AP ONLY): I-stat hCG, quantitative: <5 m[IU]/mL (ref <5)

## 2022-03-11 LAB — LIPASE, BLOOD: Lipase: 44 U/L (ref 11–51)

## 2022-03-11 MED ORDER — KETOROLAC TROMETHAMINE 60 MG/2ML IM SOLN
30.0000 mg | Freq: Once | INTRAMUSCULAR | Status: AC
  Administered 2022-03-11: 30 mg via INTRAMUSCULAR

## 2022-03-11 MED ORDER — NAPROXEN 500 MG PO TABS: 500.0000 mg | ORAL_TABLET | Freq: Two times a day (BID) | ORAL | 0 refills | Status: DC

## 2022-03-11 MED ORDER — ACETAMINOPHEN 325 MG PO TABS
650.0000 mg | ORAL_TABLET | Freq: Once | ORAL | Status: AC
  Administered 2022-03-11: 650 mg via ORAL
  Filled 2022-03-11: qty 2

## 2022-03-11 MED ORDER — KETOROLAC TROMETHAMINE 15 MG/ML IJ SOLN
15.0000 mg | Freq: Once | INTRAMUSCULAR | Status: DC
  Filled 2022-03-11: qty 1

## 2022-03-11 NOTE — ED Notes (Signed)
Patient left.

## 2022-03-11 NOTE — ED Triage Notes (Signed)
Patient arrives with severe pain to the  RLQ abdomen. Patient states pain started yesterday. Patient has noticed swelling in her abdomen. States the pain shoots from the abdomen to her back.

## 2022-03-11 NOTE — ED Provider Notes (Signed)
[place] DEPT Provider Note   CSN: [id number] Arrival date & time: 03/11/22  0218     History  Chief Complaint  Patient presents with   Abdominal Pain    Diamond Burton is a 36 y.o. female.   Abdominal Pain  Patient is a 36 year old female history of HIV, kidney stones, PID presents to the emergency department for evaluation of abdominal pain.  Patient states she has had abdominal pain starting 2 days ago.  Patient states the pain started on the left lower quadrant first and moved to the right lower quadrant.  Describes the pain as squeezing and radiating to her back.  Had a history of kidney stone.  No other surgical history.  Denies nausea, vomiting, chest pain, shortness of breath. Patient states she has had chronic constipation, no diarrhea.  Patient states she had a history of PID but the pain this time felt different.  She has taken Tylenol without relief. Patient states in the last 2 days her abdomen has become significantly distended.     Home Medications Prior to Admission medications   Medication Sig Start Date End Date Taking? Authorizing Provider  naproxen (NAPROSYN) 500 MG tablet Take 1 tablet (500 mg total) by mouth 2 (two) times daily. 03/11/22  Yes Rex Kras, PA  acetaminophen (TYLENOL) 325 MG tablet Take 2 tablets (650 mg total) by mouth every 4 (four) hours as needed (for pain scale < 4). Patient not taking: Reported on 02/18/2022 04/22/21   Aletha Halim, MD  elvitegravir-cobicistat-emtricitabine-tenofovir (GENVOYA) 150-150-200-10 MG TABS tablet Take 1 tablet by mouth daily with breakfast. 01/04/22   Keller Callas, NP  fluconazole (DIFLUCAN) 150 MG tablet Take 1 tablet (150 mg total) by mouth daily. Repeat in 24 hours if needed Patient not taking: Reported on 02/18/2022 06/09/21   Donnamae Jude, MD  fluconazole (DIFLUCAN) 150 MG tablet Take 1 tablet (150 mg total) by mouth daily. Patient not taking: Reported on 02/18/2022 11/12/21    [place] Callas, NP  ibuprofen (ADVIL) 800 MG tablet Take 1 tablet (800 mg total) by mouth every 8 (eight) hours as needed. Patient not taking: Reported on 02/18/2022 05/20/21   Shelly Bombard, MD  metroNIDAZOLE (FLAGYL) 500 MG tablet Take 1 tablet (500 mg total) by mouth 3 (three) times daily. Patient not taking: Reported on 02/18/2022 07/27/21   Chase Callas, NP  metroNIDAZOLE (FLAGYL) 500 MG tablet Take 1 tablet (500 mg total) by mouth 2 (two) times daily. 02/21/22   Griffin Basil, MD  PARoxetine (PAXIL) 20 MG tablet Take 20 mg by mouth daily.    [provider]  Prenat-Fe Poly-Methfol-FA-DHA (VITAFOL FE+) 90-0.6-0.4-200 MG CAPS Take 1 capsule by mouth daily before breakfast. Patient not taking: Reported on 02/18/2022 05/20/21   Shelly Bombard, MD  valACYclovir (VALTREX) 500 MG tablet TAKE 1 TABLET BY MOUTH TWICE A DAY 01/16/22   Griffin Basil, MD  ziprasidone (GEODON) 20 MG capsule Take by mouth. 09/27/21   [provider]      Allergies    Latex    Review of Systems   Review of Systems  Gastrointestinal:  Positive for abdominal pain.    Physical Exam Updated Vital Signs BP (!) 140/86 (BP Location: Right Arm)   Pulse 65   Temp 97.6 F (36.4 C) (Oral)   Resp 18   Ht 5\' 5"  (1.651 m)   Wt 101.6 kg   LMP 02/11/2022   SpO2 99%   BMI 37.27  kg/m  Physical Exam Vitals and nursing note reviewed.  Constitutional:      Appearance: Normal appearance.  HENT:     Head: Normocephalic and atraumatic.     Mouth/Throat:     Mouth: Mucous membranes are moist.  Eyes:     General: No scleral icterus. Cardiovascular:     Rate and Rhythm: Normal rate and regular rhythm.     Pulses: Normal pulses.     Heart sounds: Normal heart sounds.  Pulmonary:     Effort: Pulmonary effort is normal.     Breath sounds: Normal breath sounds.  Abdominal:     General: Abdomen is flat. There is distension.     Palpations: Abdomen is soft.     Tenderness: There is abdominal  tenderness in the right lower quadrant.  Musculoskeletal:        General: No deformity.  Skin:    General: Skin is warm.     Findings: No rash.  Neurological:     General: No focal deficit present.     Mental Status: She is alert.  Psychiatric:        Mood and Affect: Mood normal.     ED Results / Procedures / Treatments   Labs (all labs ordered are listed, but only abnormal results are displayed) Labs Reviewed  COMPREHENSIVE METABOLIC PANEL - Abnormal; Notable for the following components:      Result Value   CO2 20 (*)    Glucose, Bld 111 (*)    All other components within normal limits  URINALYSIS, ROUTINE W REFLEX MICROSCOPIC - Abnormal; Notable for the following components:   Color, Urine STRAW (*)    All other components within normal limits  LIPASE, BLOOD  CBC  I-STAT BETA HCG BLOOD, ED ([facility], [facility], [facility] ONLY)    EKG None  Radiology No results found.  Procedures Procedures    Medications Ordered in ED Medications  acetaminophen (TYLENOL) tablet 650 mg (650 mg Oral Given 03/11/22 0300)  ketorolac (TORADOL) injection 30 mg (30 mg Intramuscular Given 03/11/22 0545)    ED Course/ Medical Decision Making/ A&P                           Medical Decision Making Amount and/or Complexity of Data Reviewed Labs: ordered.  Risk Prescription drug management.   This patient presents to the ED for concern of abdominal pain, this involves an extensive number of treatment options, and is a complaint that carries with it a high risk of complications and morbidity.  The differential diagnosis includes bowel obstruction, appendicitis, diverticulitis, stone, ovarian cyst, ovarian torsion, PID, UTI, musculoskeletal, ischemic bowel disease, gastroenteritis.   Co morbidities that complicate the patient evaluation  See HPI   Additional history obtained:  Additional history obtained from EMR External records from outside source obtained and reviewed including Care  Everywhere/External Records and Primary Care Documents  Lab Tests:  I Ordered, and personally interpreted labs.  The pertinent results include:  No leukocytosis noted.  No evidence of anemia.  Platelets within normal range.  No electrolyte abnormalities noted.  Renal function within normal limits.  No transaminitis noted.  Lipase within normal limits.  UA significant for no acute abnormalities.   Imaging Studies ordered:  na   Cardiac Monitoring: / EKG:  The patient was maintained on a cardiac monitor.  I personally viewed and interpreted the cardiac monitored which showed an underlying rhythm of: sinus rhythm   Consultations Obtained:  na   Problem List / ED Course / Critical interventions / Medication management  Abdominal pain Vitals signs significant for blood pressure 140/86. Otherwise within normal range and stable throughout visit. Laboratory/imaging studies significant for: See above On physical examination, patient is afebrile, she has tenderness to palpation to the right lower quadrant.  Tylenol given with minimal improvement. Given labs and physical exam this is most likely musculoskeletal in nature. Patient is safe to discharge. Will prescribe naproxen for pain. I ordered medication including Toradol for pain  Reevaluation of the patient after these medicines showed that the patient improved I have reviewed the patients home medicines and have made adjustments as needed    Social Determinants of Health:  na   Test / Admission - Considered:  Continued outpatient therapy with the same. Follow-up with PCP recommended for reevaluation of symptoms. Treatment plan discussed with patient.  Pt acknowledged understanding was agreeable to the plan. Worrisome signs and symptoms were discussed with patient, and patient acknowledged understanding to return to the ED if they noticed these signs and symptoms. Patient was stable upon discharge.          Final Clinical  Impression(s) / ED Diagnoses Final diagnoses:  Right lower quadrant abdominal pain    Rx / DC Orders ED Discharge Orders          Ordered    naproxen (NAPROSYN) 500 MG tablet  2 times daily        03/11/22 0625              Rex Kras, PA 03/11/22 [id number]    Fatima Blank, MD 03/11/22 [phone]

## 2022-03-18 ENCOUNTER — Ambulatory Visit: Payer: Medicaid Other | Admitting: Obstetrics and Gynecology

## 2022-04-06 ENCOUNTER — Ambulatory Visit (INDEPENDENT_AMBULATORY_CARE_PROVIDER_SITE_OTHER): Payer: Medicaid Other

## 2022-04-06 ENCOUNTER — Other Ambulatory Visit (HOSPITAL_COMMUNITY)
Admission: RE | Admit: 2022-04-06 | Discharge: 2022-04-06 | Disposition: A | Discharge status: Home / Self Care | Payer: Medicaid Other | Source: Ambulatory Visit | Attending: Obstetrics and Gynecology | Admitting: Obstetrics and Gynecology

## 2022-04-06 DIAGNOSIS — N898 Other specified noninflammatory disorders of vagina: Secondary | ICD-10-CM | POA: Diagnosis present

## 2022-04-06 NOTE — Progress Notes (Signed)
SUBJECTIVE:  36 y.o. female complains of white and thick vaginal discharge for 2 week(s). Denies abnormal vaginal bleeding or significant pelvic pain or fever. No UTI symptoms. Denies history of known exposure to STD.  Patient's last menstrual period was 02/11/2022.  OBJECTIVE:  She appears well, afebrile. Urine dipstick: not done.  ASSESSMENT:  Vaginal Discharge     PLAN:  GC, chlamydia, trichomonas, BVAG, CVAG probe sent to lab. Treatment: To be determined once lab results are received ROV prn if symptoms persist or worsen.

## 2022-04-06 NOTE — Progress Notes (Signed)
Agree with nurses's documentation of this patient's clinic encounter.  Sebastyan Snodgrass L, MD  

## 2022-04-07 ENCOUNTER — Other Ambulatory Visit: Payer: Self-pay | Admitting: *Deleted

## 2022-04-07 DIAGNOSIS — B3731 Acute candidiasis of vulva and vagina: Secondary | ICD-10-CM

## 2022-04-07 LAB — CERVICOVAGINAL ANCILLARY ONLY
Bacterial Vaginitis (gardnerella): NEGATIVE
Candida Glabrata: NEGATIVE
Candida Vaginitis: POSITIVE — AB
Chlamydia: NEGATIVE
Comment: NEGATIVE
Comment: NEGATIVE
Comment: NEGATIVE
Comment: NEGATIVE
Comment: NEGATIVE
Comment: NORMAL
Neisseria Gonorrhea: NEGATIVE
Trichomonas: NEGATIVE

## 2022-04-07 MED ORDER — FLUCONAZOLE 150 MG PO TABS: 150.0000 mg | ORAL_TABLET | Freq: Every day | ORAL | 0 refills | Status: DC

## 2022-04-07 NOTE — Progress Notes (Signed)
TC. No answer. Left HIPPA compliant VM. MyChart message with education sent.

## 2022-04-11 ENCOUNTER — Encounter (HOSPITAL_COMMUNITY): Payer: Self-pay

## 2022-04-11 ENCOUNTER — Ambulatory Visit (HOSPITAL_COMMUNITY)
Admission: EM | Admit: 2022-04-11 | Discharge: 2022-04-11 | Disposition: A | Discharge status: Home / Self Care | Payer: Medicaid Other | Attending: Family Medicine | Admitting: Family Medicine

## 2022-04-11 DIAGNOSIS — H53149 Visual discomfort, unspecified: Secondary | ICD-10-CM | POA: Diagnosis not present

## 2022-04-11 DIAGNOSIS — R519 Headache, unspecified: Secondary | ICD-10-CM

## 2022-04-11 DIAGNOSIS — R7989 Other specified abnormal findings of blood chemistry: Secondary | ICD-10-CM

## 2022-04-11 MED ORDER — SUMATRIPTAN SUCCINATE 6 MG/0.5ML ~~LOC~~ SOLN
SUBCUTANEOUS | Status: AC
  Filled 2022-04-11: qty 0.5

## 2022-04-11 MED ORDER — DEXAMETHASONE SODIUM PHOSPHATE 10 MG/ML IJ SOLN
INTRAMUSCULAR | Status: AC
  Filled 2022-04-11: qty 1

## 2022-04-11 MED ORDER — DEXAMETHASONE SODIUM PHOSPHATE 10 MG/ML IJ SOLN
10.0000 mg | Freq: Once | INTRAMUSCULAR | Status: AC
  Administered 2022-04-11: 10 mg via INTRAMUSCULAR

## 2022-04-11 MED ORDER — KETOROLAC TROMETHAMINE 30 MG/ML IJ SOLN
30.0000 mg | Freq: Once | INTRAMUSCULAR | Status: AC
  Administered 2022-04-11: 30 mg via INTRAMUSCULAR

## 2022-04-11 MED ORDER — KETOROLAC TROMETHAMINE 30 MG/ML IJ SOLN
INTRAMUSCULAR | Status: AC
  Filled 2022-04-11: qty 1

## 2022-04-11 MED ORDER — SUMATRIPTAN SUCCINATE 6 MG/0.5ML ~~LOC~~ SOLN
6.0000 mg | Freq: Once | SUBCUTANEOUS | Status: AC
  Administered 2022-04-11: 6 mg via SUBCUTANEOUS

## 2022-04-11 NOTE — ED Provider Notes (Signed)
[place]    CSN: [id number] Arrival date & time: 04/11/22  1718      History   Chief Complaint Chief Complaint  Patient presents with   Headache    HPI Diamond Burton is a 36 y.o. female.    Headache  Here for headache that began about 2 or 3 days ago.  It is currently around her right temple.  When it first began it was posterior then moved to her left temple and out is on her right temple.  No loss of consciousness or syncope.  The pain is throbbing and she has had some photophobia.  Also there is been a little nausea.  No scotomata  She also has not slept since June 30 when she saw her niece who had been murdered.  She does see a psychiatrist and states she will be seeing them soon.  Also she had had some galactorrhea earlier in September, and prolactin level was done.  She was referred by her GYN to endocrinology for the elevated prolactin of 119.  No MRI of her pituitary has been ordered so far.   The galactorrhea has stopped since labs were drawn.  She does take Geodon  Past Medical History:  Diagnosis Date   Abnormal Pap smear    Bipolar disorder ([place])    not taking meds due to preg   Chlamydia infection affecting pregnancy in first trimester 10/10/2018   Depression    stopped meds, was wanting to see counselor. 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   History of gonorrhea    Hydradenitis    Infection    UTI   Kidney stones    Ovarian cyst    Periodontal disease    PID (pelvic inflammatory disease)    PTSD (post-traumatic stress disorder)    Trichomonas contact     Patient Active Problem List    Diagnosis Date Noted   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 02/18/2022   Abnormal uterine bleeding (AUB) 11/11/2021   Uterine cramping 11/10/2021   HIV (human immunodeficiency virus infection) ([place]) 07/27/2021   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   Homicidal ideation 09/11/2018   Suicidal ideation 09/11/2018   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   History of herpes genitalis 06/01/2016   History of syphilis 06/01/2016   Hydradenitis     Past Surgical History:  Procedure Laterality Date   HYDRADENITIS EXCISION  03/2019   IRRIGATION AND DEBRIDEMENT ABSCESS Left 03/20/2019   Procedure: IRRIGATION AND DEBRIDEMENT BREAST ABSCESS;  Surgeon: Coralie Keens, MD;  Location: [facility];  Service: General;  Laterality: Left;   NO PAST SURGERIES      OB History     Gravida  6   Para  5   Term  4   Preterm  1   AB  1   Living  4      SAB  1   IAB      Ectopic      Multiple  0   Live Births  4  Home Medications    Prior to Admission medications   Medication Sig Start Date End Date Taking? Authorizing Provider  acetaminophen (TYLENOL) 325 MG tablet Take 2 tablets (650 mg total) by mouth every 4 (four) hours as needed (for pain scale < 4). Patient not taking: Reported on 02/18/2022 04/22/21   Aletha Halim, MD  elvitegravir-cobicistat-emtricitabine-tenofovir (GENVOYA) 150-150-200-10 MG TABS tablet Take 1 tablet by mouth daily with breakfast. 01/04/22   [place] Callas, NP  ibuprofen (ADVIL) 800 MG tablet Take 1 tablet (800 mg total) by mouth every 8 (eight) hours as needed. Patient not taking: Reported on 02/18/2022 05/20/21   Shelly Bombard, MD  PARoxetine (PAXIL) 20 MG tablet Take 20 mg by mouth daily.    [provider]  valACYclovir (VALTREX) 500 MG tablet TAKE 1 TABLET BY MOUTH TWICE A DAY 01/16/22   Griffin Basil, MD  ziprasidone (GEODON) 20 MG capsule Take by mouth. 09/27/21   [provider]    Family History Family History  Problem Relation  Age of Onset   Depression Mother    Diabetes Mother    Hypertension Mother    Heart disease Mother    Asthma Sister    Asthma Daughter    Hypertension Maternal Grandmother    Diabetes Maternal Grandmother     Social History Social History   Tobacco Use   Smoking status: Former    Packs/day: 0.50    Years: 2.00    Total pack years: 1.00    Types: Cigarettes   Smokeless tobacco: Never  Vaping Use   Vaping Use: Never used  Substance Use Topics   Alcohol use: Yes    Comment: Occ   Drug use: No     Allergies   Latex   Review of Systems Review of Systems  Neurological:  Positive for headaches.     Physical Exam Triage Vital Signs ED Triage Vitals  Enc Vitals Group     BP 04/11/22 1748 117/74     Pulse Rate 04/11/22 1748 81     Resp 04/11/22 1748 18     Temp 04/11/22 1748 98.6 F (37 C)     Temp Source 04/11/22 1748 Oral     SpO2 04/11/22 1748 98 %     Weight --      Height --      Head Circumference --      Peak Flow --      Pain Score 04/11/22 1750 10     Pain Loc --      Pain Edu? --      Excl. in [place]? --    No data found.  Updated Vital Signs BP 117/74 (BP Location: Left Arm)   Pulse 81   Temp 98.6 F (37 C) (Oral)   Resp 18   LMP 03/14/2022   SpO2 98%   Visual Acuity Right Eye Distance:   Left Eye Distance:   Bilateral Distance:    Right Eye Near:   Left Eye Near:    Bilateral Near:     Physical Exam Vitals reviewed.  Constitutional:      General: She is not in acute distress.    Appearance: She is not toxic-appearing.  HENT:     Nose: Nose normal.     Mouth/Throat:     Mouth: Mucous membranes are moist.     Pharynx: No oropharyngeal exudate or posterior oropharyngeal erythema.  Eyes:     Extraocular Movements: Extraocular movements intact.  Conjunctiva/sclera: Conjunctivae normal.     Pupils: Pupils are equal, round, and reactive to light.  Cardiovascular:     Rate and Rhythm: Normal rate and regular rhythm.     Heart  sounds: No murmur heard. Pulmonary:     Effort: Pulmonary effort is normal. No respiratory distress.     Breath sounds: No stridor. No wheezing, rhonchi or rales.  Musculoskeletal:     Cervical back: Neck supple.  Lymphadenopathy:     Cervical: No cervical adenopathy.  Skin:    Capillary Refill: Capillary refill takes less than 2 seconds.     Coloration: Skin is not jaundiced or pale.  Neurological:     General: No focal deficit present.     Mental Status: She is alert and oriented to person, place, and time.     Cranial Nerves: No cranial nerve deficit.     Sensory: No sensory deficit.     Motor: No weakness.     Coordination: Coordination normal.     Gait: Gait normal.     Deep Tendon Reflexes: Reflexes normal.  Psychiatric:        Behavior: Behavior normal.      UC Treatments / Results  Labs (all labs ordered are listed, but only abnormal results are displayed) Labs Reviewed - No data to display  EKG   Radiology No results found.  Procedures Procedures (including critical care time)  Medications Ordered in UC Medications  ketorolac (TORADOL) 30 MG/ML injection 30 mg (has no administration in time range)  dexamethasone (DECADRON) injection 10 mg (has no administration in time range)  SUMAtriptan (IMITREX) injection 6 mg (has no administration in time range)    Initial Impression / Assessment and Plan / UC Course  I have reviewed the triage vital signs and the nursing notes.  Pertinent labs & imaging results that were available during my care of the patient were reviewed by me and considered in my medical decision making (see chart for details).         Given our headache cocktail of injections.  If not improving at all in the next few hours she will proceed to the emergency room for further evaluation Final Clinical Impressions(s) / UC Diagnoses   Final diagnoses:  Headache disorder  Elevated prolactin level     Discharge Instructions      You  have been given an injection of ketorolac, sumatriptan, and dexamethasone.   If your headache does not get better please proceed to the emergency room for further evaluation     ED Prescriptions   None    PDMP not reviewed this encounter.   Barrett Henle, MD 04/11/22 [phone]

## 2022-04-11 NOTE — ED Triage Notes (Addendum)
Pt c/o headache x2 days. States hx of same and was told she had brain tumors 2 months ago. States has never had a CT scan of her head. States she did just started new meds. States she hasn't slept in 2 months.

## 2022-04-11 NOTE — Discharge Instructions (Addendum)
You have been given an injection of ketorolac, sumatriptan, and dexamethasone.   If your headache does not get better please proceed to the emergency room for further evaluation

## 2022-04-27 ENCOUNTER — Encounter: Payer: Self-pay | Admitting: Infectious Diseases

## 2022-04-27 ENCOUNTER — Other Ambulatory Visit (HOSPITAL_COMMUNITY)
Admission: RE | Admit: 2022-04-27 | Discharge: 2022-04-27 | Disposition: A | Discharge status: Home / Self Care | Payer: Medicaid Other | Source: Ambulatory Visit | Attending: Infectious Diseases | Admitting: Infectious Diseases

## 2022-04-27 ENCOUNTER — Ambulatory Visit: Payer: Medicaid Other | Admitting: Infectious Diseases

## 2022-04-27 ENCOUNTER — Other Ambulatory Visit: Payer: Self-pay

## 2022-04-27 VITALS — BP 112/90 | HR 93 | Resp 16 | Ht 65.0 in | Wt 226.0 lb

## 2022-04-27 DIAGNOSIS — R45851 Suicidal ideations: Secondary | ICD-10-CM | POA: Diagnosis not present

## 2022-04-27 DIAGNOSIS — Z113 Encounter for screening for infections with a predominantly sexual mode of transmission: Secondary | ICD-10-CM | POA: Diagnosis present

## 2022-04-27 DIAGNOSIS — N643 Galactorrhea not associated with childbirth: Secondary | ICD-10-CM

## 2022-04-27 DIAGNOSIS — N949 Unspecified condition associated with female genital organs and menstrual cycle: Secondary | ICD-10-CM | POA: Diagnosis not present

## 2022-04-27 DIAGNOSIS — Z21 Asymptomatic human immunodeficiency virus [HIV] infection status: Secondary | ICD-10-CM

## 2022-04-27 DIAGNOSIS — R140 Abdominal distension (gaseous): Secondary | ICD-10-CM
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
## 2022-04-27 NOTE — Patient Instructions (Addendum)
Nice to see you!   For [facility] - please call them to set up a new patient appointment. You have a referral entered.  Phone: [phone]  *Check with your psychiatrist about the depression symptoms and also let her know you have had milk leaking - may be a side effect from the paxil. But you need to see endocrinology first to make sure not another cause.   Boric Acid suppositories for vaginal yeast infections could be helpful for you.   Align is the probiotic that I would recommend - can get it without a prescription (insurance does not typically cover it)   Keep taking your

## 2022-04-29 DIAGNOSIS — R140 Abdominal distension (gaseous): Secondary | ICD-10-CM | POA: Insufficient documentation

## 2022-04-29 DIAGNOSIS — N949 Unspecified condition associated with female genital organs and menstrual cycle: Secondary | ICD-10-CM | POA: Insufficient documentation

## 2022-04-29 LAB — CERVICOVAGINAL ANCILLARY ONLY
Bacterial Vaginitis (gardnerella): NEGATIVE
Candida Glabrata: NEGATIVE
Candida Vaginitis: NEGATIVE
Chlamydia: NEGATIVE
Comment: NEGATIVE
Comment: NEGATIVE
Comment: NEGATIVE
Comment: NEGATIVE
Comment: NEGATIVE
Comment: NORMAL
Neisseria Gonorrhea: NEGATIVE
Trichomonas: NEGATIVE

## 2022-04-29 LAB — HIV-1 RNA QUANT-NO REFLEX-BLD
HIV 1 RNA Quant: <20 Copies/mL — ABNORMAL HIGH
HIV-1 RNA Quant, Log: <1.3 Log cps/mL — ABNORMAL HIGH

## 2022-04-29 LAB — RPR: RPR Ser Ql: NONREACTIVE
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
## 2022-04-29 NOTE — Assessment & Plan Note (Signed)
We spent time together reviewing work-up for this thus far.  She had galactorrhea in September.  Prolactin level was elevated at that time.  GYN referred to endocrinology.  [name] has felt frustration being passed from Dr. To Dr.  She expresses concern over a brain tumor.  Certainly with the headaches and report of galactorrhea in the setting of elevated prolactin it makes sense to evaluate pituitary with brain MRI.  She is also on some antipsychotics/antidepressants that can be associated with this effect as well.  I did encourage her to discuss this with her psychiatrist at her visit coming up soon.  I also helped coordinate getting her the information for endocrinology on behalf of the referral that was placed by GYN.  I do think it is important that she follow-up with their team because ultimately they will evaluate her in the context of multiple potential causes and would be the correct team to treat her endocrine disruption.  She understood and agreed and will call to make a new patient appointment.

## 2022-04-29 NOTE — Assessment & Plan Note (Addendum)
Very well controlled on once daily Genvoya taking correctly with food every day.. No concerns with access or adherence to medication. They are tolerating the medication well without side effects. No drug interactions identified. Pertinent lab tests ordered today.  No changes to insurance coverage.  No dental needs today.  Concerns over mental health addressed elsewhere. Sexual health and family planning discussed -vaginitis panel obtained today with pelvic exam. We were not able to discuss recommended vaccines today given other mental health and physical concerns.  She would benefit from hepatitis a and B series.  We will try to get this fit into the next visit  Return in about 4 months (around 08/26/2022).

## 2022-04-29 NOTE — Assessment & Plan Note (Signed)
She has had some frequent complaints of vaginal discomfort with multiple vaginitis swabs collected in the past.  On pelvic exam she has no signs of cervicitis or vaginitis today.  No significant drainage that makes me concerned for infection.  We did obtain a swab per her request.  That being said we also discussed concept of colonization.  Given she had antibiotic use recently it does make sense that she has had some vaginal candidiasis symptoms.  I suggested using boric acid vaginal suppositories to help with some of the symptoms and spare her from frequent and excessive doses of fluconazole.  She has had Candida glabrata detected in the past which also does not resolved with fluconazole.  She will pick up the suppositories over-the-counter and trial them when she has symptoms again.

## 2022-04-29 NOTE — Assessment & Plan Note (Signed)
We also talked about the impact of antibiotics on gut microbiome.  I suggested she try over-the-counter align probiotic to see if this helps.  There is nothing externally on her skin and no signs of hidradenitis flares in the lower abdomen.  Everything is soft without any rebound tenderness today.  No concerns for any kind of acute process going on in her abdomen.

## 2022-05-06 ENCOUNTER — Encounter (HOSPITAL_COMMUNITY): Payer: Self-pay | Admitting: Emergency Medicine

## 2022-05-06 ENCOUNTER — Ambulatory Visit (HOSPITAL_COMMUNITY)
Admission: EM | Admit: 2022-05-06 | Discharge: 2022-05-06 | Disposition: A | Discharge status: Home / Self Care | Payer: Medicaid Other | Attending: Emergency Medicine | Admitting: Emergency Medicine

## 2022-05-06 DIAGNOSIS — L0231 Cutaneous abscess of buttock: Secondary | ICD-10-CM

## 2022-05-06 MED ORDER — KETOROLAC TROMETHAMINE 30 MG/ML IJ SOLN
30.0000 mg | Freq: Once | INTRAMUSCULAR | Status: AC
  Administered 2022-05-06: 30 mg via INTRAVENOUS

## 2022-05-06 MED ORDER — KETOROLAC TROMETHAMINE 30 MG/ML IJ SOLN
INTRAMUSCULAR | Status: AC
  Filled 2022-05-06: qty 1

## 2022-05-06 MED ORDER — TRAMADOL HCL 50 MG PO TABS: 50.0000 mg | ORAL_TABLET | Freq: Four times a day (QID) | ORAL | 0 refills | Status: DC | PRN

## 2022-05-06 MED ORDER — LIDOCAINE HCL (PF) 1 % IJ SOLN
INTRAMUSCULAR | Status: AC
  Filled 2022-05-06: qty 2

## 2022-05-06 NOTE — ED Triage Notes (Signed)
Pt hx HS and for two days having severe flare up in between buttocks. Pt reports that she had this for 12 years and having suicidal thoughts. Reports has some today but denies plan. Reports has a psychiatrist that she sees. Reports "I have to live with this for the rest of my life. Why do I want to live like this?" Pt reports that when has these thoughts thinks about her kids. Pt is under lots of stress.

## 2022-05-06 NOTE — Discharge Instructions (Addendum)
Today you are being treated for your hidradenitis  Your abscess has been drained here in office and I was able to expel a moderate amount of pus  Continue your antibiotics as directed by your physician.  You may take tramadol every 6 hours as needed for severe pain  Hold warm-hot compresses to affected area at least 4 times a day, this helps to facilitate draining, the more the better  Please return for evaluation for increased swelling, increased tenderness or pain, non healing site, non draining site, you begin to have fever or chills

## 2022-05-06 NOTE — ED Provider Notes (Addendum)
[place]    CSN: [id number] Arrival date & time: 05/06/22  0901      History   Chief Complaint Chief Complaint  Patient presents with   Abscess    HPI Tanaiyah Christie is a 36 y.o. female.   Patient presents with abscess to the right buttocks worsening over the last 2 days.  Has become erythematous and painful making it difficult to sit and complete activities.  History of hidradenitis currently taking doxycycline, clindamycin and spironolactone.  Denies fever or drainage.    Past Medical History:  Diagnosis Date   Abnormal Pap smear    Bipolar disorder ([place])    not taking meds due to preg   Chlamydia infection affecting pregnancy in first trimester 10/10/2018   Depression    stopped meds, was wanting to see counselor. 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   History of gonorrhea    Hydradenitis    Infection    UTI   Kidney stones    Ovarian cyst    Periodontal disease    PID (pelvic inflammatory disease)    PTSD (post-traumatic stress disorder)    Trichomonas contact     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 02/18/2022   Abnormal uterine bleeding (AUB) 11/11/2021   Uterine cramping 11/10/2021   HIV (human immunodeficiency virus infection) ([place]) 07/27/2021   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   Homicidal ideation 09/11/2018   Suicidal ideation 09/11/2018   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   History of herpes genitalis 06/01/2016   History of syphilis 06/01/2016   Hydradenitis     Past Surgical  History:  Procedure Laterality Date   HYDRADENITIS EXCISION  03/2019   IRRIGATION AND DEBRIDEMENT ABSCESS Left 03/20/2019   Procedure: IRRIGATION AND DEBRIDEMENT BREAST ABSCESS;  Surgeon: Coralie Keens, MD;  Location: [facility];  Service: General;  Laterality: Left;   NO PAST SURGERIES      OB History     Gravida  6   Para  5   Term  4   Preterm  1   AB  1   Living  4      SAB  1   IAB      Ectopic      Multiple  0   Live Births  4            Home Medications    Prior to Admission medications   Medication Sig Start Date End Date Taking? Authorizing Provider  acetaminophen (TYLENOL) 325 MG tablet Take 2 tablets (650 mg total) by mouth every 4 (four) hours as needed (for pain scale < 4). Patient not taking: Reported on 02/18/2022 04/22/21   Aletha Halim, MD  elvitegravir-cobicistat-emtricitabine-tenofovir (GENVOYA) 150-150-200-10 MG TABS tablet Take 1 tablet by mouth daily with breakfast. 01/04/22   [place] Callas, NP  ibuprofen (ADVIL) 800 MG tablet Take 1 tablet (800 mg total) by mouth every 8 (eight) hours as needed. Patient not taking:  Reported on 02/18/2022 05/20/21   Brock Bad, MD  PARoxetine (PAXIL) 20 MG tablet Take 20 mg by mouth daily.    [provider]  valACYclovir (VALTREX) 500 MG tablet TAKE 1 TABLET BY MOUTH TWICE A DAY 01/16/22   Warden Fillers, MD  ziprasidone (GEODON) 20 MG capsule Take by mouth. 09/27/21   [provider]    Family History Family History  Problem Relation Age of Onset   Depression Mother    Diabetes Mother    Hypertension Mother    Heart disease Mother    Asthma Sister    Asthma Daughter    Hypertension Maternal Grandmother    Diabetes Maternal Grandmother     Social History Social History   Tobacco Use   Smoking status: Former    Packs/day: 0.50    Years: 2.00    Total pack years: 1.00    Types: Cigarettes   Smokeless tobacco: Never  Vaping Use   Vaping Use: Never used   Substance Use Topics   Alcohol use: Yes    Comment: Occ   Drug use: No     Allergies   Latex   Review of Systems Review of Systems  Constitutional: Negative.   Cardiovascular: Negative.   Skin:  Positive for wound. Negative for color change, pallor and rash.     Physical Exam Triage Vital Signs ED Triage Vitals  Enc Vitals Group     BP 05/06/22 1002 130/85     Pulse Rate 05/06/22 1002 85     Resp 05/06/22 1002 17     Temp 05/06/22 1002 98.4 F (36.9 C)     Temp src --      SpO2 05/06/22 1002 96 %     Weight --      Height --      Head Circumference --      Peak Flow --      Pain Score 05/06/22 0958 10     Pain Loc --      Pain Edu? --      Excl. in GC? --    No data found.  Updated Vital Signs BP 130/85 (BP Location: Right Arm)   Pulse 85   Temp 98.4 F (36.9 C)   Resp 17   LMP 04/19/2022   SpO2 96%   Visual Acuity Right Eye Distance:   Left Eye Distance:   Bilateral Distance:    Right Eye Near:   Left Eye Near:    Bilateral Near:     Physical Exam Constitutional:      Appearance: Normal appearance.  Eyes:     Extraocular Movements: Extraocular movements intact.  Pulmonary:     Effort: Pulmonary effort is normal.  Skin:    Comments: 2 x 3 abscess present to the right buttocks adjacent to the gluteal cleft  Neurological:     Mental Status: She is alert and oriented to person, place, and time. Mental status is at baseline.  Psychiatric:        Mood and Affect: Mood normal.        Behavior: Behavior normal.      UC Treatments / Results  Labs (all labs ordered are listed, but only abnormal results are displayed) Labs Reviewed - No data to display  EKG   Radiology No results found.  Procedures Incision and Drainage  Date/Time: 05/06/2022 10:57 AM  Performed by: Valinda Hoar, NP Authorized by: Valinda Hoar, NP   Consent:  Consent obtained:  Verbal   Consent given by:  Patient   Risks, benefits, and alternatives  were discussed: yes     Risks discussed:  Incomplete drainage Universal protocol:    Procedure explained and questions answered to patient or proxy's satisfaction: yes     Patient identity confirmed:  Verbally with patient Location:    Type:  Abscess   Size:  2x3   Location: right buttock. Pre-procedure details:    Skin preparation:  Povidone-iodine Sedation:    Sedation type:  None Anesthesia:    Anesthesia method:  Local infiltration   Local anesthetic:  Lidocaine 1% w/o epi Procedure type:    Complexity:  Simple Procedure details:    Ultrasound guidance: no     Incision types:  Single straight   Drainage:  Purulent   Drainage amount:  Copious   Wound treatment:  Wound left open   Packing materials:  None Post-procedure details:    Procedure completion:  Tolerated  (including critical care time)  Medications Ordered in UC Medications - No data to display  Initial Impression / Assessment and Plan / UC Course  I have reviewed the triage vital signs and the nursing notes.  Pertinent labs & imaging results that were available during my care of the patient were reviewed by me and considered in my medical decision making (see chart for details).  Abscess to the right buttocks  Incision and drainage completed in office, copious purulent drainage expelled, tolerated well, questing narcotic pain management in office as she endorses her pain is severe, discussed with patient that as she drove she will be unable to receive, Toradol injection given as an alternative, tramadol prescribed for outpatient tenderness patient is currently taking antibiotics advised to continue as prescribed, may hold warm compresses to the area to help facilitate drainage and comfort and advised to follow-up with her specialist if symptoms worsen Final Clinical Impressions(s) / UC Diagnoses   Final diagnoses:  None   Discharge Instructions   None    ED Prescriptions   None    PDMP not reviewed  this encounter.   Hans Eden, NP 05/06/22 1056    Hans Eden, NP 05/06/22 1057

## 2022-05-06 NOTE — ED Notes (Signed)
Discharged by Dee, CMA.  

## 2022-07-05 ENCOUNTER — Ambulatory Visit: Payer: Medicaid Other | Admitting: Obstetrics

## 2022-07-12 ENCOUNTER — Ambulatory Visit: Payer: Medicaid Other | Admitting: Obstetrics

## 2022-07-19 ENCOUNTER — Encounter: Payer: Self-pay | Admitting: Obstetrics

## 2022-07-19 ENCOUNTER — Other Ambulatory Visit (HOSPITAL_COMMUNITY)
Admission: RE | Admit: 2022-07-19 | Discharge: 2022-07-19 | Disposition: A | Discharge status: Home / Self Care | Payer: Medicaid Other | Source: Ambulatory Visit | Attending: Obstetrics | Admitting: Obstetrics

## 2022-07-19 ENCOUNTER — Ambulatory Visit: Payer: Medicaid Other | Admitting: Obstetrics

## 2022-07-19 VITALS — BP 124/74 | HR 72 | Wt 221.0 lb

## 2022-07-19 DIAGNOSIS — N898 Other specified noninflammatory disorders of vagina: Secondary | ICD-10-CM | POA: Insufficient documentation

## 2022-07-19 DIAGNOSIS — N939 Abnormal uterine and vaginal bleeding, unspecified: Secondary | ICD-10-CM

## 2022-07-19 DIAGNOSIS — Z01419 Encounter for gynecological examination (general) (routine) without abnormal findings: Secondary | ICD-10-CM | POA: Diagnosis not present

## 2022-07-19 DIAGNOSIS — Z113 Encounter for screening for infections with a predominantly sexual mode of transmission: Secondary | ICD-10-CM

## 2022-07-19 DIAGNOSIS — E669 Obesity, unspecified: Secondary | ICD-10-CM

## 2022-07-19 DIAGNOSIS — Z3202 Encounter for pregnancy test, result negative: Secondary | ICD-10-CM | POA: Diagnosis not present

## 2022-07-19 DIAGNOSIS — R102 Pelvic and perineal pain: Secondary | ICD-10-CM

## 2022-07-19 LAB — POCT URINE PREGNANCY: Preg Test, Ur: NEGATIVE

## 2022-07-19 NOTE — Progress Notes (Signed)
Pt reports irregular cycles and requests all STD testing.  Pt wants to start birth control; she's unsure which one.

## 2022-07-19 NOTE — Progress Notes (Signed)
Subjective:        Diamond Burton is a 37 y.o. female here for a routine exam.  Current complaints: Irregular vaginal bleeding.  Had 3 periods this past month, but she says that she has been under a lot of stress for the past few months..    Personal health questionnaire:  Is patient Ashkenazi Jewish, have a family history of breast and/or ovarian cancer: no Is there a family history of uterine cancer diagnosed at age < 34, gastrointestinal cancer, urinary tract cancer, family member who is a Field seismologist syndrome-associated carrier: no Is the patient overweight and hypertensive, family history of diabetes, personal history of gestational diabetes, preeclampsia or PCOS: no Is patient over 16, have PCOS,  family history of premature CHD under age 58, diabetes, smoke, have hypertension or peripheral artery disease:  no At any time, has a partner hit, kicked or otherwise hurt or frightened you?: no Over the past 2 weeks, have you felt down, depressed or hopeless?: no Over the past 2 weeks, have you felt little interest or pleasure in doing things?:no   Gynecologic History Patient's last menstrual period was 06/27/2022. Contraception: none Last Pap: 2022. Results were: normal Last mammogram: n/a. Results were: n/a  Obstetric History OB History  Gravida Para Term Preterm AB Living  6 5 4 1 1 4   SAB IAB Ectopic Multiple Live Births  1     0 4    # Outcome Date GA Lbr Len/2nd Weight Sex Delivery Anes PTL Lv  6 Preterm 04/22/21 [redacted]w[redacted]d   F    FD  5 Term 04/29/19 [redacted]w[redacted]d 12:20 / 00:15 8 lb 0.4 oz (3.64 kg) M Vag-Spont EPI  LIV  4 SAB 07/2018          3 Term 11/29/16 [redacted]w[redacted]d 09:26 / 00:25 5 lb 11 oz (2.58 kg) M Vag-Vacuum EPI  LIV  2 Term 06/28/11 [redacted]w[redacted]d 12:32 / 00:21 7 lb 1.1 oz (3.205 kg) F Vag-Spont EPI  LIV     Birth Comments: WNL  1 Term 2005 [redacted]w[redacted]d  6 lb 8 oz (2.948 kg) F Vag-Spont   LIV    Past Medical History:  Diagnosis Date   Abnormal Pap smear    Bipolar disorder ([place])    not  taking meds due to preg   Chlamydia infection affecting pregnancy in first trimester 10/10/2018   Depression    stopped meds, was wanting to see counselor. 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   History of gonorrhea    Hydradenitis    Infection    UTI   Kidney stones    Ovarian cyst    Periodontal disease    PID (pelvic inflammatory disease)    PTSD (post-traumatic stress disorder)    Trichomonas contact     Past Surgical History:  Procedure Laterality Date   HYDRADENITIS EXCISION  03/2019   IRRIGATION AND DEBRIDEMENT ABSCESS Left 03/20/2019   Procedure: IRRIGATION AND DEBRIDEMENT BREAST ABSCESS;  Surgeon: Coralie Keens, MD;  Location: [facility];  Service: General;  Laterality: Left;   NO PAST SURGERIES  Current Outpatient Medications:    PARoxetine (PAXIL) 20 MG tablet, Take 20 mg by mouth daily., Disp: , Rfl:    spironolactone (ALDACTONE) 50 MG tablet, Take 50 mg by mouth daily., Disp: , Rfl:    valACYclovir (VALTREX) 500 MG tablet, TAKE 1 TABLET BY MOUTH TWICE A DAY, Disp: 60 tablet, Rfl: 6   ziprasidone (GEODON) 20 MG capsule, Take by mouth., Disp: , Rfl:    acetaminophen (TYLENOL) 325 MG tablet, Take 2 tablets (650 mg total) by mouth every 4 (four) hours as needed (for pain scale < 4). (Patient not taking: Reported on 02/18/2022), Disp: , Rfl:    elvitegravir-cobicistat-emtricitabine-tenofovir (GENVOYA) 150-150-200-10 MG TABS tablet, Take 1 tablet by mouth daily with breakfast. (Patient not taking: Reported on 07/19/2022), Disp: 30 tablet, Rfl: 11   ibuprofen (ADVIL) 800 MG tablet, Take 1 tablet (800 mg total) by mouth every 8 (eight) hours  as needed. (Patient not taking: Reported on 02/18/2022), Disp: 30 tablet, Rfl: 5   traMADol (ULTRAM) 50 MG tablet, Take 1 tablet (50 mg total) by mouth every 6 (six) hours as needed. (Patient not taking: Reported on 07/19/2022), Disp: 15 tablet, Rfl: 0 Allergies  Allergen Reactions   Latex     Social History   Tobacco Use   Smoking status: Former    Packs/day: 0.50    Years: 2.00    Total pack years: 1.00    Types: Cigarettes    Passive exposure: Never   Smokeless tobacco: Never  Substance Use Topics   Alcohol use: Yes    Comment: Occ    Family History  Problem Relation Age of Onset   Depression Mother    Diabetes Mother    Hypertension Mother    Heart disease Mother    Asthma Sister    Asthma Daughter    Hypertension Maternal Grandmother    Diabetes Maternal Grandmother       Review of Systems  Constitutional: negative for fatigue and weight loss Respiratory: negative for cough and wheezing Cardiovascular: negative for chest pain, fatigue and palpitations Gastrointestinal: negative for abdominal pain and change in bowel habits Musculoskeletal:negative for myalgias Neurological: negative for gait problems and tremors Behavioral/Psych: negative for abusive relationship, depression Endocrine: negative for temperature intolerance    Genitourinary:negative for abnormal menstrual periods, genital lesions, hot flashes, sexual problems and vaginal discharge Integument/breast: negative for breast lump, breast tenderness, nipple discharge and skin lesion(s)    Objective:       BP 124/74   Pulse 72   Wt 221 lb (100.2 kg)   LMP 06/27/2022   BMI 36.78 kg/m  General:   Alert and no distr ess  Skin:   no rash or abnormalities  Lungs:   clear to auscultation bilaterally  Heart:   regular rate and rhythm, S1, S2 normal, no murmur, click, rub or gallop  Breasts:   normal without suspicious masses, skin or nipple changes or axillary nodes  Abdomen:  normal findings: no  organomegaly, soft, non-tender and no hernia  Pelvis:  External genitalia: normal general appearance Urinary system: urethral meatus normal and bladder without fullness, nontender Vaginal: normal without tenderness, induration or masses Cervix: normal appearance Adnexa: normal bimanual exam Uterus: anteverted and non-tender, normal size   Lab Review Urine pregnancy test Labs reviewed yes Radiologic studies reviewed yes  I have spent a total of 20 minutes of face-to-face time, excluding clinical staff time, reviewing notes and preparing to see patient, ordering tests and/or medications, and counseling the patient.  Assessment:    1. Encounter for gynecological examination with Papanicolaou smear of cervix Rx: - Cytology - PAP( [facility]) - POCT urine pregnancy  2. Abnormal uterine bleeding (AUB) Rx: - US PELVIC COMPLETE WITH TRANSVAGINAL; Future  3. Pelvic pain Rx: - US PELVIC COMPLETE WITH TRANSVAGINAL; Future  4. Vaginal discharge Rx: - Cervicovaginal ancillary only( [facility])  5. Screen for STD (sexually transmitted disease) Rx: - Hepatitis B surface antigen - Hepatitis C antibody - RPR - HIV Antibody (routine testing w rflx)  6. Obesity (BMI 35.0-39.9 without comorbidity) - weight reduction recommended     Plan:    Education reviewed: calcium supplements, depression evaluation, low fat, low cholesterol diet, safe sex/STD prevention, self breast exams, and weight bearing exercise. Contraception: none. Follow up in: 1 year.     Orders Placed This Encounter  Procedures   US PELVIC COMPLETE WITH TRANSVAGINAL    Standing Status:   Future    Standing Expiration Date:   07/20/2023    Order Specific Question:   Reason for Exam (SYMPTOM  OR DIAGNOSIS REQUIRED)    Answer:   AUB    Order Specific Question:   Preferred imaging location?    Answer:   [facility] Ultrasound   Hepatitis B surface antigen   Hepatitis C antibody   RPR   HIV Antibody (routine testing  w rflx)   POCT urine pregnancy    Shelly Bombard, MD 07/19/2022 11:03 AM

## 2022-07-20 LAB — CERVICOVAGINAL ANCILLARY ONLY
Bacterial Vaginitis (gardnerella): POSITIVE — AB
Candida Glabrata: NEGATIVE
Candida Vaginitis: NEGATIVE
Chlamydia: NEGATIVE
Comment: NEGATIVE
Comment: NEGATIVE
Comment: NEGATIVE
Comment: NEGATIVE
Comment: NEGATIVE
Comment: NORMAL
Neisseria Gonorrhea: NEGATIVE
Trichomonas: POSITIVE — AB

## 2022-07-20 LAB — HIV 1/2 AB DIFFERENTIATION
HIV 1 Ab: REACTIVE
HIV 2 Ab: NONREACTIVE
NOTE (HIV CONF MULTIP: POSITIVE — AB

## 2022-07-20 LAB — RPR: RPR Ser Ql: NONREACTIVE

## 2022-07-20 LAB — HEPATITIS C ANTIBODY: Hep C Virus Ab: NONREACTIVE

## 2022-07-20 LAB — HEPATITIS B SURFACE ANTIGEN: Hepatitis B Surface Ag: NEGATIVE

## 2022-07-20 LAB — HIV ANTIBODY (ROUTINE TESTING W REFLEX): HIV Screen 4th Generation wRfx: REACTIVE

## 2022-07-21 ENCOUNTER — Other Ambulatory Visit: Payer: Self-pay | Admitting: Obstetrics

## 2022-07-21 DIAGNOSIS — B9689 Other specified bacterial agents as the cause of diseases classified elsewhere: Secondary | ICD-10-CM

## 2022-07-21 DIAGNOSIS — A599 Trichomoniasis, unspecified: Secondary | ICD-10-CM

## 2022-07-21 MED ORDER — METRONIDAZOLE 500 MG PO TABS: 500.0000 mg | ORAL_TABLET | Freq: Two times a day (BID) | ORAL | 2 refills | Status: DC

## 2022-07-26 ENCOUNTER — Ambulatory Visit (HOSPITAL_BASED_OUTPATIENT_CLINIC_OR_DEPARTMENT_OTHER): Payer: Medicaid Other

## 2022-07-27 LAB — CYTOLOGY - PAP
Comment: NEGATIVE
Diagnosis: NEGATIVE
High risk HPV: NEGATIVE

## 2022-08-15 ENCOUNTER — Ambulatory Visit: Payer: Medicaid Other | Admitting: Internal Medicine

## 2022-08-15 NOTE — Progress Notes (Deleted)
Name: Diamond Burton  MRN/ DOB: [id number], [birth year]    Age/ Sex: 37 y.o., female    PCP: [facility] [place]   Reason for Endocrinology Evaluation: Galactorrhea     Date of Initial Endocrinology Evaluation: 08/15/2022     HPI: Diamond Burton is a 37 y.o. female with a past medical history of bipolar disorder, depression, and PTSD. The patient presented for initial endocrinology clinic visit on 08/15/2022 for consultative assistance with her galactorrhea.   Patient presented to GYN for evaluation of galactorrhea and was noted with an elevated prolactin level in September 2023 and 119 NG/mL   Of note, the patient follows with dermatology for hidradenitis suppurativa She also follows with GYN, last pelvic exam 07/19/2022   Patient follows with psychiatry, patient on Geodon  HISTORY:  Past Medical History:  Past Medical History:  Diagnosis Date   Abnormal Pap smear    Bipolar disorder ([place])    not taking meds due to preg   Chlamydia infection affecting pregnancy in first trimester 10/10/2018   Depression    stopped meds, was wanting to see counselor. 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   History of gonorrhea    Hydradenitis    Infection    UTI   Kidney stones    Ovarian cyst    Periodontal disease    PID (pelvic inflammatory disease)    PTSD (post-traumatic stress disorder)    Trichomonas contact    Past Surgical History:  Past Surgical History:  Procedure Laterality Date   HYDRADENITIS EXCISION  03/2019   IRRIGATION AND DEBRIDEMENT ABSCESS Left 03/20/2019   Procedure: IRRIGATION AND DEBRIDEMENT BREAST ABSCESS;   Surgeon: Coralie Keens, MD;  Location: [facility];  Service: General;  Laterality: Left;   NO PAST SURGERIES      Social History:  reports that she has quit smoking. Her smoking use included cigarettes. She has a 1.00 pack-year smoking history. She has never been exposed to tobacco smoke. She has never used smokeless tobacco. She reports current alcohol use. She reports that she does not use drugs. Family History: family history includes Asthma in her daughter and sister; Depression in her mother; Diabetes in her maternal grandmother and mother; Heart disease in her mother; Hypertension in her maternal grandmother and mother.   HOME MEDICATIONS: Allergies as of 08/15/2022       Reactions   Latex         Medication List        Accurate as of August 15, 2022  7:15 AM. If you have any questions, ask your nurse or doctor.          acetaminophen 325 MG tablet Commonly known as: Tylenol Take 2 tablets (650 mg total) by mouth every 4 (four) hours as needed (for pain scale < 4).   Genvoya 150-150-200-10 MG Tabs tablet Generic drug: elvitegravir-cobicistat-emtricitabine-tenofovir Take 1 tablet by mouth daily with breakfast.   ibuprofen 800 MG tablet Commonly known as: ADVIL Take 1 tablet (800 mg total) by mouth every 8 (eight) hours as needed.  metroNIDAZOLE 500 MG tablet Commonly known as: FLAGYL Take 1 tablet (500 mg total) by mouth 2 (two) times daily.   PARoxetine 20 MG tablet Commonly known as: PAXIL Take 20 mg by mouth daily.   spironolactone 50 MG tablet Commonly known as: ALDACTONE Take 50 mg by mouth daily.   traMADol 50 MG tablet Commonly known as: ULTRAM Take 1 tablet (50 mg total) by mouth every 6 (six) hours as needed.   valACYclovir 500 MG tablet Commonly known as: VALTREX TAKE 1 TABLET BY MOUTH TWICE A DAY   ziprasidone 20 MG capsule Commonly known as: GEODON Take by mouth.          REVIEW OF SYSTEMS: A comprehensive ROS was conducted with the  patient and is negative except as per HPI and below:  ROS     OBJECTIVE:  VS: LMP 06/27/2022    Wt Readings from Last 3 Encounters:  07/19/22 221 lb (100.2 kg)  04/27/22 226 lb (102.5 kg)  03/11/22 223 lb 15.8 oz (101.6 kg)     EXAM: General: Pt appears well and is in NAD  Eyes: External eye exam normal without stare, lid lag or exophthalmos.  EOM intact.  PERRL.  Neck: General: Supple without adenopathy. Thyroid: Thyroid size normal.  No goiter or nodules appreciated. No thyroid bruit.  Lungs: Clear with good BS bilat with no rales, rhonchi, or wheezes  Heart: Auscultation: RRR.  Abdomen: Normoactive bowel sounds, soft, nontender, without masses or organomegaly palpable  Extremities:  BL LE: No pretibial edema normal ROM and strength.  Mental Status: Judgment, insight: Intact Orientation: Oriented to time, place, and person Mood and affect: No depression, anxiety, or agitation     DATA REVIEWED: ***    ASSESSMENT/PLAN/RECOMMENDATIONS:   Hyperprolactinemia:    Medications :  Signed electronically by: Mack Guise, MD  [facility] End[facility]  [facility] [address], [place], [place] [address] Phone: [phone] FAX: [phone]   CC: [facility], [facility] [address] [place] [address] Phone: [phone] Fax: [phone]   Return to Endocrinology clinic as below: Future Appointments  Date Time Provider [place]  08/15/2022  8:30 AM Graesyn Schreifels, Melanie Crazier, MD [facility] None  08/24/2022  4:00 PM [place] Callas, NP [facility]

## 2022-08-17 ENCOUNTER — Ambulatory Visit: Payer: Medicaid Other | Admitting: Obstetrics

## 2022-08-18 ENCOUNTER — Ambulatory Visit: Payer: Medicaid Other | Admitting: Obstetrics

## 2022-08-18 ENCOUNTER — Encounter: Payer: Self-pay | Admitting: Obstetrics

## 2022-08-18 ENCOUNTER — Other Ambulatory Visit (HOSPITAL_COMMUNITY)
Admission: RE | Admit: 2022-08-18 | Discharge: 2022-08-18 | Disposition: A | Discharge status: Home / Self Care | Payer: Medicaid Other | Source: Ambulatory Visit | Attending: Obstetrics | Admitting: Obstetrics

## 2022-08-18 VITALS — BP 110/70 | HR 61 | Ht 65.0 in | Wt 222.0 lb

## 2022-08-18 DIAGNOSIS — N898 Other specified noninflammatory disorders of vagina: Secondary | ICD-10-CM

## 2022-08-18 DIAGNOSIS — Z8739 Personal history of other diseases of the musculoskeletal system and connective tissue: Secondary | ICD-10-CM | POA: Diagnosis not present

## 2022-08-18 DIAGNOSIS — E669 Obesity, unspecified: Secondary | ICD-10-CM

## 2022-08-18 DIAGNOSIS — R519 Headache, unspecified: Secondary | ICD-10-CM

## 2022-08-18 MED ORDER — TINIDAZOLE 500 MG PO TABS: 1000.0000 mg | ORAL_TABLET | Freq: Every day | ORAL | 2 refills | Status: DC

## 2022-08-18 MED ORDER — IBUPROFEN 800 MG PO TABS: 800.0000 mg | ORAL_TABLET | Freq: Three times a day (TID) | ORAL | 5 refills | Status: AC | PRN

## 2022-08-18 MED ORDER — METRONIDAZOLE 0.75 % VA GEL: 1.0000 | Freq: Two times a day (BID) | VAGINAL | 0 refills | Status: DC

## 2022-08-18 NOTE — Progress Notes (Addendum)
37 y.o. GYN presents for vaginal discharge, odor, itching, irritation.  C/o headaches 20/10 x 3+ months.

## 2022-08-18 NOTE — Progress Notes (Addendum)
Patient ID: Diamond Burton, female   DOB: 09-Mar-1986, 37 y.o.   MRN: [id number]  Chief Complaint  Patient presents with   Vaginal Discharge    HPI Diamond Burton is a 37 y.o. female.  Complains of vaginal discharge and irritation HPI  Past Medical History:  Diagnosis Date   Abnormal Pap smear    Bipolar disorder ([place])    not taking meds due to preg   Chlamydia infection affecting pregnancy in first trimester 10/10/2018   Depression    stopped meds, was wanting to see counselor. 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   History of gonorrhea    Hydradenitis    Infection    UTI   Kidney stones    Ovarian cyst    Periodontal disease    PID (pelvic inflammatory disease)    PTSD (post-traumatic stress disorder)    Trichomonas contact     Past Surgical History:  Procedure Laterality Date   HYDRADENITIS EXCISION  03/2019   IRRIGATION AND DEBRIDEMENT ABSCESS Left 03/20/2019   Procedure: IRRIGATION AND DEBRIDEMENT BREAST ABSCESS;  Surgeon: Coralie Keens, MD;  Location: [place];  Service: General;  Laterality: Left;   NO PAST SURGERIES      Family History  Problem Relation Age of Onset   Depression Mother    Diabetes Mother    Hypertension Mother    Heart disease Mother    Asthma Sister    Asthma Daughter    Hypertension Maternal Grandmother    Diabetes Maternal Grandmother     Social History Social History   Tobacco Use   Smoking status: Former    Packs/day: 0.50    Years: 2.00    Total pack years: 1.00    Types: Cigarettes    Passive exposure: Never   Smokeless tobacco: Never  Vaping Use   Vaping Use: Never used   Substance Use Topics   Alcohol use: Yes    Comment: Occ   Drug use: No    Allergies  Allergen Reactions   Latex     Current Outpatient Medications  Medication Sig Dispense Refill   metroNIDAZOLE (METROGEL) 0.75 % vaginal gel Place 1 Applicatorful vaginally 2 (two) times daily. 70 g 0   tinidazole (TINDAMAX) 500 MG tablet Take 2 tablets (1,000 mg total) by mouth daily with breakfast. 10 tablet 2   acetaminophen (TYLENOL) 325 MG tablet Take 2 tablets (650 mg total) by mouth every 4 (four) hours as needed (for pain scale < 4). (Patient not taking: Reported on 02/18/2022)     elvitegravir-cobicistat-emtricitabine-tenofovir (GENVOYA) 150-150-200-10 MG TABS tablet Take 1 tablet by mouth daily with breakfast. (Patient not taking: Reported on 07/19/2022) 30 tablet 11   ibuprofen (ADVIL) 800 MG tablet Take 1 tablet (800 mg total) by mouth every 8 (eight) hours as needed. 30 tablet 5   metroNIDAZOLE (FLAGYL) 500 MG tablet Take 1 tablet (500 mg total) by mouth 2 (two) times daily. 14 tablet 2   PARoxetine (PAXIL) 20 MG tablet Take 20 mg by mouth daily.     spironolactone (ALDACTONE) 50 MG  tablet Take 50 mg by mouth daily.     traMADol (ULTRAM) 50 MG tablet Take 1 tablet (50 mg total) by mouth every 6 (six) hours as needed. (Patient not taking: Reported on 07/19/2022) 15 tablet 0   valACYclovir (VALTREX) 500 MG tablet TAKE 1 TABLET BY MOUTH TWICE A DAY 60 tablet 6   ziprasidone (GEODON) 20 MG capsule Take by mouth.     No current facility-administered medications for this visit.    Review of Systems Review of Systems Constitutional: negative for fatigue and weight loss Respiratory: negative for cough and wheezing Cardiovascular: negative for chest pain, fatigue and palpitations Gastrointestinal: negative for abdominal pain and change in bowel habits Genitourinary: positive for vaginal discharge with odor, and vaginal irritation Integument/breast: negative for nipple  discharge Musculoskeletal:negative for myalgias Neurological: negative for gait problems and tremors Behavioral/Psych: negative for abusive relationship, depression Endocrine: negative for temperature intolerance      Blood pressure 110/70, pulse 61, height '5\' 5"'$  (1.651 m), weight 222 lb (100.7 kg), last menstrual period 08/06/2022.  Physical Exam Physical Exam General:   Alert and no distress  Skin:   no rash or abnormalities  Lungs:   clear to auscultation bilaterally  Heart:   regular rate and rhythm, S1, S2 normal, no murmur, click, rub or gallop  Breasts:   normal without suspicious masses, skin or nipple changes or axillary nodes  Abdomen:  normal findings: no organomegaly, soft, non-tender and no hernia  Pelvis:  External genitalia: normal general appearance Urinary system: urethral meatus normal and bladder without fullness, nontender Vaginal: normal without tenderness, induration or masses Cervix: normal appearance Adnexa: normal bimanual exam Uterus: anteverted and non-tender, normal size    I have spent a total of 20 minutes of face-to-face time, excluding clinical staff time, reviewing notes and preparing to see patient, ordering tests and/or medications, and counseling the patient.   Data Reviewed Wet Prep  Assessment     1. Vaginal discharge Rx: - Cervicovaginal ancillary only( [place]) - metroNIDAZOLE (METROGEL) 0.75 % vaginal gel; Place 1 Applicatorful vaginally 2 (two) times daily.  Dispense: 70 g; Refill: 0 - tinidazole (TINDAMAX) 500 MG tablet; Take 2 tablets (1,000 mg total) by mouth daily with breakfast.  Dispense: 10 tablet; Refill: 2  2. Frequent headaches Rx: - Ambulatory referral to Neurology  3. History of backache Rx: - ibuprofen (ADVIL) 800 MG tablet; Take 1 tablet (800 mg total) by mouth every 8 (eight) hours as needed.  Dispense: 30 tablet; Refill: 5  4. Obesity (BMI 35.0-39.9 without comorbidity) - weight reduction recommended      Plan   Follow up prn  Orders Placed This Encounter  Procedures   Ambulatory referral to Neurology    Referral Priority:   Routine    Referral Type:   Consultation    Referral Reason:   Specialty Services Required    Requested Specialty:   Neurology    Number of Visits Requested:   1   Meds ordered this encounter  Medications   ibuprofen (ADVIL) 800 MG tablet    Sig: Take 1 tablet (800 mg total) by mouth every 8 (eight) hours as needed.    Dispense:  30 tablet    Refill:  5   metroNIDAZOLE (METROGEL) 0.75 % vaginal gel    Sig: Place 1 Applicatorful vaginally 2 (two) times daily.    Dispense:  70 g    Refill:  0   tinidazole (TINDAMAX) 500 MG tablet    Sig: Take  2 tablets (1,000 mg total) by mouth daily with breakfast.    Dispense:  10 tablet    Refill:  2     Shelly Bombard, MD 08/18/2022 10:29 AM

## 2022-08-19 LAB — CERVICOVAGINAL ANCILLARY ONLY
Bacterial Vaginitis (gardnerella): POSITIVE — AB
Candida Glabrata: NEGATIVE
Candida Vaginitis: POSITIVE — AB
Chlamydia: NEGATIVE
Comment: NEGATIVE
Comment: NEGATIVE
Comment: NEGATIVE
Comment: NEGATIVE
Comment: NEGATIVE
Comment: NORMAL
Neisseria Gonorrhea: NEGATIVE
Trichomonas: NEGATIVE

## 2022-08-20 ENCOUNTER — Other Ambulatory Visit: Payer: Self-pay | Admitting: Obstetrics

## 2022-08-20 DIAGNOSIS — B3731 Acute candidiasis of vulva and vagina: Secondary | ICD-10-CM

## 2022-08-20 MED ORDER — FLUCONAZOLE 150 MG PO TABS: 150.0000 mg | ORAL_TABLET | Freq: Once | ORAL | 0 refills | Status: AC

## 2022-08-24 ENCOUNTER — Ambulatory Visit: Payer: Medicaid Other | Admitting: Infectious Diseases

## 2022-08-24 NOTE — Progress Notes (Signed)
TC. No answer. HIPAA compliant VM left with call back number and indication MyChart message would be sent. Message including DX, RX info and education sent.

## 2022-08-25 ENCOUNTER — Telehealth: Payer: Self-pay | Admitting: Emergency Medicine

## 2022-08-25 NOTE — Telephone Encounter (Signed)
-----   Message from Penny Pia, RN sent at 08/24/2022  3:55 PM EDT ----- TC. No answer. HIPAA compliant VM left with call back number and indication MyChart message would be sent. Message including DX, RX info and education sent.

## 2022-08-25 NOTE — Telephone Encounter (Signed)
TC to patient, LVM. Patient informed to check MyChart.

## 2022-09-06 ENCOUNTER — Ambulatory Visit: Payer: Medicaid Other | Admitting: Obstetrics

## 2022-09-07 ENCOUNTER — Ambulatory Visit (INDEPENDENT_AMBULATORY_CARE_PROVIDER_SITE_OTHER): Payer: Medicaid Other | Admitting: Obstetrics

## 2022-09-07 ENCOUNTER — Encounter: Payer: Self-pay | Admitting: Obstetrics

## 2022-09-07 ENCOUNTER — Other Ambulatory Visit (HOSPITAL_COMMUNITY)
Admission: RE | Admit: 2022-09-07 | Discharge: 2022-09-07 | Disposition: A | Discharge status: Home / Self Care | Payer: Medicaid Other | Source: Ambulatory Visit | Attending: Obstetrics | Admitting: Obstetrics

## 2022-09-07 VITALS — BP 114/69 | HR 59 | Ht 65.0 in | Wt 221.3 lb

## 2022-09-07 DIAGNOSIS — Z113 Encounter for screening for infections with a predominantly sexual mode of transmission: Secondary | ICD-10-CM

## 2022-09-07 DIAGNOSIS — N898 Other specified noninflammatory disorders of vagina: Secondary | ICD-10-CM

## 2022-09-07 DIAGNOSIS — R102 Pelvic and perineal pain: Secondary | ICD-10-CM

## 2022-09-07 DIAGNOSIS — N730 Acute parametritis and pelvic cellulitis: Secondary | ICD-10-CM | POA: Diagnosis not present

## 2022-09-07 MED ORDER — DOXYCYCLINE HYCLATE 100 MG PO CAPS: 100.0000 mg | ORAL_CAPSULE | Freq: Two times a day (BID) | ORAL | 0 refills | Status: DC

## 2022-09-07 MED ORDER — METRONIDAZOLE 500 MG PO TABS: 500.0000 mg | ORAL_TABLET | Freq: Two times a day (BID) | ORAL | 0 refills | Status: DC

## 2022-09-07 NOTE — Progress Notes (Signed)
Patient ID: Diamond Burton, female   DOB: 11-18-1985, 37 y.o.   MRN: [id number]  Chief Complaint  Patient presents with   Pelvic Pain    HPI Diamond Burton is a 37 y.o. female.  Complains of RLQ pelvic pain.  She is positive HIV, and has a history of PID.  She also c/o a white clunky discharge.  Denies dysuria, fever/chills HPI  Past Medical History:  Diagnosis Date   Abnormal Pap smear    Bipolar disorder ([place])    not taking meds due to preg   Chlamydia infection affecting pregnancy in first trimester 10/10/2018   Depression    stopped meds, was wanting to see counselor. 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   History of gonorrhea    Hydradenitis    Infection    UTI   Kidney stones    Ovarian cyst    Periodontal disease    PID (pelvic inflammatory disease)    PTSD (post-traumatic stress disorder)    Trichomonas contact     Past Surgical History:  Procedure Laterality Date   HYDRADENITIS EXCISION  03/2019   IRRIGATION AND DEBRIDEMENT ABSCESS Left 03/20/2019   Procedure: IRRIGATION AND DEBRIDEMENT BREAST ABSCESS;  Surgeon: Coralie Keens, MD;  Location: [facility];  Service: General;  Laterality: Left;   NO PAST SURGERIES      Family History  Problem Relation Age of Onset   Depression Mother    Diabetes Mother    Hypertension Mother    Heart disease Mother    Asthma Sister    Asthma Daughter    Hypertension Maternal Grandmother    Diabetes Maternal Grandmother     Social History Social History   Tobacco Use   Smoking status: Former    Packs/day: 0.50    Years: 2.00    Additional pack years: 0.00    Total pack years: 1.00     Types: Cigarettes    Passive exposure: Never   Smokeless tobacco: Never  Vaping Use   Vaping Use: Never used  Substance Use Topics   Alcohol use: Yes    Comment: Occ   Drug use: No    Allergies  Allergen Reactions   Latex     Current Outpatient Medications  Medication Sig Dispense Refill   doxycycline (VIBRAMYCIN) 100 MG capsule Take 1 capsule (100 mg total) by mouth 2 (two) times daily. 28 capsule 0   ibuprofen (ADVIL) 800 MG tablet Take 1 tablet (800 mg total) by mouth every 8 (eight) hours as needed. 30 tablet 5   PARoxetine (PAXIL) 20 MG tablet Take 20 mg by mouth daily.     valACYclovir (VALTREX) 500 MG tablet TAKE 1 TABLET BY MOUTH TWICE A DAY 60 tablet 6   ziprasidone (GEODON) 20 MG capsule Take by mouth.     acetaminophen (TYLENOL) 325 MG tablet Take 2 tablets (650 mg total) by mouth every 4 (four) hours as needed (for pain scale < 4). (Patient not taking: Reported on 02/18/2022)     elvitegravir-cobicistat-emtricitabine-tenofovir (GENVOYA) 150-150-200-10 MG TABS tablet Take 1 tablet by  mouth daily with breakfast. (Patient not taking: Reported on 07/19/2022) 30 tablet 11   metroNIDAZOLE (FLAGYL) 500 MG tablet Take 1 tablet (500 mg total) by mouth 2 (two) times daily. 28 tablet 0   metroNIDAZOLE (METROGEL) 0.75 % vaginal gel Place 1 Applicatorful vaginally 2 (two) times daily. (Patient not taking: Reported on 09/07/2022) 70 g 0   spironolactone (ALDACTONE) 50 MG tablet Take 50 mg by mouth daily. (Patient not taking: Reported on 09/07/2022)     tinidazole (TINDAMAX) 500 MG tablet Take 2 tablets (1,000 mg total) by mouth daily with breakfast. (Patient not taking: Reported on 09/07/2022) 10 tablet 2   traMADol (ULTRAM) 50 MG tablet Take 1 tablet (50 mg total) by mouth every 6 (six) hours as needed. (Patient not taking: Reported on 07/19/2022) 15 tablet 0   No current facility-administered medications for this visit.    Review of Systems Review of Systems Constitutional: negative  for fatigue and weight loss Respiratory: negative for cough and wheezing Cardiovascular: negative for chest pain, fatigue and palpitations Gastrointestinal: negative for abdominal pain and change in bowel habits Genitourinary: positive for vaginal discharge and pelvic pain Integument/breast: negative for nipple discharge Musculoskeletal:negative for myalgias Neurological: negative for gait problems and tremors Behavioral/Psych: negative for abusive relationship, depression Endocrine: negative for temperature intolerance      Blood pressure 114/69, pulse (!) 59, height 5\' 5"  (1.651 m), weight 221 lb 4.8 oz (100.4 kg), last menstrual period 08/06/2022.  Physical Exam Physical Exam General:   Alert and no distress  Skin:   no rash or abnormalities  Lungs:   clear to auscultation bilaterally  Heart:   regular rate and rhythm, S1, S2 normal, no murmur, click, rub or gallop  Breasts:   normal without suspicious masses, skin or nipple changes or axillary nodes  Abdomen:  normal findings: no organomegaly, soft, non-tender and no hernia  Pelvis:  External genitalia: normal general appearance Urinary system: urethral meatus normal and bladder without fullness, nontender Vaginal: normal without tenderness, induration or masses Cervix: normal appearance Adnexa: normal bimanual exam Uterus: anteverted and non-tender, normal size    I have spent a total of 20 minutes of face-to-face time, excluding clinical staff time, reviewing notes and preparing to see patient, ordering tests and/or medications, and counseling the patient.   Data Reviewed Wet Prep  Assessment     1. Pelvic pain Rx: - US PELVIC COMPLETE WITH TRANSVAGINAL; Future  2. Vaginal discharge Rx - Cervicovaginal ancillary only( [place])  3. PID (acute pelvic inflammatory disease) Rx: - metroNIDAZOLE (FLAGYL) 500 MG tablet; Take 1 tablet (500 mg total) by mouth 2 (two) times daily.  Dispense: 28 tablet; Refill: 0 -  doxycycline (VIBRAMYCIN) 100 MG capsule; Take 1 capsule (100 mg total) by mouth 2 (two) times daily.  Dispense: 28 capsule; Refill: 0  4. Screen for STD (sexually transmitted disease) Rx: - RPR     Plan   Follow up in 2 weeks  Orders Placed This Encounter  Procedures   RPR   Meds ordered this encounter  Medications   metroNIDAZOLE (FLAGYL) 500 MG tablet    Sig: Take 1 tablet (500 mg total) by mouth 2 (two) times daily.    Dispense:  28 tablet    Refill:  0   doxycycline (VIBRAMYCIN) 100 MG capsule    Sig: Take 1 capsule (100 mg total) by mouth 2 (two) times daily.    Dispense:  28 capsule    Refill:  0  Shelly Bombard, MD 09/07/2022 11:53 AM

## 2022-09-07 NOTE — Progress Notes (Signed)
Pt presents for post coital pelvic pain on the right side. Pelvic pain with activity. Reports hx of PID, and states it feels the same. Recently treated for yeast and BV, but reports white chunky discharge.

## 2022-09-08 LAB — CERVICOVAGINAL ANCILLARY ONLY
Bacterial Vaginitis (gardnerella): NEGATIVE
Candida Glabrata: NEGATIVE
Candida Vaginitis: POSITIVE — AB
Chlamydia: NEGATIVE
Comment: NEGATIVE
Comment: NEGATIVE
Comment: NEGATIVE
Comment: NEGATIVE
Comment: NEGATIVE
Comment: NORMAL
Neisseria Gonorrhea: NEGATIVE
Trichomonas: NEGATIVE

## 2022-09-08 LAB — RPR: RPR Ser Ql: NONREACTIVE

## 2022-09-12 ENCOUNTER — Other Ambulatory Visit: Payer: Self-pay | Admitting: Obstetrics

## 2022-09-12 DIAGNOSIS — B3731 Acute candidiasis of vulva and vagina: Secondary | ICD-10-CM

## 2022-09-12 MED ORDER — FLUCONAZOLE 150 MG PO TABS: 150.0000 mg | ORAL_TABLET | Freq: Once | ORAL | 0 refills | Status: AC

## 2022-09-15 ENCOUNTER — Ambulatory Visit (HOSPITAL_BASED_OUTPATIENT_CLINIC_OR_DEPARTMENT_OTHER)
Admission: RE | Admit: 2022-09-15 | Discharge: 2022-09-15 | Disposition: A | Discharge status: Home / Self Care | Payer: Medicaid Other | Source: Ambulatory Visit | Attending: Obstetrics | Admitting: Obstetrics

## 2022-09-15 DIAGNOSIS — R102 Pelvic and perineal pain: Secondary | ICD-10-CM | POA: Insufficient documentation

## 2022-09-15 DIAGNOSIS — N939 Abnormal uterine and vaginal bleeding, unspecified: Secondary | ICD-10-CM | POA: Insufficient documentation

## 2022-10-17 ENCOUNTER — Encounter: Payer: Self-pay | Admitting: Diagnostic Neuroimaging

## 2022-10-17 ENCOUNTER — Ambulatory Visit: Payer: Medicaid Other | Admitting: Diagnostic Neuroimaging

## 2022-12-12 ENCOUNTER — Ambulatory Visit: Payer: Medicaid Other | Admitting: Infectious Diseases

## 2023-01-23 ENCOUNTER — Other Ambulatory Visit: Payer: Self-pay | Admitting: Obstetrics and Gynecology

## 2023-01-27 ENCOUNTER — Ambulatory Visit: Payer: Medicaid Other

## 2023-01-31 ENCOUNTER — Ambulatory Visit: Payer: Medicaid Other | Admitting: Infectious Diseases

## 2023-02-27 ENCOUNTER — Other Ambulatory Visit: Payer: Self-pay | Admitting: Infectious Diseases

## 2023-02-27 DIAGNOSIS — Z21 Asymptomatic human immunodeficiency virus [HIV] infection status: Secondary | ICD-10-CM

## 2023-02-27 NOTE — Telephone Encounter (Signed)
Appointment 9/19

## 2023-03-02 ENCOUNTER — Other Ambulatory Visit (HOSPITAL_COMMUNITY)
Admission: RE | Admit: 2023-03-02 | Discharge: 2023-03-02 | Disposition: A | Discharge status: Home / Self Care | Payer: Medicaid Other | Source: Ambulatory Visit | Attending: Infectious Diseases | Admitting: Infectious Diseases

## 2023-03-02 ENCOUNTER — Other Ambulatory Visit: Payer: Self-pay

## 2023-03-02 ENCOUNTER — Ambulatory Visit (INDEPENDENT_AMBULATORY_CARE_PROVIDER_SITE_OTHER): Payer: Medicaid Other | Admitting: Pharmacist

## 2023-03-02 DIAGNOSIS — Z113 Encounter for screening for infections with a predominantly sexual mode of transmission: Secondary | ICD-10-CM | POA: Insufficient documentation

## 2023-03-02 DIAGNOSIS — Z21 Asymptomatic human immunodeficiency virus [HIV] infection status: Secondary | ICD-10-CM | POA: Diagnosis present

## 2023-03-02 LAB — POCT URINE PREGNANCY: Preg Test, Ur: NEGATIVE

## 2023-03-02 NOTE — Patient Instructions (Signed)
Look up Biktarvy - let me know next time if you're interested in it!

## 2023-03-02 NOTE — Progress Notes (Signed)
HPI: Diamond Burton is a 37 y.o. female who presents to the [facility] pharmacy clinic for HIV follow-up.  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   DOXYCYCLINE (VIBRAMYCIN) 100 MG CAPSULE    Take 1 capsule (100 mg total) by mouth 2 (two) times daily.   ELVITEGRAVIR-COBICISTAT-EMTRICITABINE-TENOFOVIR (GENVOYA) 150-150-200-10 MG TABS TABLET    Take 1 tablet by mouth daily with breakfast.   IBUPROFEN (ADVIL) 800 MG TABLET    Take 1 tablet (800 mg total) by mouth every 8 (eight) hours as needed.   METRONIDAZOLE (FLAGYL) 500 MG TABLET    Take 1 tablet (500 mg total) by mouth 2 (two) times daily.   METRONIDAZOLE (METROGEL) 0.75 % VAGINAL GEL    Place 1 Applicatorful vaginally 2 (two) times daily.   PAROXETINE (PAXIL) 20 MG TABLET    Take 20 mg by mouth daily.   SPIRONOLACTONE (ALDACTONE) 50 MG TABLET    Take 50 mg by mouth daily.   TINIDAZOLE (TINDAMAX) 500 MG TABLET    Take 2 tablets (1,000 mg total) by mouth daily with breakfast.   TRAMADOL (ULTRAM) 50 MG TABLET    Take 1 tablet (50 mg total) by mouth every 6 (six) hours as needed.   VALACYCLOVIR (VALTREX) 500 MG TABLET    TAKE 1 TABLET BY MOUTH TWICE A DAY   ZIPRASIDONE (GEODON) 20 MG CAPSULE    Take by mouth.  Modified Medications   No  medications on file  Discontinued Medications   No medications on file    Allergies: Allergies  Allergen Reactions   Latex     Past Medical History: Past Medical History:  Diagnosis Date   Abnormal Pap smear    Bipolar disorder ([facility])    not taking meds due to preg   Chlamydia infection affecting pregnancy in first trimester 10/10/2018   Depression    stopped meds, was wanting to see counselor. 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   History of gonorrhea    Hydradenitis    Infection    UTI   Kidney stones    Ovarian cyst    Periodontal disease    PID (pelvic inflammatory disease)    PTSD (post-traumatic stress disorder)    Trichomonas contact     Social History: Social History   Socioeconomic History   Marital status:  Single    Spouse name: Not on file   Number of children: 3   Years of education: Not on file   Highest education level: Not on file  Occupational History   Not on file  Tobacco Use   Smoking status: Former    Current packs/day: 0.50    Average packs/day: 0.5 packs/day for 2.0 years (1.0 ttl pk-yrs)    Types: Cigarettes    Passive exposure: Never   Smokeless tobacco: Never  Vaping Use   Vaping status: Never Used  Substance and Sexual Activity   Alcohol use: Yes    Comment: Occ   Drug use: No   Sexual activity: Yes    Partners: Male    Birth control/protection: None  Other Topics Concern   Not on file  Social History Narrative   Lives with family   Right handed    Caffeine use: tea daily   Social Determinants of Health   Financial Resource Strain: Not on file  Food Insecurity: Not on file   Transportation Needs: Not on file  Physical Activity: Not on file  Stress: Not on file  Social Connections: Not on file    Labs: Lab Results  Component Value Date   HIV1RNAQUANT <20 (H) 04/27/2022   HIV1RNAQUANT Not Detected 01/04/2022   HIV1RNAQUANT 61 (H) 09/14/2021   CD4TABS 1,331 01/04/2022    RPR and STI Lab Results  Component Value Date   LABRPR Non Reactive 09/07/2022   LABRPR Non Reactive 07/19/2022   LABRPR NON-REACTIVE 04/27/2022   LABRPR NON-REACTIVE 11/11/2021   LABRPR NON-REACTIVE 09/14/2021    STI Results GC CT  09/07/2022 11:28 AM Negative  Negative   08/18/2022 10:26 AM Negative  Negative   07/19/2022 10:47 AM Negative  Negative   04/27/2022  4:25 PM Negative  Negative   04/06/2022 11:08 AM Negative  Negative   03/07/2022 11:57 AM Negative  Negative   02/18/2022 12:00 PM Negative  Negative   11/11/2021  9:22 AM Negative    Negative  Negative    Negative   07/21/2021 10:41 AM Negative  Negative   06/08/2021  2:23 PM Negative  Negative   04/20/2021  1:12 PM Negative  Negative   03/31/2021  5:09 PM Negative  Negative   03/03/2021 11:30 AM Negative  Negative   02/03/2021  2:45 PM Negative  Positive   12/25/2020  9:54 AM Negative  Positive   11/23/2020  2:41 PM Negative  Negative   11/02/2020 11:08 AM Negative  Positive   09/09/2020 10:24 AM Negative  Negative   08/10/2020 10:33 AM Negative  Negative   07/15/2020 11:22 AM Negative  Negative     Hepatitis B Lab Results  Component Value Date   HEPBSAB NON-REACTIVE 01/04/2022   HEPBSAG Negative 07/19/2022   Hepatitis C No results found for: "HEPCAB", "HCVRNAPCRQN" Hepatitis A No results found for: "HAV" Lipids: No results found for: "CHOL", "TRIG", "HDL", "CHOLHDL", "VLDL", "LDLCALC"  Current HIV Regimen: Genvoya   Assessment: Diamond Burton presents to clinic today for HIV follow-up after multiple missed appointments with Diamond Burton; she last followed up with Diamond Burton in November 2023. States she  takes [name] daily and only misses doses "every once and a while"; her fill history remains consistent. She states she still experiences fatigue and weight gain from Diamond Burton but would like to research alternative options before considering any switch. We discussed the benefits with Biktarvy in detail; she can follow up on this potential transition with Diamond Burton at her next visit. Refills  sent to [facility] today; states she was out of medicine last night. Will check routine labs including HIV RNA with genotype, CD4 count, CMP, CBC, lipid profile, POC pregnancy test, RPR, and urine/oral cytologies. Follow up with Diamond Burton in 1-2 months.  She endorses abdominal swelling and "movement in my stomach"; concerned she is pregnant despite consistent menstrual cycle. POC pregnancy test returned negative today. She endorses social alcohol use. Also describes experiencing shortness of breath when bending over along with a tense knot in her right lower back. Also endorses left leg tingling and numbness when she is driving or standing up for too long. Encouraged her to follow up with her primary care physician about these but will also forward to [facility] for any recommendations.   Due for multiple vaccinations today including HBV, flu, COVID, Menveo, and PCV20. She deferred all today given she was busy watching two of her children during the appointment.   Plan: - Continue Genvoya (refills sent) - Consider transition to Biktarvy at next office visit - Check HIV RNA, CD4, CMP, CBC, lipid profile, POC pregnancy test, RPR, and urine/oral cytologies - Follow up with Diamond Burton on 11/11   Margarite Gouge, PharmD, CPP, BCIDP, AAHIVP Clinical Pharmacist Practitioner Infectious Diseases Clinical Pharmacist [facility] 03/02/2023, 11:27 AM

## 2023-03-03 LAB — URINE CYTOLOGY ANCILLARY ONLY
Chlamydia: NEGATIVE
Comment: NEGATIVE
Comment: NEGATIVE
Comment: NORMAL
Neisseria Gonorrhea: NEGATIVE
Trichomonas: NEGATIVE

## 2023-03-03 LAB — CYTOLOGY, (ORAL, ANAL, URETHRAL) ANCILLARY ONLY
Chlamydia: NEGATIVE
Comment: NEGATIVE
Comment: NEGATIVE
Comment: NORMAL
Neisseria Gonorrhea: NEGATIVE
Trichomonas: NEGATIVE

## 2023-03-03 LAB — T-HELPER CELLS (CD4) COUNT (NOT AT ARMC)
CD4 % Helper T Cell: 47 % (ref 33–65)
CD4 T Cell Abs: 1692 /uL (ref 400–1790)

## 2023-03-05 LAB — COMPREHENSIVE METABOLIC PANEL
AG Ratio: 1.2 (calc) (ref 1.0–2.5)
ALT: 18 U/L (ref 6–29)
AST: 15 U/L (ref 10–30)
Albumin: 4.2 g/dL (ref 3.6–5.1)
Alkaline phosphatase (APISO): 112 U/L (ref 31–125)
BUN: 10 mg/dL (ref 7–25)
CO2: 23 mmol/L (ref 20–32)
Calcium: 9.5 mg/dL (ref 8.6–10.2)
Chloride: 103 mmol/L (ref 98–110)
Creat: 0.7 mg/dL (ref 0.50–0.97)
Globulin: 3.6 g/dL (calc) (ref 1.9–3.7)
Glucose, Bld: 85 mg/dL (ref 65–99)
Potassium: 4.3 mmol/L (ref 3.5–5.3)
Sodium: 136 mmol/L (ref 135–146)
Total Bilirubin: 0.3 mg/dL (ref 0.2–1.2)
Total Protein: 7.8 g/dL (ref 6.1–8.1)

## 2023-03-05 LAB — CBC WITH DIFFERENTIAL/PLATELET
Absolute Monocytes: 656 cells/uL (ref 200–950)
Basophils Absolute: 86 cells/uL (ref 0–200)
Basophils Relative: 0.9 %
Eosinophils Absolute: 114 cells/uL (ref 15–500)
Eosinophils Relative: 1.2 %
HCT: 42.5 % (ref 35.0–45.0)
Hemoglobin: 14.1 g/dL (ref 11.7–15.5)
Lymphs Abs: 3705 cells/uL (ref 850–3900)
MCH: 30.7 pg (ref 27.0–33.0)
MCHC: 33.2 g/dL (ref 32.0–36.0)
MCV: 92.4 fL (ref 80.0–100.0)
MPV: 10.7 fL (ref 7.5–12.5)
Monocytes Relative: 6.9 %
Neutro Abs: 4940 cells/uL (ref 1500–7800)
Neutrophils Relative %: 52 %
Platelets: 374 10*3/uL (ref 140–400)
RBC: 4.6 10*6/uL (ref 3.80–5.10)
RDW: 12.4 % (ref 11.0–15.0)
Total Lymphocyte: 39 %
WBC: 9.5 10*3/uL (ref 3.8–10.8)

## 2023-03-05 LAB — LIPID PANEL
Cholesterol: 189 mg/dL (ref <200)
HDL: 44 mg/dL — ABNORMAL LOW (ref >=50)
LDL Cholesterol (Calc): 122 mg/dL (calc) — ABNORMAL HIGH
Non-HDL Cholesterol (Calc): 145 mg/dL (calc) — ABNORMAL HIGH (ref <130)
Total CHOL/HDL Ratio: 4.3 (calc) (ref <5.0)
Triglycerides: 120 mg/dL (ref <150)

## 2023-03-05 LAB — RPR: RPR Ser Ql: NONREACTIVE

## 2023-03-05 LAB — HIV RNA, RTPCR W/R GT (RTI, PI,INT)
HIV 1 RNA Quant: NOT DETECTED copies/mL
HIV-1 RNA Quant, Log: NOT DETECTED Log copies/mL

## 2023-04-13 ENCOUNTER — Ambulatory Visit (INDEPENDENT_AMBULATORY_CARE_PROVIDER_SITE_OTHER): Payer: Medicaid Other | Admitting: Obstetrics and Gynecology

## 2023-04-13 ENCOUNTER — Encounter: Payer: Self-pay | Admitting: Obstetrics and Gynecology

## 2023-04-13 ENCOUNTER — Other Ambulatory Visit (HOSPITAL_COMMUNITY)
Admission: RE | Admit: 2023-04-13 | Discharge: 2023-04-13 | Disposition: A | Discharge status: Home / Self Care | Payer: Medicaid Other | Source: Ambulatory Visit | Attending: Obstetrics and Gynecology | Admitting: Obstetrics and Gynecology

## 2023-04-13 VITALS — BP 114/68 | HR 72 | Wt 224.0 lb

## 2023-04-13 DIAGNOSIS — B9689 Other specified bacterial agents as the cause of diseases classified elsewhere: Secondary | ICD-10-CM

## 2023-04-13 DIAGNOSIS — N760 Acute vaginitis: Secondary | ICD-10-CM

## 2023-04-13 DIAGNOSIS — R350 Frequency of micturition: Secondary | ICD-10-CM

## 2023-04-13 DIAGNOSIS — Z113 Encounter for screening for infections with a predominantly sexual mode of transmission: Secondary | ICD-10-CM | POA: Insufficient documentation

## 2023-04-13 LAB — POCT URINE PREGNANCY: Preg Test, Ur: NEGATIVE

## 2023-04-13 NOTE — Progress Notes (Signed)
  GYNECOLOGY PROGRESS NOTE  History:  [name] is a 37 y.o. [id number] presents to [facility] office today for problem gyn visit. She reports frequent urination and desires to be tested for STDs. She is HIV (+), HSV(+).  She denies h/a, dizziness, shortness of breath, n/v, or fever/chills.    The following portions of the patient's history were reviewed and updated as appropriate: allergies, current medications, past family history, past medical history, past social history, past surgical history and problem list. Last pap smear on 07/19/2022 was normal, negative HRHPV.  Review of Systems:  Pertinent items are noted in HPI.   Objective:  Physical Exam Blood pressure 114/68, pulse 72, weight 224 lb (101.6 kg), last menstrual period 04/04/2023. VS reviewed, nursing note reviewed,  Constitutional: well developed, well nourished, no distress HEENT: normocephalic CV: normal rate Pulm/chest wall: normal effort Breast Exam: deferred Abdomen: soft Neuro: alert and oriented x 3 Skin: warm, dry Psych: affect normal Pelvic exam: Cervix pink, visually closed, without lesion, scant white creamy discharge, vaginal walls and external genitalia normal Bimanual exam: deferred  Assessment & Plan:  1. Screening for STD (sexually transmitted disease) - Wet Prep - Blood work declined d/t testing with ID provider  2. Frequent urination - CCUA - UCx -- Results pending   Total face-to-face time spent during this encounter was 10 minutes. There was 5 minutes of chart review time spent prior to this encounter. Total time spent = 15 minutes.   Diamond Burton, CNM 9:00 AM

## 2023-04-13 NOTE — Addendum Note (Signed)
Addended by: Jearld Adjutant on: 04/13/2023 04:44 PM   Modules accepted: Orders

## 2023-04-14 ENCOUNTER — Encounter: Payer: Self-pay | Admitting: Obstetrics and Gynecology

## 2023-04-14 LAB — CERVICOVAGINAL ANCILLARY ONLY
Bacterial Vaginitis (gardnerella): POSITIVE — AB
Candida Glabrata: NEGATIVE
Candida Vaginitis: NEGATIVE
Chlamydia: NEGATIVE
Comment: NEGATIVE
Comment: NEGATIVE
Comment: NEGATIVE
Comment: NEGATIVE
Comment: NEGATIVE
Comment: NORMAL
Neisseria Gonorrhea: NEGATIVE
Trichomonas: NEGATIVE

## 2023-04-14 MED ORDER — METRONIDAZOLE 500 MG PO TABS
500.0000 mg | ORAL_TABLET | Freq: Two times a day (BID) | ORAL | 0 refills | Status: DC
Start: 2023-04-14 — End: 2023-11-09

## 2023-04-14 NOTE — Addendum Note (Signed)
Addended by: Kenard Gower on: 04/14/2023 03:24 PM   Modules accepted: Orders

## 2023-04-24 ENCOUNTER — Ambulatory Visit: Payer: Medicaid Other | Admitting: Infectious Diseases

## 2023-05-01 ENCOUNTER — Ambulatory Visit: Payer: Medicaid Other | Admitting: Infectious Diseases

## 2023-05-04 ENCOUNTER — Encounter: Payer: Self-pay | Admitting: Infectious Diseases

## 2023-05-04 ENCOUNTER — Other Ambulatory Visit: Payer: Self-pay

## 2023-05-04 ENCOUNTER — Ambulatory Visit: Payer: Medicaid Other | Admitting: Infectious Diseases

## 2023-05-04 ENCOUNTER — Other Ambulatory Visit (HOSPITAL_COMMUNITY)
Admission: RE | Admit: 2023-05-04 | Discharge: 2023-05-04 | Disposition: A | Discharge status: Home / Self Care | Payer: Medicaid Other | Source: Ambulatory Visit | Attending: Infectious Diseases | Admitting: Infectious Diseases

## 2023-05-04 VITALS — BP 128/79 | HR 74 | Temp 97.0°F | Ht 65.0 in | Wt 225.0 lb

## 2023-05-04 DIAGNOSIS — Z21 Asymptomatic human immunodeficiency virus [HIV] infection status: Secondary | ICD-10-CM

## 2023-05-04 DIAGNOSIS — R232 Flushing: Secondary | ICD-10-CM | POA: Diagnosis not present

## 2023-05-04 DIAGNOSIS — N949 Unspecified condition associated with female genital organs and menstrual cycle: Secondary | ICD-10-CM | POA: Diagnosis present

## 2023-05-04 DIAGNOSIS — B20 Human immunodeficiency virus [HIV] disease: Secondary | ICD-10-CM | POA: Diagnosis not present

## 2023-05-04 NOTE — Patient Instructions (Addendum)
Delstrigo or Dovato - two other options for your treatment to read about.   Do the nightly boric acid rinses for 5 days after your period to see if that helps prevent the bacterial infections.   OK to take the cumin and ginger as long as it is separated from the [facility].

## 2023-05-04 NOTE — Progress Notes (Addendum)
Name: Diamond Burton  DOB: 1986-01-04 MRN: [id number] PCP: [facility]    Brief Narrative:  Diamond Burton is a 37 y.o. female with HIV disease, diagnosed 07/2021. CD4 1154 VL 34,100 HIV Risk: sexual History of OIs: none Intake Labs pending: Hep B sAg (-), sAb (), cAb (); Hep A (), Hep C (-) Quantiferon () HLA B*5701 () G6PD: ()   Previous Regimens: Genvoya 2023  Genotypes: 07/2021 sensitive    Subjective:   Chief Complaint  Patient presents with   Follow-up    B20     Discussed the use of AI scribe software for clinical note transcription with the patient, who gave verbal consent to proceed.  History of Present Illness   Diamond Burton is here today for HIV follow up. She reported adherence to her antiretroviral therapy, Genvoya, with an undetectable viral load 2 months ago when she saw our pharmacy team. However, she expressed concerns about persistent fatigue, which she attributed to the medication. She also reported an intolerance to alcohol since starting the medication, experiencing sickness after consumption.   The patient also reported experiencing depressive symptoms, which she manages through religious practices and reading the Bible. She is currently on Paxil for depression, but has noticed side effects such as hair loss and dry skin. She also reported experiencing vivid dreams, which she believes to be a side effect of her medication. She goes on and off the medication only when she feels her mood is at risk and she has thoughts of SI. Her psychiatrist has told her to take it continuously.   In addition to her HIV and mental health concerns, the patient has been diagnosed with Hidradenitis Suppurativa (HS), a chronic skin condition. She has been managing this condition with home remedies, including Manuka honey and turmeric/ginger, which she reported to be effective in healing open wounds and reducing inflammation.  The patient also reported experiencing  neuropathic symptoms, including soreness and cramping in her feet and hands. She believes these symptoms are related to her HIV status or medication.  This has improved with the tumeric supplements.   The patient has also been experiencing changes in her menstrual cycle, which she believes may be indicative of perimenopause. She reported a decrease in sexual desire, which she attributes to both her HIV diagnosis and potential perimenopausal changes. Her mother passed away young at 27 yo and she knows she had a hysterectomy so not clear when she may have stopped her cycles.   She expressed a desire to continue her current antiretroviral therapy due to concerns about potential side effects of other medications. She also expressed a desire to be retested for bacterial vaginosis, a condition she was previously treated for. All vaginal drainage has resolved. She uses a boric acid solution to deuche with occasionally.         05/04/2023   11:12 AM  Depression screen PHQ 2/9  Decreased Interest 1  Down, Depressed, Hopeless 1  PHQ - 2 Score 2  Altered sleeping 3  Tired, decreased energy 2  Change in appetite 2  Feeling bad or failure about yourself  1  Trouble concentrating 1  Moving slowly or fidgety/restless 1  Suicidal thoughts 1   PHQ-9 Score 13  Difficult doing work/chores Somewhat difficult     Significant value    Review of Systems  Constitutional:  Negative for appetite change, chills, fatigue, fever and unexpected weight change.  HENT:  Negative for mouth sores, sore throat and trouble swallowing.  Eyes:  Negative for pain and visual disturbance.  Respiratory:  Negative for cough and shortness of breath.   Cardiovascular:  Negative for chest pain.  Gastrointestinal:  Negative for abdominal pain, diarrhea and nausea.  Genitourinary:  Negative for dysuria, menstrual problem and pelvic pain.  Musculoskeletal:  Negative for back pain and neck pain.  Skin:  Negative for color change  and rash.  Neurological:  Negative for weakness, numbness and headaches.  Hematological:  Negative for adenopathy.  Psychiatric/Behavioral:  Positive for sleep disturbance. Negative for dysphoric mood. The patient is nervous/anxious.     Outpatient Medications Prior to Visit  Medication Sig Dispense Refill   elvitegravir-cobicistat-emtricitabine-tenofovir (GENVOYA) 150-150-200-10 MG TABS tablet TAKE 1 TABLET BY MOUTH EVERY DAY WITH BREAKFAST 90 tablet 1   ibuprofen (ADVIL) 800 MG tablet Take 1 tablet (800 mg total) by mouth every 8 (eight) hours as needed. 30 tablet 5   PARoxetine (PAXIL) 20 MG tablet Take 20 mg by mouth daily.     valACYclovir (VALTREX) 500 MG tablet TAKE 1 TABLET BY MOUTH TWICE A DAY 60 tablet 6   ziprasidone (GEODON) 20 MG capsule Take by mouth.     metroNIDAZOLE (FLAGYL) 500 MG tablet Take 1 tablet (500 mg total) by mouth 2 (two) times daily. (Patient not taking: Reported on 05/04/2023) 14 tablet 0   No facility-administered medications prior to visit.     Allergies  Allergen Reactions   Latex     Social History   Tobacco Use   Smoking status: Some Days    Current packs/day: 0.50    Average packs/day: 0.5 packs/day for 2.0 years (1.0 ttl pk-yrs)    Types: Cigarettes    Passive exposure: Never   Smokeless tobacco: Never  Vaping Use   Vaping status: Never Used  Substance Use Topics   Alcohol use: Not Currently    Comment: Occ   Drug use: No     Social History   Substance and Sexual Activity  Sexual Activity Yes   Partners: Male   Birth control/protection: None     Objective:   Vitals:   05/04/23 1111  BP: 128/79  Pulse: 74  Temp: (!) 97 F (36.1 C)  TempSrc: Temporal  SpO2: 95%  Weight: 225 lb (102.1 kg)  Height: 5\' 5"  (1.651 m)   Body mass index is 37.44 kg/m.  Physical Exam Constitutional:      Appearance: Normal appearance. She is not ill-appearing.  HENT:     Mouth/Throat:     Mouth: Mucous membranes are moist.      Pharynx: Oropharynx is clear.  Eyes:     General: No scleral icterus. Cardiovascular:     Rate and Rhythm: Normal rate and regular rhythm.  Pulmonary:     Effort: Pulmonary effort is normal.  Neurological:     Mental Status: She is oriented to person, place, and time.  Psychiatric:        Mood and Affect: Mood normal.        Thought Content: Thought content normal.     Lab Results Lab Results  Component Value Date   WBC 9.5 03/02/2023   HGB 14.1 03/02/2023   HCT 42.5 03/02/2023   MCV 92.4 03/02/2023   PLT 374 03/02/2023    Lab Results  Component Value Date   CREATININE 0.70 03/02/2023   BUN 10 03/02/2023   NA 136 03/02/2023   K 4.3 03/02/2023   CL 103 03/02/2023   CO2 23 03/02/2023    Lab  Results  Component Value Date   ALT 18 03/02/2023   AST 15 03/02/2023   ALKPHOS 80 03/11/2022   BILITOT 0.3 03/02/2023    Lab Results  Component Value Date   CHOL 189 03/02/2023   HDL 44 (L) 03/02/2023   LDLCALC 122 (H) 03/02/2023   TRIG 120 03/02/2023   CHOLHDL 4.3 03/02/2023   HIV 1 RNA Quant  Date Value  03/02/2023 NOT DETECTED copies/mL  04/27/2022 <20 Copies/mL (H)  01/04/2022 Not Detected Copies/mL   CD4 T Cell Abs (/uL)  Date Value  03/02/2023 1,692  01/04/2022 1,331     Assessment & Plan:   Problem List Items Addressed This Visit       Unprioritized   Vaginal discomfort - Primary   Relevant Orders   Cervicovaginal ancillary only      HIV -  Viral load undetectable on Genvoya. Patient is considering changing medication due to side effects but is concerned about potential side effects of other medications. For now she would prefer to stay on Genvoya since it is working and she is tolerating it.  -Continue Genvoya for now. -Patient to research alternative medications (Delstrigo and Dovato) and contact provider if she decides to switch. -Pap smear up to date  Depression Patient reports suicidal thoughts when not taking medication (Paxil). Patient  also reports fatigue, which may be a side effect of the medication. -Managed by psychiatrist team  -Consider adjusting dose or frequency of Vitamin B12 supplement to manage fatigue. -Possible perimenopause symptoms contributing to worsening. Agree that she should take the paxil continuously as that can cause some significant withdrawl problems   Bacterial Vaginosis, Recurrent -  Patient reports recurrent bacterial vaginosis. Patient has used boric acid rinse in the past with good results. No symptoms present today. Does pattern around her menses  -Collect vaginal swab to confirm resolution of bacterial vaginosis. -Consider regular use of boric acid rinse x 5d, particularly after menstrual periods, to prevent recurrence.  ?Perimenopause Patient reports irregular menstrual cycles, hot flashes, and vision changes, which may be symptoms of perimenopause. -Continue monitoring symptoms. -Consider further evaluation if symptoms become more severe or disruptive.  Follow-up in 6 months (May 2025) or sooner if patient decides to switch HIV medication.      Rexene Alberts, MSN, NP-C [facility] for Infectious Disease [facility] Pager: [phone] Office: [phone]  05/04/23  4:25 PM

## 2023-05-05 LAB — CERVICOVAGINAL ANCILLARY ONLY
Bacterial Vaginitis (gardnerella): NEGATIVE
Candida Glabrata: NEGATIVE
Candida Vaginitis: NEGATIVE
Chlamydia: NEGATIVE
Comment: NEGATIVE
Comment: NEGATIVE
Comment: NEGATIVE
Comment: NEGATIVE
Comment: NEGATIVE
Comment: NORMAL
Neisseria Gonorrhea: NEGATIVE
Trichomonas: NEGATIVE

## 2023-08-08 ENCOUNTER — Ambulatory Visit: Payer: Medicaid Other

## 2023-08-08 ENCOUNTER — Other Ambulatory Visit (HOSPITAL_COMMUNITY)
Admission: RE | Admit: 2023-08-08 | Discharge: 2023-08-08 | Disposition: A | Discharge status: Home / Self Care | Payer: Medicaid Other | Source: Ambulatory Visit | Attending: Obstetrics & Gynecology | Admitting: Obstetrics & Gynecology

## 2023-08-08 VITALS — BP 118/79 | HR 57 | Ht 66.0 in | Wt 218.0 lb

## 2023-08-08 DIAGNOSIS — Z113 Encounter for screening for infections with a predominantly sexual mode of transmission: Secondary | ICD-10-CM

## 2023-08-08 NOTE — Progress Notes (Signed)
 SUBJECTIVE:  38 y.o. female complains of vaginal irritation  for 1 week(s). Denies abnormal vaginal bleeding or significant pelvic pain or fever. No UTI symptoms. Denies history of known exposure to STD.  No LMP recorded.  OBJECTIVE:  She appears well, afebrile. Urine dipstick: not done.  ASSESSMENT:  Vaginal Discharge  Vaginal Odor   PLAN:  GC, chlamydia, trichomonas, BVAG, CVAG probe sent to lab. Treatment: To be determined once lab results are received ROV prn if symptoms persist or worsen.

## 2023-08-09 ENCOUNTER — Other Ambulatory Visit: Payer: Self-pay | Admitting: Obstetrics & Gynecology

## 2023-08-09 ENCOUNTER — Encounter: Payer: Self-pay | Admitting: Obstetrics & Gynecology

## 2023-08-09 DIAGNOSIS — A599 Trichomoniasis, unspecified: Secondary | ICD-10-CM

## 2023-08-09 DIAGNOSIS — B9689 Other specified bacterial agents as the cause of diseases classified elsewhere: Secondary | ICD-10-CM

## 2023-08-09 DIAGNOSIS — B3731 Acute candidiasis of vulva and vagina: Secondary | ICD-10-CM

## 2023-08-09 LAB — CERVICOVAGINAL ANCILLARY ONLY
Bacterial Vaginitis (gardnerella): POSITIVE — AB
Candida Glabrata: NEGATIVE
Candida Vaginitis: POSITIVE — AB
Chlamydia: NEGATIVE
Comment: NEGATIVE
Comment: NEGATIVE
Comment: NEGATIVE
Comment: NEGATIVE
Comment: NEGATIVE
Comment: NORMAL
Neisseria Gonorrhea: NEGATIVE
Trichomonas: POSITIVE — AB

## 2023-08-09 MED ORDER — FLUCONAZOLE 150 MG PO TABS: 150.0000 mg | ORAL_TABLET | Freq: Once | ORAL | 0 refills | Status: AC

## 2023-08-09 MED ORDER — METRONIDAZOLE 500 MG PO TABS: 500.0000 mg | ORAL_TABLET | Freq: Two times a day (BID) | ORAL | 0 refills | Status: DC

## 2023-08-09 NOTE — Progress Notes (Signed)
 Flagyl and diflucan prescribed

## 2023-08-11 LAB — HIV 1/2 AB DIFFERENTIATION
HIV 1 Ab: REACTIVE
HIV 2 Ab: NONREACTIVE
NOTE (HIV CONF MULTIP: POSITIVE — AB

## 2023-08-11 LAB — HIV ANTIBODY (ROUTINE TESTING W REFLEX): HIV Screen 4th Generation wRfx: REACTIVE

## 2023-08-11 LAB — HEPATITIS C ANTIBODY: Hep C Virus Ab: NONREACTIVE

## 2023-08-11 LAB — RPR: RPR Ser Ql: NONREACTIVE

## 2023-08-11 LAB — HEPATITIS B SURFACE ANTIGEN: Hepatitis B Surface Ag: NEGATIVE

## 2023-09-07 ENCOUNTER — Ambulatory Visit

## 2023-09-07 ENCOUNTER — Other Ambulatory Visit (HOSPITAL_COMMUNITY)
Admission: RE | Admit: 2023-09-07 | Discharge: 2023-09-07 | Disposition: A | Discharge status: Home / Self Care | Source: Ambulatory Visit | Attending: Obstetrics and Gynecology | Admitting: Obstetrics and Gynecology

## 2023-09-07 DIAGNOSIS — N898 Other specified noninflammatory disorders of vagina: Secondary | ICD-10-CM | POA: Insufficient documentation

## 2023-09-07 NOTE — Progress Notes (Signed)
..  SUBJECTIVE:  38 y.o. female complains of vaginal discharge. Pt states that she feels like metronidazole does not work for her anymore after taking it for +Trich around 3 weeks ago. Pt states that she still has the same symptoms. Denies abnormal vaginal bleeding or significant pelvic pain or fever. No UTI symptoms. Denies history of known exposure to STD.  No LMP recorded.  OBJECTIVE:  She appears well, afebrile. Urine dipstick: not done.  ASSESSMENT:  Vaginal Discharge    PLAN:  GC, chlamydia, trichomonas, BVAG, CVAG probe sent to lab. Treatment: To be determined once lab results are received ROV prn if symptoms persist or worsen. Pt would like alternative antibiotic instead of metronidazole when results return.

## 2023-09-08 LAB — CERVICOVAGINAL ANCILLARY ONLY
Bacterial Vaginitis (gardnerella): NEGATIVE
Candida Glabrata: NEGATIVE
Candida Vaginitis: NEGATIVE
Chlamydia: NEGATIVE
Comment: NEGATIVE
Comment: NEGATIVE
Comment: NEGATIVE
Comment: NEGATIVE
Comment: NEGATIVE
Comment: NORMAL
Neisseria Gonorrhea: NEGATIVE
Trichomonas: POSITIVE — AB

## 2023-09-11 ENCOUNTER — Other Ambulatory Visit: Payer: Self-pay | Admitting: *Deleted

## 2023-09-11 DIAGNOSIS — A599 Trichomoniasis, unspecified: Secondary | ICD-10-CM

## 2023-09-11 MED ORDER — METRONIDAZOLE 500 MG PO TABS: 500.0000 mg | ORAL_TABLET | Freq: Two times a day (BID) | ORAL | 0 refills | Status: DC

## 2023-09-11 NOTE — Progress Notes (Signed)
 RX Flagyl for Trichomonas per protocol.

## 2023-10-25 ENCOUNTER — Ambulatory Visit (INDEPENDENT_AMBULATORY_CARE_PROVIDER_SITE_OTHER)

## 2023-10-25 ENCOUNTER — Other Ambulatory Visit (HOSPITAL_COMMUNITY)
Admission: RE | Admit: 2023-10-25 | Discharge: 2023-10-25 | Disposition: A | Discharge status: Home / Self Care | Source: Ambulatory Visit | Attending: Obstetrics and Gynecology | Admitting: Obstetrics and Gynecology

## 2023-10-25 VITALS — BP 111/74 | HR 74

## 2023-10-25 DIAGNOSIS — Z113 Encounter for screening for infections with a predominantly sexual mode of transmission: Secondary | ICD-10-CM | POA: Diagnosis present

## 2023-10-25 NOTE — Progress Notes (Signed)
 SUBJECTIVE:  38 y.o. female who desires a STI screen. Pt here for TOC after testing positive for Trichomonas on 3/27.  Denies abnormal vaginal discharge, bleeding or significant pelvic pain. No UTI symptoms. Denies history of known exposure to STD.  Patient's last menstrual period was 10/16/2023 (approximate).  OBJECTIVE:  She appears well.   ASSESSMENT:  STI Screen   PLAN:  Pt offered STI blood screening-not indicated GC, chlamydia, and trichomonas probe sent to lab.  Treatment: To be determined once lab results are received.  Pt follow up as needed.

## 2023-10-26 LAB — CERVICOVAGINAL ANCILLARY ONLY
Bacterial Vaginitis (gardnerella): NEGATIVE
Candida Glabrata: POSITIVE — AB
Candida Vaginitis: POSITIVE — AB
Chlamydia: NEGATIVE
Comment: NEGATIVE
Comment: NEGATIVE
Comment: NEGATIVE
Comment: NEGATIVE
Comment: NEGATIVE
Comment: NORMAL
Neisseria Gonorrhea: NEGATIVE
Trichomonas: NEGATIVE

## 2023-10-30 ENCOUNTER — Other Ambulatory Visit: Payer: Self-pay | Admitting: Infectious Diseases

## 2023-10-30 ENCOUNTER — Other Ambulatory Visit: Payer: Self-pay | Admitting: Obstetrics & Gynecology

## 2023-10-30 DIAGNOSIS — Z21 Asymptomatic human immunodeficiency virus [HIV] infection status: Secondary | ICD-10-CM

## 2023-11-01 ENCOUNTER — Other Ambulatory Visit: Payer: Self-pay

## 2023-11-01 ENCOUNTER — Ambulatory Visit: Payer: Self-pay | Admitting: Obstetrics and Gynecology

## 2023-11-01 DIAGNOSIS — B379 Candidiasis, unspecified: Secondary | ICD-10-CM

## 2023-11-01 MED ORDER — FLUCONAZOLE 150 MG PO TABS: 150.0000 mg | ORAL_TABLET | Freq: Once | ORAL | 0 refills | Status: AC

## 2023-11-02 ENCOUNTER — Telehealth: Payer: Self-pay

## 2023-11-02 NOTE — Telephone Encounter (Signed)
 Called patient. Advised that medication (diflucan ) was sent to pharmacy [facility] [address]. Pt verbalized understanding.

## 2023-11-03 ENCOUNTER — Telehealth: Payer: Self-pay

## 2023-11-03 NOTE — Telephone Encounter (Signed)
 Call placed to patient. Confirmed with patient that fluconazole  (Diflucan ) 150 mg was sent to her pharmacy. Pt states that she began having shooting vaginal pain that started last night. She also reports urinary frequency. She would like to be seen to rule out any type of urinary infection. No schedulers available at the time of call. Message sent to schedulers to contact patient for scheduling.

## 2023-11-03 NOTE — Telephone Encounter (Signed)
 Attempted to reach patient. No answer. VM full so unable to leave message. Will attempt at a later time.

## 2023-11-09 ENCOUNTER — Other Ambulatory Visit: Payer: Self-pay

## 2023-11-09 ENCOUNTER — Other Ambulatory Visit: Payer: Self-pay | Admitting: Infectious Diseases

## 2023-11-09 ENCOUNTER — Ambulatory Visit (INDEPENDENT_AMBULATORY_CARE_PROVIDER_SITE_OTHER): Payer: Medicaid Other | Admitting: Infectious Diseases

## 2023-11-09 ENCOUNTER — Encounter: Payer: Self-pay | Admitting: Infectious Diseases

## 2023-11-09 VITALS — BP 105/70 | HR 76 | Temp 98.0°F | Ht 65.0 in | Wt 223.0 lb

## 2023-11-09 DIAGNOSIS — Z21 Asymptomatic human immunodeficiency virus [HIV] infection status: Secondary | ICD-10-CM

## 2023-11-09 DIAGNOSIS — Z8619 Personal history of other infectious and parasitic diseases: Secondary | ICD-10-CM | POA: Diagnosis not present

## 2023-11-09 DIAGNOSIS — N760 Acute vaginitis: Secondary | ICD-10-CM | POA: Diagnosis not present

## 2023-11-09 MED ORDER — GENVOYA 150-150-200-10 MG PO TABS: 1.0000 | ORAL_TABLET | Freq: Every day | ORAL | 3 refills | Status: DC

## 2023-11-09 NOTE — Progress Notes (Signed)
 Name: Diamond Burton  DOB: [birth year] MRN: [id number] PCP: [facility]    Brief Narrative:  Diamond Burton is a 38 y.o. female with HIV disease, diagnosed 07/2021. CD4 1154 VL 34,100 HIV Risk: sexual History of OIs: none Intake Labs pending: Hep B sAg (-), sAb (-), cAb (-); Hep A (-), Hep C (-) Quantiferon (-) HLA B*5701 () G6PD: ()   Previous Regimens: Genvoya  2023  Genotypes: 07/2021 sensitive   Subjective:   Chief Complaint  Patient presents with   Follow-up    Discussed the use of AI scribe software for clinical note transcription with the patient, who gave verbal consent to proceed.  History of Present Illness   Diamond Burton is a 38 year old female who presents for routine follow up for HIV care. Requesting vaginal swab due to a history of yeast infections. She is accompanied by her child.  She has a history of vaginal yeast infections and BV.Aaron Aas She would like to obtain a vaginal swab to assess her current symptoms.  She is on Genvoya  for HIV management, which is going well. Her last blood work was conducted in September, and her viral load is well-controlled. She is committed to maintaining adherence to her medication regimen.  She frequently feels tired, attributing this to her busy schedule, including getting her children ready for school early in the morning. She wants to start exercising to help with fatigue and weight gain, as she wants to be in shape for her upcoming grandchild.   She is an occasional smoker. She is considering vaccines, including the hepatitis B and pneumonia vaccines. No current symptoms of pneumonia such as fever, productive cough, or chest pain.         11/09/2023    9:25 AM  Depression screen PHQ 2/9  Decreased Interest 2  Down, Depressed, Hopeless 1  PHQ - 2 Score 3  Altered sleeping 1  Tired, decreased energy 1  Change in appetite 1  Feeling bad or failure about yourself  1  Trouble concentrating 0  Moving  slowly or fidgety/restless 0  Suicidal thoughts 0  PHQ-9 Score 7  Difficult doing work/chores Somewhat difficult    Review of Systems  Constitutional:  Positive for fatigue.  All other systems reviewed and are negative.   Outpatient Medications Prior to Visit  Medication Sig Dispense Refill   clindamycin  (CLEOCIN  T) 1 % external solution Apply topically 2 (two) times daily.     ibuprofen  (ADVIL ) 800 MG tablet Take 1 tablet (800 mg total) by mouth every 8 (eight) hours as needed. 30 tablet 5   PARoxetine (PAXIL) 20 MG tablet Take 30 mg by mouth daily.     valACYclovir  (VALTREX ) 500 MG tablet TAKE 1 TABLET BY MOUTH TWICE A DAY 60 tablet 6   ziprasidone (GEODON) 20 MG capsule Take by mouth.     GENVOYA  150-150-200-10 MG TABS tablet TAKE 1 TABLET BY MOUTH EVERY DAY WITH BREAKFAST 90 tablet 0   metroNIDAZOLE  (FLAGYL ) 500 MG tablet Take 1 tablet (500 mg total) by mouth 2 (two) times daily. (Patient not taking: Reported on 11/09/2023) 14 tablet 0   metroNIDAZOLE  (FLAGYL ) 500 MG tablet Take 1 tablet (500 mg total) by mouth 2 (two) times daily. (Patient not taking: Reported on 11/09/2023) 14 tablet 0   metroNIDAZOLE  (FLAGYL ) 500 MG tablet Take 1 tablet (500 mg total) by mouth 2 (two) times daily. (Patient not taking: Reported on 11/09/2023) 14 tablet 0   No facility-administered medications prior to visit.  Allergies  Allergen Reactions   Latex     Social History   Tobacco Use   Smoking status: Some Days    Current packs/day: 0.50    Average packs/day: 0.5 packs/day for 2.0 years (1.0 ttl pk-yrs)    Types: Cigarettes    Passive exposure: Never   Smokeless tobacco: Never   Tobacco comments:    Smokes socially  Vaping Use   Vaping status: Never Used  Substance Use Topics   Alcohol use: Yes    Comment: socially   Drug use: No     Social History   Substance and Sexual Activity  Sexual Activity Yes   Partners: Male   Birth control/protection: None     Objective:    Vitals:   11/09/23 0854  BP: 105/70  Pulse: 76  Temp: 98 F (36.7 C)  TempSrc: Oral  SpO2: 97%  Weight: 223 lb (101.2 kg)  Height: 5\' 5"  (1.651 m)   Body mass index is 37.11 kg/m.  Physical Exam Constitutional:      Appearance: Normal appearance. She is not ill-appearing.  HENT:     Mouth/Throat:     Mouth: Mucous membranes are moist.     Pharynx: Oropharynx is clear.  Eyes:     General: No scleral icterus. Cardiovascular:     Rate and Rhythm: Normal rate.  Pulmonary:     Effort: Pulmonary effort is normal.  Neurological:     Mental Status: She is oriented to person, place, and time.  Psychiatric:        Mood and Affect: Mood normal.        Thought Content: Thought content normal.     Lab Results Lab Results  Component Value Date   WBC 9.5 03/02/2023   HGB 14.1 03/02/2023   HCT 42.5 03/02/2023   MCV 92.4 03/02/2023   PLT 374 03/02/2023    Lab Results  Component Value Date   CREATININE 0.70 03/02/2023   BUN 10 03/02/2023   NA 136 03/02/2023   K 4.3 03/02/2023   CL 103 03/02/2023   CO2 23 03/02/2023    Lab Results  Component Value Date   ALT 18 03/02/2023   AST 15 03/02/2023   ALKPHOS 80 03/11/2022   BILITOT 0.3 03/02/2023    Lab Results  Component Value Date   CHOL 189 03/02/2023   HDL 44 (L) 03/02/2023   LDLCALC 122 (H) 03/02/2023   TRIG 120 03/02/2023   CHOLHDL 4.3 03/02/2023   HIV 1 RNA Quant  Date Value  03/02/2023 NOT DETECTED copies/mL  04/27/2022 <20 Copies/mL (H)  01/04/2022 Not Detected Copies/mL   CD4 T Cell Abs (/uL)  Date Value  03/02/2023 1,692  01/04/2022 1,331     Assessment & Plan:     HIV infection - Well Controlled, VL < 20, CD4 > 500 -  HIV infection is well-controlled with current antiretroviral therapy. Viral load is undetectable, and immune system is strong and healthy with CD4 > 1000. Last blood work in September showed excellent results.  - Repeat viral load test today. - [facility] [address] [facility]  Vaginitis Symptoms - ?Yeast infection vs BV - Previously diagnosed twice with yeast infection. Current symptoms suggestive of yeast infection. - Perform vaginal swab to guide treatment.  - May benefit from Boric acid vaginal suppositories nightly x 5d if menses are a trigger   General Health Maintenance & Vaccine Counseling -  Discussion about vaccinations including hepatitis B  and pneumonia vaccines. She is not currently inclined towards vaccinations but is open to considering them in the future. - Provide information on hepatitis B and pneumonia vaccines for her to review. - Consider hepatitis B booster and pneumonia vaccine at future visit.   History of Syphilis -  Requesting repeat RPR today. No symptoms or known contacts.       Meds ordered this encounter  Medications   elvitegravir-cobicistat-emtricitabine-tenofovir (GENVOYA ) 150-150-200-10 MG TABS tablet    Sig: Take 1 tablet by mouth daily with breakfast.    Dispense:  90 tablet    Refill:  3    Prescription Type::   Renewal   Orders Placed This Encounter  Procedures   SureSwab Advanced Vaginitis Plus,TMA   HIV 1 RNA quant-no reflex-bld   RPR   T-helper cells (CD4) count   Return in about 6 months (around 05/11/2024).  Gibson Kurtz, MSN, NP-C [facility] for Infectious Disease [facility] Pager: [phone] Office: [phone]  11/09/23  9:47 AM

## 2023-11-09 NOTE — Patient Instructions (Addendum)
 Please continue your Genvoya  once a day with food.  Stop by the lab on your way out.  Will see you back in 6 months -  Will send you in something for the vaginal drainage based on the results.   Smoking Cessation: Quitline[place] 1-800-QUIT-NOW (239)173-3525); Espaol: 1-855-Djelo-Ya (1-828 087 5909) [email or web address]

## 2023-11-10 LAB — SURESWAB® ADVANCED VAGINITIS PLUS,TMA
C. trachomatis RNA, TMA: NOT DETECTED
CANDIDA SPECIES: NOT DETECTED
Candida glabrata: DETECTED — AB
N. gonorrhoeae RNA, TMA: NOT DETECTED
SURESWAB(R) ADV BACTERIAL VAGINOSIS(BV),TMA: NEGATIVE
TRICHOMONAS VAGINALIS (TV),TMA: NOT DETECTED

## 2023-11-11 LAB — T-HELPER CELLS (CD4) COUNT (NOT AT ARMC)
Absolute CD4: 1459 {cells}/uL (ref 490–1740)
CD4 T Helper %: 47 % (ref 30–61)
Total lymphocyte count: 3121 {cells}/uL (ref 850–3900)

## 2023-11-11 LAB — HIV-1 RNA QUANT-NO REFLEX-BLD
HIV 1 RNA Quant: NOT DETECTED {copies}/mL
HIV-1 RNA Quant, Log: NOT DETECTED {Log_copies}/mL

## 2023-11-11 LAB — RPR: RPR Ser Ql: NONREACTIVE

## 2023-11-13 ENCOUNTER — Ambulatory Visit

## 2023-11-15 ENCOUNTER — Ambulatory Visit: Payer: Self-pay | Admitting: Infectious Diseases

## 2023-12-28 ENCOUNTER — Other Ambulatory Visit (HOSPITAL_COMMUNITY)
Admission: RE | Admit: 2023-12-28 | Discharge: 2023-12-28 | Disposition: A | Discharge status: Home / Self Care | Source: Ambulatory Visit | Attending: Obstetrics and Gynecology | Admitting: Obstetrics and Gynecology

## 2023-12-28 ENCOUNTER — Encounter: Payer: Self-pay | Admitting: Obstetrics and Gynecology

## 2023-12-28 ENCOUNTER — Ambulatory Visit: Admitting: Obstetrics and Gynecology

## 2023-12-28 VITALS — BP 123/73 | HR 102 | Ht 66.0 in | Wt 225.0 lb

## 2023-12-28 DIAGNOSIS — B3731 Acute candidiasis of vulva and vagina: Secondary | ICD-10-CM | POA: Diagnosis not present

## 2023-12-28 DIAGNOSIS — R102 Pelvic and perineal pain: Secondary | ICD-10-CM | POA: Diagnosis present

## 2023-12-28 MED ORDER — FLUCONAZOLE 150 MG PO TABS: 150.0000 mg | ORAL_TABLET | ORAL | 1 refills | Status: AC

## 2023-12-28 NOTE — Progress Notes (Signed)
 Pt c/o pelvic pain for 1 week. Requesting vaginal swab today

## 2023-12-28 NOTE — Progress Notes (Signed)
   GYNECOLOGY PROBLEM  VISIT ENCOUNTER NOTE  Subjective:   Diamond Burton is a 38 y.o. [id number] female here for a problem GYN visit.  Current complaints: started to have intense pelvic pain ~2 weeks ago. Was on a trip to [place] and was evaluated there. Unable to obtain records, however per patient report, sounds like they treated her empirically for PID. Now no longer has pain but has had >3 yeast infections in past year. HIV+. Viral levels undetectable and have been for some time. Follows regularly with ID.   Gynecologic History No LMP recorded.  Contraception: condoms  Health Maintenance Due  Topic Date Due   COVID-19 Vaccine (1) Never done   Pneumococcal Vaccine 67-29 Years old (1 of 2 - PCV) Never done   Hepatitis B Vaccines (1 of 3 - 19+ 3-dose series) Never done   HPV VACCINES (1 - Risk 3-dose SCDM series) Never done    The following portions of the patient's history were reviewed and updated as appropriate: allergies, current medications, past family history, past medical history, past social history, past surgical history and problem list.  Review of Systems Pertinent items are noted in HPI.   Objective:  BP 123/73   Pulse (!) 102   Ht 5' 6 (1.676 m)   Wt 225 lb (102.1 kg)   BMI 36.32 kg/m   Gen: well appearing, NAD HEENT: no scleral icterus CV: RR Lung: Normal WOB Ext: warm well perfused  PELVIC: Normal appearing external genitalia  Chaperone present for exam.  Assessment and Plan:  1. Pelvic pain (Primary) Pelvic pain has since resolved. S/p suspected treatment for PID. Repeat wet prep, GC/C today as I do not have records from ER visit. Offered full pelvic exam today. Patient preferred only external exam which was reasonable given resolved symptoms. - Cervicovaginal ancillary only( [place])  2. Recurrent yeast infections Chronic, uncontrolled. In setting of suppressed HIV. Discussed increase in yeast infection risk with HIV. She is amenable to suppressive  therapy for 6 months and assess for recurrence after.  - fluconazole  (DIFLUCAN ) 150 MG tablet; Take 1 tablet (150 mg total) by mouth once a week.  Dispense: 90 tablet; Refill: 1  Please refer to After Visit Summary for other counseling recommendations.   Return in about 1 year (around 12/27/2024) for Annual.  Almarie CHRISTELLA Moats, MD

## 2023-12-29 LAB — CERVICOVAGINAL ANCILLARY ONLY
Bacterial Vaginitis (gardnerella): NEGATIVE
Candida Glabrata: NEGATIVE
Candida Vaginitis: NEGATIVE
Chlamydia: NEGATIVE
Comment: NEGATIVE
Comment: NEGATIVE
Comment: NEGATIVE
Comment: NEGATIVE
Comment: NEGATIVE
Comment: NORMAL
Neisseria Gonorrhea: NEGATIVE
Trichomonas: NEGATIVE

## 2023-12-30 ENCOUNTER — Ambulatory Visit: Payer: Self-pay | Admitting: Obstetrics and Gynecology

## 2024-02-20 ENCOUNTER — Ambulatory Visit (INDEPENDENT_AMBULATORY_CARE_PROVIDER_SITE_OTHER)

## 2024-02-20 ENCOUNTER — Other Ambulatory Visit (HOSPITAL_COMMUNITY)
Admission: RE | Admit: 2024-02-20 | Discharge: 2024-02-20 | Disposition: A | Discharge status: Home / Self Care | Source: Ambulatory Visit | Attending: Obstetrics and Gynecology | Admitting: Obstetrics and Gynecology

## 2024-02-20 VITALS — BP 115/75 | HR 63

## 2024-02-20 DIAGNOSIS — Z113 Encounter for screening for infections with a predominantly sexual mode of transmission: Secondary | ICD-10-CM

## 2024-02-20 NOTE — Progress Notes (Signed)
 SUBJECTIVE:  38 y.o. female who desires a STI screen. Denies abnormal vaginal discharge, bleeding or significant pelvic pain. No UTI symptoms. Denies history of known exposure to STD. She reports previously having Diamond Burton vaginal discharge, in which she used boric acid and it resolved. She reports a shooting pain in her vagina, as well as having had 2 cycles in a month for ~3 months.  LMP 02/05/24 (approximate).  OBJECTIVE:  She appears well.   ASSESSMENT:  STI Screen   PLAN:  Pt offered STI blood screening-not indicated GC, chlamydia, and trichomonas probe sent to lab.  Treatment: To be determined once lab results are received.  Advised pt to schedule a provider visit to further evaluate shooting pain in vagina as well as cycles. Pt follow up as needed.

## 2024-02-21 LAB — CERVICOVAGINAL ANCILLARY ONLY
Bacterial Vaginitis (gardnerella): NEGATIVE
Candida Glabrata: NEGATIVE
Candida Vaginitis: NEGATIVE
Chlamydia: NEGATIVE
Comment: NEGATIVE
Comment: NEGATIVE
Comment: NEGATIVE
Comment: NEGATIVE
Comment: NEGATIVE
Comment: NORMAL
Neisseria Gonorrhea: NEGATIVE
Trichomonas: NEGATIVE

## 2024-02-27 ENCOUNTER — Other Ambulatory Visit: Payer: Self-pay | Admitting: Obstetrics and Gynecology

## 2024-04-30 ENCOUNTER — Ambulatory Visit (INDEPENDENT_AMBULATORY_CARE_PROVIDER_SITE_OTHER)

## 2024-04-30 ENCOUNTER — Other Ambulatory Visit (HOSPITAL_COMMUNITY)
Admission: RE | Admit: 2024-04-30 | Discharge: 2024-04-30 | Disposition: A | Discharge status: Home / Self Care | Source: Ambulatory Visit | Attending: Obstetrics and Gynecology | Admitting: Obstetrics and Gynecology

## 2024-04-30 VITALS — BP 109/73 | HR 80 | Wt 230.1 lb

## 2024-04-30 DIAGNOSIS — Z113 Encounter for screening for infections with a predominantly sexual mode of transmission: Secondary | ICD-10-CM

## 2024-04-30 LAB — CERVICOVAGINAL ANCILLARY ONLY
Bacterial Vaginitis (gardnerella): POSITIVE — AB
Candida Glabrata: NEGATIVE
Candida Vaginitis: NEGATIVE
Chlamydia: NEGATIVE
Comment: NEGATIVE
Comment: NEGATIVE
Comment: NEGATIVE
Comment: NEGATIVE
Comment: NEGATIVE
Comment: NORMAL
Neisseria Gonorrhea: NEGATIVE
Trichomonas: NEGATIVE

## 2024-04-30 NOTE — Progress Notes (Signed)
..  SUBJECTIVE:  38 y.o. female is in the office for STD Screening Denies abnormal vaginal bleeding or significant pelvic pain or fever. No UTI symptoms. Denies history of known exposure to STD.  No LMP recorded.  OBJECTIVE:  She appears well, afebrile. Urine dipstick: not done.  ASSESSMENT:  STD Screening   PLAN:  GC, chlamydia, trichomonas, BVAG, CVAG probe sent to lab. Treatment: To be determined once lab results are received ROV prn if symptoms persist or worsen.

## 2024-05-01 ENCOUNTER — Ambulatory Visit: Payer: Self-pay | Admitting: Obstetrics and Gynecology

## 2024-05-20 ENCOUNTER — Other Ambulatory Visit: Payer: Self-pay

## 2024-05-20 MED ORDER — METRONIDAZOLE 500 MG PO TABS: 500.0000 mg | ORAL_TABLET | Freq: Two times a day (BID) | ORAL | 0 refills | Status: AC

## 2024-05-23 ENCOUNTER — Encounter: Payer: Self-pay | Admitting: Infectious Diseases

## 2024-05-23 ENCOUNTER — Ambulatory Visit: Admitting: Infectious Diseases

## 2024-05-23 ENCOUNTER — Other Ambulatory Visit: Payer: Self-pay

## 2024-05-23 VITALS — BP 121/70 | HR 76 | Temp 96.8°F | Ht 66.0 in | Wt 230.0 lb

## 2024-05-23 DIAGNOSIS — N761 Subacute and chronic vaginitis: Secondary | ICD-10-CM

## 2024-05-23 DIAGNOSIS — L732 Hidradenitis suppurativa: Secondary | ICD-10-CM | POA: Diagnosis not present

## 2024-05-23 DIAGNOSIS — Z21 Asymptomatic human immunodeficiency virus [HIV] infection status: Secondary | ICD-10-CM

## 2024-05-23 DIAGNOSIS — B9689 Other specified bacterial agents as the cause of diseases classified elsewhere: Secondary | ICD-10-CM | POA: Diagnosis not present

## 2024-05-23 DIAGNOSIS — A6000 Herpesviral infection of urogenital system, unspecified: Secondary | ICD-10-CM | POA: Diagnosis not present

## 2024-05-23 MED ORDER — VALACYCLOVIR HCL 500 MG PO TABS: 1000.0000 mg | ORAL_TABLET | Freq: Every day | ORAL | 11 refills | Status: AC

## 2024-05-23 NOTE — Progress Notes (Unsigned)
 Patient: Diamond Burton  DOB: 1986/02/12 MRN: [id number] PCP: [facility]  Referring Provider:   Reason for Visit: 56-month follow up  Chief Complaint  Patient presents with   Follow-up    B20      Subjective   Subjective:   Diamond Burton is a 38 year old female presenting to clinic for her 94-month follow up. Last seen on 11/09/2023, she reports no recent hospitalizations or ER visits. She reports being prophylactically being treated for yeast infection by taking weekly diflucan  for 6 months. During today's visit, she denies vaginal discharge or discomfort. Current regimen includes daily Genvoya  and reports daily adherence, no missed doses or adverse effects. She reports no barriers in obtaining medication from pharmacy. Last viral load was undetectable and CD4 1459. She reports not working and will get on disability soon. She reports table housing, food and transportation. She reports occasional smoking and drinking and denies illegal substance use. She reports not being sexually active. She denies fever, chills, night sweats, chest pain, shortness of breath, lesions or rashes.   Review of Systems  Constitutional: Negative.   Respiratory: Negative.    Cardiovascular: Negative.   Gastrointestinal: Negative.   Genitourinary: Negative.   Musculoskeletal: Negative.   Skin: Negative.   Neurological: Negative.   Psychiatric/Behavioral: Negative.      Past Medical History:  Diagnosis Date   Abnormal Pap smear    Bipolar disorder (HCC)    not taking meds due to preg   Chlamydia infection affecting pregnancy in first trimester 10/10/2018   Depression    stopped meds, was wanting to see counselor. 11/8 has been difficult since off meds   Eczema    Erb's palsy    L arm   Erb's palsy    left arm   Gonorrhea affecting pregnancy in first trimester 10/10/2018   Group B streptococcal bacteriuria 10/18/2018   <1K colonies: Will need intrapartum prophylaxis    Herpes    frequent outbreaks on valtrex  500   History of chlamydia    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   left   History of gonorrhea    Hydradenitis    Infection    UTI   Kidney stones    Ovarian cyst    Periodontal disease    PID (pelvic inflammatory disease)    PTSD (post-traumatic stress disorder)    Trichomonas contact     Outpatient Medications Prior to Visit  Medication Sig Dispense Refill   clindamycin  (CLEOCIN  T) 1 % external solution Apply topically 2 (two) times daily.     fluconazole  (DIFLUCAN ) 150 MG tablet Take 1 tablet (150 mg total) by mouth once a week. 90 tablet 1   ibuprofen  (ADVIL ) 800 MG tablet Take 1 tablet (800 mg total) by mouth every 8 (eight) hours as needed. 30 tablet 5   metroNIDAZOLE  (FLAGYL ) 500 MG tablet Take 1 tablet (500 mg total) by mouth 2 (two) times daily for 7 days. 14 tablet 0   PARoxetine (PAXIL) 20 MG tablet Take 30 mg by mouth daily.     ziprasidone (GEODON) 20 MG capsule Take by mouth.     elvitegravir-cobicistat-emtricitabine-tenofovir (GENVOYA ) 150-150-200-10 MG TABS tablet Take 1 tablet by mouth daily with breakfast. 90 tablet 3   valACYclovir  (VALTREX ) 500 MG tablet TAKE 1 TABLET BY MOUTH TWICE A DAY 60 tablet 6   No facility-administered medications prior to visit.     Allergies[1]  Social History[2]  Family History  Problem Relation Age of Onset  Depression Mother    Diabetes Mother    Hypertension Mother    Heart disease Mother    Asthma Sister    Asthma Daughter    Hypertension Maternal Grandmother    Diabetes Maternal Grandmother        Objective   Objective:   Vitals:   05/23/24 1113  BP: 121/70  Pulse: 76  Temp: (!) 96.8 F (36 C)  TempSrc: Temporal  SpO2: 97%  Weight: 230 lb (104.3 kg)  Height: 5' 6 (1.676 m)   Body mass index is 37.12 kg/m.  Physical Exam Vitals and nursing note reviewed.  Constitutional:      Appearance: Normal appearance.  Cardiovascular:     Rate and Rhythm: Normal  rate and regular rhythm.     Heart sounds: Normal heart sounds.  Pulmonary:     Effort: Pulmonary effort is normal.     Breath sounds: Normal breath sounds.  Musculoskeletal:        General: Normal range of motion.  Neurological:     Mental Status: She is alert and oriented to person, place, and time.  Psychiatric:        Mood and Affect: Mood normal.        Behavior: Behavior normal.        Thought Content: Thought content normal.        Judgment: Judgment normal.        Assessment & Plan:   Problem List Items Addressed This Visit       Other   HIV (human immunodeficiency virus infection) (HCC) - Primary   Relevant Medications   elvitegravir-cobicistat-emtricitabine-tenofovir (GENVOYA ) 150-150-200-10 MG TABS tablet   valACYclovir  (VALTREX ) 500 MG tablet   Other Relevant Orders   HIV 1 RNA quant-no reflex-bld   T-helper cells (CD4) count   Comp Met (CMET)   CBC w/Diff   Other Visit Diagnoses       HIV test positive (HCC)       Relevant Medications   elvitegravir-cobicistat-emtricitabine-tenofovir (GENVOYA ) 150-150-200-10 MG TABS tablet   valACYclovir  (VALTREX ) 500 MG tablet       Assessment and Plan  -HIV (human immunodeficiency virus infection) Current regimen includes daily Genvoya . She reports daily adherence, no missed doses or adverse effects. She reports no barriers in obtaining medication from pharmacy. Last viral load was undetectable and CD4 1459. Will send medication refills today and will obtain HIV-1 RNA, CD4, CMP, and CBC. Continue with current regimen.   -Healthcare maintenance  She reports not being sexually active at this time and declines STI screening today. She denies vaginal discharge or discomfort, dysuria, lesions or rashes in the genital area. Preventive care reviewed and reports having a pap smear this year. Mammogram at 38 years of age. Immunizations reviewed and declines all vaccinations at this time.     Orders Placed This Encounter   Procedures   HIV 1 RNA quant-no reflex-bld   T-helper cells (CD4) count   Comp Met (CMET)   CBC w/Diff    Meds ordered this encounter  Medications   metroNIDAZOLE  (METROGEL ) 1 % gel    Sig: Apply topically daily.    Dispense:  45 g    Refill:  3   elvitegravir-cobicistat-emtricitabine-tenofovir (GENVOYA ) 150-150-200-10 MG TABS tablet    Sig: Take 1 tablet by mouth daily with breakfast.    Dispense:  90 tablet    Refill:  3    Prescription Type::   Renewal   valACYclovir  (VALTREX ) 500 MG tablet  Sig: Take 2 tablets (1,000 mg total) by mouth daily.    Dispense:  60 tablet    Refill:  11    Return in about 6 months (around 11/21/2024).   Corean Fireman, MSN, NP-C [facility]  [email or web address] Pager: [phone] Office: [phone] [facility] Main Line: [phone] *Secure Chat Communication Welcome      [1]  Allergies Allergen Reactions   Latex   [2]  Social History Tobacco Use   Smoking status: Some Days    Current packs/day: 0.50    Average packs/day: 0.5 packs/day for 2.0 years (1.0 ttl pk-yrs)    Types: Cigarettes    Passive exposure: Never   Smokeless tobacco: Never   Tobacco comments:    Smokes socially  Vaping Use   Vaping status: Never Used  Substance Use Topics   Alcohol use: Yes    Comment: socially   Drug use: No

## 2024-05-23 NOTE — Progress Notes (Unsigned)
 Name: Diamond Burton  DOB: 1986/04/11 MRN: [id number] PCP: [facility]    Brief Narrative:  Diamond Burton is a 38 y.o. female with HIV disease, diagnosed 07/2021. CD4 1154 VL 34,100 HIV Risk: sexual History of OIs: none Intake Labs pending: Hep B sAg (-), sAb (-), cAb (-); Hep A (-), Hep C (-) Quantiferon (-) HLA B*5701 () G6PD: ()   Previous Regimens: Genvoya  2023  Genotypes: 07/2021 sensitive   Subjective:   Chief Complaint  Patient presents with   Follow-up    B20    Discussed the use of AI scribe software for clinical note transcription with the patient, who gave verbal consent to proceed.  History of Present Illness             11/09/2023    9:25 AM  Depression screen PHQ 2/9  Decreased Interest 2  Down, Depressed, Hopeless 1  PHQ - 2 Score 3  Altered sleeping 1  Tired, decreased energy 1  Change in appetite 1  Feeling bad or failure about yourself  1  Trouble concentrating 0  Moving slowly or fidgety/restless 0  Suicidal thoughts 0  PHQ-9 Score 7   Difficult doing work/chores Somewhat difficult     Data saved with a previous flowsheet row definition    Review of Systems  Constitutional:  Positive for fatigue.  All other systems reviewed and are negative.   Outpatient Medications Prior to Visit  Medication Sig Dispense Refill   clindamycin  (CLEOCIN  T) 1 % external solution Apply topically 2 (two) times daily.     elvitegravir-cobicistat-emtricitabine-tenofovir (GENVOYA ) 150-150-200-10 MG TABS tablet Take 1 tablet by mouth daily with breakfast. 90 tablet 3   fluconazole  (DIFLUCAN ) 150 MG tablet Take 1 tablet (150 mg total) by mouth once a week. 90 tablet 1   ibuprofen  (ADVIL ) 800 MG tablet Take 1 tablet (800 mg total) by mouth every 8 (eight) hours as needed. 30 tablet 5   metroNIDAZOLE  (FLAGYL ) 500 MG tablet Take 1 tablet (500 mg total) by mouth 2 (two) times daily for 7 days. 14 tablet 0   PARoxetine (PAXIL) 20 MG  tablet Take 30 mg by mouth daily.     valACYclovir  (VALTREX ) 500 MG tablet TAKE 1 TABLET BY MOUTH TWICE A DAY 60 tablet 6   ziprasidone (GEODON) 20 MG capsule Take by mouth.     No facility-administered medications prior to visit.     Allergies  Allergen Reactions   Latex     Social History   Tobacco Use   Smoking status: Some Days    Current packs/day: 0.50    Average packs/day: 0.5 packs/day for 2.0 years (1.0 ttl pk-yrs)    Types: Cigarettes    Passive exposure: Never   Smokeless tobacco: Never   Tobacco comments:    Smokes socially  Vaping Use   Vaping status: Never Used  Substance Use Topics   Alcohol use: Yes    Comment: socially   Drug use: No     Social History   Substance and Sexual Activity  Sexual Activity Yes   Partners: Male   Birth control/protection: None   Comment: accepted condoms     Objective:   Vitals:   05/23/24 1113  BP: 121/70  Pulse: 76  Temp: (!) 96.8 F (36 C)  TempSrc: Temporal  SpO2: 97%  Weight: 230 lb (104.3 kg)  Height: 5' 6 (1.676 m)   Body mass index is 37.12 kg/m.  Physical Exam Constitutional:  Appearance: Normal appearance. She is not ill-appearing.  HENT:     Mouth/Throat:     Mouth: Mucous membranes are moist.     Pharynx: Oropharynx is clear.  Eyes:     General: No scleral icterus. Cardiovascular:     Rate and Rhythm: Normal rate.  Pulmonary:     Effort: Pulmonary effort is normal.  Neurological:     Mental Status: She is oriented to person, place, and time.  Psychiatric:        Mood and Affect: Mood normal.        Thought Content: Thought content normal.     Lab Results Lab Results  Component Value Date   WBC 9.5 03/02/2023   HGB 14.1 03/02/2023   HCT 42.5 03/02/2023   MCV 92.4 03/02/2023   PLT 374 03/02/2023    Lab Results  Component Value Date   CREATININE 0.70 03/02/2023   BUN 10 03/02/2023   NA 136 03/02/2023   K 4.3 03/02/2023   CL 103 03/02/2023   CO2 23  03/02/2023    Lab Results  Component Value Date   ALT 18 03/02/2023   AST 15 03/02/2023   ALKPHOS 80 03/11/2022   BILITOT 0.3 03/02/2023    Lab Results  Component Value Date   CHOL 189 03/02/2023   HDL 44 (L) 03/02/2023   LDLCALC 122 (H) 03/02/2023   TRIG 120 03/02/2023   CHOLHDL 4.3 03/02/2023   HIV 1 RNA Quant  Date Value  11/09/2023 NOT DETECTED copies/mL  03/02/2023 NOT DETECTED copies/mL  04/27/2022 <20 Copies/mL (H)   CD4 T Cell Abs (/uL)  Date Value  03/02/2023 1,692  01/04/2022 1,331     Assessment & Plan:             No orders of the defined types were placed in this encounter.  No orders of the defined types were placed in this encounter.  No follow-ups on file.  Corean Fireman, MSN, NP-C [facility] Pager: [phone] Office: [phone]  05/23/2024  11:29 AM

## 2024-05-23 NOTE — Progress Notes (Unsigned)
B20

## 2024-05-23 NOTE — Patient Instructions (Addendum)
° ° °  When you need to take antibiotics for HS flare - take either one of these products to see if it helps with yeast infections AND start the metrogel  once a night while you are on antibiotics to see if it helps prevent vaginitis symptoms

## 2024-05-24 LAB — T-HELPER CELLS (CD4) COUNT (NOT AT ARMC)
CD4 % Helper T Cell: 50 % (ref 33–65)
CD4 T Cell Abs: 1577 /uL (ref 400–1790)

## 2024-05-24 NOTE — Addendum Note (Signed)
 Addended by: MELVENIA COREAN SAILOR on: 05/24/2024 10:11 AM   Modules accepted: Level of Service

## 2024-05-25 LAB — CBC WITH DIFFERENTIAL/PLATELET
Absolute Lymphocytes: 3510 {cells}/uL (ref 850–3900)
Absolute Monocytes: 702 {cells}/uL (ref 200–950)
Basophils Absolute: 78 {cells}/uL (ref 0–200)
Basophils Relative: 1 %
Eosinophils Absolute: 94 {cells}/uL (ref 15–500)
Eosinophils Relative: 1.2 %
HCT: 42.3 % (ref 35.9–46.0)
Hemoglobin: 14 g/dL (ref 11.7–15.5)
MCH: 30.2 pg (ref 27.0–33.0)
MCHC: 33.1 g/dL (ref 31.6–35.4)
MCV: 91.4 fL (ref 81.4–101.7)
MPV: 10.7 fL (ref 7.5–12.5)
Monocytes Relative: 9 %
Neutro Abs: 3416 {cells}/uL (ref 1500–7800)
Neutrophils Relative %: 43.8 %
Platelets: 279 Thousand/uL (ref 140–400)
RBC: 4.63 Million/uL (ref 3.80–5.10)
RDW: 13.4 % (ref 11.0–15.0)
Total Lymphocyte: 45 %
WBC: 7.8 Thousand/uL (ref 3.8–10.8)

## 2024-05-25 LAB — COMPREHENSIVE METABOLIC PANEL WITH GFR
AG Ratio: 1.2 (calc) (ref 1.0–2.5)
ALT: 14 U/L (ref 6–29)
AST: 12 U/L (ref 10–30)
Albumin: 4.1 g/dL (ref 3.6–5.1)
Alkaline phosphatase (APISO): 91 U/L (ref 31–125)
BUN: 10 mg/dL (ref 7–25)
CO2: 26 mmol/L (ref 20–32)
Calcium: 9.3 mg/dL (ref 8.6–10.2)
Chloride: 103 mmol/L (ref 98–110)
Creat: 0.84 mg/dL (ref 0.50–0.97)
Globulin: 3.3 g/dL (ref 1.9–3.7)
Glucose, Bld: 87 mg/dL (ref 65–99)
Potassium: 3.9 mmol/L (ref 3.5–5.3)
Sodium: 137 mmol/L (ref 135–146)
Total Bilirubin: 0.3 mg/dL (ref 0.2–1.2)
Total Protein: 7.4 g/dL (ref 6.1–8.1)
eGFR: 91 mL/min/1.73m2 (ref >=60)

## 2024-05-25 LAB — HIV-1 RNA QUANT-NO REFLEX-BLD
HIV 1 RNA Quant: NOT DETECTED {copies}/mL
HIV-1 RNA Quant, Log: NOT DETECTED {Log_copies}/mL

## 2024-05-27 ENCOUNTER — Ambulatory Visit: Payer: Self-pay | Admitting: Infectious Diseases

## 2024-11-11 ENCOUNTER — Ambulatory Visit: Admitting: Infectious Diseases

## 2024-12-03 ENCOUNTER — Ambulatory Visit: Payer: Self-pay | Admitting: Infectious Diseases
